# Patient Record
Sex: Male | Born: 1935 | Race: White | Hispanic: No | Marital: Married | Smoking: Never smoker
Health system: Southern US, Community
[De-identification: ages and names within clinical notes are randomized; demographics above are authoritative.]

## PROBLEM LIST (undated history)

## (undated) DIAGNOSIS — J449 Chronic obstructive pulmonary disease, unspecified: Secondary | ICD-10-CM

## (undated) DIAGNOSIS — J479 Bronchiectasis, uncomplicated: Secondary | ICD-10-CM

---

## 1997-08-27 HISTORY — PX: CORONARY STENT PLACEMENT: SHX1402

## 1998-05-07 ENCOUNTER — Encounter: Payer: Self-pay | Admitting: Cardiovascular Disease

## 1998-05-07 ENCOUNTER — Inpatient Hospital Stay: Admission: RE | Admit: 1998-05-07 | Discharge: 1998-05-09 | Payer: Self-pay | Admitting: Cardiovascular Disease

## 1998-07-25 ENCOUNTER — Ambulatory Visit (HOSPITAL_COMMUNITY): Admission: RE | Admit: 1998-07-25 | Discharge: 1998-07-25 | Payer: Self-pay | Admitting: Gastroenterology

## 1998-08-27 HISTORY — PX: CHOLECYSTECTOMY: SHX55

## 2000-07-02 ENCOUNTER — Encounter (INDEPENDENT_AMBULATORY_CARE_PROVIDER_SITE_OTHER): Payer: Self-pay

## 2000-07-02 ENCOUNTER — Ambulatory Visit (HOSPITAL_COMMUNITY): Admission: RE | Admit: 2000-07-02 | Discharge: 2000-07-02 | Payer: Self-pay | Admitting: Gastroenterology

## 2007-09-15 ENCOUNTER — Ambulatory Visit: Payer: Self-pay | Admitting: Internal Medicine

## 2008-06-11 ENCOUNTER — Ambulatory Visit: Payer: Self-pay | Admitting: Internal Medicine

## 2008-06-14 ENCOUNTER — Inpatient Hospital Stay (HOSPITAL_COMMUNITY): Admission: EM | Admit: 2008-06-14 | Discharge: 2008-06-15 | Payer: Self-pay | Admitting: Emergency Medicine

## 2008-06-24 ENCOUNTER — Observation Stay: Payer: Self-pay | Admitting: Internal Medicine

## 2008-08-02 ENCOUNTER — Inpatient Hospital Stay: Payer: Self-pay | Admitting: Internal Medicine

## 2008-12-20 ENCOUNTER — Ambulatory Visit: Payer: Self-pay | Admitting: Internal Medicine

## 2010-04-03 ENCOUNTER — Ambulatory Visit: Payer: Self-pay | Admitting: Internal Medicine

## 2010-09-04 ENCOUNTER — Ambulatory Visit: Payer: Self-pay | Admitting: Otolaryngology

## 2010-09-07 ENCOUNTER — Ambulatory Visit: Payer: Self-pay | Admitting: Otolaryngology

## 2010-09-09 LAB — PATHOLOGY REPORT

## 2011-01-09 NOTE — Cardiovascular Report (Signed)
NAME:  George Hensley, WILLCUTT NO.:  [id number]   MEDICAL RECORD NO.:  [id number]          PATIENT TYPE:  INP   LOCATION:  3739                         FACILITY:  [facility]   PHYSICIAN:  Nicki Guadalajara, M.D.     DATE OF BIRTH:  [birth year]   DATE OF PROCEDURE:  06/14/2008  DATE OF DISCHARGE:                            CARDIAC CATHETERIZATION   HISTORY:  Scout Gumbs is a 77 year old patient of Dr. Freda Munro,  who in 1999 underwent stenting of his proximal LAD.  At that time,  presenting symptoms were that of predominant shortness of breath.  He  does have a history of type 2 diabetes mellitus, hypertension,  dyslipidemia, and recently saw Dr. Freda Munro, was treated for  URI/bronchitis.  The patient has noticed progressive exertional dyspnea  suggestive of his anginal equivalent.  He saw Dr. Mariah Milling on Friday in  our [place] office after having seen Dr. Welton Flakes the day previously.  At  that time, he was started on low dose nitrates.  He presents to the  hospital today for cardiac catheterization.   PROCEDURE:  After premedication with Valium 5 mg intravenous, the  patient was prepped and draped in usual fashion.  His right femoral  artery was punctured anteriorly and a 5-French sheath was inserted.  Diagnostic catheterization was done utilizing 5-French Judkins for left  and right coronary catheters.  A 200 mcg intracoronary nitroglycerin was  administered down the left coronary system.  A pigtail catheter was used  for biplane selective coronary arteriography as well as distal  aortography.  Hemostasis was obtained by direct manual pressure.  The  patient tolerated the procedure well.   HEMODYNAMIC DATA:  Central aortic pressure is 115/59.  Left ventricular  pressure is 115/40.   ANGIOGRAPHIC DATA:  Left main coronary artery was angiographically  normal, bifurcated into an LAD and left circumflex system.   The proximal LAD had smooth narrowing of 20% before the first  septal  perforating artery.  There is a widely patent stent in the LAD between  the septal perforating artery and ending proximal to a small diagonal  branch.  There was 80%-90% mid stenosis in the small diagonal vessel  which was at most 2.0-2.1 mm in size.  The remainder of the LAD was free  of significant disease.   The circumflex vessel gave rise to a very small first high marginal  vessel which almost had a ramus intermediate like distribution.  This  was small-caliber, had 70% proximal stenosis.  The circumflex gave rise  to two additional marginal vessels which were free of significant  disease.   The right coronary artery was moderate sized vessel that had 50% focal  narrowing before anterior RV marginal branch.  The RCA ended in the PDA,  inferior LV branch, and posterolateral vessel.   Biplane selective coronary arteriography revealed preserved global  contractility.  However, there was definite mitral valve prolapse.  In  addition, there was focal area of mid-to-basal inferior  hypocontractility on the RAO projection and hypocontractility involving  the low inferolateral wall near the apical inferolateral wall.  Distal aortography revealed a tortuous infrarenal aorta without evidence  of renal artery stenosis or significant obstructive disease.   IMPRESSION:  1. Preserved global contractility, but with definite focal mid-to-      basal inferior hypocontractility as well as apical inferolateral      hypocontractility and evidence for angiographic mitral valve      prolapse.  2. A 20% narrowing in the proximal left anterior descending with      evidence for widely patent stent previously placed in 1999 in the      left anterior descending between the septal and diagonal vessel      with new 80%-90% stenosis in the midportion of a small diagonal      vessel of the left anterior descending.  3. A 70% narrowing in a small high marginal branch of the left      circumflex  system.  4. A 50% narrowing in a large right coronary artery.   RECOMMENDATIONS:  Mr. Mazor will be treated with initial increased  medical therapy.  Once significantly titrated, he will then undergo  nuclear stress testing to assess potential for ongoing ischemia on  medical therapy.           ______________________________  Nicki Guadalajara, M.D.     TK/MEDQ  D:  06/14/2008  T:  06/15/2008  Job:  [id number]   cc:   Gean Maidens, MD

## 2011-01-12 NOTE — Discharge Summary (Signed)
NAME:  George Hensley, George Hensley NO.:  [id number]   MEDICAL RECORD NO.:  [id number]          PATIENT TYPE:  INP   LOCATION:  3739                         FACILITY:  [facility]   PHYSICIAN:  Nicki Guadalajara, M.D.     DATE OF BIRTH:  [birth year]   DATE OF ADMISSION:  06/14/2008  DATE OF DISCHARGE:  06/15/2008                               DISCHARGE SUMMARY   DISCHARGE DIAGNOSES:  1. Dyspnea on exertion, worrisome for anginal equivalent,      catheterization on this admission revealing distal diagonal      disease, to be treated medically with a patent left anterior      descending stent.  2. Known coronary disease with prior left anterior descending stenting      in 1999.  3. Resolving upper respiratory infection.  4. Non-insulin-dependent diabetes.  5. Treated dyslipidemia.  6. Treated hypertension with normal renal arteries.   HOSPITAL COURSE:  The patient is a 77 year old male who had seen Dr.  Mariah Milling as an outpatient on June 11, 2008.  She had been having  dyspnea on exertion that was worrisome for anginal equivalent.  She also  had an abnormal EKG with new poor anterior R-wave progression.  He was  admitted for catheterization and further evaluation.  He was having  continued symptoms from an episode of bronchitis.  We added Advair and  Xopenex.  Nitrates and Plavix have been added on June 11, 2008.  He  was cathed on June 14, 2008, which revealed a 50% RCA, 70% small  ramus, patent LAD stent with a 80-90% small second diagonal.  LV  function was preserved with mitral valve prolapse noted.  There was  normal renal arteries and normal iliacs.  Plan was for continued medical  therapy and outpatient echo and Myoview and then a followup with Dr.  Tresa Endo.  Dr. Tresa Endo felt the patient could be discharged on June 15, 2008.   LABORATORY DATA:  EKG shows sinus rhythm without acute changes.  White  count 5.9, hemoglobin 12.5, hematocrit 37.4, platelets 130.  INR 1.0.  Sodium 129, potassium 4.1, BUN 20, creatinine 0.9.  Liver functions are  normal except for a slightly elevated bilirubin of 1.8.  Hemoglobin A1c  was 8.4.  CK-MB and troponin are negative x2.  Lipids show a cholesterol  of 141, HDL 70, LDL 60.   DISCHARGE MEDICATIONS:  1. Aspirin 325 mg a day.  2. She will continue her prednisone taper as directed.  3. She will continue her azithromycin as directed.  4. Simvastatin 20 mg a day.  5. Diltiazem, as taken at home, 120 mg a day.  6. Glipizide 10 mg a day.  7. Flomax 0.4 mg a day.  8. Finasteride 5 mg a day.  9. Advair Diskus 250/50 b.i.d.  10.Altace 2.5 mg a day.  11.Imdur 60 mg a day.  12.Toprol-XL 25 mg a day.  13.Nitroglycerin sublingual p.r.n.   DISPOSITION:  The patient is discharged in stable condition and will  follow up with Dr. Tresa Endo after an outpatient echocardiogram and Myoview.      Franky Macho  Leonor Liv, P.A.    ______________________________  Nicki Guadalajara, M.D.    Lenard Lance  D:  08/12/2008  T:  08/12/2008  Job:  [id number]   cc:   Freda Munro

## 2011-02-22 ENCOUNTER — Institutional Professional Consult (permissible substitution): Payer: Self-pay | Admitting: Internal Medicine

## 2011-03-05 ENCOUNTER — Institutional Professional Consult (permissible substitution): Payer: Self-pay | Admitting: Internal Medicine

## 2011-03-06 ENCOUNTER — Ambulatory Visit (INDEPENDENT_AMBULATORY_CARE_PROVIDER_SITE_OTHER): Payer: Medicare Other | Admitting: Internal Medicine

## 2011-03-06 ENCOUNTER — Ambulatory Visit (INDEPENDENT_AMBULATORY_CARE_PROVIDER_SITE_OTHER)
Admission: RE | Admit: 2011-03-06 | Discharge: 2011-03-06 | Disposition: A | Discharge status: Home / Self Care | Payer: Medicare Other | Source: Ambulatory Visit | Attending: Internal Medicine | Admitting: Internal Medicine

## 2011-03-06 ENCOUNTER — Encounter: Payer: Self-pay | Admitting: Internal Medicine

## 2011-03-06 ENCOUNTER — Telehealth: Payer: Self-pay | Admitting: *Deleted

## 2011-03-06 VITALS — BP 108/66 | HR 74 | Temp 98.1°F | Ht 71.0 in | Wt 179.6 lb

## 2011-03-06 DIAGNOSIS — J984 Other disorders of lung: Secondary | ICD-10-CM

## 2011-03-06 DIAGNOSIS — R059 Cough, unspecified: Secondary | ICD-10-CM

## 2011-03-06 DIAGNOSIS — R05 Cough: Secondary | ICD-10-CM

## 2011-03-06 DIAGNOSIS — R911 Solitary pulmonary nodule: Secondary | ICD-10-CM | POA: Insufficient documentation

## 2011-03-06 MED ORDER — AMOXICILLIN-POT CLAVULANATE 875-125 MG PO TABS: 1.0000 | ORAL_TABLET | Freq: Two times a day (BID) | ORAL | Status: AC

## 2011-03-06 MED ORDER — PREDNISONE (PAK) 10 MG PO TABS: ORAL_TABLET | ORAL | Status: AC

## 2011-03-06 MED ORDER — MOMETASONE FURO-FORMOTEROL FUM 100-5 MCG/ACT IN AERO: 2.0000 | INHALATION_SPRAY | Freq: Two times a day (BID) | RESPIRATORY_TRACT | Status: DC

## 2011-03-06 NOTE — Telephone Encounter (Signed)
LUL pulmonary nodule.  Nonspecific in appearance.  Recommend further evaluation with CT of the chest.

## 2011-03-06 NOTE — Progress Notes (Signed)
Subjective:     Patient ID: George Hensley, male   DOB: 18-Apr-1931, 77 y.o.   MRN: [id number]  HPI  58 yowm never smoked with onset of short of breath assoc with sinus drainage and infections starting around early 2000's referred to this clinic by Dr Chestine Spore, ENT [facility]   03/06/2011 ov/ George Hensley cc cough x one year gradually worse, day more night and does not wake up with it,  A lot better p sinus surgery for about a month then gradually worse since in terms of frequency and severity.  Sputum is light yellow.  No better on advair, spiriva, dulera.  Mild doe yardwork in hot weather.   Sleeping ok without nocturnal  or early am exac of resp c/o's or need for noct saba. Pt denies any significant sore throat, dysphagia, itching, sneezing,  nasal congestion or excess/ purulent secretions,  fever, chills, sweats, unintended wt loss, pleuritic or exertional cp, hempoptysis, orthopnea pnd or leg swelling.    Also denies any obvious fluctuation of symptoms with weather or environmental changes or other aggravating or alleviating factors.    Review of Systems  Constitutional: Negative for fever, chills, activity change, appetite change and unexpected weight change.  HENT: Negative for congestion, sore throat, rhinorrhea, sneezing, trouble swallowing, dental problem, voice change and postnasal drip.   Eyes: Negative for visual disturbance.  Respiratory: Positive for cough and shortness of breath. Negative for choking.   Cardiovascular: Negative for chest pain and leg swelling.  Gastrointestinal: Negative for nausea, vomiting and abdominal pain.  Genitourinary: Negative for difficulty urinating.  Musculoskeletal: Negative for arthralgias.  Skin: Negative for rash.  Psychiatric/Behavioral: Negative for behavioral problems and confusion.       Objective:   Physical Exam amb wm nad  Wt 179 03/06/2011  HEENT: nl dentition, turbinates, and orophanx. Nl external ear canals without cough reflex   NECK :   without JVD/Nodes/TM/ nl carotid upstrokes bilaterally   LUNGS: no acc muscle use, end exp bilateral wheeze without cough on insp or exp maneuvers   CV:  RRR  no s3 or murmur or increase in P2, no edema   ABD:  soft and nontender with nl excursion in the supine position. No bruits or organomegaly, bowel sounds nl  MS:  warm without deformities, calf tenderness, cyanosis or clubbing  SKIN: warm and dry without lesions    NEURO:  alert, approp, no deficits   cxr 03/06/2011  1. Left upper lobe pulmonary nodule has a nonspecific appearance.  Recommend further evaluation with CT of the chest.     Assessment:          Plan:

## 2011-03-06 NOTE — Patient Instructions (Addendum)
dulera 100  Take 2 puffs first thing in am and then another 2 puffs about 12 hours later.     Work on inhaler technique:  relax and gently blow all the way out then take a nice smooth deep breath back in, triggering the inhaler at same time you start breathing in.  Hold for up to 5 seconds if you can.  Rinse and gargle with water when done   If your mouth or throat starts to bother you,   I suggest you time the inhaler to your dental care and after using the inhaler(s) brush teeth and tongue with a baking soda containing toothpaste and when you rinse this out, gargle with it first to see if this helps your mouth and throat.    Augmentin x 10 days and Prednisone 10 mg take  4 each am x 2 days,   2 each am x 2 days,  1 each am x2days and stop  Please schedule a follow up office visit in 4 weeks, sooner if needed with PFT's   ADD discuss f/u CT at [facility] on f/u ov in 4 weeks

## 2011-03-06 NOTE — Assessment & Plan Note (Signed)
This is a never smoker and most likely cxr reflects the chronic upper lobe GG patchy fibrotic changes seen on CT 03/2010 ? Significant so best to repeat apples to apples CT in [place] in August

## 2011-03-06 NOTE — Assessment & Plan Note (Signed)
The most common causes of chronic cough in immunocompetent adults include the following: upper airway cough syndrome (UACS), previously referred to as postnasal drip syndrome (PNDS), which is caused by variety of rhinosinus conditions; (2) asthma; (3) GERD; (4) chronic bronchitis from cigarette smoking or other inhaled environmental irritants; (5) nonasthmatic eosinophilic bronchitis; and (6) bronchiectasis.   These conditions, singly or in combination, have accounted for up to 94% of the causes of chronic cough in prospective studies.   Other conditions have constituted no >6% of the causes in prospective studies These have included bronchogenic carcinoma, chronic interstitial pneumonia, sarcoidosis, left ventricular failure, ACEI-induced cough, and aspiration from a condition associated with pharyngeal dysfunction.   Most likely this is  Classic Upper airway cough syndrome, so named because it's frequently impossible to sort out how much is  CR/sinusitis with freq throat clearing (which can be related to primary GERD)   vs  causing  secondary (" extra esophageal")  GERD from wide swings in gastric pressure that occur with throat clearing, often  promoting self use of mint and menthol lozenges that reduce the lower esophageal sphincter tone and exacerbate the problem further in a cyclical fashion.   These are the same pts who not infrequently have failed to tolerate ace inhibitors,  dry powder inhalers or biphosphonates or report having reflux symptoms that don't respond to standard doses of PPI , and are easily confused as having aecopd or asthma flares,   .Chronic cough is often simultaneously caused by more than one condition. A single cause has been found from 38 to 82% of the time, multiple causes from 18 to 62%. Multiply caused cough has been the result of three diseases up to 42% of the time.    So could now also have asthma where advair not the best choice because cough is so promient and will  rechallenge with lose dose dulera.  The proper method of use, as well as anticipated side effects, of this metered-dose inhaler are discussed and demonstrated to the patient. impoved to 90% with coaching

## 2011-03-15 NOTE — Telephone Encounter (Signed)
MW has discussed with the patient.

## 2011-03-20 ENCOUNTER — Institutional Professional Consult (permissible substitution): Payer: Self-pay | Admitting: Internal Medicine

## 2011-04-04 ENCOUNTER — Ambulatory Visit (INDEPENDENT_AMBULATORY_CARE_PROVIDER_SITE_OTHER): Payer: Medicare Other | Admitting: Internal Medicine

## 2011-04-04 ENCOUNTER — Encounter: Payer: Self-pay | Admitting: Internal Medicine

## 2011-04-04 DIAGNOSIS — J45909 Unspecified asthma, uncomplicated: Secondary | ICD-10-CM | POA: Insufficient documentation

## 2011-04-04 DIAGNOSIS — J984 Other disorders of lung: Secondary | ICD-10-CM

## 2011-04-04 DIAGNOSIS — R911 Solitary pulmonary nodule: Secondary | ICD-10-CM

## 2011-04-04 DIAGNOSIS — R05 Cough: Secondary | ICD-10-CM

## 2011-04-04 DIAGNOSIS — R059 Cough, unspecified: Secondary | ICD-10-CM

## 2011-04-04 MED ORDER — MOMETASONE FURO-FORMOTEROL FUM 200-5 MCG/ACT IN AERO: INHALATION_SPRAY | RESPIRATORY_TRACT | Status: DC

## 2011-04-04 NOTE — Assessment & Plan Note (Signed)
Reviewed previous ct scans> this is likely all chronic scarring in this never smoker but will do serial plain cxr's to be sure (low risk does not justify high radiation exposure of serial ct's for multifocal abnormalities).  Placed in tickle file for recall

## 2011-04-04 NOTE — Patient Instructions (Addendum)
Increase dulera 200 Take 2 puffs first thing in am and then another 2 puffs about 12 hours later.   Please schedule a follow up office visit in 4 weeks, sooner if needed with cxr on return  NEEDS ALPHA ONE SCREEN NEXT OV

## 2011-04-04 NOTE — Assessment & Plan Note (Addendum)
Clearly this is most likely chronic asthma, poorly controlled  DDX of  difficult airways managment all start with A and  include Adherence, Ace Inhibitors, Acid Reflux, Active Sinus Disease, Alpha 1 Antitripsin deficiency, Anxiety masquerading as Airways dz,  ABPA,  allergy(esp in young), Aspiration (esp in elderly), Adverse effects of DPI,  Active smokers, plus two Bs  = Bronchiectasis and Beta blocker use..and one C= CHF   Adherence is always the initial "prime suspect" and is a multilayered concern that requires a "trust but verify" approach in every patient - starting with knowing how to use medications, especially inhalers, correctly, keeping up with refills and understanding the fundamental difference between maintenance and prns vs those medications only taken for a very short course and then stopped and not refilled. The proper method of use, as well as anticipated side effects, of this metered-dose inhaler are discussed and demonstrated to the patient. Improved to 90% p coaching  Needs alpha one antitrypsin testing next ov

## 2011-04-04 NOTE — Progress Notes (Signed)
PFT done today. 

## 2011-04-04 NOTE — Progress Notes (Signed)
Subjective:     Patient ID: George Hensley, male   DOB: 1931-03-29, 77 y.o.   MRN: [id number]  HPI  51 yowm only smoked a pipe and quit in the 1950s  with onset of short of breath assoc with sinus drainage and infections starting around early 2000's referred to this clinic by Dr Chestine Spore, ENT [facility]   03/06/2011 ov/ George Hensley cc cough x one year gradually worse, day more night and does not wake up with it,  A lot better p sinus surgery for about a month then gradually worse since in terms of frequency and severity.  Sputum is light yellow.  No better on advair, spiriva, dulera.  Mild doe yardwork in hot weather.  rec  dulera 100  Take 2 puffs first thing in am and then another 2 puffs about 12 hours later.  Work on inhaler technique:  Augmentin x 10 days and Prednisone x 6 days only   04/04/2011 ov/George Hensley cc overall some better,  mostly dry cough daytime,  Breathing acceptable though avoids heavy exertion, esp in hot weather.   Sleeping ok without nocturnal  or early am exac of resp c/o's - Pt denies any significant sore throat, dysphagia, itching, sneezing,  nasal congestion or excess/ purulent secretions,  fever, chills, sweats, unintended wt loss, pleuritic or exertional cp, hempoptysis, orthopnea pnd or leg swelling.    Also denies any obvious fluctuation of symptoms with weather or environmental changes or other aggravating or alleviating factors.          Objective:   Physical Exam amb wm nad  Wt 179 03/06/2011 >  04/04/2011  181 HEENT: nl dentition, turbinates, and orophanx. Nl external ear canals without cough reflex   NECK :  without JVD/Nodes/TM/ nl carotid upstrokes bilaterally   LUNGS: no acc muscle use, minimal end exap bilateral wheeze without cough on insp or exp maneuvers   CV:  RRR  no s3 or murmur or increase in P2, no edema   ABD:  soft and nontender with nl excursion in the supine position. No bruits or organomegaly, bowel sounds nl  MS:  warm without deformities,  calf tenderness, cyanosis or clubbing            Assessment:          Plan:

## 2011-05-01 ENCOUNTER — Telehealth: Payer: Self-pay | Admitting: Internal Medicine

## 2011-05-01 ENCOUNTER — Other Ambulatory Visit: Payer: Self-pay | Admitting: Internal Medicine

## 2011-05-01 DIAGNOSIS — R911 Solitary pulmonary nodule: Secondary | ICD-10-CM

## 2011-05-01 MED ORDER — MOMETASONE FURO-FORMOTEROL FUM 200-5 MCG/ACT IN AERO: INHALATION_SPRAY | RESPIRATORY_TRACT | Status: DC

## 2011-05-01 NOTE — Telephone Encounter (Signed)
Mr. Pruett returned call of Triage nurse.  [phone].  Antionette Fairy

## 2011-05-01 NOTE — Telephone Encounter (Signed)
Pt returning call can be reached at [phone].George Hensley

## 2011-05-01 NOTE — Telephone Encounter (Signed)
lmomtcb to verify message and ? If pt wants a 30 or 90 day rx.

## 2011-05-01 NOTE — Telephone Encounter (Signed)
Pt would like 30 day supply sent to pharmacy. rx has been sent and pt aware

## 2011-05-02 ENCOUNTER — Ambulatory Visit: Payer: Medicare Other | Admitting: Internal Medicine

## 2011-05-07 ENCOUNTER — Telehealth: Payer: Self-pay | Admitting: Internal Medicine

## 2011-05-07 NOTE — Telephone Encounter (Signed)
lmomtcb to advise no available openings until oct

## 2011-05-07 NOTE — Telephone Encounter (Signed)
Had a cancellation so called pt and rescheduled appt for tomorrow.George Hensley

## 2011-05-08 ENCOUNTER — Ambulatory Visit: Payer: Medicare Other | Admitting: Internal Medicine

## 2011-05-08 NOTE — Telephone Encounter (Signed)
CXR [place].  Set for 05/31/2011.  Pt declined available appts for today.  George Hensley

## 2011-05-09 ENCOUNTER — Ambulatory Visit: Payer: Medicare Other | Admitting: Internal Medicine

## 2011-05-29 LAB — LIPID PANEL
HDL: 70
Total CHOL/HDL Ratio: 2
Triglycerides: 57

## 2011-05-29 LAB — HEMOGLOBIN A1C: Hgb A1c MFr Bld: 8.4 — ABNORMAL HIGH

## 2011-05-29 LAB — BASIC METABOLIC PANEL
BUN: 20
Chloride: 101
Creatinine, Ser: 0.98
GFR calc Af Amer: >60
GFR calc non Af Amer: >60
Potassium: 4.1

## 2011-05-29 LAB — CK TOTAL AND CKMB (NOT AT ARMC): Total CK: 61

## 2011-05-29 LAB — CBC
HCT: 37.4 — ABNORMAL LOW
HCT: 43.7
Hemoglobin: 14.2
MCV: 94.8
Platelets: 130 — ABNORMAL LOW
RBC: 3.95 — ABNORMAL LOW
RBC: 4.54
WBC: 5.9
WBC: 8.4

## 2011-05-29 LAB — POCT I-STAT, CHEM 8
BUN: 27 — ABNORMAL HIGH
HCT: 44
Sodium: 138
TCO2: 30

## 2011-05-29 LAB — DIFFERENTIAL
Eosinophils Relative: 0
Lymphocytes Relative: 19
Lymphs Abs: 1.6
Monocytes Absolute: 0.7
Neutro Abs: 6.1

## 2011-05-29 LAB — GLUCOSE, CAPILLARY
Glucose-Capillary: 157 — ABNORMAL HIGH
Glucose-Capillary: 284 — ABNORMAL HIGH
Glucose-Capillary: 83

## 2011-05-29 LAB — COMPREHENSIVE METABOLIC PANEL
ALT: 15
Alkaline Phosphatase: 45
BUN: 25 — ABNORMAL HIGH
CO2: 29
Chloride: 104
GFR calc non Af Amer: >60
Glucose, Bld: 206 — ABNORMAL HIGH
Potassium: 4.2
Total Bilirubin: 1.8 — ABNORMAL HIGH

## 2011-05-29 LAB — B-NATRIURETIC PEPTIDE (CONVERTED LAB): Pro B Natriuretic peptide (BNP): 64

## 2011-05-29 LAB — POCT CARDIAC MARKERS
CKMB, poc: <1 — ABNORMAL LOW
Myoglobin, poc: 119

## 2011-05-29 LAB — PROTIME-INR: Prothrombin Time: 13

## 2011-05-29 LAB — MAGNESIUM: Magnesium: 2.3

## 2011-05-31 ENCOUNTER — Ambulatory Visit (INDEPENDENT_AMBULATORY_CARE_PROVIDER_SITE_OTHER): Payer: Medicare Other | Admitting: Internal Medicine

## 2011-05-31 ENCOUNTER — Ambulatory Visit (INDEPENDENT_AMBULATORY_CARE_PROVIDER_SITE_OTHER)
Admission: RE | Admit: 2011-05-31 | Discharge: 2011-05-31 | Disposition: A | Discharge status: Home / Self Care | Payer: Medicare Other | Source: Ambulatory Visit | Attending: Internal Medicine | Admitting: Internal Medicine

## 2011-05-31 ENCOUNTER — Encounter: Payer: Self-pay | Admitting: Internal Medicine

## 2011-05-31 DIAGNOSIS — J45909 Unspecified asthma, uncomplicated: Secondary | ICD-10-CM

## 2011-05-31 DIAGNOSIS — R911 Solitary pulmonary nodule: Secondary | ICD-10-CM

## 2011-05-31 DIAGNOSIS — Z23 Encounter for immunization: Secondary | ICD-10-CM

## 2011-05-31 DIAGNOSIS — J984 Other disorders of lung: Secondary | ICD-10-CM

## 2011-05-31 MED ORDER — PREDNISONE (PAK) 10 MG PO TABS: ORAL_TABLET | ORAL | Status: AC

## 2011-05-31 MED ORDER — AMOXICILLIN-POT CLAVULANATE 875-125 MG PO TABS: 1.0000 | ORAL_TABLET | Freq: Two times a day (BID) | ORAL | Status: AC

## 2011-05-31 NOTE — Patient Instructions (Addendum)
Augmentin 875 mg twice daily x 21 days   Prednisone 10 mg take  4 each am x 2 days,   2 each am x 2 days,  1 each am x2days and stop   Ok to stop dulera since not helping   If not 100% satisfied or if cxr is abnormal then I recommend a sinus ct and chest ct all to be done at the same time.

## 2011-05-31 NOTE — Progress Notes (Signed)
Subjective:     Patient ID: George Hensley, male   DOB: Dec 07, 1930, 77 y.o.   MRN: [id number]  HPI  37 yowm only smoked a pipe and quit in the 1950s  with onset of short of breath assoc with sinus drainage and infections starting around early 2000's referred to this clinic by Dr Chestine Spore, ENT [facility]   03/06/2011 ov/ [name] cc cough x one year gradually worse, day more night and does not wake up with it,  A lot better p sinus surgery for about a month then gradually worse since in terms of frequency and severity.  Sputum is light yellow.  No better on advair, spiriva, dulera.  Mild doe yardwork in hot weather.  rec  dulera 100  Take 2 puffs first thing in am and then another 2 puffs about 12 hours later.  Work on inhaler technique:  Augmentin x 10 days and Prednisone x 6 days only   04/04/2011 ov/[name] cc overall some better,  mostly dry cough daytime,  Breathing acceptable though avoids heavy exertion, esp in hot weather.  rec Increase dulera 200 Take 2 puffs first thing in am and then another 2 puffs about 12 hours later.      05/31/2011 f/u ov/[name] cc no better on higher dose  dulera, still with congested cough with thick slt discolored am sputum, only transiently better p rx of sinus dz. Nothing else worked.   Sleeping ok without nocturnal  or early am exac of resp c/o's - Pt denies any significant sore throat, dysphagia, itching, sneezing,  nasal congestion or excess/ purulent secretions,  fever, chills, sweats, unintended wt loss, pleuritic or exertional cp, hempoptysis, orthopnea pnd or leg swelling.    Also denies any obvious fluctuation of symptoms with weather or environmental changes or other aggravating or alleviating factors.          Objective:   Physical Exam  amb wm nad   Wt 179 03/06/2011 >  04/04/2011  181 > 183 05/31/2011   HEENT: nl dentition, turbinates, and orophanx. Nl external ear canals without cough reflex   NECK :  without JVD/Nodes/TM/ nl carotid upstrokes  bilaterally   LUNGS: no acc muscle use, minimal end exap bilateral wheeze without cough on insp or exp maneuvers   CV:  RRR  no s3 or murmur or increase in P2, no edema   ABD:  soft and nontender with nl excursion in the supine position. No bruits or organomegaly, bowel sounds nl  MS:  warm without deformities, calf tenderness, cyanosis or clubbing        CXR  05/31/2011  Suggested interval decrease in size of previously demonstrated left upper lobe nodule. Continued follow-up is suggested to exclude the possibility that this suggested change is technical. 2. No acute findings identified.        Assessment:          Plan:

## 2011-05-31 NOTE — Assessment & Plan Note (Signed)
Not convinced he's any better on max dulera so try off it and rx with 21days augmentin then check sinus ct and chest ct if not better.

## 2011-05-31 NOTE — Assessment & Plan Note (Signed)
Discussed in detail all the  indications, usual  risks and alternatives  relative to the benefits with patient who agrees to proceed with conservative f/u as long as improving symptoms.  If worsens off rx needs quant Ig's,  Esr, cbc with diff and allergy profile

## 2011-06-20 ENCOUNTER — Encounter: Payer: Self-pay | Admitting: Internal Medicine

## 2011-07-02 ENCOUNTER — Telehealth: Payer: Self-pay | Admitting: Internal Medicine

## 2011-07-02 MED ORDER — MOMETASONE FURO-FORMOTEROL FUM 200-5 MCG/ACT IN AERO: 2.0000 | INHALATION_SPRAY | Freq: Two times a day (BID) | RESPIRATORY_TRACT | Status: DC

## 2011-07-02 NOTE — Telephone Encounter (Signed)
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 LMTCB.

## 2011-07-02 NOTE — Telephone Encounter (Signed)
Refill sent. Pt aware.George Hensley, CMA  

## 2011-07-02 NOTE — Telephone Encounter (Signed)
Ok to rx with [facility]

## 2011-07-02 NOTE — Telephone Encounter (Signed)
Called, spoke with pt. 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 He verbalized understanding of this.  Requesting dulera rx however at last OV with MW on 05/31/11 - he was instructed it was  Ok to stop dulera since not helping.  Pt states he does actually think this is helping because he feels he breaths better while on it.  He is using 2 puffs bid.  Requesting rx.  [facility] [address] in [place].  Dr. Sherene Sires, pls advise if ok for dulera rx.

## 2011-07-06 ENCOUNTER — Encounter: Payer: Self-pay | Admitting: Internal Medicine

## 2013-09-21 ENCOUNTER — Observation Stay: Payer: Self-pay | Admitting: Internal Medicine

## 2013-09-21 DIAGNOSIS — I519 Heart disease, unspecified: Secondary | ICD-10-CM

## 2013-09-21 LAB — CBC WITH DIFFERENTIAL/PLATELET
BASOS PCT: 0.4 %
Basophil #: 0 10*3/uL (ref 0.0–0.1)
EOS PCT: 0.6 %
Eosinophil #: 0 10*3/uL (ref 0.0–0.7)
HCT: 40.4 % (ref 40.0–52.0)
HGB: 13.3 g/dL (ref 13.0–18.0)
Lymphocyte #: 0.6 10*3/uL — ABNORMAL LOW (ref 1.0–3.6)
Lymphocyte %: 14.6 %
MCH: 32 pg (ref 26.0–34.0)
MCHC: 33 g/dL (ref 32.0–36.0)
MCV: 97 fL (ref 80–100)
Monocyte #: 0.6 x10 3/mm (ref 0.2–1.0)
Monocyte %: 14.8 %
NEUTROS ABS: 2.9 10*3/uL (ref 1.4–6.5)
NEUTROS PCT: 69.6 %
PLATELETS: 118 10*3/uL — AB (ref 150–440)
RBC: 4.17 10*6/uL — AB (ref 4.40–5.90)
RDW: 14.2 % (ref 11.5–14.5)
WBC: 4.1 10*3/uL (ref 3.8–10.6)

## 2013-09-21 LAB — PROTIME-INR
INR: 1
Prothrombin Time: 12.7 secs (ref 11.5–14.7)

## 2013-09-21 LAB — URINALYSIS, COMPLETE
BILIRUBIN, UR: NEGATIVE
Bacteria: NONE SEEN
Blood: NEGATIVE
GLUCOSE, UR: NEGATIVE mg/dL (ref 0–75)
KETONE: NEGATIVE
Leukocyte Esterase: NEGATIVE
Nitrite: POSITIVE
Ph: 6 (ref 4.5–8.0)
Protein: NEGATIVE
RBC,UR: 5 /HPF (ref 0–5)
SPECIFIC GRAVITY: 1.015 (ref 1.003–1.030)
Squamous Epithelial: 1
WBC UR: 2 /HPF (ref 0–5)

## 2013-09-21 LAB — COMPREHENSIVE METABOLIC PANEL
ALBUMIN: 3.2 g/dL — AB (ref 3.4–5.0)
ALK PHOS: 42 U/L — AB
ANION GAP: 4 — AB (ref 7–16)
AST: 43 U/L — AB (ref 15–37)
BILIRUBIN TOTAL: 0.7 mg/dL (ref 0.2–1.0)
BUN: 14 mg/dL (ref 7–18)
CHLORIDE: 105 mmol/L (ref 98–107)
CO2: 28 mmol/L (ref 21–32)
CREATININE: 0.78 mg/dL (ref 0.60–1.30)
Calcium, Total: 8 mg/dL — ABNORMAL LOW (ref 8.5–10.1)
EGFR (African American): >60
Glucose: 83 mg/dL (ref 65–99)
OSMOLALITY: 273 (ref 275–301)
Potassium: 4.6 mmol/L (ref 3.5–5.1)
SGPT (ALT): 30 U/L (ref 12–78)
Sodium: 137 mmol/L (ref 136–145)
Total Protein: 6.2 g/dL — ABNORMAL LOW (ref 6.4–8.2)

## 2013-09-21 LAB — CK TOTAL AND CKMB (NOT AT ARMC)
CK, TOTAL: 146 U/L (ref 35–232)
CK-MB: 1.7 ng/mL (ref 0.5–3.6)

## 2013-09-21 LAB — TROPONIN I

## 2013-09-21 LAB — APTT: ACTIVATED PTT: 27.8 s (ref 23.6–35.9)

## 2014-04-20 ENCOUNTER — Ambulatory Visit: Payer: Self-pay | Admitting: Internal Medicine

## 2014-05-16 ENCOUNTER — Emergency Department: Payer: Self-pay | Admitting: Emergency Medicine

## 2014-05-16 LAB — URINALYSIS, COMPLETE
BACTERIA: NONE SEEN
Bilirubin,UR: NEGATIVE
NITRITE: POSITIVE
PH: 5 (ref 4.5–8.0)
RBC,UR: 11 /HPF (ref 0–5)
Specific Gravity: 1.02 (ref 1.003–1.030)
Squamous Epithelial: 2
WBC UR: 648 /HPF (ref 0–5)

## 2014-05-18 ENCOUNTER — Emergency Department: Payer: Self-pay | Admitting: Emergency Medicine

## 2014-05-18 LAB — BASIC METABOLIC PANEL
Anion Gap: 10 (ref 7–16)
BUN: 16 mg/dL (ref 7–18)
CALCIUM: 8.3 mg/dL — AB (ref 8.5–10.1)
CREATININE: 0.96 mg/dL (ref 0.60–1.30)
Chloride: 101 mmol/L (ref 98–107)
Co2: 24 mmol/L (ref 21–32)
Glucose: 199 mg/dL — ABNORMAL HIGH (ref 65–99)
OSMOLALITY: 277 (ref 275–301)
POTASSIUM: 3.6 mmol/L (ref 3.5–5.1)
SODIUM: 135 mmol/L — AB (ref 136–145)

## 2014-05-18 LAB — CBC
HCT: 34.7 % — AB (ref 40.0–52.0)
HGB: 11.1 g/dL — ABNORMAL LOW (ref 13.0–18.0)
MCH: 30.2 pg (ref 26.0–34.0)
MCHC: 32 g/dL (ref 32.0–36.0)
MCV: 94 fL (ref 80–100)
PLATELETS: 217 10*3/uL (ref 150–440)
RBC: 3.68 10*6/uL — AB (ref 4.40–5.90)
RDW: 15.2 % — ABNORMAL HIGH (ref 11.5–14.5)
WBC: 11 10*3/uL — ABNORMAL HIGH (ref 3.8–10.6)

## 2014-05-26 ENCOUNTER — Ambulatory Visit: Payer: Self-pay | Admitting: Urology

## 2014-06-01 ENCOUNTER — Ambulatory Visit: Payer: Self-pay | Admitting: Urology

## 2014-12-18 NOTE — H&P (Signed)
PATIENT NAME:  George Hensley, George Hensley MR#:  [id number] DATE OF BIRTH:  [birth year]  DATE OF ADMISSION:  06/01/2014  CHIEF COMPLAINT: Urinary retention.   HISTORY OF PRESENT ILLNESS: George Hensley is an 81 year old Caucasian male who developed acute urinary retention at the end of September and has had a catheter in place. He has a long history of BPH dating back at least 20 years. He had been on two-drug therapy with tamsulosin 0.4 mg per day and finasteride 5 mg per day. He comes in now for photovaporization of the prostate with a greenlight laser.   ALLERGIES: No known drug allergies.    CURRENT MEDICATIONS: Include aspirin, Cardizem, finasteride, Flonase, glyburide, metformin, pravastatin, Spiriva, tamsulosin.   PAST SURGICAL HISTORY: Coronary artery stent placement in 1989.   PAST AND CURRENT MEDICAL CONDITIONS:  1.  Type 2 diabetes.  2.  Asthma.  3.  Coronary artery disease.  4.  History of TIA   REVIEW OF SYSTEMS: The patient denied chest pain, shortness of breath, stroke or hypertension.   PHYSICAL EXAMINATION: GENERAL: An elderly white male in no acute distress.  EYES: Sclerae were clear. Pupils were equally round and reactive to light. Extraocular movements are intact.  NECK: Supple. No palpable cervical adenopathy. No audible carotid bruits.  LUNGS: Clear to auscultation.  CARDIOVASCULAR: Regular rhythm and rate without audible murmurs.  ABDOMEN: Soft, nontender abdomen.  GENITOURINARY: Circumcised. Testes smooth and nontender, atrophic, 16 mL in size each.  RECTAL: 40 gram, smooth nontender prostate.  NEUROMUSCULAR: Alert and oriented x3.   IMPRESSION: 1.  Urinary retention.  2.  Benign prostatic hypertrophy with bladder outlet obstruction.   PLAN: Photovaporization of the prostate with the green light laser.  ____________________________ Suszanne ConnersMichael R. Evelene CroonWolff, MD mrw:sb D: 05/27/2014 08:52:24 ET T: 05/27/2014 09:56:53 ET JOB#: [id number]  cc: Suszanne ConnersMichael R. Evelene CroonWolff, MD,  <Dictator> Orson ApeMICHAEL R Jamair Cato MD ELECTRONICALLY SIGNED 05/27/2014 15:01

## 2014-12-18 NOTE — Op Note (Signed)
PATIENT NAME:  George Hensley, George Hensley MR#:  [id number] DATE OF BIRTH:  1930/11/18  DATE OF PROCEDURE:  06/01/2014  PREOPERATIVE DIAGNOSES: 1.  Urinary retention.  2.  Benign prostatic hypertrophy.   POSTOPERATIVE DIAGNOSES: 1.  Urinary retention.  2.  Benign prostatic hypertrophy.   PROCEDURE:  Photovaporization of the prostate with GreenLight laser.   SURGEON:  Anola GurneyMichael Wolff, MD   ANESTHETIST:  Currie ParisJames G. Pernell DupreAdams, MD   ANESTHETIC METHOD:  General.   INDICATIONS:  See the dictated history and physical. After informed consent, the patient requests the above procedure.   OPERATIVE SUMMARY:  After adequate general anesthesia had been obtained, the patient was placed into dorsal lithotomy position, and the perineum was prepped and draped in the usual fashion. The laser scope was then coupled with a camera and then visually advanced into the bladder. The bladder was heavily trabeculated with a few cellules present. No bladder mucosal lesions were identified. Both ureteral orifices were identified and had clear efflux. The patient had trilobar BPH with visual obstruction. At this point, the GreenLight laser XPS fiber was introduced through the scope and set at 80 watts of power. Bladder neck tissue and median lobe were then vaporized. Power was then increased to 120 watts and remaining obstructive tissue from the bladder neck to the verumontanum was vaporized. Finally, the power was increased to 180 watts, and remaining obstructive tissue was vaporized. At this point, the scope was removed. Viscous Xylocaine 10 mL was instilled within the urethra and the bladder. A 20-French silicone catheter was placed. The catheter was irrigated until clear. B and O suppository was placed. The procedure was then terminated, and the patient was transferred to the recovery room in stable condition.    ____________________________ Suszanne ConnersMichael R. Evelene CroonWolff, MD mrw:nb D: 06/01/2014 16:32:35 ET T: 06/02/2014 02:01:43  ET JOB#: [id number]  cc: Suszanne ConnersMichael R. Evelene CroonWolff, MD, <Dictator> Orson ApeMICHAEL R WOLFF MD ELECTRONICALLY SIGNED 06/02/2014 8:09

## 2014-12-18 NOTE — Discharge Summary (Signed)
PATIENT NAME:  George Hensley, George Hensley MR#:  [id number] DATE OF BIRTH:  06-16-1931  DATE OF ADMISSION:  09/21/2013 DATE OF DISCHARGE:  09/21/2013  DISCHARGE DIAGNOSIS: Transient ischemic attack, changing baby aspirin to full dose aspirin now with all the neuro workup bCourtney Pariseing negative in the hospital.  SECONDARY DIAGNOSES: 1.  Diabetes.  2.  Asthma.  3.  Coronary artery disease.   CONSULTATIONS: Neurology, Dr. Cristopher PeruHemang Nedda Gains.   PROCEDURES AND RADIOLOGY: CT scan of the head without contrast on the 26th of January showed no acute intracranial pathology. Findings consistent with chronic small vessel ischemic disease.   Chest x-ray on the 26th of January showed no acute cardiopulmonary disease.   MRI of the brain without contrast on the 26th of January showed atrophy and chronic microvascular ischemia. No acute infarct.   Bilateral carotid Dopplers on the 26th of January showed bilateral carotid bifurcation and proximal ICA plaque resulting in less than 50% diameter stenosis.   A 2-D echocardiogram on the 26th of January showed LVEF of 40% to 45%. Mildly decreased global LV systolic function. Impaired relaxation pattern of LV diastolic filling. Mildly increased left ventricular internal cavity size. Moderately increased LV posterior wall thickness.   LABORATORY DATA: UA on admission was negative.   HISTORY AND SHORT HOSPITAL COURSE: The patient is an 81 year old male with above-mentioned medical problems who was admitted with symptoms of TIA with difficulty getting words out, which was resolved while he came to the Emergency Department. Please see Dr. Margaretmary EddyShah's dictated history and physical for further details. Full TIA work-up was performed, which was essentially negative. Neurological consultation was obtained with Dr. Cristopher PeruHemang Jamiyla Ishee who recommended changing baby aspirin to full dose aspirin, and the patient was discharged home on the night of 26th of January after all work-up being negative as he did not want to  stay in the hospital.  PERTINENT PHYSICAL EXAMINATION ON DAY OF DISCHARGE:  VITAL SIGNS: Temperature was 98.6, heart rate 86 per minute, respirations 18 per minute, blood pressure 128/70 mmHg, and he was saturating 96% on room air.  CARDIOVASCULAR: S1, S2 normal. No murmurs, rubs, or gallop.  LUNGS: Clear to auscultation bilaterally. No wheezing, rales, rhonchi, or crepitation.  ABDOMEN: Soft, benign.  NEUROLOGIC: Nonfocal examination. All other physical examination remained at baseline.   DISCHARGE MEDICATIONS: 1.  Metformin 1000 mg p.o. b.i.d.  2.  Glyburide 5 mg p.o. b.i.d.  3.  Cardizem 120 mg p.o. daily.  4.  Pravastatin 10 mg p.o. at bedtime.  5.  Finasteride 5 mg p.o. daily.  6.  Tamsulosin 0.4 mg p.o. daily.  7.  Flonase 1 spray intranasally twice a day.  8.  Spiriva once daily.  9.  Aspirin 325 mg p.o. daily.   DISCHARGE DIET: Low sodium, low fat, low cholesterol.   DISCHARGE ACTIVITY: As tolerated.   DISCHARGE INSTRUCTIONS AND FOLLOW-UP: The patient was instructed [facility] 2 to 4 weeks. He will need follow-up with Dr. Cristopher PeruHemang [facility]ille[facility] 1 to 2 weeks.   TOTAL TIME DISCHARGING THIS PATIENT: 55 minutes. ____________________________ Ellamae SiaVipul S. Sherryll BurgerShah, MD vss:sb D: 09/22/2013 13:26:00 ET T: 09/22/2013 13:38:10 ET JOB#: [id number]  cc: Mercades Bajaj S. Sherryll BurgerShah, MD, <Dictator> [facility] Hemang K. Sherryll BurgerShah, MD Ellamae SiaVIPUL S [facility] MD ELECTRONICALLY SIGNED 09/22/2013 14:03

## 2014-12-18 NOTE — Consult Note (Signed)
Referring Physician:  Remer Macho   Primary Care Physician:  Allyne Gee [facility], [address] [place], [place] [address], [phone]  Reason for Consult: Admit Date: 21-Sep-2013  Chief Complaint: AMS   History of Present Illness: History of Present Illness:   81 YO wm with acute onset of unusal speech since 4 am (asked for coffee), wife noticed at 7 am - not himself, mumbling, not making sense lasted for 2 hrs. No focal defecit noted at that time.recent head trauma, previous TIA.multiple vascular risk factors as below.Takes ASA 81 and statin, couldn't tolerate beta blockers and ACE I in past. he does have snoring, and gasping episodes but no excessive daytime sleepiness, rescitative snorting etc. has been having cough, on steroids and Abx recently PAST MEDICAL HISTORY:   Diabetes mellitus.  Coronary artery disease status post PCI and stent placement 1999.  Hypertension.  Hyperlipidemia.  Asthma.  - stent placement in coronary, blepharoplasty  FAMILY HISTORY:died of diabetes at 68. Mother died of uncertain cause at 72.  Sister has had early onset of cardiovascular disease.   SOCIAL HISTORY:denies tobacco use.  Occasionally drinks alcohol and caffeine.    ROS:  General fever   HEENT no complaints   Lungs cough   Cardiac no complaints   GI no complaints   GU no complaints   Musculoskeletal no complaints   Extremities no complaints   Skin no complaints   Neuro speech impairment   Endocrine no complaints   Psych no complaints   Past Medical/Surgical Hx:  Asthma:   Diabetes:   cardiac cath:   Stent, Cardiac:   Home Medications: Medication Instructions Last Modified Date/Time  metFORMIN 1000 mg oral tablet 1 tab(s) orally 2 times a day 26-Jan-15 11:03  glyBURIDE 5 mg oral tablet 1 tab(s) orally 2 times a day 26-Jan-15 11:03  diltiazem 120 mg oral tablet 1 tab(s) orally once a day 26-Jan-15 11:03  pravastatin 10 mg oral tablet 1 tab(s)  orally once a day (at bedtime) 26-Jan-15 11:03  finasteride 5 mg oral tablet 1 tab(s) orally once a day 26-Jan-15 11:03  tamsulosin 0.4 mg oral capsule 1 cap(s) orally once a day 26-Jan-15 11:03  Flonase 50 mcg/inh nasal spray 1 spray(s) nasal 2 times a day 26-Jan-15 11:03  aspirin 81 mg oral tablet 1 tab(s) orally once a day 26-Jan-15 11:03  Spiriva 18 mcg inhalation capsule 1 cap(s) inhaled once a day 26-Jan-15 11:03   Allergies:  No Known Allergies:   Vital Signs: **Vital Signs.:   26-Jan-15 16:43  Vital Signs Type Q 4hr  Temperature Temperature (F) 98.2  Celsius 36.7  Temperature Source oral  Pulse Pulse 72  Respirations Respirations 18  Systolic BP Systolic BP 956  Diastolic BP (mmHg) Diastolic BP (mmHg) 75  Mean BP 92  Pulse Ox % Pulse Ox % 92  Pulse Ox Activity Level  At rest  Oxygen Delivery Room Air/ 21 %   EXAM: General Exam Patient looks appropriate of age, well built, nourished and appropriately groomed.   Cardiovascular Exam: S1, S2 heart sounds present Carotid exam revealed no bruit Lung exam was ronchi, wheezing belly soft  Neurological Exam      Mental Status:      Alert,     Oriented to time, place, person and situation     Attention span and concentration seemed appropriate     Memory seemed OK.     Intact naming, repetition, comprehension.  Followed 2 step commands - no dysarthria     Fund of knowledge seemed appropriate for age and health status.       Cranial Nerves:      Olfactory and vagus nerves not are examined      Visual fields were full      Pupils were equal, round and reactive to light and accommodation      Extra-ocular movements are normal, bil ptosis Rt> left      Facial sensations are normal      Face is symmetric      Finger rub was heard symmetric in both ears      Palate and uvular movements are normal and oral sensations are OK      Neck muscle strength and shoulder shrug is normal      Tongue protrusion and uvular  elevation are normal       Motor Exam:      Tone is normal in all extremities      Muscle strength in all extremities is 5/5.      No abnormal movements, fasciculations or atrophy seen       Deep Tendon Reflexes:      symmetric 2 +      Right Toes are down going,  Left Toes are down going            Sensory Exam:      Sensations were intact to light touch in all extremities      Vibration and proprioception are also intact            Co-ordination:      Finger to nose is normal            Gait:      Gait and station seemed OK.  Lab Results: LabObservation:  26-Jan-15 14:08   OBSERVATION Reason for Test  Hepatic:  26-Jan-15 10:00   Bilirubin, Total 0.7  Alkaline Phosphatase  42 (45-117 NOTE: New Reference Range 07/17/13)  SGPT (ALT) 30  SGOT (AST)  43  Total Protein, Serum  6.2  Albumin, Serum  3.2  Routine Chem:  26-Jan-15 10:00   Glucose, Serum 83  BUN 14  Creatinine (comp) 0.78  Sodium, Serum 137  Potassium, Serum 4.6  Chloride, Serum 105  CO2, Serum 28  Calcium (Total), Serum  8.0  Osmolality (calc) 273  eGFR (African American) >60  eGFR (Non-African American) >60 (eGFR values <77mL/min/1.73 m2 may be an indication of chronic kidney disease (CKD). Calculated eGFR is useful in patients with stable renal function. The eGFR calculation will not be reliable in acutely ill patients when serum creatinine is changing rapidly. It is not useful in  patients on dialysis. The eGFR calculation may not be applicable to patients at the low and high extremes of body sizes, pregnant women, and vegetarians.)  Result Comment POTASSIUM/AST - Slight hemolysis, interpret results with  - caution.  Result(s) reported on 21 Sep 2013 at 10:38AM.  Anion Gap  4  Cardiac:  26-Jan-15 10:00   Troponin I < 0.02 (0.00-0.05 0.05 ng/mL or less: NEGATIVE  Repeat testing in 3-6 hrs  if clinically indicated. >0.05 ng/mL: POTENTIAL  MYOCARDIAL INJURY. Repeat  testing in 3-6 hrs if   clinically indicated. NOTE: An increase or decrease  of 30% or more on serial  testing suggests a  clinically important change)  CK, Total 146  CPK-MB, Serum 1.7 (Result(s) reported on 21 Sep 2013 at 12:56PM.)  Routine UA:  26-Jan-15 11:31  RBC (UA) 5 /HPF  WBC (UA) 2 /HPF  Bacteria (UA) NONE SEEN  Epithelial Cells (UA) 1 /HPF  Mucous (UA) PRESENT (Result(s) reported on 21 Sep 2013 at 01:31PM.)  Routine Coag:  26-Jan-15 10:00   Prothrombin 12.7  INR 1.0 (INR reference interval applies to patients on anticoagulant therapy. A single INR therapeutic range for coumarins is not optimal for all indications; however, the suggested range for most indications is 2.0 - 3.0. Exceptions to the INR Reference Range may include: Prosthetic heart valves, acute myocardial infarction, prevention of myocardial infarction, and combinations of aspirin and anticoagulant. The need for a higher or lower target INR must be assessed individually. Reference: The Pharmacology and Management of the Vitamin K  antagonists: the seventh ACCP Conference on Antithrombotic and Thrombolytic Therapy. ZOXWR.6045 Sept:126 (3suppl): [id number]. A HCT value >55% may artifactually increase the PT.  In one study,  the increase was an average of 25%. Reference:  "Effect on Routine and Special Coagulation Testing Values of Citrate Anticoagulant Adjustment in Patients with High HCT Values." American Journal of Clinical Pathology 2006;126:400-405.)  Activated PTT (APTT) 27.8 (A HCT value >55% may artifactually increase the APTT. In one study, the increase was an average of 19%. Reference: "Effect on Routine and Special Coagulation Testing Values of Citrate Anticoagulant Adjustment in Patients with High HCT Values." American Journal of Clinical Pathology 2006;126:400-405.)  Routine Hem:  26-Jan-15 10:00   WBC (CBC) 4.1  RBC (CBC)  4.17  Hemoglobin (CBC) 13.3  Hematocrit (CBC) 40.4  Platelet Count (CBC)  118  MCV 97   MCH 32.0  MCHC 33.0  RDW 14.2  Neutrophil % 69.6  Lymphocyte % 14.6  Monocyte % 14.8  Eosinophil % 0.6  Basophil % 0.4  Neutrophil # 2.9  Lymphocyte #  0.6  Monocyte # 0.6  Eosinophil # 0.0  Basophil # 0.0 (Result(s) reported on 21 Sep 2013 at 10:15AM.)   Radiology Results: [place]:    26-Jan-15 13:13, US Carotid Doppler Bilateral  US Carotid Doppler Bilateral   REASON FOR EXAM:    TIA  COMMENTS:       PROCEDURE: [place]  - US CAROTID DOPPLER BILATERAL  - Sep 21 2013  1:13PM     CLINICAL DATA:  TIA, diabetes, tobacco abuse.    EXAM:  BILATERAL CAROTID DUPLEX ULTRASOUND    TECHNIQUE:  Pearline Cables scale imaging, color Doppler and duplex ultrasound was  performed of bilateral carotid and vertebral arteries in the neck.    COMPARISON:  None.  REVIEW OF SYSTEMS:  Quantification of carotid stenosis is based on velocity parameters  that correlate the residual internal carotid diameter with  NASCET-based stenosis levels, using the diameter of the distal  internal carotid lumen as the denominator for stenosis measurement.    The following velocity measurements were obtained:    PEAK SYSTOLIC/END DIASTOLIC    RIGHT    ICA:                     67/15cm/sec    CCA:                     40/98JX/BJY  SYSTOLIC ICA/CCA RATIO:  0.7    DIASTOLIC ICA/CCA RATIO: 0 6    ECA:                     144cm/sec    LEFT    ICA:  66/20cm/sec    CCA:                     16/10 cm/sec    SYSTOLIC ICA/CCA RATIO:  0.8    DIASTOLIC ICA/CCA RATIO: 1.2  ECA:                     95cm/sec    FINDINGS:  RIGHT CAROTID ARTERY: There circumferential partially calcified  plaque in the distal common carotid artery, bulb, and proximal ICA  without high-grade stenosis. Normal waveforms and color Doppler  signal.    RIGHT VERTEBRAL ARTERY:  Normal flow direction and waveform.    LEFT CAROTID ARTERY: Eccentric plaque in the mid common carotid  artery without significant stenosis. Plaque  effaces the carotid bulb  and extends into the proximal ICA and to the origin of the external  carotid artery, without high-grade stenosis. Normal waveforms and  color Doppler signal.  LEFT VERTEBRAL ARTERY: Normal flow direction and waveform.     IMPRESSION:  1. Bilateral carotid bifurcation and proximal ICA plaque, resulting  in less than 50% diameter stenosis. The exam does not exclude plaque  ulceration or embolization. Continued surveillance recommended.      Electronically Signed    RU:[id number]  Hassell M.D.    On: 09/21/2013 13:31         Verified By: Kandis Cocking, M.D.,  MRI:    26-Jan-15 15:41, MRI Brain Without Contrast  MRI Brain Without Contrast   REASON FOR EXAM:    TIA  COMMENTS:       PROCEDURE: MR  - MR BRAIN WO CONTRAST  - Sep 21 2013  3:41PM     CLINICAL DATA:  Slurred speech    EXAM:  MRI HEAD WITHOUT CONTRAST    TECHNIQUE:  Multiplanar, multiecho pulse sequences of the brain and surrounding  structures were obtained without intravenous contrast.    COMPARISON:  CT 09/21/2013  FINDINGS:  Moderate atrophy. Negative for hydrocephalus. Patchy  hyperintensities throughout the cerebral white matter bilaterally  consistent with chronic microvascular ischemia. Small areas of  chronic infarct in the right cerebellum and central pons.    Image quality degraded by patient motion.    Diffusion-weighted imaging reveals areas of T2 shine through in the  right parietal white matter and right frontal white matter however  these are not felt to be due to acute infarct based on the ADC map.  No acute infarct is identified.    Negative for hemorrhage or mass lesion.  Mucosal edema in the paranasal sinuses.     IMPRESSION:  Atrophy and chronicmicrovascular ischemia.  No acute infarct.      Electronically Signed    By: Franchot Gallo M.D.    On: 09/21/2013 15:53         Verified By: Truett Perna, M.D.,  CT:    26-Jan-15 10:45, CT Head Without  Contrast  CT Head Without Contrast   REASON FOR EXAM:    s;lurred speech  COMMENTS:       PROCEDURE: CT  - CT HEAD WITHOUT CONTRAST  - Sep 21 2013 10:45AM     CLINICAL DATA:  Speech difficulty, mental status change, weakness    EXAM:  CT HEAD WITHOUT CONTRAST    TECHNIQUE:  Contiguous axial images were obtained from the base of the skull  through the vertex without intravenous contrast.    COMPARISON:  CT HEAD W/O CM dated 06/23/2008  FINDINGS:  There  is mild diffuse cerebral and cerebellar atrophy. The  ventricles are normal in size and position. There is no intracranial  hemorrhage nor intracranial mass effect. There is no evidence of an  evolving ischemic infarction. Mildly decreased density in the deep  white matter of both cerebral hemispheres is consistent with chronic  small vessel ischemic type change. The cerebellum and brainstem are  normal in density.    At bone window settings there is mucoperiosteal thickening within  the frontal and ethmoid and maxillary and sphenoid sinuses which  appears to have developed since the previous study. The patient has  undergone previous bilateral maxillary antrostomies as well as  bilateral ethmoidectomies. The mastoid air cells are well  pneumatized. There is no evidence of an acute skull fracture.   IMPRESSION:  1. There isno evidence of an acute ischemic or hemorrhagic  infarction. There is no intracranial mass effect or hydrocephalus.  2. There are findings consistent with chronic small vessel ischemic  type change of both cerebral hemispheres.  3. There is new mucoperiosteal thickening within the paranasal  sinuses which may reflect acute sinusitis. There are postsurgical  changes from previous sinus surgery.      Electronically Signed    By: David  Martinique    On: 09/21/2013 11:10       Verified By: DAVID A. [place]ique, M.D., MD   Radiology Impression: Radiology Impression: - MRI - some atrophy, WM changes - no new DWI  change (T2 shine through right parietal vs punctate DWI lesion)   Impression/Recommendations: Recommendations:   1) Acute onset of speech impairment - confusion - likely represents - TIA.vascular risk factors, age, HTN, DM, HLD, possible OSA, h/o CAD.of onset: today at 4 amantiplatelet agent - ASA 81 - will switch to 325 mgup: neg ECHO, MRI brain, Extracranial vessel ultrasound (carotid and vertebrals):monitoring OK so far - will consider out pt holter monitor.treat fever early (hyperthermia impairs stroke recovery), continue pravastatin (Lipid lowering and non lipid lowering effects of statin are useful in secondary stroke prevention.pt should be considered for evaluation of OSA which is a treatable risk factor.Post discharge long term cardiac rhythm monitoring has shown much higher incidence of paroxysmal A.fib in patient's with cryptogenic ischemic infract.Consider flu vaccine and multivitamines. OK to be discharged - f/u with me in 1-2 wks.  Electronic Signatures: Ray Church (MD)  (Signed 26-Jan-15 19:48)  Authored: REFERRING PHYSICIAN, Primary Care Physician, Consult, History of Present Illness, Review of Systems, PAST MEDICAL/SURGICAL HISTORY, HOME MEDICATIONS, ALLERGIES, NURSING VITAL SIGNS, Physical Exam-, LAB RESULTS, RADIOLOGY RESULTS, Recommendations   Last Updated: 26-Jan-15 19:48 by Ray Church (MD)

## 2014-12-18 NOTE — H&P (Signed)
PATIENT NAME:  George Hensley, George Hensley MR#:  [id number] DATE OF BIRTH:  Oct 19, 1930  DATE OF ADMISSION:  09/21/2013  DICTATING HOSPITALIST: Delfino LovettVipul Temitope Griffing, MD  PRIMARY CARE PHYSICIAN: [facility]   REQUESTING PHYSICIAN: Sharyn CreamerMark Quale, MD  CHIEF COMPLAINT: Difficulty getting words out.   HISTORY OF PRESENT ILLNESS: The patient is an 81 year old male with a known history of coronary artery disease, asthma, diabetes, who is being admitted for TIA. The patient woke up this morning around 7 when he was found to have some difficulty getting words out as per his family, the patient is also somewhat confused and did not have his normal behavior. They also noticed him being shaky and brought him to the Emergency Department.   While in the ED, he had a negative CT scan of the head and he is being admitted for further evaluation and management including TIA workup.   PAST MEDICAL HISTORY:  1.  Diabetes.  2.  Asthma. 3.  Coronary artery disease about 15 to 20 years ago and had a stent placed.   ALLERGIES: No known drug allergies.   FAMILY HISTORY: Father had diabetes. Mother had asthma and colon cancer.   SOCIAL HISTORY: He lives at home with his wife. No smoking or alcohol. 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  ALLERGIES: No known drug allergies.   CURRENT MEDICATIONS:  1.  Aspirin 81 mg p.o. daily.  2.  Cardizem 120 mg p.o. daily.  3.  Finasteride 5 mg p.o. daily.  4.  Flonase 1 spray intranasally twice a day.  5.  Glyburide 5 mg p.o. b.i.d.  6.  Metformin 1000 mg p.o. b.i.d.  7.  Pravastatin 10 mg p.o. at bedtime.  8.  Spiriva once daily. 9.  Tamsulosin 0.4 mg p.o. daily.   REVIEW OF SYSTEMS:  CONSTITUTIONAL: No fever, fatigue or weakness. EYES: No blurred or double vision.  ENT: No tinnitus or ear pain.  RESPIRATORY: No cough, wheezing, or hemoptysis.  CARDIOVASCULAR: No chest pain, orthopnea, edema.  GASTROINTESTINAL: No nausea, vomiting or diarrhea.  GENITOURINARY: No dysuria or  hematuria.  ENDOCRINE: No polyuria or nocturia.  HEMATOLOGY: No anemia or easy bruising.  SKIN: No rash or lesion.  MUSCULOSKELETAL: No arthritis or muscle cramp.  NEUROLOGICAL: Some difficulty getting words out earlier today which has been resolved. Also reports some confusion which is resolved.  PSYCHIATRIC: No history of anxiety or depression.   PHYSICAL EXAMINATION:  VITAL SIGNS: Temperature 98.1, heart rate 87 per minute, respirations 18 per minute, blood pressure 140/73 mmHg, saturating 95% on room air.  GENERAL: An 81 year old male lying in the bed comfortably without any acute distress.  EYES: Pupils equal, round, reactive to light and accommodation. No scleral icterus. Extraocular muscles are intact .  HEENT: Head atraumatic, normocephalic. Oropharynx and nasopharynx clear.  NECK: Supple. No jugular venous distention. No thyroid enlargement or tenderness.  LUNGS: Clear to auscultation bilaterally. No wheezing or rales or crepitation.  CARDIOVASCULAR: S1, S2 normal. No murmurs, rubs or gallop.  ABDOMEN: Soft, nontender, nondistended. Bowel sounds present. No organomegaly or masses.  EXTREMITIES: No pedal edema, cyanosis or clubbing.  NEUROLOGIC: Nonfocal examination. Cranial nerves II through XII are intact. Muscle strength 5/5 in all extremities. Sensation intact.  PSYCHIATRIC: Alert and oriented x3.  SKIN: No obvious rash, lesion, or ulcer.   LABORATORY PANEL: Normal BMP. Normal liver function tests except AST of 43. Normal first set of troponins. Normal CBC. Normal coagulation panel. Urinalysis is negative.   CT scan of the  head in the ED showed no acute intracranial abnormalities. Chronic small vessel ischemic disease present in both cerebral hemispheres.   EKG showed no major ST-T changes.   IMPRESSION AND PLAN:  1.  Suspected transient ischemic attack. Will start him on full-dose aspirin, statin. Will consult neurology. Get carotid Dopplers, MRI of the brain, and 2-D echo.  His symptoms are already resolved. If there is no further intervention and if neurology feels appropriate after 1-night monitoring on telemetry, the patient can be discharged tomorrow morning depending on the clinical condition and progress.  2.  Diabetes mellitus. We will continue home regimen.  3.  Coronary artery disease status post stenting. Will continue aspirin and statin.  4.  Asthma, stable on home medication. No exacerbation.   CODE STATUS: FULL CODE.   TOTAL TIME TAKING CARE OF THIS PATIENT: Was 45 minutes.   ____________________________ Ellamae Sia. Sherryll Burger, MD vss:np D: 09/21/2013 18:52:22 ET T: 09/21/2013 20:02:21 ET JOB#: [id number]  cc: Isadore Bokhari S. Sherryll Burger, MD, <Dictator> [facility] MD Ellamae Sia [facility] MD ELECTRONICALLY SIGNED 09/22/2013 13:36

## 2014-12-29 ENCOUNTER — Other Ambulatory Visit: Payer: Self-pay | Admitting: Internal Medicine

## 2014-12-29 DIAGNOSIS — M48061 Spinal stenosis, lumbar region without neurogenic claudication: Secondary | ICD-10-CM

## 2014-12-30 ENCOUNTER — Ambulatory Visit
Admission: RE | Admit: 2014-12-30 | Discharge: 2014-12-30 | Disposition: A | Discharge status: Home / Self Care | Payer: Medicare Other | Source: Ambulatory Visit | Attending: Internal Medicine | Admitting: Internal Medicine

## 2014-12-30 DIAGNOSIS — M5136 Other intervertebral disc degeneration, lumbar region: Secondary | ICD-10-CM | POA: Diagnosis not present

## 2014-12-30 DIAGNOSIS — M4806 Spinal stenosis, lumbar region: Secondary | ICD-10-CM | POA: Diagnosis not present

## 2014-12-30 DIAGNOSIS — M48061 Spinal stenosis, lumbar region without neurogenic claudication: Secondary | ICD-10-CM

## 2015-01-06 ENCOUNTER — Ambulatory Visit: Payer: Medicare Other

## 2015-08-02 ENCOUNTER — Emergency Department: Payer: Medicare Other

## 2015-08-02 ENCOUNTER — Inpatient Hospital Stay
Admission: EM | Admit: 2015-08-02 | Discharge: 2015-08-03 | DRG: 189 | Disposition: A | Discharge status: Home / Self Care | Payer: Medicare Other | Attending: Internal Medicine | Admitting: Internal Medicine

## 2015-08-02 DIAGNOSIS — J441 Chronic obstructive pulmonary disease with (acute) exacerbation: Secondary | ICD-10-CM | POA: Diagnosis present

## 2015-08-02 DIAGNOSIS — R0902 Hypoxemia: Secondary | ICD-10-CM

## 2015-08-02 DIAGNOSIS — E119 Type 2 diabetes mellitus without complications: Secondary | ICD-10-CM | POA: Diagnosis present

## 2015-08-02 DIAGNOSIS — I4891 Unspecified atrial fibrillation: Secondary | ICD-10-CM | POA: Diagnosis present

## 2015-08-02 DIAGNOSIS — J9621 Acute and chronic respiratory failure with hypoxia: Secondary | ICD-10-CM | POA: Diagnosis not present

## 2015-08-02 DIAGNOSIS — J45909 Unspecified asthma, uncomplicated: Secondary | ICD-10-CM | POA: Diagnosis present

## 2015-08-02 DIAGNOSIS — Z955 Presence of coronary angioplasty implant and graft: Secondary | ICD-10-CM

## 2015-08-02 DIAGNOSIS — I2510 Atherosclerotic heart disease of native coronary artery without angina pectoris: Secondary | ICD-10-CM | POA: Diagnosis present

## 2015-08-02 DIAGNOSIS — J40 Bronchitis, not specified as acute or chronic: Secondary | ICD-10-CM

## 2015-08-02 DIAGNOSIS — J9601 Acute respiratory failure with hypoxia: Secondary | ICD-10-CM | POA: Diagnosis present

## 2015-08-02 DIAGNOSIS — Z9049 Acquired absence of other specified parts of digestive tract: Secondary | ICD-10-CM | POA: Diagnosis not present

## 2015-08-02 DIAGNOSIS — T380X5A Adverse effect of glucocorticoids and synthetic analogues, initial encounter: Secondary | ICD-10-CM | POA: Diagnosis present

## 2015-08-02 LAB — CBC
HCT: 47.5 % (ref 40.0–52.0)
HEMOGLOBIN: 15.7 g/dL (ref 13.0–18.0)
MCH: 31.6 pg (ref 26.0–34.0)
MCHC: 33 g/dL (ref 32.0–36.0)
MCV: 95.9 fL (ref 80.0–100.0)
PLATELETS: 219 10*3/uL (ref 150–440)
RBC: 4.95 MIL/uL (ref 4.40–5.90)
RDW: 14.1 % (ref 11.5–14.5)
WBC: 7.7 10*3/uL (ref 3.8–10.6)

## 2015-08-02 LAB — GLUCOSE, CAPILLARY
Glucose-Capillary: 295 mg/dL — ABNORMAL HIGH (ref 65–99)
Glucose-Capillary: 390 mg/dL — ABNORMAL HIGH (ref 65–99)
Glucose-Capillary: 364 mg/dL — ABNORMAL HIGH (ref 65–99)
Glucose-Capillary: 388 mg/dL — ABNORMAL HIGH (ref 65–99)
Glucose-Capillary: 404 mg/dL — ABNORMAL HIGH (ref 65–99)

## 2015-08-02 LAB — COMPREHENSIVE METABOLIC PANEL
ALT: 17 U/L (ref 17–63)
ANION GAP: 6 (ref 5–15)
AST: 17 U/L (ref 15–41)
Albumin: 4 g/dL (ref 3.5–5.0)
Alkaline Phosphatase: 59 U/L (ref 38–126)
BUN: 13 mg/dL (ref 6–20)
CHLORIDE: 101 mmol/L (ref 101–111)
CO2: 31 mmol/L (ref 22–32)
CREATININE: 1.1 mg/dL (ref 0.61–1.24)
Calcium: 8.9 mg/dL (ref 8.9–10.3)
Glucose, Bld: 256 mg/dL — ABNORMAL HIGH (ref 65–99)
POTASSIUM: 4 mmol/L (ref 3.5–5.1)
SODIUM: 138 mmol/L (ref 135–145)
Total Bilirubin: 1.1 mg/dL (ref 0.3–1.2)
Total Protein: 7.2 g/dL (ref 6.5–8.1)

## 2015-08-02 LAB — TROPONIN I
TROPONIN I: 0.04 ng/mL — AB (ref <0.031)
Troponin I: 0.03 ng/mL (ref <0.031)
Troponin I: <0.03 ng/mL (ref <0.031)
Troponin I: <0.03 ng/mL (ref <0.031)

## 2015-08-02 LAB — BLOOD GAS, VENOUS
ACID-BASE EXCESS: 1.8 mmol/L (ref 0.0–3.0)
BICARBONATE: 28.6 meq/L — AB (ref 21.0–28.0)
PCO2 VEN: 53 mmHg (ref 44.0–60.0)
PH VEN: 7.34 (ref 7.320–7.430)
Patient temperature: 37

## 2015-08-02 LAB — TSH: TSH: 4.247 u[IU]/mL (ref 0.350–4.500)

## 2015-08-02 LAB — HEMOGLOBIN A1C: Hgb A1c MFr Bld: 9 % — ABNORMAL HIGH (ref 4.0–6.0)

## 2015-08-02 MED ORDER — IPRATROPIUM-ALBUTEROL 0.5-2.5 (3) MG/3ML IN SOLN
3.0000 mL | Freq: Once | RESPIRATORY_TRACT | Status: AC
  Administered 2015-08-02: 3 mL via RESPIRATORY_TRACT
  Filled 2015-08-02: qty 3

## 2015-08-02 MED ORDER — MORPHINE SULFATE (PF) 2 MG/ML IV SOLN
2.0000 mg | INTRAVENOUS | Status: DC | PRN
Start: 2015-08-02 — End: 2015-08-03

## 2015-08-02 MED ORDER — ALBUTEROL SULFATE (2.5 MG/3ML) 0.083% IN NEBU: 2.5000 mg | INHALATION_SOLUTION | RESPIRATORY_TRACT | Status: DC | PRN

## 2015-08-02 MED ORDER — ONDANSETRON HCL 4 MG/2ML IJ SOLN: 4.0000 mg | Freq: Four times a day (QID) | INTRAMUSCULAR | Status: DC | PRN

## 2015-08-02 MED ORDER — HEPARIN SODIUM (PORCINE) 5000 UNIT/ML IJ SOLN
5000.0000 [IU] | Freq: Three times a day (TID) | INTRAMUSCULAR | Status: DC
  Administered 2015-08-02 – 2015-08-03 (×2): 5000 [IU] via SUBCUTANEOUS
  Filled 2015-08-02 (×3): qty 1

## 2015-08-02 MED ORDER — ACETAMINOPHEN 325 MG PO TABS: 650.0000 mg | ORAL_TABLET | Freq: Four times a day (QID) | ORAL | Status: DC | PRN

## 2015-08-02 MED ORDER — PREDNISONE 5 MG PO TABS: 10.0000 mg | ORAL_TABLET | Freq: Once | ORAL | Status: DC

## 2015-08-02 MED ORDER — INSULIN DETEMIR 100 UNIT/ML ~~LOC~~ SOLN
14.0000 [IU] | Freq: Every day | SUBCUTANEOUS | Status: DC
  Administered 2015-08-02: 14 [IU] via SUBCUTANEOUS
  Filled 2015-08-02 (×2): qty 0.14

## 2015-08-02 MED ORDER — ONDANSETRON HCL 4 MG PO TABS: 4.0000 mg | ORAL_TABLET | Freq: Four times a day (QID) | ORAL | Status: DC | PRN

## 2015-08-02 MED ORDER — INSULIN ASPART 100 UNIT/ML ~~LOC~~ SOLN
4.0000 [IU] | Freq: Three times a day (TID) | SUBCUTANEOUS | Status: DC
  Administered 2015-08-02 – 2015-08-03 (×2): 4 [IU] via SUBCUTANEOUS
  Filled 2015-08-02: qty 4

## 2015-08-02 MED ORDER — INSULIN ASPART 100 UNIT/ML ~~LOC~~ SOLN
0.0000 [IU] | Freq: Every day | SUBCUTANEOUS | Status: DC
  Administered 2015-08-02: 5 [IU] via SUBCUTANEOUS
  Filled 2015-08-02: qty 13
  Filled 2015-08-02: qty 5

## 2015-08-02 MED ORDER — DILTIAZEM HCL ER COATED BEADS 120 MG PO CP24
120.0000 mg | ORAL_CAPSULE | Freq: Every day | ORAL | Status: DC
  Administered 2015-08-02 – 2015-08-03 (×2): 120 mg via ORAL
  Filled 2015-08-02 (×3): qty 1

## 2015-08-02 MED ORDER — INSULIN ASPART 100 UNIT/ML ~~LOC~~ SOLN: 0.0000 [IU] | Freq: Three times a day (TID) | SUBCUTANEOUS | Status: DC

## 2015-08-02 MED ORDER — IPRATROPIUM-ALBUTEROL 20-100 MCG/ACT IN AERS: 1.0000 | INHALATION_SPRAY | Freq: Four times a day (QID) | RESPIRATORY_TRACT | Status: DC

## 2015-08-02 MED ORDER — INSULIN ASPART 100 UNIT/ML ~~LOC~~ SOLN
0.0000 [IU] | Freq: Three times a day (TID) | SUBCUTANEOUS | Status: DC
  Administered 2015-08-02 (×2): 9 [IU] via SUBCUTANEOUS
  Administered 2015-08-03: 5 [IU] via SUBCUTANEOUS
  Filled 2015-08-02: qty 5

## 2015-08-02 MED ORDER — MAGNESIUM SULFATE 2 GM/50ML IV SOLN
2.0000 g | Freq: Once | INTRAVENOUS | Status: AC
  Administered 2015-08-02: 2 g via INTRAVENOUS
  Filled 2015-08-02: qty 50

## 2015-08-02 MED ORDER — METHYLPREDNISOLONE SODIUM SUCC 125 MG IJ SOLR
125.0000 mg | Freq: Once | INTRAMUSCULAR | Status: AC
Start: 2015-08-02 — End: 2015-08-02
  Administered 2015-08-02: 125 mg via INTRAVENOUS
  Filled 2015-08-02: qty 2

## 2015-08-02 MED ORDER — IPRATROPIUM-ALBUTEROL 0.5-2.5 (3) MG/3ML IN SOLN: 3.0000 mL | Freq: Once | RESPIRATORY_TRACT | Status: AC

## 2015-08-02 MED ORDER — IPRATROPIUM-ALBUTEROL 0.5-2.5 (3) MG/3ML IN SOLN
3.0000 mL | Freq: Four times a day (QID) | RESPIRATORY_TRACT | Status: DC
  Administered 2015-08-02 – 2015-08-03 (×5): 3 mL via RESPIRATORY_TRACT
  Filled 2015-08-02 (×5): qty 3

## 2015-08-02 MED ORDER — SODIUM CHLORIDE 0.9 % IJ SOLN
3.0000 mL | Freq: Two times a day (BID) | INTRAMUSCULAR | Status: DC
  Administered 2015-08-02 – 2015-08-03 (×2): 3 mL via INTRAVENOUS

## 2015-08-02 MED ORDER — PREDNISONE 20 MG PO TABS: 20.0000 mg | ORAL_TABLET | Freq: Once | ORAL | Status: DC

## 2015-08-02 MED ORDER — PREDNISONE 50 MG PO TABS
50.0000 mg | ORAL_TABLET | Freq: Once | ORAL | Status: AC
  Administered 2015-08-02: 50 mg via ORAL
  Filled 2015-08-02: qty 1

## 2015-08-02 MED ORDER — MOMETASONE FURO-FORMOTEROL FUM 200-5 MCG/ACT IN AERO
2.0000 | INHALATION_SPRAY | Freq: Two times a day (BID) | RESPIRATORY_TRACT | Status: DC
  Administered 2015-08-02 – 2015-08-03 (×3): 2 via RESPIRATORY_TRACT
  Filled 2015-08-02: qty 8.8

## 2015-08-02 MED ORDER — LEVOFLOXACIN 500 MG PO TABS
500.0000 mg | ORAL_TABLET | Freq: Every day | ORAL | Status: DC
  Administered 2015-08-02 – 2015-08-03 (×2): 500 mg via ORAL
  Filled 2015-08-02 (×2): qty 1

## 2015-08-02 MED ORDER — PREDNISONE 5 MG PO TABS: 30.0000 mg | ORAL_TABLET | Freq: Once | ORAL | Status: DC

## 2015-08-02 MED ORDER — PRAVASTATIN SODIUM 20 MG PO TABS
10.0000 mg | ORAL_TABLET | Freq: Every day | ORAL | Status: DC
  Administered 2015-08-02 – 2015-08-03 (×2): 10 mg via ORAL
  Filled 2015-08-02 (×2): qty 2

## 2015-08-02 MED ORDER — PREDNISONE 20 MG PO TABS
40.0000 mg | ORAL_TABLET | Freq: Once | ORAL | Status: AC
  Administered 2015-08-03: 40 mg via ORAL
  Filled 2015-08-02: qty 2

## 2015-08-02 MED ORDER — ACETAMINOPHEN 650 MG RE SUPP: 650.0000 mg | Freq: Four times a day (QID) | RECTAL | Status: DC | PRN

## 2015-08-02 MED ORDER — DOCUSATE SODIUM 100 MG PO CAPS
100.0000 mg | ORAL_CAPSULE | Freq: Two times a day (BID) | ORAL | Status: DC
  Administered 2015-08-02 – 2015-08-03 (×3): 100 mg via ORAL
  Filled 2015-08-02 (×3): qty 1

## 2015-08-02 MED ORDER — INSULIN ASPART 100 UNIT/ML ~~LOC~~ SOLN
3.0000 [IU] | Freq: Once | SUBCUTANEOUS | Status: AC
  Administered 2015-08-02: 3 [IU] via SUBCUTANEOUS
  Filled 2015-08-02: qty 3

## 2015-08-02 NOTE — ED Notes (Signed)
Assisted pt to the BR  ambulated with a steady gait   SOB and desat noted with exertion

## 2015-08-02 NOTE — ED Notes (Signed)
Patient with desaturation to 85% without oxygen. EDP notified of same. Oxygen placed back on the patient.

## 2015-08-02 NOTE — ED Provider Notes (Signed)
[facility] Emergency Department Provider Note  ____________________________________________  Time seen: Approximately 245 AM  I have reviewed the triage vital signs and the nursing notes.   HISTORY  Chief Complaint No chief complaint on file.    HPI George Hensley is a 81 y.o. male who comes into the hospital with difficulty breathing. The patient reports that he started having difficulty breathing only yesterday. The patient has had a cough for years but has gotten bad in the last couple of days. The patient denies any chest pain, fevers abdominal pain, nausea or vomiting. He does not wear O2 at home. The patient reports that he does have inhalers at home and uses [facility] typically. He reports that even with his inhalers his reading did not improve. The patient has a pulmonologist which she sees in [place] he reports for his asthma. According to the patient's family he has been having difficulty with his breathing for years but because of how bad it was they decided to come in for evaluation. The patient reports that he does have some mild worsening of his symptoms when he exerts himself but it is not severe.   Past Medical History  Diagnosis Date  . Asthma   . Diabetes mellitus     Patient Active Problem List   Diagnosis Date Noted  . Acute on chronic respiratory failure with hypoxia (HCC) 08/02/2015  . Asthma 04/04/2011  . Pulmonary nodule 03/06/2011    Past Surgical History  Procedure Laterality Date  . Cholecystectomy  2000  . Coronary stent placement  1999    Current Outpatient Rx  Name  Route  Sig  Dispense  Refill  . azithromycin (ZITHROMAX) 250 MG tablet   Oral   Take 1 tablet by mouth 3 (three) times a week. M-W-F         . diltiazem (CARDIZEM LA) 120 MG 24 hr tablet   Oral   Take 120 mg by mouth daily.         Marland Kitchen diltiazem (DILACOR XR) 120 MG 24 hr capsule   Oral   Take 120 mg by mouth daily.           Marland Kitchen glimepiride  (AMARYL) 4 MG tablet   Oral   Take 4 mg by mouth 2 (two) times daily.         . metFORMIN (GLUCOPHAGE) 1000 MG tablet   Oral   Take 1,000 mg by mouth 2 (two) times daily.         . Mometasone Furo-Formoterol Fum (DULERA) 200-5 MCG/ACT AERO   Inhalation   Inhale 2 puffs into the lungs 2 (two) times daily.   1 Inhaler   3   . pravastatin (PRAVACHOL) 10 MG tablet   Oral   Take 10 mg by mouth daily.         . predniSONE (DELTASONE) 5 MG tablet   Oral   Take 5 mg by mouth daily with breakfast.           Allergies Review of patient's allergies indicates no known allergies.  Family History  Problem Relation Age of Onset  . Asthma Brother     Social History Social History  Substance Use Topics  . Smoking status: Never Smoker   . Smokeless tobacco: Current User    Types: Chew  . Alcohol Use: Yes     Comment: socially    Review of Systems Constitutional: No fever/chills Eyes: No visual changes. ENT: No sore throat. Cardiovascular:  Denies chest pain. Respiratory:  shortness of breath. Gastrointestinal: No abdominal pain.  No nausea, no vomiting.  No diarrhea.  No constipation. Genitourinary: Negative for dysuria. Musculoskeletal: Negative for back pain. Skin: Negative for rash. Neurological: Negative for headaches, focal weakness or numbness.  10-point ROS otherwise negative.  ____________________________________________   PHYSICAL EXAM:  VITAL SIGNS: ED Triage Vitals  Enc Vitals Group     BP 08/02/15 0237 133/83 mmHg     Pulse Rate 08/02/15 0237 110     Resp 08/02/15 0237 24     Temp 08/02/15 0237 98 F (36.7 C)     Temp src --      SpO2 08/02/15 0237 86 %     Weight 08/02/15 0237 175 lb (79.379 kg)     Height 08/02/15 0237  (1.803 m)     Head Cir --      Peak Flow --      Pain Score --      Pain Loc --      Pain Edu? --      Excl. in GC? --     Constitutional: Alert and oriented. Well appearing and in moderate distress. Eyes:  Conjunctivae are normal. PERRL. EOMI. Head: Atraumatic. Nose: No congestion/rhinnorhea. Mouth/Throat: Mucous membranes are moist.  Oropharynx non-erythematous. Cardiovascular: Normal rate, regular rhythm. Grossly normal heart sounds.  Good peripheral circulation. Respiratory: Increased respiratory effort.  With retractions. Expiratory wheezes throughout all lung fields. Gastrointestinal: Soft and nontender. No distention. Positive bowel sounds Musculoskeletal: No lower extremity tenderness nor edema. . Neurologic:  Normal speech and language. No gross focal neurologic deficits are appreciated. Skin:  Skin is warm, dry and intact.  Psychiatric: Mood and affect are normal.   ____________________________________________   LABS (all labs ordered are listed, but only abnormal results are displayed)  Labs Reviewed  COMPREHENSIVE METABOLIC PANEL - Abnormal; Notable for the following:    Glucose, Bld 256 (*)    All other components within normal limits  TROPONIN I - Abnormal; Notable for the following:    Troponin I 0.04 (*)    All other components within normal limits  BLOOD GAS, VENOUS - Abnormal; Notable for the following:    Bicarbonate 28.6 (*)    All other components within normal limits  CBC   ____________________________________________  EKG  ED ECG REPORT I, Rebecka Apley, the attending physician, personally viewed and interpreted this ECG.   Date: 08/02/2015  EKG Time: 249  Rate: 100  Rhythm: normal sinus rhythm  Axis: left axis deviation  Intervals:none  ST&T Change: flipped t waves avl  ____________________________________________  RADIOLOGY  CXR: No acute cardiopulmonary process seen ____________________________________________   PROCEDURES  Procedure(s) performed: None  Critical Care performed: No  ____________________________________________   INITIAL IMPRESSION / ASSESSMENT AND PLAN / ED COURSE  Pertinent labs & imaging results that were  available during my care of the patient were reviewed by me and considered in my medical decision making (see chart for details).  This is an 81 year old male who comes in today with shortness of breath. The patient did have some significant wheezing when he arrived as well as hypoxia. The patient was placed on O2 and brought back. I gave the patient to duo nebs as well as some Solu-Medrol for his symptoms. The patient did have improvement in his breathing and was sleeping comfortably. I did turn off the O2 to determine if the patient would remain hypoxic. After turning off the nasal cannula the patient did  desaturate, requiring their admission. I ordered some magnesium sulfate and admitted the patient to the hospitalist service. The patient's lung sounds were improved on repeat evaluation. ____________________________________________   FINAL CLINICAL IMPRESSION(S) / ED DIAGNOSES  Final diagnoses:  Bronchitis  Hypoxia      Rebecka ApleyAllison P Sharlene Mccluskey, MD 08/02/15 [phone]

## 2015-08-02 NOTE — Progress Notes (Signed)
[facility] -  at [facility]   PATIENT NAME: George BlightJames Hensley    MR#:  [id number]  DATE OF BIRTH:  1930-10-18  SUBJECTIVE:  CHIEF COMPLAINT:  No chief complaint on file.  feels much better, on oxygen 2 L by nasal cannula  REVIEW OF SYSTEMS:  CONSTITUTIONAL: No fever, fatigue or weakness.  EYES: No blurred or double vision.  EARS, NOSE, AND THROAT: No tinnitus or ear pain.  RESPIRATORY: has cough, shortness of breath, wheezing but no hemoptysis.  CARDIOVASCULAR: No chest pain, orthopnea, edema.  GASTROINTESTINAL: No nausea, vomiting, diarrhea or abdominal pain.  GENITOURINARY: No dysuria, hematuria.  ENDOCRINE: No polyuria, nocturia,  HEMATOLOGY: No anemia, easy bruising or bleeding SKIN: No rash or lesion. MUSCULOSKELETAL: No joint pain or arthritis.   NEUROLOGIC: No tingling, numbness, weakness.  PSYCHIATRY: No anxiety or depression.   DRUG ALLERGIES:  No Known Allergies  VITALS:  Blood pressure 126/66, pulse 89, temperature 98.3 F (36.8 C), temperature source Oral, resp. rate 18, height 5\' 11"  (1.803 m), weight 68.856 kg (151 lb 12.8 oz), SpO2 96 %.  PHYSICAL EXAMINATION:  GENERAL:  81 y.o.-year-old patient lying in the bed with no acute distress.  EYES: Pupils equal, round, reactive to light and accommodation. No scleral icterus. Extraocular muscles intact.  HEENT: Head atraumatic, normocephalic. Oropharynx and nasopharynx clear.  NECK:  Supple, no jugular venous distention. No thyroid enlargement, no tenderness.  LUNGS: Normal breath sounds bilaterally, mild wheezing, no rales,rhonchi or crepitation. No use of accessory muscles of respiration.  CARDIOVASCULAR: S1, S2 normal. No murmurs, rubs, or gallops.  ABDOMEN: Soft, nontender, nondistended. Bowel sounds present. No organomegaly or mass.  EXTREMITIES: No pedal edema, cyanosis, or clubbing.  NEUROLOGIC: Cranial nerves II through XII are intact. Muscle strength 5/5 in all extremities.  Sensation intact. Gait not checked.  PSYCHIATRIC: The patient is alert and oriented x 3.  SKIN: No obvious rash, lesion, or ulcer.    LABORATORY PANEL:   CBC  Recent Labs Lab 08/02/15 0250  WBC 7.7  HGB 15.7  HCT 47.5  PLT 219   ------------------------------------------------------------------------------------------------------------------  Chemistries   Recent Labs Lab 08/02/15 0250  NA 138  K 4.0  CL 101  CO2 31  GLUCOSE 256*  BUN 13  CREATININE 1.10  CALCIUM 8.9  AST 17  ALT 17  ALKPHOS 59  BILITOT 1.1   ------------------------------------------------------------------------------------------------------------------  Cardiac Enzymes  Recent Labs Lab 08/02/15 1445  TROPONINI <0.03   ------------------------------------------------------------------------------------------------------------------  RADIOLOGY:  Dg Chest Portable 1 View  08/02/2015  CLINICAL DATA:  Acute onset of shortness of breath and congested cough. Initial encounter. EXAM: PORTABLE CHEST 1 VIEW COMPARISON:  Chest radiograph performed 09/21/2013, and CT of the chest performed 04/20/2014 FINDINGS: The lungs are well-aerated and clear. There is no evidence of focal opacification, pleural effusion or pneumothorax. The cardiomediastinal silhouette is within normal limits. No acute osseous abnormalities are seen. IMPRESSION: No acute cardiopulmonary process seen. Electronically Signed   By: Roanna RaiderJeffery  Chang M.D.   On: 08/02/2015 03:09    EKG:   Orders placed or performed during the hospital encounter of 08/02/15  . EKG 12-Lead  . EKG 12-Lead  . ED EKG  . ED EKG    ASSESSMENT AND PLAN:   1. Acute on chronic respiratory failure with COPD exacerbation: was hypoxic to 86% on room air.  on 2 L of oxygen via nasal cannula today. Continue Levaquin, nebulizer and prednisone taper  2. Coronary artery disease: Stable. Continue aspirin   3.  Atrial fibrillation: Continue diltiazem 4. Diabetes  mellitus type 2: Not controlled.  Add Levemir 14 units subcutaneous at bedtime and NovoLog 4 units subcutaneous 3 times a day. Hold oral hypoglycemics. Continue sliding scale insulin.   All the records are reviewed and case discussed with Care Management/Social Workerr. Management plans discussed with the patient, his wife and they are in agreement.  CODE STATUS: Full code  TOTAL TIME TAKING CARE OF THIS PATIENT: 46 minutes.  Greater than 50% time was spent on coordination of care and face-to-face counseling.  POSSIBLE D/C IN 2 DAYS, DEPENDING ON CLINICAL CONDITION.   Shaune Pollack M.D on 08/02/2015 at 4:59 PM  Between 7am to 6pm - Pager - [phone]  After 6pm go to [email or web address] - password [email or web address] [facility]  [facility]  Office  [phone]  CC: Primary care physician; George Hensley., MD

## 2015-08-02 NOTE — ED Notes (Signed)
Pt to triage via w/c with no distress noted; pt reports SOB and cough since last night; pt with congested cough noted

## 2015-08-02 NOTE — ED Notes (Signed)
Patient given sip of water. Wife remains at bedside.

## 2015-08-02 NOTE — Progress Notes (Signed)
Inpatient Diabetes Program Recommendations  AACE/ADA: New Consensus Statement on Inpatient Glycemic Control (2015)  Target Ranges:  Prepandial:   less than 140 mg/dL      Peak postprandial:   less than 180 mg/dL (1-2 hours)      Critically ill patients:  140 - 180 mg/dL   Review of Glycemic Control:  Results for George Hensley, George Hensley (MRN [id number]) as of 08/02/2015 11:51  Ref. Range 08/02/2015 08:24 08/02/2015 11:33  Glucose-Capillary Latest Ref Range: 65-99 mg/dL 409295 (H) 811390 (H)    Diabetes history: Diabetes Mellitus-A1C pending Outpatient Diabetes medications: Metformin 1000 mg bid, Amaryl 4 mg bid Current orders for Inpatient glycemic control:  Novolog sensitive tid with meals and HS  Inpatient Diabetes Program Recommendations:   Note that CBG's likely increased due to steroids.  May consider adding Levemir 14 units daily and Novolog 4 units tid with meals while patient is in the hospital.  Thanks, Beryl MeagerJenny Daphene Chisholm, RN, BC-ADM Inpatient Diabetes Coordinator Pager [phone] (8a-5p)

## 2015-08-02 NOTE — Care Management Note (Signed)
Case Management Note  Patient Details  Name: NEPHTALI DOCKEN MRN: [id number] Date of Birth: 09-Sep-1930  Subjective/Objective:                  Met with patient who was just admitted to [facility] for COPD. His PCP is Dr. Emily Filbert; he agreed to follow up appointment. He is independent with daily activities and works part-time at [employer] He is new to O2. He uses [facility] for Rx but states his [employer] notified him that he needed to changed pharmacies. He did not indicate that he had any problems obtaining Rx. Patient does not think he needs home health.  Action/Plan:  Appointment made with Dr. Sabra Heck for Dec.16 at [place].     Expected Discharge Date:                  Expected Discharge Plan:     In-House Referral:     Discharge planning Services  CM Consult  Post Acute Care Choice:  Durable Medical Equipment Choice offered to:  Patient  DME Arranged:    DME Agency:     HH Arranged:    [place] Agency:     Status of Service:  In process, will continue to follow  Medicare Important Message Given:    Date Medicare IM Given:    Medicare IM give by:    Date Additional Medicare IM Given:    Additional Medicare Important Message give by:     If discussed at [place] of Stay Meetings, dates discussed:    Additional Comments:  Ashby Moskal, RN 08/02/2015, 10:03 AM

## 2015-08-02 NOTE — H&P (Signed)
George Hensley is an 81 y.o. male.   Chief Complaint: Shortness of breath HPI: The patient presents emergency department complaining of cough and shortness of breath. The patient states the symptoms worsened over the last 12 hours of that he has been coughing for 2-3 days. Rest pain, nausea or vomiting. In the emergency department the patient was found to have significant elevated respiratory rate which prompted the emergency department physician to give Solu-Medrol as well as IV magnesium. The patient does have multiple breathing treatments and is feeling remarkably better. However, he was unable to be weaned from supplemental oxygen. He does not use oxygen at home. Thus emergency department staff called for admission.  Past Medical History  Diagnosis Date  . Asthma   . Diabetes mellitus     Past Surgical History  Procedure Laterality Date  . Cholecystectomy  2000  . Coronary stent placement  1999    Family History  Problem Relation Age of Onset  . Asthma Brother    Social History:  reports that he has never smoked. His smokeless tobacco use includes Chew. He reports that he drinks alcohol. He reports that he does not use illicit drugs.  Allergies: No Known Allergies  Prior to Admission medications   Medication Sig Start Date End Date Taking? Authorizing Provider  azithromycin (ZITHROMAX) 250 MG tablet Take 1 tablet by mouth 3 (three) times a week. M-W-F   Yes Historical Provider, MD  diltiazem (CARDIZEM LA) 120 MG 24 hr tablet Take 120 mg by mouth daily.   Yes Historical Provider, MD  diltiazem (DILACOR XR) 120 MG 24 hr capsule Take 120 mg by mouth daily.     Yes Historical Provider, MD  glimepiride (AMARYL) 4 MG tablet Take 4 mg by mouth 2 (two) times daily.   Yes Historical Provider, MD  metFORMIN (GLUCOPHAGE) 1000 MG tablet Take 1,000 mg by mouth 2 (two) times daily.   Yes Historical Provider, MD  Mometasone Furo-Formoterol Fum (DULERA) 200-5 MCG/ACT AERO Inhale 2 puffs into the  lungs 2 (two) times daily. 07/02/11  Yes Tanda Rockers, MD  pravastatin (PRAVACHOL) 10 MG tablet Take 10 mg by mouth daily.   Yes Historical Provider, MD  predniSONE (DELTASONE) 5 MG tablet Take 5 mg by mouth daily with breakfast.   Yes Historical Provider, MD     Results for orders placed or performed during the hospital encounter of 08/02/15 (from the past 48 hour(s))  CBC     Status: None   Collection Time: 08/02/15  2:50 AM  Result Value Ref Range   WBC 7.7 3.8 - 10.6 K/uL   RBC 4.95 4.40 - 5.90 MIL/uL   Hemoglobin 15.7 13.0 - 18.0 g/dL   HCT 47.5 40.0 - 52.0 %   MCV 95.9 80.0 - 100.0 fL   MCH 31.6 26.0 - 34.0 pg   MCHC 33.0 32.0 - 36.0 g/dL   RDW 14.1 11.5 - 14.5 %   Platelets 219 150 - 440 K/uL  Comprehensive metabolic panel     Status: Abnormal   Collection Time: 08/02/15  2:50 AM  Result Value Ref Range   Sodium 138 135 - 145 mmol/L   Potassium 4.0 3.5 - 5.1 mmol/L    Comment: HEMOLYSIS AT THIS LEVEL MAY AFFECT RESULT   Chloride 101 101 - 111 mmol/L   CO2 31 22 - 32 mmol/L   Glucose, Bld 256 (H) 65 - 99 mg/dL   BUN 13 6 - 20 mg/dL   Creatinine, Ser 1.10  0.61 - 1.24 mg/dL   Calcium 8.9 8.9 - 10.3 mg/dL   Total Protein 7.2 6.5 - 8.1 g/dL   Albumin 4.0 3.5 - 5.0 g/dL   AST 17 15 - 41 U/L   ALT 17 17 - 63 U/L   Alkaline Phosphatase 59 38 - 126 U/L   Total Bilirubin 1.1 0.3 - 1.2 mg/dL   GFR calc non Af Amer >60 >60 mL/min   GFR calc Af Amer >60 >60 mL/min    Comment: (NOTE) The eGFR has been calculated using the CKD EPI equation. This calculation has not been validated in all clinical situations. eGFR's persistently <60 mL/min signify possible Chronic Kidney Disease.    Anion gap 6 5 - 15  Troponin I     Status: Abnormal   Collection Time: 08/02/15  2:50 AM  Result Value Ref Range   Troponin I 0.04 (H) <0.031 ng/mL    Comment: READ BACK AND VERIFIED LEE FERGUSON AT 7681 08/02/15 WDM        PERSISTENTLY INCREASED TROPONIN VALUES IN THE RANGE OF 0.04-0.49 ng/mL  CAN BE SEEN IN:       -UNSTABLE ANGINA       -CONGESTIVE HEART FAILURE       -MYOCARDITIS       -CHEST TRAUMA       -ARRYHTHMIAS       -LATE PRESENTING MYOCARDIAL INFARCTION       -COPD   CLINICAL FOLLOW-UP RECOMMENDED.   Blood gas, venous     Status: Abnormal (Preliminary result)   Collection Time: 08/02/15  5:50 AM  Result Value Ref Range   pH, Ven 7.34 7.320 - 7.430   pCO2, Ven 53 44.0 - 60.0 mmHg   pO2, Ven PENDING 30.0 - 45.0 mmHg   Bicarbonate 28.6 (H) 21.0 - 28.0 mEq/L   Acid-Base Excess 1.8 0.0 - 3.0 mmol/L   Patient temperature 37.0    Collection site VENOUS    Sample type VENOUS    Dg Chest Portable 1 View  08/02/2015  CLINICAL DATA:  Acute onset of shortness of breath and congested cough. Initial encounter. EXAM: PORTABLE CHEST 1 VIEW COMPARISON:  Chest radiograph performed 09/21/2013, and CT of the chest performed 04/20/2014 FINDINGS: The lungs are well-aerated and clear. There is no evidence of focal opacification, pleural effusion or pneumothorax. The cardiomediastinal silhouette is within normal limits. No acute osseous abnormalities are seen. IMPRESSION: No acute cardiopulmonary process seen. Electronically Signed   By: Garald Balding M.D.   On: 08/02/2015 03:09    Review of Systems  Constitutional: Negative for fever and chills.  HENT: Negative for sore throat and tinnitus.   Eyes: Negative for blurred vision and redness.  Respiratory: Positive for cough and shortness of breath.   Cardiovascular: Negative for chest pain, palpitations, orthopnea and PND.  Gastrointestinal: Negative for nausea, vomiting, abdominal pain and diarrhea.  Genitourinary: Negative for dysuria, urgency and frequency.  Musculoskeletal: Negative for myalgias and joint pain.  Skin: Negative for rash.       No lesions  Neurological: Negative for speech change, focal weakness and weakness.  Endo/Heme/Allergies: Does not bruise/bleed easily.       No temperature intolerance   Psychiatric/Behavioral: Negative for depression and suicidal ideas.    Blood pressure 133/83, pulse 110, temperature 98 F (36.7 C), resp. rate 24, height $RemoveBe'5\' 11"'ULSMXswUM$  (1.803 m), weight 79.379 kg (175 lb), SpO2 86 %. Physical Exam  Nursing note and vitals reviewed. Constitutional: He appears well-developed and well-nourished. No  distress.  HENT:  Head: Normocephalic and atraumatic.  Mouth/Throat: Oropharynx is clear and moist.  Eyes: Conjunctivae and EOM are normal. Pupils are equal, round, and reactive to light. No scleral icterus.  Neck: Normal range of motion. Neck supple. No JVD present. No tracheal deviation present. No thyromegaly present.  Cardiovascular: Normal rate, regular rhythm and normal heart sounds.  Exam reveals no gallop and no friction rub.   No murmur heard. Respiratory: He has wheezes.  GI: Soft. Bowel sounds are normal. He exhibits distension. There is no tenderness. There is no rebound and no guarding.  Genitourinary:  Deferred  Musculoskeletal: Normal range of motion. He exhibits no edema.  Lymphadenopathy:    He has no cervical adenopathy.  Skin: Skin is warm. No rash noted. He is diaphoretic. No erythema.  Psychiatric: He has a normal mood and affect. His behavior is normal. Judgment and thought content normal.     Assessment/Plan This is an 81 year old Caucasian male admitted for acute on chronic respiratory failure with hypoxia. 1. Acute on chronic respiratory failure: The patient was hypoxic to 86% on room air. Currently he is comfortable on 2-4 L of oxygen via nasal cannula. I started the patient on Levaquin and a steroid taper as empiric treatment for COPD exacerbation. We will continue supplemental oxygen as needed and wean as tolerated. Notably, the patient has never been a smoker and seems to be more characteristic of COPD than asthma. He does not know triggers for the latter. 2. Asthma: Likely mild persistent given the patient's chronic oral steroid use,  however this is again more consistent with COPD than asthma. He had been using to [facility] for maintenance control. He may benefit from a long-acting bronchial agonist. Combivent as needed while hospitalized. Pulmonary consult at discretion of the primary team. 3. Coronary artery disease: Stable ; no chest pain. Troponin was barely elevated thus we will follow his cardiac enzymes. Will not place patient on aspirin at this time as it is unclear if he would generally has a diagnosis of asthma or COPD. 4. Atrial fibrillation: Continue diltiazem 5. Diabetes mellitus type 2: Hold oral hypoglycemics. Add sliding scale insulin while hospitalized 6. DVT prophylaxis: Heparin 7. GI prophylaxis: None The patient is a full code. Time spent on admission orders and patient care proximally 45 minutes  Harrie Foreman 08/02/2015, 7:13 AM

## 2015-08-03 LAB — GLUCOSE, CAPILLARY: Glucose-Capillary: 274 mg/dL — ABNORMAL HIGH (ref 65–99)

## 2015-08-03 MED ORDER — LEVOFLOXACIN 500 MG PO TABS: 500.0000 mg | ORAL_TABLET | Freq: Every day | ORAL | Status: DC

## 2015-08-03 MED ORDER — PREDNISONE 10 MG PO TABS: 10.0000 mg | ORAL_TABLET | Freq: Every day | ORAL | Status: DC

## 2015-08-03 NOTE — Progress Notes (Signed)
Discharge instructions and prescriptions given with verbalized understanding from patient.  VSS.  Patient denies pain at discharge.  Patient waiting on wife for transportation.

## 2015-08-03 NOTE — Discharge Summary (Signed)
[facility] - New Site at [facility]   PATIENT NAME: George Hensley    MR#:  [id number]  DATE OF BIRTH:  Apr 18, 1931  DATE OF ADMISSION:  08/02/2015 ADMITTING PHYSICIAN: George Natal, MD DATE OF DISCHARGE: 08/03/2015  PRIMARY CARE PHYSICIAN: George Penton., MD    ADMISSION DIAGNOSIS:  Bronchitis [J40] Hypoxia [R09.02]  DISCHARGE DIAGNOSIS:  Active Problems:   Acute on chronic respiratory failure with hypoxia (HCC)   SECONDARY DIAGNOSIS:   Past Medical History  Diagnosis Date  . Asthma   . Diabetes mellitus     HOSPITAL COURSE:    81 year old male with a history of asthma who presented with acute  hypoxic respiratory failure. For further details please refer the H&P.  1. Acute hypoxic respiratory failure: This was secondary to be from COPD exacerbation. Patient has been weaned off of oxygen.  2. COPD exacerbation: Patient has done well with IV steroids and antibiotics. Patient will be discharged on a steroid taper.  3. Coronary artery disease: Patient will continue on aspirin  4. History of atrial fibrillation: Continue diltiazem. Heart rate was controlled. Continue aspirin  5. Diabetes: Patient will need to follow-up with his outpatient physician for his diabetes. Blood sugars were more elevated than usual due to the IV steroids.   DISCHARGE CONDITIONS AND DIET:  Patient will be discharged home in stable condition on a heart healthy diet and diabetic diet  CONSULTS OBTAINED:     DRUG ALLERGIES:  No Known Allergies  DISCHARGE MEDICATIONS:   Current Discharge Medication List    START taking these medications   Details  levofloxacin (LEVAQUIN) 500 MG tablet Take 1 tablet (500 mg total) by mouth daily. Qty: 4 tablet, Refills: 0      CONTINUE these medications which have CHANGED   Details  predniSONE (DELTASONE) 10 MG tablet Take 1 tablet (10 mg total) by mouth daily with breakfast. Qty: 20 tablet, Refills: 0      CONTINUE  these medications which have NOT CHANGED   Details  diltiazem (CARDIZEM LA) 120 MG 24 hr tablet Take 120 mg by mouth daily.    glimepiride (AMARYL) 4 MG tablet Take 4 mg by mouth 2 (two) times daily.    metFORMIN (GLUCOPHAGE) 1000 MG tablet Take 1,000 mg by mouth 2 (two) times daily.    Mometasone Furo-Formoterol Fum (DULERA) 200-5 MCG/ACT AERO Inhale 2 puffs into the lungs 2 (two) times daily. Qty: 1 Inhaler, Refills: 3    pravastatin (PRAVACHOL) 10 MG tablet Take 10 mg by mouth daily.      STOP taking these medications     azithromycin (ZITHROMAX) 250 MG tablet      diltiazem (DILACOR XR) 120 MG 24 hr capsule               Today   CHIEF COMPLAINT:  Patient is doing well this morning. Patient is ready to be discharged home. Patient feels at his baseline.   VITAL SIGNS:  Blood pressure 123/68, pulse 86, temperature 97.8 F (36.6 C), temperature source Oral, resp. rate 18, height  (1.803 m), weight 75.439 kg (166 lb 5 oz), SpO2 95 %.   REVIEW OF SYSTEMS:  Review of Systems  Constitutional: Negative for fever, chills and malaise/fatigue.  HENT: Negative for sore throat.   Eyes: Negative for blurred vision.  Respiratory: Negative for cough, hemoptysis, shortness of breath and wheezing.   Cardiovascular: Negative for chest pain, palpitations and leg swelling.  Gastrointestinal: Negative for nausea, vomiting, abdominal pain,  diarrhea and blood in stool.  Genitourinary: Negative for dysuria.  Musculoskeletal: Negative for back pain.  Neurological: Negative for dizziness, tremors and headaches.  Endo/Heme/Allergies: Does not bruise/bleed easily.     PHYSICAL EXAMINATION:  GENERAL:  81 y.o.-year-old patient lying in the bed with no acute distress.  NECK:  Suppl49e, no jugular venous distention. No thyroid enlargement, no tenderness.  LUNGS: Normal breath sounds bilaterally, minimal expiratory wheezing right side no  rales,rhonchi  No use of accessory muscles of  respiration.  good air movement  CARDIOVASCULAR: S1, S2 normal. No murmurs, rubs, or gallops.  ABDOMEN: Soft, non-tender, non-distended. Bowel sounds present. No organomegaly or mass.  EXTREMITIES: No pedal edema, cyanosis, or clubbing.  PSYCHIATRIC: The patient is alert and oriented x 3.  SKIN: No obvious rash, lesion, or ulcer.   DATA REVIEW:   CBC  Recent Labs Lab 08/02/15 0250  WBC 7.7  HGB 15.7  HCT 47.5  PLT 219    Chemistries   Recent Labs Lab 08/02/15 0250  NA 138  K 4.0  CL 101  CO2 31  GLUCOSE 256*  BUN 13  CREATININE 1.10  CALCIUM 8.9  AST 17  ALT 17  ALKPHOS 59  BILITOT 1.1    Cardiac Enzymes  Recent Labs Lab 08/02/15 0920 08/02/15 1445 08/02/15 2243  TROPONINI 0.03 <0.03 <0.03    Microbiology Results  @[email or web address] @  RADIOLOGY:  Dg Chest Portable 1 View  08/02/2015  CLINICAL DATA:  Acute onset of shortness of breath and congested cough. Initial encounter. EXAM: PORTABLE CHEST 1 VIEW COMPARISON:  Chest radiograph performed 09/21/2013, and CT of the chest performed 04/20/2014 FINDINGS: The lungs are well-aerated and clear. There is no evidence of focal opacification, pleural effusion or pneumothorax. The cardiomediastinal silhouette is within normal limits. No acute osseous abnormalities are seen. IMPRESSION: No acute cardiopulmonary process seen. Electronically Signed   By: Roanna RaiderJeffery  Chang M.D.   On: 08/02/2015 03:09      Management plans discussed with the patient and he is in agreement. Stable for discharge  Patient should follow up witPCP  CODE STATUS:     Code Status Orders        Start     Ordered   08/02/15 0820  Full code   Continuous     08/02/15 0819    Advance Directive Documentation        Most Recent Value   Type of Advance Directive  Living will   Pre-existing out of facility DNR order (yellow form or pink MOST form)     "MOST" Form in Place?        TOTAL TIME TAKING CARE OF THIS PATIENT 35 minutes.    Note:  This dictation was prepared with Dragon dictation along with smaller phrase technology. Any transcriptional errors that result from this process are unintentional.  Aahan Marques M.D on 08/03/2015 at 11:16 AM  Between 7am to 6pm - Pager - [phone] After 6pm go to [email or web address] - password [email or web address]  [facility]  Office  [phone]  CC: Primary care physician; George PentonMILLER,MARK F., MD

## 2015-08-03 NOTE — Progress Notes (Signed)
Patient taken to visitors entrance via wheelchair to be taken home by wife via personal vehicle.

## 2015-08-03 NOTE — Progress Notes (Signed)
Pt denies pain. Has no complaints. O2 sat 90% on room air. No acute distress noted

## 2015-08-03 NOTE — Progress Notes (Signed)
   08/03/15 1029  Clinical Encounter Type  Visited With Patient  Visit Type Initial  Stress Factors  Patient Stress Factors None identified  Chaplain rounded in unit and offered pastoral care.   Chaplain Jahon Bart [phone]

## 2016-06-12 ENCOUNTER — Emergency Department: Payer: Medicare Other

## 2016-06-12 ENCOUNTER — Inpatient Hospital Stay: Payer: Medicare Other

## 2016-06-12 ENCOUNTER — Inpatient Hospital Stay
Admission: EM | Admit: 2016-06-12 | Discharge: 2016-06-13 | DRG: 190 | Disposition: A | Discharge status: Home / Self Care | Payer: Medicare Other | Attending: Internal Medicine | Admitting: Internal Medicine

## 2016-06-12 DIAGNOSIS — J40 Bronchitis, not specified as acute or chronic: Secondary | ICD-10-CM | POA: Diagnosis present

## 2016-06-12 DIAGNOSIS — R0902 Hypoxemia: Secondary | ICD-10-CM | POA: Diagnosis not present

## 2016-06-12 DIAGNOSIS — Z794 Long term (current) use of insulin: Secondary | ICD-10-CM | POA: Diagnosis not present

## 2016-06-12 DIAGNOSIS — Z825 Family history of asthma and other chronic lower respiratory diseases: Secondary | ICD-10-CM

## 2016-06-12 DIAGNOSIS — J9601 Acute respiratory failure with hypoxia: Secondary | ICD-10-CM | POA: Diagnosis present

## 2016-06-12 DIAGNOSIS — Z7984 Long term (current) use of oral hypoglycemic drugs: Secondary | ICD-10-CM | POA: Diagnosis not present

## 2016-06-12 DIAGNOSIS — Z720 Tobacco use: Secondary | ICD-10-CM

## 2016-06-12 DIAGNOSIS — Z2239 Carrier of other specified bacterial diseases: Secondary | ICD-10-CM

## 2016-06-12 DIAGNOSIS — Z79899 Other long term (current) drug therapy: Secondary | ICD-10-CM

## 2016-06-12 DIAGNOSIS — J4541 Moderate persistent asthma with (acute) exacerbation: Secondary | ICD-10-CM | POA: Diagnosis not present

## 2016-06-12 DIAGNOSIS — Z7951 Long term (current) use of inhaled steroids: Secondary | ICD-10-CM

## 2016-06-12 DIAGNOSIS — E119 Type 2 diabetes mellitus without complications: Secondary | ICD-10-CM | POA: Diagnosis present

## 2016-06-12 DIAGNOSIS — Z7952 Long term (current) use of systemic steroids: Secondary | ICD-10-CM

## 2016-06-12 DIAGNOSIS — J470 Bronchiectasis with acute lower respiratory infection: Secondary | ICD-10-CM | POA: Diagnosis present

## 2016-06-12 DIAGNOSIS — R0602 Shortness of breath: Secondary | ICD-10-CM

## 2016-06-12 DIAGNOSIS — Z955 Presence of coronary angioplasty implant and graft: Secondary | ICD-10-CM

## 2016-06-12 DIAGNOSIS — E785 Hyperlipidemia, unspecified: Secondary | ICD-10-CM | POA: Diagnosis present

## 2016-06-12 DIAGNOSIS — J471 Bronchiectasis with (acute) exacerbation: Secondary | ICD-10-CM | POA: Diagnosis not present

## 2016-06-12 DIAGNOSIS — J189 Pneumonia, unspecified organism: Secondary | ICD-10-CM | POA: Diagnosis present

## 2016-06-12 DIAGNOSIS — Z9049 Acquired absence of other specified parts of digestive tract: Secondary | ICD-10-CM

## 2016-06-12 DIAGNOSIS — J439 Emphysema, unspecified: Secondary | ICD-10-CM | POA: Diagnosis present

## 2016-06-12 LAB — GLUCOSE, CAPILLARY
GLUCOSE-CAPILLARY: 415 mg/dL — AB (ref 65–99)
Glucose-Capillary: 365 mg/dL — ABNORMAL HIGH (ref 65–99)
Glucose-Capillary: 189 mg/dL — ABNORMAL HIGH (ref 65–99)

## 2016-06-12 LAB — BLOOD GAS, VENOUS
ACID-BASE EXCESS: 2.1 mmol/L — AB (ref 0.0–2.0)
BICARBONATE: 29.1 mmol/L — AB (ref 20.0–28.0)
O2 Saturation: 83.3 %
PCO2 VEN: 54 mmHg (ref 44.0–60.0)
PH VEN: 7.34 (ref 7.250–7.430)
PO2 VEN: 51 mmHg — AB (ref 32.0–45.0)
Patient temperature: 37

## 2016-06-12 LAB — BASIC METABOLIC PANEL
Anion gap: 9 (ref 5–15)
BUN: 15 mg/dL (ref 6–20)
CO2: 28 mmol/L (ref 22–32)
Calcium: 9.2 mg/dL (ref 8.9–10.3)
Chloride: 102 mmol/L (ref 101–111)
Creatinine, Ser: 1.05 mg/dL (ref 0.61–1.24)
GFR calc Af Amer: >60 mL/min (ref >60)
GFR calc non Af Amer: >60 mL/min (ref >60)
Glucose, Bld: 198 mg/dL — ABNORMAL HIGH (ref 65–99)
Potassium: 4.5 mmol/L (ref 3.5–5.1)
Sodium: 139 mmol/L (ref 135–145)

## 2016-06-12 LAB — CBC
HEMATOCRIT: 44.5 % (ref 40.0–52.0)
HEMOGLOBIN: 15.1 g/dL (ref 13.0–18.0)
MCH: 32.1 pg (ref 26.0–34.0)
MCHC: 34 g/dL (ref 32.0–36.0)
MCV: 94.4 fL (ref 80.0–100.0)
Platelets: 179 10*3/uL (ref 150–440)
RBC: 4.71 MIL/uL (ref 4.40–5.90)
RDW: 13.5 % (ref 11.5–14.5)
WBC: 12.1 10*3/uL — AB (ref 3.8–10.6)

## 2016-06-12 LAB — TROPONIN I: Troponin I: <0.03 ng/mL (ref <0.03)

## 2016-06-12 MED ORDER — ENOXAPARIN SODIUM 40 MG/0.4ML ~~LOC~~ SOLN
40.0000 mg | Freq: Every morning | SUBCUTANEOUS | Status: DC
  Administered 2016-06-12 – 2016-06-13 (×2): 40 mg via SUBCUTANEOUS
  Filled 2016-06-12 (×2): qty 0.4

## 2016-06-12 MED ORDER — PRAVASTATIN SODIUM 10 MG PO TABS
10.0000 mg | ORAL_TABLET | Freq: Every day | ORAL | Status: DC
  Administered 2016-06-12 – 2016-06-13 (×2): 10 mg via ORAL
  Filled 2016-06-12 (×2): qty 1

## 2016-06-12 MED ORDER — METHYLPREDNISOLONE SODIUM SUCC 125 MG IJ SOLR
125.0000 mg | Freq: Once | INTRAMUSCULAR | Status: AC
Start: 2016-06-12 — End: 2016-06-12
  Administered 2016-06-12: 125 mg via INTRAVENOUS
  Filled 2016-06-12: qty 2

## 2016-06-12 MED ORDER — ORAL CARE MOUTH RINSE
15.0000 mL | Freq: Two times a day (BID) | OROMUCOSAL | Status: DC
  Administered 2016-06-12 – 2016-06-13 (×2): 15 mL via OROMUCOSAL

## 2016-06-12 MED ORDER — METHYLPREDNISOLONE SODIUM SUCC 40 MG IJ SOLR
40.0000 mg | Freq: Every day | INTRAMUSCULAR | Status: DC
  Administered 2016-06-13: 40 mg via INTRAVENOUS
  Filled 2016-06-12: qty 1

## 2016-06-12 MED ORDER — MAGNESIUM SULFATE 2 GM/50ML IV SOLN
2.0000 g | Freq: Once | INTRAVENOUS | Status: AC
  Administered 2016-06-12: 2 g via INTRAVENOUS
  Filled 2016-06-12: qty 50

## 2016-06-12 MED ORDER — SODIUM CHLORIDE 0.9 % IV SOLN
INTRAVENOUS | Status: DC
  Administered 2016-06-12 (×2): via INTRAVENOUS

## 2016-06-12 MED ORDER — SODIUM CHLORIDE 0.9% FLUSH
3.0000 mL | Freq: Two times a day (BID) | INTRAVENOUS | Status: DC
  Administered 2016-06-12 – 2016-06-13 (×2): 3 mL via INTRAVENOUS

## 2016-06-12 MED ORDER — ONDANSETRON HCL 4 MG PO TABS: 4.0000 mg | ORAL_TABLET | Freq: Four times a day (QID) | ORAL | Status: DC | PRN

## 2016-06-12 MED ORDER — ONDANSETRON HCL 4 MG/2ML IJ SOLN: 4.0000 mg | Freq: Four times a day (QID) | INTRAMUSCULAR | Status: DC | PRN

## 2016-06-12 MED ORDER — DILTIAZEM HCL ER COATED BEADS 120 MG PO CP24
120.0000 mg | ORAL_CAPSULE | Freq: Every day | ORAL | Status: DC
  Administered 2016-06-12 – 2016-06-13 (×2): 120 mg via ORAL
  Filled 2016-06-12 (×3): qty 1

## 2016-06-12 MED ORDER — ACETAMINOPHEN 650 MG RE SUPP: 650.0000 mg | Freq: Four times a day (QID) | RECTAL | Status: DC | PRN

## 2016-06-12 MED ORDER — IOPAMIDOL (ISOVUE-370) INJECTION 76%
75.0000 mL | Freq: Once | INTRAVENOUS | Status: AC | PRN
  Administered 2016-06-12: 75 mL via INTRAVENOUS

## 2016-06-12 MED ORDER — SENNOSIDES-DOCUSATE SODIUM 8.6-50 MG PO TABS
1.0000 | ORAL_TABLET | Freq: Every evening | ORAL | Status: DC | PRN
Start: 2016-06-12 — End: 2016-06-13

## 2016-06-12 MED ORDER — MOMETASONE FURO-FORMOTEROL FUM 200-5 MCG/ACT IN AERO
2.0000 | INHALATION_SPRAY | Freq: Every morning | RESPIRATORY_TRACT | Status: DC
  Administered 2016-06-12 – 2016-06-13 (×2): 2 via RESPIRATORY_TRACT
  Filled 2016-06-12: qty 8.8

## 2016-06-12 MED ORDER — DEXTROSE 5 % IV SOLN
2.0000 g | Freq: Two times a day (BID) | INTRAVENOUS | Status: AC
  Administered 2016-06-12 – 2016-06-13 (×2): 2 g via INTRAVENOUS
  Filled 2016-06-12 (×3): qty 2

## 2016-06-12 MED ORDER — INSULIN ASPART 100 UNIT/ML ~~LOC~~ SOLN: 0.0000 [IU] | Freq: Every day | SUBCUTANEOUS | Status: DC

## 2016-06-12 MED ORDER — ACETAMINOPHEN 325 MG PO TABS: 650.0000 mg | ORAL_TABLET | Freq: Four times a day (QID) | ORAL | Status: DC | PRN

## 2016-06-12 MED ORDER — ALBUTEROL SULFATE (2.5 MG/3ML) 0.083% IN NEBU
2.5000 mg | INHALATION_SOLUTION | Freq: Once | RESPIRATORY_TRACT | Status: AC
  Administered 2016-06-12: 2.5 mg via RESPIRATORY_TRACT
  Filled 2016-06-12: qty 3

## 2016-06-12 MED ORDER — IPRATROPIUM-ALBUTEROL 0.5-2.5 (3) MG/3ML IN SOLN
3.0000 mL | Freq: Four times a day (QID) | RESPIRATORY_TRACT | Status: DC
  Administered 2016-06-12 – 2016-06-13 (×5): 3 mL via RESPIRATORY_TRACT
  Filled 2016-06-12 (×5): qty 3

## 2016-06-12 MED ORDER — IPRATROPIUM-ALBUTEROL 0.5-2.5 (3) MG/3ML IN SOLN
3.0000 mL | Freq: Once | RESPIRATORY_TRACT | Status: AC
  Administered 2016-06-12: 3 mL via RESPIRATORY_TRACT
  Filled 2016-06-12: qty 3

## 2016-06-12 MED ORDER — CEFEPIME-DEXTROSE 1 GM/50ML IV SOLR
1.0000 g | Freq: Once | INTRAVENOUS | Status: AC
  Administered 2016-06-12: 1 g via INTRAVENOUS
  Filled 2016-06-12: qty 50

## 2016-06-12 MED ORDER — METHYLPREDNISOLONE SODIUM SUCC 125 MG IJ SOLR
60.0000 mg | Freq: Three times a day (TID) | INTRAMUSCULAR | Status: DC
  Filled 2016-06-12: qty 2

## 2016-06-12 MED ORDER — GLIMEPIRIDE 2 MG PO TABS
4.0000 mg | ORAL_TABLET | Freq: Two times a day (BID) | ORAL | Status: DC
Start: 2016-06-12 — End: 2016-06-13
  Administered 2016-06-12 – 2016-06-13 (×3): 4 mg via ORAL
  Filled 2016-06-12 (×3): qty 2

## 2016-06-12 MED ORDER — INSULIN ASPART 100 UNIT/ML ~~LOC~~ SOLN
0.0000 [IU] | Freq: Three times a day (TID) | SUBCUTANEOUS | Status: DC
  Administered 2016-06-12 (×2): 20 [IU] via SUBCUTANEOUS
  Administered 2016-06-13: 7 [IU] via SUBCUTANEOUS
  Filled 2016-06-12 (×2): qty 20
  Filled 2016-06-12: qty 7

## 2016-06-12 NOTE — Progress Notes (Signed)
Pharmacy Antibiotic Note  George Hensley is a 82 y.o. male admitted on 06/12/2016 with hypoxia.  Pharmacy has been consulted for Cefepime  dosing.  Plan: Will start Cefepime 2 g IV q12 hours.   Height: 5\' 10"  (177.8 cm) Weight: 170 lb (77.1 kg) IBW/kg (Calculated) : 73  Temp (24hrs), Avg:97.8 F (36.6 C), Min:97.6 F (36.4 C), Max:98 F (36.7 C)   Recent Labs Lab 06/12/16 0214  WBC 12.1*  CREATININE 1.05    Estimated Creatinine Clearance: 56 mL/min (by C-G formula based on SCr of 1.05 mg/dL).    No Known Allergies  Antimicrobials this admission: Cefepime  10.16 >>    >>  Dose adjustments this admission:   Microbiology results:  BCx: NGTD   UCx:    Sputum:   MRSA PCR:   Thank you for allowing pharmacy to be a part of this patient's care.  Savaughn,Tangi Shroff D 06/12/2016 10:14 AM

## 2016-06-12 NOTE — ED Provider Notes (Signed)
[facility] Emergency Department Provider Note   ____________________________________________   First MD Initiated Contact with Patient 06/12/16 [phone]     (approximate)  I have reviewed the triage vital signs and the nursing notes.   HISTORY  Chief Complaint Shortness of Breath    HPI George Hensley is a 82 y.o. male who comes into the hospital today with difficulty breathing. The patient does have a history of bronchiectasis. He reports that the symptoms started yesterday but it seemed to get worse overnight. The patient's pulmonologist is at [facility]. He does have a cough that is productive of yellow sputum. He has been using his inhalers as well as his emergency inhaler. He reports that he did have some mild relief but it just returned. The patient denies any fevers, chest pain. He reports that he's had no sick contacts but his wife reports his bronchioles are infected with Pseudomonas. The patient is here tonight for evaluation of his symptoms. Had any vomiting and denies any abdominal pain and headache blurred vision back pain or any other concerns.   Past Medical History:  Diagnosis Date  . Asthma   . Diabetes mellitus     Patient Active Problem List   Diagnosis Date Noted  . Acute on chronic respiratory failure with hypoxia (HCC) 08/02/2015  . Asthma 04/04/2011  . Pulmonary nodule 03/06/2011    Past Surgical History:  Procedure Laterality Date  . CHOLECYSTECTOMY  2000  . CORONARY STENT PLACEMENT  1999    Prior to Admission medications   Medication Sig Start Date End Date Taking? Authorizing Provider  diltiazem (CARDIZEM LA) 120 MG 24 hr tablet Take 120 mg by mouth daily.    Historical Provider, MD  glimepiride (AMARYL) 4 MG tablet Take 4 mg by mouth 2 (two) times daily.    Historical Provider, MD  levofloxacin (LEVAQUIN) 500 MG tablet Take 1 tablet (500 mg total) by mouth daily. 08/03/15   Adrian Saran, MD  metFORMIN (GLUCOPHAGE) 1000 MG tablet  Take 1,000 mg by mouth 2 (two) times daily.    Historical Provider, MD  Mometasone Furo-Formoterol Fum (DULERA) 200-5 MCG/ACT AERO Inhale 2 puffs into the lungs 2 (two) times daily. Patient taking differently: Inhale 2 puffs into the lungs every morning.  07/02/11   Nyoka Cowden, MD  pravastatin (PRAVACHOL) 10 MG tablet Take 10 mg by mouth daily.    Historical Provider, MD  predniSONE (DELTASONE) 10 MG tablet Take 1 tablet (10 mg total) by mouth daily with breakfast. 08/03/15   Adrian Saran, MD    Allergies Review of patient's allergies indicates no known allergies.  Family History  Problem Relation Age of Onset  . Asthma Brother     Social History Social History  Substance Use Topics  . Smoking status: Never Smoker  . Smokeless tobacco: Current User    Types: Chew  . Alcohol use Yes     Comment: socially last drink 2 weeks ago    Review of Systems Constitutional: No fever/chills Eyes: No visual changes. ENT: No sore throat. Cardiovascular: Denies chest pain. Respiratory: Cough and shortness of breath. Gastrointestinal: No abdominal pain.  No nausea, no vomiting.  No diarrhea.  No constipation. Genitourinary: Negative for dysuria. Musculoskeletal: Negative for back pain. Skin: Negative for rash. Neurological: Negative for headaches, focal weakness or numbness.  10-point ROS otherwise negative.  ____________________________________________   PHYSICAL EXAM:  VITAL SIGNS: ED Triage Vitals  Enc Vitals Group     BP 06/12/16 0203 Marland Kitchen)  154/77     Pulse Rate 06/12/16 0203 (!) 114     Resp 06/12/16 0203 (!) 22     Temp 06/12/16 0203 98 F (36.7 C)     Temp Source 06/12/16 0203 Oral     SpO2 06/12/16 0203 (!) 87 %     Weight 06/12/16 0202 170 lb (77.1 kg)     Height 06/12/16 0202 5\' 10"  (1.778 m)     Head Circumference --      Peak Flow --      Pain Score 06/12/16 0226 0     Pain Loc --      Pain Edu? --      Excl. in GC? --     Constitutional: Alert and oriented.  Well appearing and in Moderate distress. Eyes: Conjunctivae are normal. PERRL. EOMI. Head: Atraumatic. Nose: No congestion/rhinnorhea. Mouth/Throat: Mucous membranes are moist.  Oropharynx non-erythematous. Cardiovascular: Normal rate, regular rhythm. Grossly normal heart sounds.  Good peripheral circulation. Respiratory: Increased respiratory effort.  No retractions. Expiratory wheezing throughout all lung fields Gastrointestinal: Soft and nontender. No distention. Positive bowel sounds Musculoskeletal: No lower extremity tenderness nor edema.  . Neurologic:  Normal speech and language.  Skin:  Skin is warm, dry and intact.  Psychiatric: Mood and affect are normal.   ____________________________________________   LABS (all labs ordered are listed, but only abnormal results are displayed)  Labs Reviewed  CBC - Abnormal; Notable for the following:       Result Value   WBC 12.1 (*)    All other components within normal limits  BASIC METABOLIC PANEL - Abnormal; Notable for the following:    Glucose, Bld 198 (*)    All other components within normal limits  BLOOD GAS, VENOUS - Abnormal; Notable for the following:    pO2, Ven 51.0 (*)    Bicarbonate 29.1 (*)    Acid-Base Excess 2.1 (*)    All other components within normal limits  CULTURE, BLOOD (ROUTINE X 2)  CULTURE, BLOOD (ROUTINE X 2)  TROPONIN I   ____________________________________________  EKG  ED ECG REPORT I, Rebecka ApleyWebster,  Allison P, the attending physician, personally viewed and interpreted this ECG.   Date: 06/12/2016  EKG Time: 210  Rate: 103  Rhythm: sinus tachycardia  Axis: left axis deviation  Intervals:none  ST&T Change: Flipped T waves in leads aVL and T-wave flattening in lead I.  ____________________________________________  RADIOLOGY  CXR ____________________________________________   PROCEDURES  Procedure(s) performed: None  Procedures  Critical Care performed:  No  ____________________________________________   INITIAL IMPRESSION / ASSESSMENT AND PLAN / ED COURSE  Pertinent labs & imaging results that were available during my care of the patient were reviewed by me and considered in my medical decision making (see chart for details).  This is an 82 year old male who comes into the hospital today with some shortness of breath. The patient has had some productive cough. He does have a history of bronchiectasis with some wheezing. I will give the patient a DuoNeb he meant as well as some Solu-Medrol. I will await the results of that x-ray as well as the blood work. The patient was hypoxic when he initially arrived. I will reassess the patient once he received the medication and I have received his results.  Clinical Course  Value Comment By Time  DG Chest Portable 1 View No acute cardiopulmonary process.  Emphysema.   Rebecka ApleyAllison P Webster, MD 10/17 [phone]    After the DuoNeb and Solu-Medrol we did  attempt to wean the patient's O2 but he had O2 sats in the high 80s. I gave the patient another albuterol as he still had some tight wheezing as well as some magnesium sulfate. I will admit the patient to the hospitalist service for further evaluation. Given the patient's colonization with pseudomonas I will give the patient a dose of cefepime as well. I explained this to the patient and his wife and they have no further complaints or concerns. ____________________________________________   FINAL CLINICAL IMPRESSION(S) / ED DIAGNOSES  Final diagnoses:  Hypoxia  Bronchitis  SOB (shortness of breath)      NEW MEDICATIONS STARTED DURING THIS VISIT:  New Prescriptions   No medications on file     Note:  This document was prepared using Dragon voice recognition software and may include unintentional dictation errors.    Rebecka Apley, MD 06/12/16 [phone]

## 2016-06-12 NOTE — Progress Notes (Signed)
06/12/2016  12:07 PM  Pt CBG was 415.  Notified attending MD Mody.  Mody instructed to give 20 units sliding scale novolog.  MD opined that high CBG was likely due to earlier administration of solu-medrol.  Did not ask for lab verification.  Will continue to monitor and assess.  Bradly Chrisougherty, Peretz Thieme E, RN

## 2016-06-12 NOTE — H&P (Signed)
[facility] - [place] [facility]   PATIENT NAME: George Hensley    MR#:  [id number]  DATE OF BIRTH:  1931-05-27  DATE OF ADMISSION:  06/12/2016  PRIMARY CARE PHYSICIAN: Danella Penton, MD   REQUESTING/REFERRING PHYSICIAN: dr Zenda Alpers  CHIEF COMPLAINT:   sob HISTORY OF PRESENT ILLNESS:  George Hensley  is a 82 y.o. male with a known history of Bronchiectasis currently receiving care at [facility] and diabetes who presents above complaint. Patient reports over the past few days he has had increasing shortness of breath with cough and increased purulent sputum. 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 The Pseudomonas is resistant to Levaquin however as per pulmonary notes Levaquin has worked for him in the past. Patient was found to have persistent hypoxia and therefore will need admission to the hospital. Patient denies sick contacts, fever or chills.  PAST MEDICAL HISTORY:   Past Medical History:  Diagnosis Date  . Asthma   . Diabetes mellitus     PAST SURGICAL HISTORY:   Past Surgical History:  Procedure Laterality Date  . CHOLECYSTECTOMY  2000  . CORONARY STENT PLACEMENT  1999    SOCIAL HISTORY:   Social History  Substance Use Topics  . Smoking status: Never Smoker  . Smokeless tobacco: Current User    Types: Chew  . Alcohol use Yes     Comment: socially last drink 2 weeks ago    FAMILY HISTORY:   Family History  Problem Relation Age of Onset  . Asthma Brother     DRUG ALLERGIES:  No Known Allergies  REVIEW OF SYSTEMS:   Review of Systems  Constitutional: Negative for chills, fever and malaise/fatigue.  HENT: Negative.  Negative for ear discharge, ear pain, hearing loss, nosebleeds and sore throat.   Eyes: Negative.  Negative for blurred vision and pain.  Respiratory: Positive for cough, sputum production and shortness of breath. Negative for hemoptysis and wheezing.   Cardiovascular: Negative.  Negative for chest pain,  palpitations and leg swelling.  Gastrointestinal: Negative.  Negative for abdominal pain, blood in stool, diarrhea, nausea and vomiting.  Genitourinary: Negative.  Negative for dysuria.  Musculoskeletal: Negative.  Negative for back pain.  Skin: Negative.   Neurological: Positive for weakness. Negative for dizziness, tremors, speech change, focal weakness, seizures and headaches.  Endo/Heme/Allergies: Negative.  Does not bruise/bleed easily.  Psychiatric/Behavioral: Negative.  Negative for depression, hallucinations and suicidal ideas.    MEDICATIONS AT HOME:   Prior to Admission medications   Medication Sig Start Date End Date Taking? Authorizing Provider  diltiazem (CARDIZEM LA) 120 MG 24 hr tablet Take 120 mg by mouth daily.   Yes Historical Provider, MD  glimepiride (AMARYL) 4 MG tablet Take 4 mg by mouth 2 (two) times daily.   Yes Historical Provider, MD  metFORMIN (GLUCOPHAGE) 1000 MG tablet Take 1,000 mg by mouth 2 (two) times daily.   Yes Historical Provider, MD  Mometasone Furo-Formoterol Fum (DULERA) 200-5 MCG/ACT AERO Inhale 2 puffs into the lungs 2 (two) times daily. 07/02/11  Yes Nyoka Cowden, MD  pravastatin (PRAVACHOL) 10 MG tablet Take 10 mg by mouth daily.   Yes Historical Provider, MD  tiotropium (SPIRIVA) 18 MCG inhalation capsule Place 18 mcg into inhaler and inhale daily.   Yes Historical Provider, MD      VITAL SIGNS:  Blood pressure 125/65, pulse 88, temperature 97.6 F (36.4 C), temperature source Oral, resp. rate 20, height 5\' 10"  (1.778 m), weight 77.1 kg (170  lb), SpO2 94 %.  PHYSICAL EXAMINATION:   Physical Exam  Constitutional: He is oriented to person, place, and time and well-developed, well-nourished, and in no distress. No distress.  HENT:  Head: Normocephalic.  Eyes: No scleral icterus.  Neck: Normal range of motion. Neck supple. No JVD present. No tracheal deviation present.  Cardiovascular: Normal rate, regular rhythm and normal heart sounds.   Exam reveals no gallop and no friction rub.   No murmur heard. Pulmonary/Chest: Effort normal. No respiratory distress. He has wheezes. He has rales. He exhibits no tenderness.  Abdominal: Soft. Bowel sounds are normal. He exhibits no distension and no mass. There is no tenderness. There is no rebound and no guarding.  Musculoskeletal: Normal range of motion. He exhibits no edema.  Neurological: He is alert and oriented to person, place, and time.  Skin: Skin is warm. No rash noted. No erythema.  Psychiatric: Affect and judgment normal.      LABORATORY PANEL:   CBC  Recent Labs Lab 06/12/16 0214  WBC 12.1*  HGB 15.1  HCT 44.5  PLT 179   ------------------------------------------------------------------------------------------------------------------  Chemistries   Recent Labs Lab 06/12/16 0214  NA 139  K 4.5  CL 102  CO2 28  GLUCOSE 198*  BUN 15  CREATININE 1.05  CALCIUM 9.2   ------------------------------------------------------------------------------------------------------------------  Cardiac Enzymes  Recent Labs Lab 06/12/16 0214  TROPONINI <0.03   ------------------------------------------------------------------------------------------------------------------  RADIOLOGY:  Ct Angio Chest Pe W Or Wo Contrast  Result Date: 06/12/2016 CLINICAL DATA:  Difficulty breathing and history of bronchiectasis EXAM: CT ANGIOGRAPHY CHEST WITH CONTRAST TECHNIQUE: Multidetector CT imaging of the chest was performed using the standard protocol during bolus administration of intravenous contrast. Multiplanar CT image reconstructions and MIPs were obtained to evaluate the vascular anatomy. CONTRAST:  75 mL Isovue 370. COMPARISON:  Plain film from earlier in the same day, 04/20/2014 FINDINGS: Cardiovascular: Calcific changes of the thoracic aorta and its branches are seen. No aneurysmal dilatation or dissection is noted. Coronary calcifications are seen. The cardiac  structures are otherwise within normal limits. The pulmonary artery as visualized shows a normal branching pattern. No definitive filling defect to suggest pulmonary embolism is noted. Mediastinum/Nodes: The thoracic inlet is within normal limits. No hilar or mediastinal adenopathy is seen. No axillary adenopathy is noted. Lungs/Pleura: Lungs are well aerated bilaterally. No focal confluent infiltrate is seen. Mild dependent atelectatic changes are noted. No focal pulmonary nodules are seen. Upper Abdomen: Scattered hypodensities are noted within the liver most consistent with cysts. No other focal abnormality in the upper abdomen is noted. The gallbladder has been surgically removed. Musculoskeletal: Degenerative change of the thoracic spine is noted. No acute abnormality is seen. Review of the MIP images confirms the above findings. IMPRESSION: No evidence of pulmonary emboli. Minimal dependent atelectatic changes. Hypodensities within the liver likely representing cysts. These are stable from the prior exam. Electronically Signed   By: Alcide CleverMark  Lukens M.D.   On: 06/12/2016 08:22   Dg Chest Portable 1 View  Result Date: 06/12/2016 CLINICAL DATA:  82 year old male with shortness of breath EXAM: PORTABLE CHEST 1 VIEW COMPARISON:  Chest radiograph dated 08/02/2015 FINDINGS: There is emphysematous changes of the lungs. No focal consolidation, pleural effusion, or pneumothorax. The cardiac silhouette is within normal limits. No acute osseous pathology. IMPRESSION: No acute cardiopulmonary process. Emphysema. Electronically Signed   By: Elgie CollardArash  Radparvar M.D.   On: 06/12/2016 02:58    EKG:   Orders placed or performed during the hospital encounter of 06/12/16  .  ED EKG  . ED EKG  . EKG 12-Lead  . EKG 12-Lead  . EKG 12-Lead  . EKG 12-Lead    IMPRESSION AND PLAN:   82 year old male with history of bronchiectasis who presents with acute hypoxic respiratory failure.  1. Acute hypoxic respiratory failure  due to pneumonia with bronchiectasis exacerbation Continue cefepime with history of Pseudomonas colonization. Wean oxygen as tolerated Pulmonary consultation Patient would benefit from percussion vest and inhale tobramycin which may be addressed as outpatient. Start IV steroids and can wean with a quick taper. Patient is on 10 mg of prednisone daily.  Follow up on sputum culture. 2. Acute bronchiectasis exacerbation: Patient has care at [facility]. Continue above treatment and patient will need outpatient follow-up at discharge.  3. Diabetes: Continue sliding scale insulin and Amaryl. ADA diet.  4. Hyperlipidemia: Continue pravastatin.   All the records are reviewed and case discussed with ED provider. Management plans discussed with the patient and he in agreement  CODE STATUS: full  TOTAL TIME TAKING CARE OF THIS PATIENT: 50 minutes.    Jenell Dobransky M.D on 06/12/2016 at 11:24 AM  Between 7am to 6pm - Pager - [phone]  After 6pm go to [email or web address] - Social research officer, government  [facility]  Office  [phone]  CC: Primary care physician; Danella Penton, MD

## 2016-06-12 NOTE — ED Notes (Addendum)
Pt resting comfortably with family at bedside ATT. O2 flow rate deceased from 4L/min to 2L/min via [place]; [place], MD made aware.

## 2016-06-12 NOTE — Consult Note (Signed)
[facility] George Hensley      Assessment  --Acute respiratory failure due to pneumonia with bronchiectasis exacerbation.  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   Plan  --Continue treatment with broad spectrum abx, for pseudomonas coverage.  --Check sputum culture.  --Patient was given my card if he would like to follow up with [place] locally, he can continue to follow with Dr. Sandy Hensley at [facility].  --Can consider further airway clearance techniques such as flutter valve, percussion vest, inhaled tobramycin are considerations for outpatient treatment.  --Prevnar-13 if not received already.   Pulmonary service will sign off for now, please call if there are any further questions or concerns.   Date: 06/12/2016  MRN# [id number] George Hensley 09-Nov-1930  Referring Physician:   JONAHTAN Hensley is a 82 y.o. old male seen in [place] for chief complaint of:    Chief Complaint  Patient presents with  . Shortness of Breath    HPI:   Pt is a 82 yo male with a history of bronchiectasis. He presented with symptoms of dyspnea. He has been having a cough with yellow sputum, which feels like his usual symptoms of bronchiectasis exacerbation which he has had in the past. He has a history of pseudomonas. He had sats in high 80's initially, and was placed on [place] at 2L in the ER with improvement in his oxygen saturations. He denies chest pain or hemoptysis. He is very conversational, he seems to be in good spirits, he is joking with the nurses, and does not appear to have dyspnea at this time.   Review of CT chest shows some mild scattered tree-in-bud changes peripherally, likely representing impacted mucus.   On review of his previous records, it would appear that he has been treated by Dr. Sandy Hensley at [facility]. He has pseudomonas colonization, which has been resistant to levaquin. 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   PFT and : 12/06/15; 6 minute walk without hypoxemia after walking 360 meters. FVC= 84%; FEV1=63% without significant reversibility. These numbers were significantly improved from previous when his bronchiectasis was not controlled. At his last visit they had discussed hypertonic saline and [place].      PMHX:   Past Medical History:  Diagnosis Date  . Asthma   . Diabetes mellitus    Surgical Hx:  Past Surgical History:  Procedure Laterality Date  . CHOLECYSTECTOMY  2000  . CORONARY STENT PLACEMENT  1999   Family Hx:  Family History  Problem Relation Age of Onset  . Asthma Brother    Social Hx:   Social History  Substance Use Topics  . Smoking status: Never Smoker  . Smokeless tobacco: Current User    Types: Chew  . Alcohol use Yes     Comment: socially last drink 2 weeks ago   Medication:    Reviewed.   Allergies:  Review of patient's allergies indicates no known allergies.  Review of Systems: Gen:  Denies  fever, sweats, chills HEENT: Denies blurred vision, double vision. bleeds, sore throat Cvc:  No dizziness, chest pain. Resp:   Denies current shortness of breath Gi: Denies swallowing difficulty, stomach pain. Gu:  Denies bladder incontinence, burning urine Ext:   No Joint pain, stiffness. Skin: No skin rash,  hives  Endoc:  No polyuria, polydipsia. Psych: No depression, insomnia. Other:  All other systems were reviewed with the patient and were negative other that what is mentioned in the HPI.  Physical Examination:   VS: BP 120/66   Pulse 95   Temp 98 F (36.7 C) (Oral)   Resp (!) 26   Ht 5\' 10"  (1.778 m)   Wt 170 lb (77.1 kg)   SpO2 90%   BMI 24.39 kg/m   General Appearance: No distress  Neuro:without focal findings,  speech normal,  HEENT: PERRLA, EOM intact.   Pulmonary: normal breath sounds, No wheezing. Scattered crackles.  CardiovascularNormal S1,S2.  No m/r/g.   Abdomen: Benign, Soft, non-tender. Renal:  No  costovertebral tenderness  GU:  No performed at this time. Endoc: No evident thyromegaly, no signs of acromegaly. Skin:   warm, no rashes, no ecchymosis  Extremities: normal, no cyanosis, clubbing.  Other findings:    LABORATORY PANEL:   CBC  Recent Labs Lab 06/12/16 0214  WBC 12.1*  HGB 15.1  HCT 44.5  PLT 179   ------------------------------------------------------------------------------------------------------------------  Chemistries   Recent Labs Lab 06/12/16 0214  NA 139  K 4.5  CL 102  CO2 28  GLUCOSE 198*  BUN 15  CREATININE 1.05  CALCIUM 9.2   ------------------------------------------------------------------------------------------------------------------  Cardiac Enzymes  Recent Labs Lab 06/12/16 0214  TROPONINI <0.03   ------------------------------------------------------------  RADIOLOGY:  Ct Angio Chest Pe W Or Wo Contrast  Result Date: 06/12/2016 CLINICAL DATA:  Difficulty breathing and history of bronchiectasis EXAM: CT ANGIOGRAPHY CHEST WITH CONTRAST TECHNIQUE: Multidetector CT imaging of the chest was performed using the standard protocol during bolus administration of intravenous contrast. Multiplanar CT image reconstructions and MIPs were obtained to evaluate the vascular anatomy. CONTRAST:  75 mL Isovue 370. COMPARISON:  Plain film from earlier in the same day, 04/20/2014 FINDINGS: Cardiovascular: Calcific changes of the thoracic aorta and its branches are seen. No aneurysmal dilatation or dissection is noted. Coronary calcifications are seen. The cardiac structures are otherwise within normal limits. The pulmonary artery as visualized shows a normal branching pattern. No definitive filling defect to suggest pulmonary embolism is noted. Mediastinum/Nodes: The thoracic inlet is within normal limits. No hilar or mediastinal adenopathy is seen. No axillary adenopathy is noted. Lungs/Pleura: Lungs are well aerated bilaterally. No focal confluent  infiltrate is seen. Mild dependent atelectatic changes are noted. No focal pulmonary nodules are seen. Upper Abdomen: Scattered hypodensities are noted within the liver most consistent with cysts. No other focal abnormality in the upper abdomen is noted. The gallbladder has been surgically removed. Musculoskeletal: Degenerative change of the thoracic spine is noted. No acute abnormality is seen. Review of the MIP images confirms the above findings. IMPRESSION: No evidence of pulmonary emboli. Minimal dependent atelectatic changes. Hypodensities within the liver likely representing cysts. These are stable from the prior exam. Electronically Signed   By: Alcide Clever M.D.   On: 06/12/2016 08:22   Dg Chest Portable 1 View  Result Date: 06/12/2016 CLINICAL DATA:  82 year old male with shortness of breath EXAM: PORTABLE CHEST 1 VIEW COMPARISON:  Chest radiograph dated 08/02/2015 FINDINGS: There is emphysematous changes of the lungs. No focal consolidation, pleural effusion, or pneumothorax. The cardiac silhouette is within normal limits. No acute osseous pathology. IMPRESSION: No acute cardiopulmonary process. Emphysema. Electronically Signed   By: Elgie Collard M.D.   On: 06/12/2016 02:58       Thank  you for the H[facility] and for allowing [facility] to assist in the care of your patient. Our recommendations are noted above.  Please contact us if we can be of further service.   Wells Guiles, MD.  Board  Certified in Internal Medicine, Pulmonary Medicine, Critical Care Medicine, and Sleep Medicine.  [facility] Office Number: [phone]  Santiago Gladavid Kasa, M.D.  Stephanie AcreVishal Mungal, M.D.  Billy Fischeravid Simonds, M.D  06/12/2016

## 2016-06-12 NOTE — ED Notes (Signed)
Pts O2 sats dropped to 88-90% on room air with HR up to 105-110's. Pt placed back on 2L O2 via [place]; O2 sats improved to 92% and HR down to 95 BPM. Dr Zenda AlpersWebster notified.

## 2016-06-12 NOTE — Care Management Note (Signed)
Case Management Note  Patient Details  Name: George Hensley MRN: [id number] Date of Birth: [birth year]  Subjective/Objective:     82yo Mr George Hensley was admitted from home with PNA. He resides at home with his wife. PCP=Dr Bethann Punches. Pharmacy=recently changed pharmacies and cannot remember the name of his current pharmacy. He has no home equipment, no home oxygen, and no home health services. He drives himself to appointments. No home health services anticipated after discharge. Case management will follow for discharge planning.                Action/Plan:   Expected Discharge Date:                  Expected Discharge Plan:     In-House Referral:     Discharge planning Services     Post Acute Care Choice:    Choice offered to:     DME Arranged:    DME Agency:     HH Arranged:    HH Agency:     Status of Service:     If discussed at [employer] of Stay Meetings, dates discussed:    Additional Comments:  George Hensley A, RN 06/12/2016, 3:15 PM

## 2016-06-12 NOTE — Progress Notes (Signed)
Chaplain making rounds visited Pt and had a great conversation with Pt about religion. Pt was in good spirit and requested prayers for healing so that he can go home soon. Ch provided prayers and spiritual support for the Pt.    06/12/16 1700  Clinical Encounter Type  Visited With Patient  Visit Type Initial  Referral From Nurse  Spiritual Encounters  Spiritual Needs Prayer

## 2016-06-12 NOTE — ED Triage Notes (Addendum)
Pt to triage via w/c with c/o increased SHOB tonight; denies pain; pt taken immed to room 5 and placed on card monitor and O2 at 2l/min via [place]

## 2016-06-12 NOTE — ED Notes (Signed)
Pt placed on room air to assess pt's ability to maintain O2 sats without supplemental oxygen.

## 2016-06-13 LAB — CBC
HCT: 36.3 % — ABNORMAL LOW (ref 40.0–52.0)
Hemoglobin: 12 g/dL — ABNORMAL LOW (ref 13.0–18.0)
MCH: 31.7 pg (ref 26.0–34.0)
MCHC: 33.2 g/dL (ref 32.0–36.0)
MCV: 95.6 fL (ref 80.0–100.0)
PLATELETS: 153 10*3/uL (ref 150–440)
RBC: 3.79 MIL/uL — ABNORMAL LOW (ref 4.40–5.90)
RDW: 13.8 % (ref 11.5–14.5)
WBC: 15.3 10*3/uL — AB (ref 3.8–10.6)

## 2016-06-13 LAB — GLUCOSE, CAPILLARY
GLUCOSE-CAPILLARY: 208 mg/dL — AB (ref 65–99)
GLUCOSE-CAPILLARY: 261 mg/dL — AB (ref 65–99)

## 2016-06-13 LAB — BASIC METABOLIC PANEL
Anion gap: 4 — ABNORMAL LOW (ref 5–15)
BUN: 29 mg/dL — AB (ref 6–20)
CO2: 25 mmol/L (ref 22–32)
CREATININE: 1.15 mg/dL (ref 0.61–1.24)
Calcium: 8.3 mg/dL — ABNORMAL LOW (ref 8.9–10.3)
Chloride: 107 mmol/L (ref 101–111)
GFR, EST NON AFRICAN AMERICAN: 57 mL/min — AB (ref >60)
Glucose, Bld: 212 mg/dL — ABNORMAL HIGH (ref 65–99)
Potassium: 4.6 mmol/L (ref 3.5–5.1)
SODIUM: 136 mmol/L (ref 135–145)

## 2016-06-13 LAB — HEMOGLOBIN A1C
Hgb A1c MFr Bld: 8.7 % — ABNORMAL HIGH (ref 4.8–5.6)
MEAN PLASMA GLUCOSE: 203 mg/dL

## 2016-06-13 MED ORDER — CEFEPIME-DEXTROSE 2 GM/50ML IV SOLR
2.0000 g | Freq: Two times a day (BID) | INTRAVENOUS | Status: DC
  Filled 2016-06-13: qty 50

## 2016-06-13 MED ORDER — LEVOFLOXACIN 750 MG PO TABS: 750.0000 mg | ORAL_TABLET | Freq: Every day | ORAL | 0 refills | Status: AC

## 2016-06-13 NOTE — Discharge Summary (Signed)
[facility] - [place] at [facility]   PATIENT NAME: George BlightJames Hensley    MR#:  [id number]  DATE OF BIRTH:  12-12-1930  DATE OF ADMISSION:  06/12/2016 ADMITTING PHYSICIAN: Adrian SaranSital Mody, MD  DATE OF DISCHARGE: 06/13/16  PRIMARY CARE PHYSICIAN: Danella PentonMark F Miller, MD    ADMISSION DIAGNOSIS:  SOB (shortness of breath) [R06.02] Bronchitis [J40] Hypoxia [R09.02]  DISCHARGE DIAGNOSIS:  Acute hypoxic respiratory failure with hypoxia Bronchitis Bronchiectasis SECONDARY DIAGNOSIS:   Past Medical History:  Diagnosis Date  . Asthma   . Diabetes mellitus     HOSPITAL COURSE:  George BlightJames Hensley  is a 82 y.o. male admitted 06/12/2016 with chief complaint Shortness of Breath . Please see H&P performed by Adrian SaranSital Mody, MD for further information. Patient presented with the above symptoms originally requiring oxygen to maintain oxygen saturations greater than 92%. However, he has made a quick turnaround time improved back to baseline respiratory status.  DISCHARGE CONDITIONS:   Stable  CONSULTS OBTAINED:    DRUG ALLERGIES:  No Known Allergies  DISCHARGE MEDICATIONS:  Continue all home medications added antibiotic for inflammation Current Discharge Medication List    START taking these medications   Details  levofloxacin (LEVAQUIN) 750 MG tablet Take 1 tablet (750 mg total) by mouth daily. Qty: 4 tablet, Refills: 0      CONTINUE these medications which have NOT CHANGED   Details  diltiazem (CARDIZEM LA) 120 MG 24 hr tablet Take 120 mg by mouth daily.    glimepiride (AMARYL) 4 MG tablet Take 4 mg by mouth 2 (two) times daily.    metFORMIN (GLUCOPHAGE) 1000 MG tablet Take 1,000 mg by mouth 2 (two) times daily.    Mometasone Furo-Formoterol Fum (DULERA) 200-5 MCG/ACT AERO Inhale 2 puffs into the lungs 2 (two) times daily. Qty: 1 Inhaler, Refills: 3    pravastatin (PRAVACHOL) 10 MG tablet Take 10 mg by mouth daily.    tiotropium (SPIRIVA) 18 MCG inhalation capsule  Place 18 mcg into inhaler and inhale daily.         DISCHARGE INSTRUCTIONS:    DIET:  Regular diet  DISCHARGE CONDITION:  Stable  ACTIVITY:  Activity as tolerated  OXYGEN:  Home Oxygen: No.   Oxygen Delivery: room air  DISCHARGE LOCATION:  home   If you experience worsening of your admission symptoms, develop shortness of breath, life threatening emergency, suicidal or homicidal thoughts you must seek medical attention immediately by calling 911 or calling your MD immediately  if symptoms less severe.  You Must read complete instructions/literature along with all the possible adverse reactions/side effects for all the Medicines you take and that have been prescribed to you. Take any new Medicines after you have completely understood and accpet all the possible adverse reactions/side effects.   Please note  You were cared for by a hospitalist during your hospital stay. If you have any questions about your discharge medications or the care you received while you were in the hospital after you are discharged, you can call the unit and asked to speak with the hospitalist on call if the hospitalist that took care of you is not available. Once you are discharged, your primary care physician will handle any further medical issues. Please note that NO REFILLS for any discharge medications will be authorized once you are discharged, as it is imperative that you return to your primary care physician (or establish a relationship with a primary care physician if you do not have one) for your aftercare  needs so that they can reassess your need for medications and monitor your lab values.    On the day of Discharge:   VITAL SIGNS:  Blood pressure (!) 113/58, pulse 86, temperature 98.1 F (36.7 C), temperature source Oral, resp. rate 18, height 5\' 10"  (1.778 m), weight 77.1 kg (170 lb), SpO2 93 %.  I/O:   Intake/Output Summary (Last 24 hours) at 06/13/16 1050 Last data filed at  06/13/16 0951  Gross per 24 hour  Intake           2272.5 ml  Output              500 ml  Net           1772.5 ml    PHYSICAL EXAMINATION:  GENERAL:  82 y.o.-year-old patient lying in the bed with no acute distress.  EYES: Pupils equal, round, reactive to light and accommodation. No scleral icterus. Extraocular muscles intact.  HEENT: Head atraumatic, normocephalic. Oropharynx and nasopharynx clear.  NECK:  Supple, no jugular venous distention. No thyroid enlargement, no tenderness.  LUNGS: Normal breath sounds bilaterally, no wheezing, rales,rhonchi or crepitation. No use of accessory muscles of respiration.  CARDIOVASCULAR: S1, S2 normal. No murmurs, rubs, or gallops.  ABDOMEN: Soft, non-tender, non-distended. Bowel sounds present. No organomegaly or mass.  EXTREMITIES: No pedal edema, cyanosis, or clubbing.  NEUROLOGIC: Cranial nerves II through XII are intact. Muscle strength 5/5 in all extremities. Sensation intact. Gait not checked.  PSYCHIATRIC: The patient is alert and oriented x 3.  SKIN: No obvious rash, lesion, or ulcer.   DATA REVIEW:   CBC  Recent Labs Lab 06/13/16 0508  WBC 15.3*  HGB 12.0*  HCT 36.3*  PLT 153    Chemistries   Recent Labs Lab 06/13/16 0508  NA 136  K 4.6  CL 107  CO2 25  GLUCOSE 212*  BUN 29*  CREATININE 1.15  CALCIUM 8.3*    Cardiac Enzymes  Recent Labs Lab 06/12/16 0214  TROPONINI <0.03    Microbiology Results  Results for orders placed or performed during the hospital encounter of 06/12/16  Blood culture (routine x 2)     Status: None (Preliminary result)   Collection Time: 06/12/16 10:08 AM  Result Value Ref Range Status   Specimen Description BLOOD RIGHT AC  Final   Special Requests   Final    BOTTLES DRAWN AEROBIC AND ANAEROBIC AER ANA   Culture NO GROWTH < 24 HOURS  Final   Report Status PENDING  Incomplete  Blood culture (routine x 2)     Status: None (Preliminary result)   Collection Time: 06/12/16  10:08 AM  Result Value Ref Range Status   Specimen Description BLOOD LEFT AC  Final   Special Requests   Final    BOTTLES DRAWN AEROBIC AND ANAEROBIC AER ANA   Culture NO GROWTH < 24 HOURS  Final   Report Status PENDING  Incomplete    RADIOLOGY:  Ct Angio Chest Pe W Or Wo Contrast  Result Date: 06/12/2016 CLINICAL DATA:  Difficulty breathing and history of bronchiectasis EXAM: CT ANGIOGRAPHY CHEST WITH CONTRAST TECHNIQUE: Multidetector CT imaging of the chest was performed using the standard protocol during bolus administration of intravenous contrast. Multiplanar CT image reconstructions and MIPs were obtained to evaluate the vascular anatomy. CONTRAST:  75 mL Isovue 370. COMPARISON:  Plain film from earlier in the same day, 04/20/2014 FINDINGS: Cardiovascular: Calcific changes of the thoracic aorta and its branches are  seen. No aneurysmal dilatation or dissection is noted. Coronary calcifications are seen. The cardiac structures are otherwise within normal limits. The pulmonary artery as visualized shows a normal branching pattern. No definitive filling defect to suggest pulmonary embolism is noted. Mediastinum/Nodes: The thoracic inlet is within normal limits. No hilar or mediastinal adenopathy is seen. No axillary adenopathy is noted. Lungs/Pleura: Lungs are well aerated bilaterally. No focal confluent infiltrate is seen. Mild dependent atelectatic changes are noted. No focal pulmonary nodules are seen. Upper Abdomen: Scattered hypodensities are noted within the liver most consistent with cysts. No other focal abnormality in the upper abdomen is noted. The gallbladder has been surgically removed. Musculoskeletal: Degenerative change of the thoracic spine is noted. No acute abnormality is seen. Review of the MIP images confirms the above findings. IMPRESSION: No evidence of pulmonary emboli. Minimal dependent atelectatic changes. Hypodensities within the liver likely representing cysts. These  are stable from the prior exam. Electronically Signed   By: Alcide Clever M.D.   On: 06/12/2016 08:22   Dg Chest Portable 1 View  Result Date: 06/12/2016 CLINICAL DATA:  82 year old male with shortness of breath EXAM: PORTABLE CHEST 1 VIEW COMPARISON:  Chest radiograph dated 08/02/2015 FINDINGS: There is emphysematous changes of the lungs. No focal consolidation, pleural effusion, or pneumothorax. The cardiac silhouette is within normal limits. No acute osseous pathology. IMPRESSION: No acute cardiopulmonary process. Emphysema. Electronically Signed   By: Elgie Collard M.D.   On: 06/12/2016 02:58     Management plans discussed with the patient, family and they are in agreement.  CODE STATUS:     Code Status Orders        Start     Ordered   06/12/16 0942  Full code  Continuous     06/12/16 0941    Code Status History    Date Active Date Inactive Code Status Order ID Comments User Context   08/02/2015  8:19 AM 08/03/2015  2:30 PM Full Code [id number]  Arnaldo Natal, MD Inpatient    Advance Directive Documentation   Flowsheet Row Most Recent Value  Type of Advance Directive  Healthcare Power of Attorney, Living will  Pre-existing out of facility DNR order (yellow form or pink MOST form)  No data  "MOST" Form in Place?  No data      TOTAL TIME TAKING CARE OF THIS PATIENT: 33 minutes.    Hower,  Mardi Mainland.D on 06/13/2016 at 10:50 AM  Between 7am to 6pm - Pager - [phone]  After 6pm go to [email or web address] - Social research officer, government  [facility]  Office  [phone]  CC: Primary care physician; Danella Penton, MD

## 2016-06-13 NOTE — Progress Notes (Signed)
Patient discharged home with wife. Prescription sent to [facility], but called to [facility] to [facility] (current [facility]). Patient aware, and does not have any questions. Eduction provided. Trudee KusterBrandi R Mansfield

## 2016-06-17 LAB — CULTURE, BLOOD (ROUTINE X 2)
Culture: NO GROWTH
Culture: NO GROWTH

## 2016-06-26 ENCOUNTER — Inpatient Hospital Stay
Admission: EM | Admit: 2016-06-26 | Discharge: 2016-06-27 | DRG: 189 | Disposition: A | Discharge status: Home / Self Care | Payer: Medicare Other | Attending: Internal Medicine | Admitting: Internal Medicine

## 2016-06-26 ENCOUNTER — Encounter: Payer: Self-pay | Admitting: Emergency Medicine

## 2016-06-26 ENCOUNTER — Emergency Department: Payer: Medicare Other

## 2016-06-26 DIAGNOSIS — E1165 Type 2 diabetes mellitus with hyperglycemia: Secondary | ICD-10-CM | POA: Diagnosis present

## 2016-06-26 DIAGNOSIS — R627 Adult failure to thrive: Secondary | ICD-10-CM | POA: Diagnosis present

## 2016-06-26 DIAGNOSIS — Z7984 Long term (current) use of oral hypoglycemic drugs: Secondary | ICD-10-CM

## 2016-06-26 DIAGNOSIS — J209 Acute bronchitis, unspecified: Secondary | ICD-10-CM | POA: Diagnosis present

## 2016-06-26 DIAGNOSIS — Z825 Family history of asthma and other chronic lower respiratory diseases: Secondary | ICD-10-CM

## 2016-06-26 DIAGNOSIS — J470 Bronchiectasis with acute lower respiratory infection: Secondary | ICD-10-CM | POA: Diagnosis present

## 2016-06-26 DIAGNOSIS — Z955 Presence of coronary angioplasty implant and graft: Secondary | ICD-10-CM

## 2016-06-26 DIAGNOSIS — Z79899 Other long term (current) drug therapy: Secondary | ICD-10-CM | POA: Diagnosis not present

## 2016-06-26 DIAGNOSIS — J9601 Acute respiratory failure with hypoxia: Principal | ICD-10-CM | POA: Diagnosis present

## 2016-06-26 DIAGNOSIS — Z720 Tobacco use: Secondary | ICD-10-CM | POA: Diagnosis not present

## 2016-06-26 DIAGNOSIS — Z7982 Long term (current) use of aspirin: Secondary | ICD-10-CM | POA: Diagnosis not present

## 2016-06-26 DIAGNOSIS — J45901 Unspecified asthma with (acute) exacerbation: Secondary | ICD-10-CM | POA: Diagnosis present

## 2016-06-26 DIAGNOSIS — R0600 Dyspnea, unspecified: Secondary | ICD-10-CM | POA: Diagnosis present

## 2016-06-26 HISTORY — DX: Bronchiectasis, uncomplicated: J47.9

## 2016-06-26 LAB — COMPREHENSIVE METABOLIC PANEL
ALK PHOS: 49 U/L (ref 38–126)
ALT: 25 U/L (ref 17–63)
AST: 22 U/L (ref 15–41)
Albumin: 4 g/dL (ref 3.5–5.0)
Anion gap: 6 (ref 5–15)
BUN: 11 mg/dL (ref 6–20)
CALCIUM: 8.7 mg/dL — AB (ref 8.9–10.3)
CHLORIDE: 105 mmol/L (ref 101–111)
CO2: 29 mmol/L (ref 22–32)
CREATININE: 0.93 mg/dL (ref 0.61–1.24)
GFR calc Af Amer: >60 mL/min (ref >60)
Glucose, Bld: 201 mg/dL — ABNORMAL HIGH (ref 65–99)
Potassium: 3.9 mmol/L (ref 3.5–5.1)
Sodium: 140 mmol/L (ref 135–145)
Total Bilirubin: 1 mg/dL (ref 0.3–1.2)
Total Protein: 7.1 g/dL (ref 6.5–8.1)

## 2016-06-26 LAB — GLUCOSE, CAPILLARY
GLUCOSE-CAPILLARY: 280 mg/dL — AB (ref 65–99)
GLUCOSE-CAPILLARY: 305 mg/dL — AB (ref 65–99)
GLUCOSE-CAPILLARY: 290 mg/dL — AB (ref 65–99)
GLUCOSE-CAPILLARY: 373 mg/dL — AB (ref 65–99)
Glucose-Capillary: 356 mg/dL — ABNORMAL HIGH (ref 65–99)

## 2016-06-26 LAB — MAGNESIUM: Magnesium: 1.8 mg/dL (ref 1.7–2.4)

## 2016-06-26 LAB — TROPONIN I

## 2016-06-26 LAB — CBC WITH DIFFERENTIAL/PLATELET
BASOS ABS: 0.2 10*3/uL — AB (ref 0–0.1)
Basophils Relative: 1 %
EOS PCT: 7 %
Eosinophils Absolute: 0.9 10*3/uL — ABNORMAL HIGH (ref 0–0.7)
HEMATOCRIT: 46.6 % (ref 40.0–52.0)
HEMOGLOBIN: 15.7 g/dL (ref 13.0–18.0)
LYMPHS ABS: 2.6 10*3/uL (ref 1.0–3.6)
LYMPHS PCT: 21 %
MCH: 31.7 pg (ref 26.0–34.0)
MCHC: 33.7 g/dL (ref 32.0–36.0)
MCV: 94.1 fL (ref 80.0–100.0)
Monocytes Absolute: 1.1 10*3/uL — ABNORMAL HIGH (ref 0.2–1.0)
Monocytes Relative: 8 %
NEUTROS ABS: 8.1 10*3/uL — AB (ref 1.4–6.5)
NEUTROS PCT: 63 %
PLATELETS: 174 10*3/uL (ref 150–440)
RBC: 4.95 MIL/uL (ref 4.40–5.90)
RDW: 13.8 % (ref 11.5–14.5)
WBC: 12.8 10*3/uL — AB (ref 3.8–10.6)

## 2016-06-26 MED ORDER — IPRATROPIUM-ALBUTEROL 0.5-2.5 (3) MG/3ML IN SOLN
3.0000 mL | Freq: Four times a day (QID) | RESPIRATORY_TRACT | Status: DC
  Filled 2016-06-26: qty 3

## 2016-06-26 MED ORDER — DILTIAZEM HCL ER COATED BEADS 120 MG PO CP24
120.0000 mg | ORAL_CAPSULE | Freq: Every day | ORAL | Status: DC
  Administered 2016-06-26 – 2016-06-27 (×2): 120 mg via ORAL
  Filled 2016-06-26 (×4): qty 1

## 2016-06-26 MED ORDER — ONDANSETRON HCL 4 MG PO TABS: 4.0000 mg | ORAL_TABLET | Freq: Four times a day (QID) | ORAL | Status: DC | PRN

## 2016-06-26 MED ORDER — ACETAMINOPHEN 325 MG PO TABS
650.0000 mg | ORAL_TABLET | Freq: Four times a day (QID) | ORAL | Status: DC | PRN
Start: 2016-06-26 — End: 2016-06-27

## 2016-06-26 MED ORDER — PRAVASTATIN SODIUM 10 MG PO TABS
10.0000 mg | ORAL_TABLET | Freq: Every day | ORAL | Status: DC
  Administered 2016-06-26: 17:00:00 10 mg via ORAL
  Filled 2016-06-26: qty 1

## 2016-06-26 MED ORDER — INSULIN ASPART 100 UNIT/ML ~~LOC~~ SOLN
0.0000 [IU] | Freq: Every day | SUBCUTANEOUS | Status: DC
  Administered 2016-06-26: 23:00:00 5 [IU] via SUBCUTANEOUS
  Filled 2016-06-26: qty 5

## 2016-06-26 MED ORDER — ENOXAPARIN SODIUM 40 MG/0.4ML ~~LOC~~ SOLN
40.0000 mg | SUBCUTANEOUS | Status: DC
  Administered 2016-06-26: 40 mg via SUBCUTANEOUS
  Filled 2016-06-26 (×2): qty 0.4

## 2016-06-26 MED ORDER — CEFEPIME-DEXTROSE 2 GM/50ML IV SOLR
2.0000 g | Freq: Two times a day (BID) | INTRAVENOUS | Status: DC
  Administered 2016-06-26: 23:00:00 2 g via INTRAVENOUS
  Filled 2016-06-26 (×2): qty 50

## 2016-06-26 MED ORDER — GLIMEPIRIDE 2 MG PO TABS
4.0000 mg | ORAL_TABLET | Freq: Two times a day (BID) | ORAL | Status: DC
  Administered 2016-06-26 – 2016-06-27 (×3): 4 mg via ORAL
  Filled 2016-06-26 (×3): qty 2

## 2016-06-26 MED ORDER — METHYLPREDNISOLONE SODIUM SUCC 125 MG IJ SOLR
60.0000 mg | Freq: Once | INTRAMUSCULAR | Status: AC
  Administered 2016-06-26: 60 mg via INTRAVENOUS
  Filled 2016-06-26: qty 2

## 2016-06-26 MED ORDER — SODIUM CHLORIDE 0.9% FLUSH: 3.0000 mL | INTRAVENOUS | Status: DC | PRN

## 2016-06-26 MED ORDER — VITAMIN D 1000 UNITS PO TABS
1000.0000 [IU] | ORAL_TABLET | ORAL | Status: DC
  Administered 2016-06-27: 1000 [IU] via ORAL
  Filled 2016-06-26: qty 1

## 2016-06-26 MED ORDER — MAGNESIUM SULFATE 2 GM/50ML IV SOLN
2.0000 g | Freq: Once | INTRAVENOUS | Status: AC
  Administered 2016-06-26: 2 g via INTRAVENOUS
  Filled 2016-06-26: qty 50

## 2016-06-26 MED ORDER — METHYLPREDNISOLONE SODIUM SUCC 125 MG IJ SOLR
125.0000 mg | Freq: Four times a day (QID) | INTRAMUSCULAR | Status: DC
  Administered 2016-06-26: 09:00:00 125 mg via INTRAVENOUS
  Filled 2016-06-26: qty 2

## 2016-06-26 MED ORDER — METFORMIN HCL 500 MG PO TABS
1000.0000 mg | ORAL_TABLET | Freq: Two times a day (BID) | ORAL | Status: DC
  Administered 2016-06-26 – 2016-06-27 (×3): 1000 mg via ORAL
  Filled 2016-06-26 (×3): qty 2

## 2016-06-26 MED ORDER — IPRATROPIUM-ALBUTEROL 0.5-2.5 (3) MG/3ML IN SOLN
3.0000 mL | Freq: Four times a day (QID) | RESPIRATORY_TRACT | Status: DC
  Administered 2016-06-26 – 2016-06-27 (×4): 3 mL via RESPIRATORY_TRACT
  Filled 2016-06-26 (×4): qty 3

## 2016-06-26 MED ORDER — SODIUM CHLORIDE 0.9 % IV SOLN: 250.0000 mL | INTRAVENOUS | Status: DC | PRN

## 2016-06-26 MED ORDER — ALBUTEROL SULFATE (2.5 MG/3ML) 0.083% IN NEBU: 2.5000 mg | INHALATION_SOLUTION | RESPIRATORY_TRACT | Status: DC | PRN

## 2016-06-26 MED ORDER — DOCUSATE SODIUM 100 MG PO CAPS
100.0000 mg | ORAL_CAPSULE | Freq: Two times a day (BID) | ORAL | Status: DC
  Administered 2016-06-26 – 2016-06-27 (×3): 100 mg via ORAL
  Filled 2016-06-26 (×3): qty 1

## 2016-06-26 MED ORDER — LINAGLIPTIN 5 MG PO TABS
5.0000 mg | ORAL_TABLET | Freq: Every day | ORAL | Status: DC
  Administered 2016-06-26 – 2016-06-27 (×2): 5 mg via ORAL
  Filled 2016-06-26 (×2): qty 1

## 2016-06-26 MED ORDER — INSULIN ASPART 100 UNIT/ML ~~LOC~~ SOLN
0.0000 [IU] | Freq: Three times a day (TID) | SUBCUTANEOUS | Status: DC
  Administered 2016-06-26: 11 [IU] via SUBCUTANEOUS
  Administered 2016-06-26: 13:00:00 15 [IU] via SUBCUTANEOUS
  Administered 2016-06-26: 11 [IU] via SUBCUTANEOUS
  Administered 2016-06-27: 15 [IU] via SUBCUTANEOUS
  Administered 2016-06-27: 20 [IU] via SUBCUTANEOUS
  Filled 2016-06-26: qty 15
  Filled 2016-06-26: qty 20
  Filled 2016-06-26 (×2): qty 11
  Filled 2016-06-26: qty 15

## 2016-06-26 MED ORDER — MOMETASONE FURO-FORMOTEROL FUM 200-5 MCG/ACT IN AERO
2.0000 | INHALATION_SPRAY | Freq: Two times a day (BID) | RESPIRATORY_TRACT | Status: DC
  Administered 2016-06-26 – 2016-06-27 (×2): 2 via RESPIRATORY_TRACT
  Filled 2016-06-26: qty 8.8

## 2016-06-26 MED ORDER — METHYLPREDNISOLONE SODIUM SUCC 125 MG IJ SOLR
60.0000 mg | Freq: Two times a day (BID) | INTRAMUSCULAR | Status: DC
  Administered 2016-06-26 – 2016-06-27 (×2): 60 mg via INTRAVENOUS
  Filled 2016-06-26 (×2): qty 2

## 2016-06-26 MED ORDER — IPRATROPIUM-ALBUTEROL 0.5-2.5 (3) MG/3ML IN SOLN
3.0000 mL | Freq: Once | RESPIRATORY_TRACT | Status: AC
  Administered 2016-06-26: 3 mL via RESPIRATORY_TRACT
  Filled 2016-06-26: qty 3

## 2016-06-26 MED ORDER — CEFEPIME-DEXTROSE 2 GM/50ML IV SOLR
2.0000 g | Freq: Three times a day (TID) | INTRAVENOUS | Status: DC
  Administered 2016-06-26: 09:00:00 2 g via INTRAVENOUS
  Filled 2016-06-26 (×3): qty 50

## 2016-06-26 MED ORDER — ASPIRIN EC 81 MG PO TBEC
81.0000 mg | DELAYED_RELEASE_TABLET | Freq: Every day | ORAL | Status: DC
  Administered 2016-06-26 – 2016-06-27 (×2): 81 mg via ORAL
  Filled 2016-06-26 (×2): qty 1

## 2016-06-26 MED ORDER — SODIUM CHLORIDE 0.9% FLUSH
3.0000 mL | Freq: Two times a day (BID) | INTRAVENOUS | Status: DC
  Administered 2016-06-26 – 2016-06-27 (×3): 3 mL via INTRAVENOUS

## 2016-06-26 MED ORDER — CIPROFLOXACIN HCL 500 MG PO TABS
500.0000 mg | ORAL_TABLET | Freq: Two times a day (BID) | ORAL | Status: DC
  Administered 2016-06-26 – 2016-06-27 (×3): 500 mg via ORAL
  Filled 2016-06-26 (×3): qty 1

## 2016-06-26 MED ORDER — ONDANSETRON HCL 4 MG/2ML IJ SOLN: 4.0000 mg | Freq: Four times a day (QID) | INTRAMUSCULAR | Status: DC | PRN

## 2016-06-26 MED ORDER — ACETAMINOPHEN 650 MG RE SUPP: 650.0000 mg | Freq: Four times a day (QID) | RECTAL | Status: DC | PRN

## 2016-06-26 NOTE — Care Management Important Message (Signed)
Important Message  Patient Details  Name: George Hensley MRN: [id number] Date of Birth: Jan 07, 1931   Medicare Important Message Given:  Yes    [name], RN 06/26/2016, 8:22 AM

## 2016-06-26 NOTE — Progress Notes (Signed)
While rounding, CH made initial visit to room 106. Pt was alert and in a good mood. We had a great conversation about sports and made a good connection. Pt requested prayer for healing in his lungs which was provided. CH is available for follow up as needed.    06/26/16 1400  Clinical Encounter Type  Visited With Patient  Visit Type Initial;Spiritual support  Referral From Nurse  Spiritual Encounters  Spiritual Needs Prayer

## 2016-06-26 NOTE — Care Management (Signed)
Admitted to [facility] with the diagnosis of respiratory distress. Discharged from this facility about 2 week ago. Lives with wife, George Hensley [phone]). Seen Dr. Bethann PunchesMark Miller last week.No skilled facility. No home health. No home oxygen. Takes care of all basic and instrumental activities of daily living himself, drives. Prescriptions are filled at [facility]. No falls. Good appetite.  Gwenette GreetBrenda S Altagracia Rone RN MSN CCM Care Management [phone]

## 2016-06-26 NOTE — Progress Notes (Signed)
Pharmacy Antibiotic Note  George ParisJames F Hensley is a 82 y.o. male admitted on 06/26/2016 with COPD exacerbation.  Pharmacy has been consulted for cefepime dosing.  Plan: Cefepime 2 grams q 12 hours ordered. Per MD patient has history of Pseudomonas infection.  Height: 5\' 10"  (177.8 cm) Weight: 170 lb (77.1 kg) IBW/kg (Calculated) : 73  Temp (24hrs), Avg:97.6 F (36.4 C), Min:97.3 F (36.3 C), Max:97.8 F (36.6 C)   Recent Labs Lab 06/26/16 0400  WBC 12.8*  CREATININE 0.93    Estimated Creatinine Clearance: 62.1 mL/min (by C-G formula based on SCr of 0.93 mg/dL).    No Known Allergies  Antimicrobials this admission: cefepime  >>    >>   Dose adjustments this admission:   Microbiology results: No micro this admission   10/31 CXR: no acute disease  Thank you for allowing pharmacy to be a part of this patient's care.  Theora Vankirk M Orlie Cundari 06/26/2016 11:24 AM

## 2016-06-26 NOTE — H&P (Signed)
[facility] - [place] at [facility]   PATIENT NAME: George BlightJames Hensley    MR#:  [id number]  DATE OF BIRTH:  [birth year]  DATE OF ADMISSION:  06/26/2016  PRIMARY CARE PHYSICIAN: Danella PentonMark F Miller, MD   REQUESTING/REFERRING PHYSICIAN:   CHIEF COMPLAINT:   Chief Complaint  Patient presents with  . Shortness of Breath    HISTORY OF PRESENT ILLNESS: George BlightJames Hensley  is a 82 y.o. male with a known history of Bronchiectasis, bronchial asthma, diabetes mellitus presented to the emergency room with increased shortness of breath for the last few days. Patient has cough productive of yellowish phlegm, no history of any fever or chills. Patient has been short of breath and does not use any home oxygen. No complaints of any chest pain. Patient was treated in the past at our hospital for pneumonia and he follows up [facility] pulmonary division [facility]. Patient was diagnosed with bronchiectasis 4 years ago. Has generalized weakness. Patient was given nebulization treatments in the emergency room and stabilized. Patient had Pseudomonas infection in the past for which she received broad-spectrum antibiotics.  PAST MEDICAL HISTORY:   Past Medical History:  Diagnosis Date  . Asthma   . Bronchiectasis (HCC)   . Diabetes mellitus     PAST SURGICAL HISTORY: Past Surgical History:  Procedure Laterality Date  . CHOLECYSTECTOMY  2000  . CORONARY STENT PLACEMENT  1999    SOCIAL HISTORY:  Social History  Substance Use Topics  . Smoking status: Never Smoker  . Smokeless tobacco: Current User    Types: Chew  . Alcohol use Yes     Comment: socially last drink 2 weeks ago    FAMILY HISTORY:  Family History  Problem Relation Age of Onset  . Asthma Brother     DRUG ALLERGIES: No Known Allergies  REVIEW OF SYSTEMS:   CONSTITUTIONAL: Low grade fever, has weakness.  EYES: No blurred or double vision.  EARS, NOSE, AND THROAT: No tinnitus or ear pain.  RESPIRATORY: Has  cough, shortness of breath, wheezing  No hemoptysis.  CARDIOVASCULAR: No chest pain, orthopnea, edema.  GASTROINTESTINAL: No nausea, vomiting, diarrhea or abdominal pain.  GENITOURINARY: No dysuria, hematuria.  ENDOCRINE: No polyuria, nocturia,  HEMATOLOGY: No anemia, easy bruising or bleeding SKIN: No rash or lesion. MUSCULOSKELETAL: No joint pain or arthritis.   NEUROLOGIC: No tingling, numbness, weakness.  PSYCHIATRY: No anxiety or depression.   MEDICATIONS AT HOME:  Prior to Admission medications   Medication Sig Start Date End Date Taking? Authorizing Provider  albuterol (PROVENTIL) (2.5 MG/3ML) 0.083% nebulizer solution Take 2.5 mg by nebulization 2 (two) times daily. 11/15/15 11/14/16 Yes Historical Provider, MD  aspirin EC 81 MG tablet Take 81 mg by mouth daily.  09/22/12  Yes Historical Provider, MD  diltiazem (CARDIZEM LA) 120 MG 24 hr tablet Take 120 mg by mouth daily.   Yes Historical Provider, MD  glimepiride (AMARYL) 4 MG tablet Take 4 mg by mouth 2 (two) times daily.   Yes Historical Provider, MD  pravastatin (PRAVACHOL) 10 MG tablet Take 10 mg by mouth daily.   Yes Historical Provider, MD  tiotropium (SPIRIVA) 18 MCG inhalation capsule Place 18 mcg into inhaler and inhale daily.   Yes Historical Provider, MD  VENTOLIN HFA 108 (90 Base) MCG/ACT inhaler Inhale 1 puff into the lungs every 6 (six) hours as needed. 06/25/16  Yes Historical Provider, MD  Cholecalciferol (VITAMIN D-1000 MAX ST) 1000 units tablet Take 1 tablet by mouth 3 (  three) times a week.    Historical Provider, MD  metFORMIN (GLUCOPHAGE) 1000 MG tablet Take 1,000 mg by mouth 2 (two) times daily.    Historical Provider, MD  Mometasone Furo-Formoterol Fum (DULERA) 200-5 MCG/ACT AERO Inhale 2 puffs into the lungs 2 (two) times daily. 07/02/11   Nyoka CowdenMichael B Wert, MD  sitaGLIPtin (JANUVIA) 100 MG tablet Take 1 tablet by mouth daily.    Historical Provider, MD      PHYSICAL EXAMINATION:   VITAL SIGNS: Blood pressure  118/77, pulse 85, temperature 97.3 F (36.3 C), temperature source Oral, resp. rate (!) 23, height 5\' 10"  (1.778 m), weight 77.1 kg (170 lb), SpO2 98 %.  GENERAL:  82 y.o.-year-old patient lying in the bed with no acute distress.  EYES: Pupils equal, round, reactive to light and accommodation. No scleral icterus. Extraocular muscles intact.  HEENT: Head atraumatic, normocephalic. Oropharynx and nasopharynx clear.  NECK:  Supple, no jugular venous distention. No thyroid enlargement, no tenderness.  LUNGS: Decreased breath sounds bilaterally, scattered rales heard bilaterally. No use of accessory muscles of respiration.  CARDIOVASCULAR: S1, S2 normal. No murmurs, rubs, or gallops.  ABDOMEN: Soft, nontender, nondistended. Bowel sounds present. No organomegaly or mass.  EXTREMITIES: No pedal edema, cyanosis, or clubbing.  NEUROLOGIC: Cranial nerves II through XII are intact. Muscle strength 5/5 in all extremities. Sensation intact. Gait not checked.  PSYCHIATRIC: The patient is alert and oriented x 3.  SKIN: No obvious rash, lesion, or ulcer.   LABORATORY PANEL:   CBC  Recent Labs Lab 06/26/16 0400  WBC 12.8*  HGB 15.7  HCT 46.6  PLT 174  MCV 94.1  MCH 31.7  MCHC 33.7  RDW 13.8  LYMPHSABS 2.6  MONOABS 1.1*  EOSABS 0.9*  BASOSABS 0.2*   ------------------------------------------------------------------------------------------------------------------  Chemistries   Recent Labs Lab 06/26/16 0222 06/26/16 0400  NA  --  140  K  --  3.9  CL  --  105  CO2  --  29  GLUCOSE  --  201*  BUN  --  11  CREATININE  --  0.93  CALCIUM  --  8.7*  MG 1.8  --   AST  --  22  ALT  --  25  ALKPHOS  --  49  BILITOT  --  1.0   ------------------------------------------------------------------------------------------------------------------ estimated creatinine clearance is 62.1 mL/min (by C-G formula based on SCr of 0.93  mg/dL). ------------------------------------------------------------------------------------------------------------------ No results for input(s): TSH, T4TOTAL, T3FREE, THYROIDAB in the last 72 hours.  Invalid input(s): FREET3   Coagulation profile No results for input(s): INR, PROTIME in the last 168 hours. ------------------------------------------------------------------------------------------------------------------- No results for input(s): DDIMER in the last 72 hours. -------------------------------------------------------------------------------------------------------------------  Cardiac Enzymes  Recent Labs Lab 06/26/16 0222  TROPONINI <0.03   ------------------------------------------------------------------------------------------------------------------ Invalid input(s): POCBNP  ---------------------------------------------------------------------------------------------------------------  Urinalysis    Component Value Date/Time   COLORURINE Yellow 05/16/2014 0851   APPEARANCEUR Cloudy 05/16/2014 0851   LABSPEC 1.020 05/16/2014 0851   PHURINE 5.0 05/16/2014 0851   GLUCOSEU 150 mg/dL 16/10/960409/20/2015 54090851   HGBUR 1+ 05/16/2014 0851   BILIRUBINUR Negative 05/16/2014 0851   KETONESUR 1+ 05/16/2014 0851   PROTEINUR 30 mg/dL 81/19/147809/20/2015 29560851   NITRITE Positive 05/16/2014 0851   LEUKOCYTESUR 3+ 05/16/2014 0851     RADIOLOGY: Dg Chest Portable 1 View  Result Date: 06/26/2016 CLINICAL DATA:  82 y/o M; cough and wheezing with shortness of breath. EXAM: PORTABLE CHEST 1 VIEW COMPARISON:  06/12/2016 chest radiograph. FINDINGS: Stable cardiac silhouette given projection and technique. Clear  lungs. No pneumothorax or effusion. No acute osseous abnormality is evident. IMPRESSION: No acute cardiopulmonary process. Electronically Signed   By: Mitzi Hansen M.D.   On: 06/26/2016 02:50    EKG: Orders placed or performed during the hospital encounter of 06/26/16  .  ED EKG  . ED EKG  . ED EKG  . ED EKG    IMPRESSION AND PLAN: 82 year old male patient with history of bronchiectasis, diabetes mellitus, bronchial asthma resented to the emergency room with difficulty breathing and cough productive of phlegm. Admitting diagnosis 1. Acute bronchial last exacerbation 2. Bronchiectasis 3. Diabetes mellitus 4. Failure to thrive Treatment plan Admit patient to medical floor Start patient on IV Solu-Medrol 125 MG every 6 hourly and nebulization treatment Start patient on IV cefepime antibiotic to cover for any pseudomonas infection empirically Patient had pseudomonas infection in the past. Medical management of diabetes mellitus Discussed with pulmonary attending at [facility], patient follows with them in the community Supportive care.   All the records are reviewed and case discussed with ED provider. Management plans discussed with the patient, family and they are in agreement.  CODE STATUS:FULL Code Status History    Date Active Date Inactive Code Status Order ID Comments User Context   06/12/2016  9:41 AM 06/13/2016  3:07 PM Full Code [id number]  Adrian Saran, MD Inpatient   08/02/2015  8:19 AM 08/03/2015  2:30 PM Full Code [id number]  Arnaldo Natal, MD Inpatient    Advance Directive Documentation   Flowsheet Row Most Recent Value  Type of Advance Directive  Healthcare Power of Attorney  Pre-existing out of facility DNR order (yellow form or pink MOST form)  No data  "MOST" Form in Place?  No data       TOTAL TIME TAKING CARE OF THIS PATIENT: 53 minutes.    Ihor Austin M.D on 06/26/2016 at 5:22 AM  Between 7am to 6pm - Pager - [phone]  After 6pm go to [email or web address] - password [email or web address] [facility]  [facility]  Office  [phone]  CC: Primary care physician; Danella Penton, MD

## 2016-06-26 NOTE — ED Provider Notes (Signed)
[facility] Emergency Department Provider Note    First MD Initiated Contact with Patient 06/26/16 0214     (approximate)  I have reviewed the triage vital signs and the nursing notes.   HISTORY  Chief Complaint Shortness of Breath  Level V Caveat:  Acute respiratory distress  HPI George Hensley is a 82 y.o. male  history of bronchiectasis and asthma presents with acute onset shortness of breath associated with productive yellow cough. Patient denies any previous history of congestive heart failure but was recently admitted to the hospital for some of presentation roughly 2 weeks ago. He has been on antibiotics since then. He does not wear home oxygen. States that he awoke tonight with worsening shortness of breath.  During previous admission CTA of the chest showed no evidence of pulmonary embolism. He is managed with antibiotics and nebulizer treatments with improvement in symptoms and he was discharged home.   Past Medical History:  Diagnosis Date  . Asthma   . Bronchiectasis (HCC)   . Diabetes mellitus     Patient Active Problem List   Diagnosis Date Noted  . Dyspnea 06/26/2016  . Asthma exacerbation 06/26/2016  . Hypoxia 06/12/2016  . Acute on chronic respiratory failure with hypoxia (HCC) 08/02/2015  . Asthma 04/04/2011  . Pulmonary nodule 03/06/2011    Past Surgical History:  Procedure Laterality Date  . CHOLECYSTECTOMY  2000  . CORONARY STENT PLACEMENT  1999    Prior to Admission medications   Medication Sig Start Date End Date Taking? Authorizing Provider  albuterol (PROVENTIL) (2.5 MG/3ML) 0.083% nebulizer solution Take 2.5 mg by nebulization 2 (two) times daily. 11/15/15 11/14/16 Yes Historical Provider, MD  aspirin EC 81 MG tablet Take 81 mg by mouth daily.  09/22/12  Yes Historical Provider, MD  diltiazem (CARDIZEM LA) 120 MG 24 hr tablet Take 120 mg by mouth daily.   Yes Historical Provider, MD  glimepiride (AMARYL) 4 MG tablet  Take 4 mg by mouth 2 (two) times daily.   Yes Historical Provider, MD  pravastatin (PRAVACHOL) 10 MG tablet Take 10 mg by mouth daily.   Yes Historical Provider, MD  tiotropium (SPIRIVA) 18 MCG inhalation capsule Place 18 mcg into inhaler and inhale daily.   Yes Historical Provider, MD  VENTOLIN HFA 108 (90 Base) MCG/ACT inhaler Inhale 1 puff into the lungs every 6 (six) hours as needed. 06/25/16  Yes Historical Provider, MD  Cholecalciferol (VITAMIN D-1000 MAX ST) 1000 units tablet Take 1 tablet by mouth 3 (three) times a week.    Historical Provider, MD  metFORMIN (GLUCOPHAGE) 1000 MG tablet Take 1,000 mg by mouth 2 (two) times daily.    Historical Provider, MD  Mometasone Furo-Formoterol Fum (DULERA) 200-5 MCG/ACT AERO Inhale 2 puffs into the lungs 2 (two) times daily. 07/02/11   Nyoka CowdenMichael B Wert, MD  sitaGLIPtin (JANUVIA) 100 MG tablet Take 1 tablet by mouth daily.    Historical Provider, MD    Allergies Review of patient's allergies indicates no known allergies.  Family History  Problem Relation Age of Onset  . Asthma Brother     Social History Social History  Substance Use Topics  . Smoking status: Never Smoker  . Smokeless tobacco: Current User    Types: Chew  . Alcohol use Yes     Comment: socially last drink 2 weeks ago    Review of Systems Patient denies headaches, rhinorrhea, blurry vision, numbness, shortness of breath, chest pain, edema, cough, abdominal pain, nausea, vomiting,  diarrhea, dysuria, fevers, rashes or hallucinations unless otherwise stated above in HPI. ____________________________________________   PHYSICAL EXAM:  VITAL SIGNS: Vitals:   06/26/16 0330 06/26/16 0400  BP: 133/72 118/77  Pulse: 88 85  Resp: (!) 22 (!) 23  Temp:      Constitutional: Alert and oriented. Patient to Neck and arrives in acute respiratory distress Eyes: Conjunctivae are normal. PERRL. EOMI. Head: Atraumatic. Nose: No congestion/rhinnorhea. Mouth/Throat: Mucous membranes  are moist.  Oropharynx non-erythematous. Neck: No stridor. Painless ROM. No cervical spine tenderness to palpation Hematological/Lymphatic/Immunilogical: No cervical lymphadenopathy. Cardiovascular: Tachycardic, regular rhythm. Grossly normal heart sounds.  Good peripheral circulation. Respiratory: Patient to Neck, tripoding with use of accessory muscles. Diffuse wheezing with prolonged expiratory phase. Breath sounds are equal bilaterally. Gastrointestinal: Soft and nontender. No distention. No abdominal bruits. No CVA tenderness. Genitourinary:  Musculoskeletal: No lower extremity tenderness nor edema.  No joint effusions. Neurologic:  Normal speech and language. No gross focal neurologic deficits are appreciated. No gait instability. Skin:  Skin is warm, dry and intact. No rash noted. Psychiatric: Mood and affect are normal. Speech and behavior are normal.  ____________________________________________   LABS (all labs ordered are listed, but only abnormal results are displayed)  Results for orders placed or performed during the hospital encounter of 06/26/16 (from the past 24 hour(s))  Magnesium     Status: None   Collection Time: 06/26/16  2:22 AM  Result Value Ref Range   Magnesium 1.8 1.7 - 2.4 mg/dL  Troponin I     Status: None   Collection Time: 06/26/16  2:22 AM  Result Value Ref Range   Troponin I <0.03 <0.03 ng/mL  CBC with Differential/Platelet     Status: Abnormal   Collection Time: 06/26/16  4:00 AM  Result Value Ref Range   WBC 12.8 (H) 3.8 - 10.6 K/uL   RBC 4.95 4.40 - 5.90 MIL/uL   Hemoglobin 15.7 13.0 - 18.0 g/dL   HCT 78.2 95.6 - 21.3 %   MCV 94.1 80.0 - 100.0 fL   MCH 31.7 26.0 - 34.0 pg   MCHC 33.7 32.0 - 36.0 g/dL   RDW 08.6 57.8 - 46.9 %   Platelets 174 150 - 440 K/uL   Neutrophils Relative % 63 %   Neutro Abs 8.1 (H) 1.4 - 6.5 K/uL   Lymphocytes Relative 21 %   Lymphs Abs 2.6 1.0 - 3.6 K/uL   Monocytes Relative 8 %   Monocytes Absolute 1.1 (H) 0.2 -  1.0 K/uL   Eosinophils Relative 7 %   Eosinophils Absolute 0.9 (H) 0 - 0.7 K/uL   Basophils Relative 1 %   Basophils Absolute 0.2 (H) 0 - 0.1 K/uL  Comprehensive metabolic panel     Status: Abnormal   Collection Time: 06/26/16  4:00 AM  Result Value Ref Range   Sodium 140 135 - 145 mmol/L   Potassium 3.9 3.5 - 5.1 mmol/L   Chloride 105 101 - 111 mmol/L   CO2 29 22 - 32 mmol/L   Glucose, Bld 201 (H) 65 - 99 mg/dL   BUN 11 6 - 20 mg/dL   Creatinine, Ser 6.29 0.61 - 1.24 mg/dL   Calcium 8.7 (L) 8.9 - 10.3 mg/dL   Total Protein 7.1 6.5 - 8.1 g/dL   Albumin 4.0 3.5 - 5.0 g/dL   AST 22 15 - 41 U/L   ALT 25 17 - 63 U/L   Alkaline Phosphatase 49 38 - 126 U/L   Total Bilirubin 1.0 0.3 -  1.2 mg/dL   GFR calc non Af Amer >60 >60 mL/min   GFR calc Af Amer >60 >60 mL/min   Anion gap 6 5 - 15   ____________________________________________  EKG My review and personal interpretation at Time: 2:23   Indication: SOB  Rate: 95  Rhythm: sinus Axis: left Other: ivcd, normal intervals, no acute ST elevations or depressions ____________________________________________  RADIOLOGY  I personally reviewed all radiographic images ordered to evaluate for the above acute complaints and reviewed radiology reports and findings.  These findings were personally discussed with the patient.  Please see medical record for radiology report.  ____________________________________________   PROCEDURES  Procedure(s) performed: none    Critical Care performed: yes CRITICAL CARE Performed by: Willy EddyPatrick Corbitt Cloke   Total critical care time: 40 minutes  Critical care time was exclusive of separately billable procedures and treating other patients.  Critical care was necessary to treat or prevent imminent or life-threatening deterioration.  Critical care was time spent personally by me on the following activities: development of treatment plan with patient and/or surrogate as well as nursing, discussions  with consultants, evaluation of patient's response to treatment, examination of patient, obtaining history from patient or surrogate, ordering and performing treatments and interventions, ordering and review of laboratory studies, ordering and review of radiographic studies, pulse oximetry and re-evaluation of patient's condition.  ____________________________________________   INITIAL IMPRESSION / ASSESSMENT AND PLAN / ED COURSE  Pertinent labs & imaging results that were available during my care of the patient were reviewed by me and considered in my medical decision making (see chart for details).  DDX: Asthma, copd, CHF, pna, ptx, malignancy, Pe, anemia   George Hensley is a 82 y.o. who presents to the ED with acute respiratory failure with hypoxia. Patient arrives afebrile but tachycardic and in respiratory distress with severe tachypnea and hypoxia. Patient protecting his airway. Patient provided supplemental oxygen and emergent breathing treatments due to diffuse wheezing with prolonged expiratory phase. IV access was obtained and the patient was given IV magnesium as well as steroids. Patient denies chest pain and EKG shows no evidence of acute ischemia.  The patient will be placed on continuous pulse oximetry and telemetry for monitoring.  Laboratory evaluation will be sent to evaluate for the above complaints.     Clinical Course  Comment By Time  Patient reassessed. Respiratory rate is improving but patient still requiring supplemental oxygen. Presentation was consistent with pneumonia. We'll give additional nebulizer treatment. Based on his no acute hypoxic respiratory failure to feel patient will require admission for serial respiratory treatments and cardiac monitoring.  Have discussed with the patient and available family all diagnostics and treatments performed thus far and all questions were answered to the best of my ability. The patient demonstrates understanding and agreement  with plan.  Willy EddyPatrick Raghad Lorenz, MD 10/31 [phone]     ____________________________________________   FINAL CLINICAL IMPRESSION(S) / ED DIAGNOSES  Final diagnoses:  Acute respiratory failure with hypoxia (HCC)      NEW MEDICATIONS STARTED DURING THIS VISIT:  New Prescriptions   No medications on file     Note:  This document was prepared using Dragon voice recognition software and may include unintentional dictation errors.    Willy EddyPatrick Kierre Deines, MD 06/26/16 0530

## 2016-06-26 NOTE — Progress Notes (Signed)
Inpatient Diabetes Program Recommendations  AACE/ADA: New Consensus Statement on Inpatient Glycemic Control (2015)  Target Ranges:  Prepandial:   less than 140 mg/dL      Peak postprandial:   less than 180 mg/dL (1-2 hours)      Critically ill patients:  140 - 180 mg/dL   Lab Results  Component Value Date   GLUCAP 261 (H) 06/13/2016   HGBA1C 8.7 (H) 06/12/2016    Spoke with patient at the bedside.  Is aware of his high A1C of 8.7% - actively working with MD to get it down.  "doesn't help when I'm in here and they keep giving me this stuff" (referring to steroids).  George RacerJulie Koda Defrank, RN, BA, MHA, CDE Diabetes Coordinator Inpatient Diabetes Program  [phone] (Team Pager) [phone] [facility] Office) 06/26/2016 2:58 PM

## 2016-06-26 NOTE — Progress Notes (Signed)
Pharmacy Antibiotic Note  Courtney ParisJames F Lamphear is a 82 y.o. male admitted on 06/26/2016 with COPD exacerbation.  Pharmacy has been consulted for cefepime dosing.  Plan: Cefepime 2 grams q 8 hours ordered. Per MD patient has history of Pseudomonas infection.  Height: 5\' 10"  (177.8 cm) Weight: 170 lb (77.1 kg) IBW/kg (Calculated) : 73  Temp (24hrs), Avg:97.3 F (36.3 C), Min:97.3 F (36.3 C), Max:97.3 F (36.3 C)   Recent Labs Lab 06/26/16 0400  WBC 12.8*  CREATININE 0.93    Estimated Creatinine Clearance: 62.1 mL/min (by C-G formula based on SCr of 0.93 mg/dL).    No Known Allergies  Antimicrobials this admission: cefepime  >>    >>   Dose adjustments this admission:   Microbiology results: No micro this admission   10/31 CXR: no acute disease  Thank you for allowing pharmacy to be a part of this patient's care.  Yariah Selvey S 06/26/2016 5:40 AM

## 2016-06-26 NOTE — ED Triage Notes (Addendum)
Pt to room 16 via w/c with congested cough & wheezing noted; st [facility] tonight; prod cough yellow sputum; here recently for same and rx antibiotics; st was better; placed on card monitor and O2 at 3l/min via [place]

## 2016-06-26 NOTE — ED Notes (Signed)
Called regarding bed approval, charge RN to approve bed shortly per Camera operatorfloor secretary.

## 2016-06-26 NOTE — ED Notes (Signed)
Nettie ElmSylvia, RN requesting pt not be transported to floor until after shift change due to floor protected times.  Will pass on to next shift RN.

## 2016-06-27 LAB — BASIC METABOLIC PANEL
Anion gap: 9 (ref 5–15)
BUN: 28 mg/dL — AB (ref 6–20)
CALCIUM: 8.5 mg/dL — AB (ref 8.9–10.3)
CHLORIDE: 100 mmol/L — AB (ref 101–111)
CO2: 24 mmol/L (ref 22–32)
CREATININE: 1.46 mg/dL — AB (ref 0.61–1.24)
GFR, EST AFRICAN AMERICAN: 49 mL/min — AB (ref >60)
GFR, EST NON AFRICAN AMERICAN: 43 mL/min — AB (ref >60)
Glucose, Bld: 380 mg/dL — ABNORMAL HIGH (ref 65–99)
Potassium: 4.6 mmol/L (ref 3.5–5.1)
SODIUM: 133 mmol/L — AB (ref 135–145)

## 2016-06-27 LAB — CBC
HCT: 39.2 % — ABNORMAL LOW (ref 40.0–52.0)
Hemoglobin: 13.1 g/dL (ref 13.0–18.0)
MCH: 31.9 pg (ref 26.0–34.0)
MCHC: 33.3 g/dL (ref 32.0–36.0)
MCV: 95.8 fL (ref 80.0–100.0)
PLATELETS: 161 10*3/uL (ref 150–440)
RBC: 4.09 MIL/uL — ABNORMAL LOW (ref 4.40–5.90)
RDW: 13.6 % (ref 11.5–14.5)
WBC: 15.2 10*3/uL — AB (ref 3.8–10.6)

## 2016-06-27 LAB — EXPECTORATED SPUTUM ASSESSMENT W GRAM STAIN, RFLX TO RESP C: Special Requests: NORMAL

## 2016-06-27 LAB — GLUCOSE, CAPILLARY
GLUCOSE-CAPILLARY: 369 mg/dL — AB (ref 65–99)
Glucose-Capillary: 326 mg/dL — ABNORMAL HIGH (ref 65–99)

## 2016-06-27 LAB — EXPECTORATED SPUTUM ASSESSMENT W REFEX TO RESP CULTURE

## 2016-06-27 MED ORDER — PREDNISONE 20 MG PO TABS
40.0000 mg | ORAL_TABLET | Freq: Every day | ORAL | 0 refills | Status: DC
Start: 2016-06-27 — End: 2016-09-01

## 2016-06-27 MED ORDER — CIPROFLOXACIN HCL 500 MG PO TABS: 500.0000 mg | ORAL_TABLET | Freq: Two times a day (BID) | ORAL | 0 refills | Status: DC

## 2016-06-27 MED ORDER — MOMETASONE FURO-FORMOTEROL FUM 200-5 MCG/ACT IN AERO: 2.0000 | INHALATION_SPRAY | Freq: Two times a day (BID) | RESPIRATORY_TRACT | 0 refills | Status: DC

## 2016-06-27 NOTE — Progress Notes (Addendum)
Inpatient Diabetes Program Recommendations  AACE/ADA: New Consensus Statement on Inpatient Glycemic Control (2015)  Target Ranges:  Prepandial:   less than 140 mg/dL      Peak postprandial:   less than 180 mg/dL (1-2 hours)      Critically ill patients:  140 - 180 mg/dL   Lab Results  Component Value Date   GLUCAP 369 (H) 06/27/2016   HGBA1C 8.7 (H) 06/12/2016    Review of Glycemic Control:  Results for George Hensley, George Hensley (MRN [id number]) as of 06/27/2016 09:02  Ref. Range 06/26/2016 11:32 06/26/2016 16:30 06/26/2016 22:04 06/26/2016 23:14 06/27/2016 07:25  Glucose-Capillary Latest Ref Range: 65 - 99 mg/dL 409305 (H) 811290 (H) 914373 (H) 356 (H) 369 (H)    Diabetes history: Type 2 diabetes Outpatient Diabetes medications: Amaryl 4 mg bid, Metformin 1000 mg bid, Januvia 100 mg daily Current orders for Inpatient glycemic control:  Solumedrol 60 mg IV q 12 hours, Novolog resistant tid with meals and HS, Tradjenta 5 mg daily, Amaryl 4 mg bid, Metformin 1000 mg bid Inpatient Diabetes Program Recommendations:    While in the hospital, please consider adding Lantus 20 units daily. Also may consider d/c of Amaryl and instead start Novolog meal coverage 4 units tid with meals.    Thanks, Beryl MeagerJenny Leisl Spurrier, RN, BC-ADM Inpatient Diabetes Coordinator Pager [phone] (8a-5p)

## 2016-06-27 NOTE — Plan of Care (Signed)
MD making rounds. Received order to discharge home. IV Removed. Patient provided with Education Handout. Prescriptions E-Scribed to pharmacy. Discharge paperwork provided, explained, signed and witnessed. No unanswered questions. Discharged via wheelchair by nursing staff. Belongings sent with patient and family.

## 2016-06-27 NOTE — Discharge Instructions (Addendum)
° °  Acute Respiratory Distress Syndrome Acute respiratory distress syndrome is a life-threatening condition in which fluid collects in the lungs. This condition can cause severe shortness of breath and low oxygen levels in the blood. It can also cause the lungs and other vital organs to fail. The condition usually develops within 24 to 48 hours of an infection, illness, surgery, or injury. CAUSES This condition may be caused by:  An infection, such as sepsis or pneumonia.  A serious injury to the head or chest.  A major surgery.  A drug overdose.  Breathing in harmful chemicals or smoke.  Blood transfusions.  A blood clot in the lungs. SYMPTOMS Symptoms of this condition include:  Fever.  Difficulty breathing.  Bluish skin (cyanosis).  A fast or irregular heartbeat.  Low blood pressure (hypotension).  Agitation.  Confusion.  Lack of energy (lethargy).  Sweating. DIAGNOSIS This condition may be diagnosed by using tests to rule out other diseases and conditions that cause similar symptoms. You may have:  A chest X-ray or CT scan.  Blood tests.  A sputum culture. In this test, you will be asked to spit so that a sample of lung fluid can be taken for testing.  Bronchoscopy. During this test a thin, flexible tool is passed into the mouth or nose, down the windpipe, and into the lungs. TREATMENT This condition may be treated with:  Oxygen. A breathing machine (ventilator) is often used to provide oxygen and help with breathing.  Medicine to help you relax (sedative).  Fluids and nutrients given through an IV tube.  Blood pressure medicine.  Steroid medicine to help decrease inflammation in the lungs.  Diuretic medicine to get rid of extra fluid in the body. Additional treatment may be needed, depending on the cause of the condition. It can take up to 12 months to recover from this condition. Some people recover fully, but others may continue to  have:  Weakness.  Shortness of breath.  Memory problems.  Depression. HOME CARE INSTRUCTIONS Until you recover from this condition:  Do not smoke.  Limit alcohol intake to no more than 1 drink per day for nonpregnant women and 2 drinks per day for men. One drink equals 12 oz of beer, 5 oz of wine, or 1 oz of hard liquor.  Ask friends and family to help you if daily activities make you tired. SEEK MEDICAL CARE IF:  You become short of breath with activity or while resting.  You develop a cough that does not go away.  You have a fever. SEEK IMMEDIATE MEDICAL CARE IF:  You have sudden shortness of breath.  You develop chest pain that does not go away.  You develop swelling or pain in one of your legs.  You cough up blood.   This information is not intended to replace advice given to you by your health care provider. Make sure you discuss any questions you have with your health care provider.   Document Released: 08/13/2005 Document Revised: 12/28/2014 Document Reviewed: 08/09/2014 Elsevier Interactive Patient Education 2016 [employer] Resume diet and activity as before

## 2016-06-28 NOTE — Discharge Summary (Signed)
[employer] [facility] [place] [facility]   PATIENT NAME: George Hensley    MR#:  [id number]  DATE OF BIRTH:  08-Apr-1931  DATE OF ADMISSION:  06/26/2016 ADMITTING PHYSICIAN: Ihor Austin, MD  DATE OF DISCHARGE: 06/27/2016  2:20 PM  PRIMARY CARE PHYSICIAN: Danella Penton, MD   ADMISSION DIAGNOSIS:  Acute respiratory failure with hypoxia (HCC) [J96.01]  DISCHARGE DIAGNOSIS:  Principal Problem:   Dyspnea Active Problems:   Asthma exacerbation   SECONDARY DIAGNOSIS:   Past Medical History:  Diagnosis Date  . Asthma   . Bronchiectasis (HCC)   . Diabetes mellitus      ADMITTING HISTORY  HISTORY OF PRESENT ILLNESS: George Hensley  is a 82 y.o. male with a known history of Bronchiectasis, bronchial asthma, diabetes mellitus presented to the emergency room with increased shortness of breath for the last few days. Patient has cough productive of yellowish phlegm, no history of any fever or chills. Patient has been short of breath and does not use any home oxygen. No complaints of any chest pain. Patient was treated in the past at our hospital for pneumonia and he follows up at the pulmonary division at [facility]. Patient was diagnosed with bronchiectasis 4 years ago. Has generalized weakness. Patient was given nebulization treatments in the emergency room and stabilized. Patient had Pseudomonas infection in the past for which she received broad-spectrum antibiotics.  HOSPITAL COURSE:   * Acute bronchitis with history of bronchiectasis Patient was started on IV steroids along with antibiotics and nebulizer treatment. The second day of admission patient improved well. He feels back to baseline. Off oxygen at rest and on ambulation. Afebrile. Sputum cultures are pending but seem like normal flora. He is afebrile with normal WBC. Patient is being discharged home and has follow-up with his pulmonologist at [facility] in 1 week. Prednisone and ciprofloxacin along with inhalers at  discharge.  Diabetes was uncontrolled during the hospital stay due to high-dose IV steroids. The dose is being reduced to discharge.  CONSULTS OBTAINED:    DRUG ALLERGIES:  No Known Allergies  DISCHARGE MEDICATIONS:   Discharge Medication List as of 06/27/2016  1:58 PM    START taking these medications   Details  ciprofloxacin (CIPRO) 500 MG tablet Take 1 tablet (500 mg total) by mouth 2 (two) times daily., Starting Wed 06/27/2016, Normal    predniSONE (DELTASONE) 20 MG tablet Take 2 tablets (40 mg total) by mouth daily with breakfast., Starting Wed 06/27/2016, Normal      CONTINUE these medications which have CHANGED   Details  mometasone-formoterol (DULERA) 200-5 MCG/ACT AERO Inhale 2 puffs into the lungs 2 (two) times daily., Starting Wed 06/27/2016, Normal      CONTINUE these medications which have NOT CHANGED   Details  albuterol (PROVENTIL) (2.5 MG/3ML) 0.083% nebulizer solution Take 2.5 mg by nebulization 2 (two) times daily., Starting Tue 11/15/2015, Until Wed 11/14/2016, Historical Med    aspirin EC 81 MG tablet Take 81 mg by mouth daily. , Starting Mon 09/22/2012, Historical Med    diltiazem (CARDIZEM LA) 120 MG 24 hr tablet Take 120 mg by mouth daily., Historical Med    glimepiride (AMARYL) 4 MG tablet Take 4 mg by mouth 2 (two) times daily., Historical Med    pravastatin (PRAVACHOL) 10 MG tablet Take 10 mg by mouth daily., Historical Med    tiotropium (SPIRIVA) 18 MCG inhalation capsule Place 18 mcg into inhaler and inhale daily., Historical Med    VENTOLIN HFA 108 (  90 Base) MCG/ACT inhaler Inhale 1 puff into the lungs every 6 (six) hours as needed., Starting Mon 06/25/2016, Historical Med    Cholecalciferol (VITAMIN D-1000 MAX ST) 1000 units tablet Take 1 tablet by mouth 3 (three) times a week., Historical Med    metFORMIN (GLUCOPHAGE) 1000 MG tablet Take 1,000 mg by mouth 2 (two) times daily., Historical Med    sitaGLIPtin (JANUVIA) 100 MG tablet Take 1 tablet by  mouth daily., Historical Med        Today   VITAL SIGNS:  Blood pressure 122/62, pulse 97, temperature 97.9 F (36.6 C), temperature source Oral, resp. rate 20, height 5\' 10"  (1.778 m), weight 77.1 kg (170 lb), SpO2 100 %.  I/O:  No intake or output data in the 24 hours ending 06/28/16 1622  PHYSICAL EXAMINATION:  Physical Exam  GENERAL:  82 y.o.-year-old patient lying in the bed with no acute distress.  LUNGS: Normal breath sounds bilaterally, no wheezing, rales,rhonchi or crepitation. No use of accessory muscles of respiration.  CARDIOVASCULAR: S1, S2 normal. No murmurs, rubs, or gallops.  ABDOMEN: Soft, non-tender, non-distended. Bowel sounds present. No organomegaly or mass.  NEUROLOGIC: Moves all 4 extremities. PSYCHIATRIC: The patient is alert and oriented x 3.  SKIN: No obvious rash, lesion, or ulcer.   DATA REVIEW:   CBC  Recent Labs Lab 06/27/16 0406  WBC 15.2*  HGB 13.1  HCT 39.2*  PLT 161    Chemistries   Recent Labs Lab 06/26/16 0222  06/26/16 0400 06/27/16 0406  NA  --   < > 140 133*  K  --   < > 3.9 4.6  CL  --   < > 105 100*  CO2  --   < > 29 24  GLUCOSE  --   < > 201* 380*  BUN  --   < > 11 28*  CREATININE  --   < > 0.93 1.46*  CALCIUM  --   < > 8.7* 8.5*  MG 1.8  --   --   --   AST  --   --  22  --   ALT  --   --  25  --   ALKPHOS  --   --  49  --   BILITOT  --   --  1.0  --   < > = values in this interval not displayed.  Cardiac Enzymes  Recent Labs Lab 06/26/16 0222  TROPONINI <0.03    Microbiology Results  Results for orders placed or performed during the hospital encounter of 06/26/16  Culture, expectorated sputum-assessment     Status: None   Collection Time: 06/26/16  8:44 PM  Result Value Ref Range Status   Specimen Description SPUTUM  Final   Special Requests Normal  Final   Sputum evaluation THIS SPECIMEN IS ACCEPTABLE FOR SPUTUM CULTURE  Final   Report Status 06/27/2016 FINAL  Final  Culture, respiratory  (NON-Expectorated)     Status: None (Preliminary result)   Collection Time: 06/26/16  8:44 PM  Result Value Ref Range Status   Specimen Description SPUTUM  Final   Special Requests Normal Reflexed from [id number]  Final   Gram Stain   Final    MODERATE WBC PRESENT,BOTH PMN AND MONONUCLEAR MODERATE GRAM NEGATIVE RODS MODERATE GRAM NEGATIVE COCCOBACILLI ABUNDANT GRAM POSITIVE COCCI IN CLUSTERS FEW YEAST FEW GRAM POSITIVE RODS    Culture   Final    CULTURE REINCUBATED FOR BETTER GROWTH [facility]  Report Status PENDING  Incomplete    RADIOLOGY:  No results found.  Follow up with PCP in 1 week.  Management plans discussed with the patient, family and they are in agreement.  CODE STATUS:  Code Status History    Date Active Date Inactive Code Status Order ID Comments User Context   06/26/2016  7:46 AM 06/27/2016  8:41 PM Full Code [id number]  Ihor AustinPavan Pyreddy, MD Inpatient   06/12/2016  9:41 AM 06/13/2016  3:07 PM Full Code [id number]  Adrian SaranSital Mody, MD Inpatient   08/02/2015  8:19 AM 08/03/2015  2:30 PM Full Code [id number]  Arnaldo NatalMichael S Diamond, MD Inpatient    Advance Directive Documentation   Flowsheet Row Most Recent Value  Type of Advance Directive  Healthcare Power of Attorney  Pre-existing out of facility DNR order (yellow form or pink MOST form)  No data  "MOST" Form in Place?  No data      TOTAL TIME TAKING CARE OF THIS PATIENT ON DAY OF DISCHARGE: more than 30 minutes.   Milagros LollSudini, [place] Knaus R M.D on 06/28/2016 at 4:22 PM  Between 7am to 6pm - Pager - [phone]  After 6pm go to [email or web address] - password [email or web address]  [facility] [place] [facility]  Office  [phone]  CC: Primary care physician; Danella PentonMark F Miller, MD  Note: This dictation was prepared with Dragon dictation along with smaller phrase technology. Any transcriptional errors that result from this process are unintentional.

## 2016-07-01 LAB — CULTURE, RESPIRATORY W GRAM STAIN

## 2016-07-01 LAB — CULTURE, RESPIRATORY: SPECIAL REQUESTS: NORMAL

## 2016-09-01 ENCOUNTER — Encounter: Payer: Self-pay | Admitting: Emergency Medicine

## 2016-09-01 ENCOUNTER — Emergency Department: Payer: Medicare Other

## 2016-09-01 ENCOUNTER — Emergency Department
Admission: EM | Admit: 2016-09-01 | Discharge: 2016-09-01 | Disposition: A | Discharge status: Home / Self Care | Payer: Medicare Other | Attending: Emergency Medicine | Admitting: Emergency Medicine

## 2016-09-01 DIAGNOSIS — E119 Type 2 diabetes mellitus without complications: Secondary | ICD-10-CM | POA: Insufficient documentation

## 2016-09-01 DIAGNOSIS — Z7982 Long term (current) use of aspirin: Secondary | ICD-10-CM | POA: Insufficient documentation

## 2016-09-01 DIAGNOSIS — Z7984 Long term (current) use of oral hypoglycemic drugs: Secondary | ICD-10-CM | POA: Diagnosis not present

## 2016-09-01 DIAGNOSIS — Z79899 Other long term (current) drug therapy: Secondary | ICD-10-CM | POA: Insufficient documentation

## 2016-09-01 DIAGNOSIS — F17220 Nicotine dependence, chewing tobacco, uncomplicated: Secondary | ICD-10-CM | POA: Insufficient documentation

## 2016-09-01 DIAGNOSIS — R0602 Shortness of breath: Secondary | ICD-10-CM | POA: Diagnosis present

## 2016-09-01 DIAGNOSIS — J9801 Acute bronchospasm: Secondary | ICD-10-CM | POA: Diagnosis not present

## 2016-09-01 LAB — CBC WITH DIFFERENTIAL/PLATELET
BASOS PCT: 1 %
Basophils Absolute: 0 10*3/uL (ref 0–0.1)
Eosinophils Absolute: 1.1 10*3/uL — ABNORMAL HIGH (ref 0–0.7)
Eosinophils Relative: 12 %
HEMATOCRIT: 39.1 % — AB (ref 40.0–52.0)
HEMOGLOBIN: 13.3 g/dL (ref 13.0–18.0)
LYMPHS ABS: 1.4 10*3/uL (ref 1.0–3.6)
Lymphocytes Relative: 16 %
MCH: 32.3 pg (ref 26.0–34.0)
MCHC: 34 g/dL (ref 32.0–36.0)
MCV: 95.1 fL (ref 80.0–100.0)
MONOS PCT: 8 %
Monocytes Absolute: 0.7 10*3/uL (ref 0.2–1.0)
NEUTROS ABS: 5.6 10*3/uL (ref 1.4–6.5)
NEUTROS PCT: 63 %
Platelets: 200 10*3/uL (ref 150–440)
RBC: 4.11 MIL/uL — ABNORMAL LOW (ref 4.40–5.90)
RDW: 14.8 % — ABNORMAL HIGH (ref 11.5–14.5)
WBC: 8.8 10*3/uL (ref 3.8–10.6)

## 2016-09-01 LAB — BASIC METABOLIC PANEL
Anion gap: 6 (ref 5–15)
BUN: 21 mg/dL — AB (ref 6–20)
CHLORIDE: 103 mmol/L (ref 101–111)
CO2: 27 mmol/L (ref 22–32)
Calcium: 8.7 mg/dL — ABNORMAL LOW (ref 8.9–10.3)
Creatinine, Ser: 1.2 mg/dL (ref 0.61–1.24)
GFR calc Af Amer: >60 mL/min (ref >60)
GFR calc non Af Amer: 54 mL/min — ABNORMAL LOW (ref >60)
Glucose, Bld: 190 mg/dL — ABNORMAL HIGH (ref 65–99)
POTASSIUM: 4.3 mmol/L (ref 3.5–5.1)
SODIUM: 136 mmol/L (ref 135–145)

## 2016-09-01 LAB — TROPONIN I: Troponin I: <0.03 ng/mL (ref <0.03)

## 2016-09-01 MED ORDER — IPRATROPIUM-ALBUTEROL 0.5-2.5 (3) MG/3ML IN SOLN
3.0000 mL | Freq: Once | RESPIRATORY_TRACT | Status: AC
  Administered 2016-09-01: 3 mL via RESPIRATORY_TRACT
  Filled 2016-09-01: qty 3

## 2016-09-01 MED ORDER — PREDNISONE 10 MG PO TABS: ORAL_TABLET | ORAL | 0 refills | Status: DC

## 2016-09-01 MED ORDER — PREDNISONE 20 MG PO TABS
50.0000 mg | ORAL_TABLET | Freq: Once | ORAL | Status: AC
  Administered 2016-09-01: 50 mg via ORAL
  Filled 2016-09-01: qty 2

## 2016-09-01 NOTE — ED Provider Notes (Signed)
[facility] Emergency Department Provider Note ____________________________________________   I have reviewed the triage vital signs and the triage nursing note.  HISTORY  Chief Complaint Shortness of Breath   Historian Patient and wife  HPI George Hensley is a 83 y.o. male with history of asthma and bronchiectasis presents with 1-2 weeks of wet but nonproductive cough for which he called his pulmonologist at [facility] about and received a phone prescription for Bactrim and he completed yesterday. However he's had persistent cough and dyspnea. This morning he was very short of breath while coughing and his wife said that he looked pale and that he might need oxygen. He took his rescue inhaler less than hour ago and feels much better.  He was admitted to the hospital with asthma exacerbation in November and his wife wanted him checked out earlier so that he could hopefully prevent hospitalization.  Denies fever. Denies nausea or vomiting or abdominal pain. Denies chest pain. Denies weakness or numbness.    Past Medical History:  Diagnosis Date  . Asthma   . Bronchiectasis (HCC)   . Diabetes mellitus     Patient Active Problem List   Diagnosis Date Noted  . Dyspnea 06/26/2016  . Asthma exacerbation 06/26/2016  . Hypoxia 06/12/2016  . Acute on chronic respiratory failure with hypoxia (HCC) 08/02/2015  . Asthma 04/04/2011  . Pulmonary nodule 03/06/2011    Past Surgical History:  Procedure Laterality Date  . CHOLECYSTECTOMY  2000  . CORONARY STENT PLACEMENT  1999    Prior to Admission medications   Medication Sig Start Date End Date Taking? Authorizing Provider  albuterol (PROVENTIL) (2.5 MG/3ML) 0.083% nebulizer solution Take 2.5 mg by nebulization 2 (two) times daily. 11/15/15 11/14/16  Historical Provider, MD  aspirin EC 81 MG tablet Take 81 mg by mouth daily.  09/22/12   Historical Provider, MD  Cholecalciferol (VITAMIN D-1000 MAX ST) 1000 units tablet  Take 1 tablet by mouth 3 (three) times a week.    Historical Provider, MD  ciprofloxacin (CIPRO) 500 MG tablet Take 1 tablet (500 mg total) by mouth 2 (two) times daily. 06/27/16   Srikar Sudini, MD  diltiazem (CARDIZEM LA) 120 MG 24 hr tablet Take 120 mg by mouth daily.    Historical Provider, MD  glimepiride (AMARYL) 4 MG tablet Take 4 mg by mouth 2 (two) times daily.    Historical Provider, MD  metFORMIN (GLUCOPHAGE) 1000 MG tablet Take 1,000 mg by mouth 2 (two) times daily.    Historical Provider, MD  mometasone-formoterol (DULERA) 200-5 MCG/ACT AERO Inhale 2 puffs into the lungs 2 (two) times daily. 06/27/16   Srikar Sudini, MD  pravastatin (PRAVACHOL) 10 MG tablet Take 10 mg by mouth daily.    Historical Provider, MD  predniSONE (DELTASONE) 20 MG tablet Take 2 tablets (40 mg total) by mouth daily with breakfast. 06/27/16   Milagros Loll, MD  sitaGLIPtin (JANUVIA) 100 MG tablet Take 1 tablet by mouth daily.    Historical Provider, MD  tiotropium (SPIRIVA) 18 MCG inhalation capsule Place 18 mcg into inhaler and inhale daily.    Historical Provider, MD  VENTOLIN HFA 108 (90 Base) MCG/ACT inhaler Inhale 1 puff into the lungs every 6 (six) hours as needed. 06/25/16   Historical Provider, MD    No Known Allergies  Family History  Problem Relation Age of Onset  . Asthma Brother     Social History Social History  Substance Use Topics  . Smoking status: Never Smoker  .  Smokeless tobacco: Current User    Types: Chew  . Alcohol use Yes     Comment: socially last drink 2 weeks ago    Review of Systems  Constitutional: Negative for fever. Eyes: Negative for visual changes. ENT: Negative for sore throat. Cardiovascular: Negative for chest pain. Respiratory: Positive for shortness of breath. Gastrointestinal: Negative for abdominal pain, vomiting and diarrhea. Genitourinary: Negative for dysuria. Musculoskeletal: Negative for back pain. Skin: Negative for rash. Neurological: Negative for  headache. 10 point Review of Systems otherwise negative ____________________________________________   PHYSICAL EXAM:  VITAL SIGNS: ED Triage Vitals [09/01/16 1049]  Enc Vitals Group     BP 122/71     Pulse Rate 85     Resp 20     Temp 97.7 F (36.5 C)     Temp Source Oral     SpO2 94 %     Weight 170 lb (77.1 kg)     Height 5\' 10"  (1.778 m)     Head Circumference      Peak Flow      Pain Score      Pain Loc      Pain Edu?      Excl. in GC?      Constitutional: Alert and oriented. Well appearing and in no distress. HEENT   Head: Normocephalic and atraumatic.      Eyes: Conjunctivae are normal. PERRL. Normal extraocular movements.      Ears:         Nose: No congestion/rhinnorhea.   Mouth/Throat: Mucous membranes are moist.   Neck: No stridor. Cardiovascular/Chest: Normal rate, regular rhythm.  No murmurs, rubs, or gallops. Respiratory: Normal respiratory effort without tachypnea nor retractions. Mild wheezing at rest, but able to speak full sentences. Mild rhonchi posteriorly. Intermittent cough that sounds very rhonchorous. Gastrointestinal: Soft. No distention, no guarding, no rebound. Nontender.    Genitourinary/rectal:Deferred Musculoskeletal: Nontender with normal range of motion in all extremities. No joint effusions.  No lower extremity tenderness.  No edema. Neurologic:  Normal speech and language. No gross or focal neurologic deficits are appreciated. Skin:  Skin is warm, dry and intact. No rash noted. Psychiatric: Mood and affect are normal. Speech and behavior are normal. Patient exhibits appropriate insight and judgment.   ____________________________________________  LABS (pertinent positives/negatives)  Labs Reviewed  CBC WITH DIFFERENTIAL/PLATELET - Abnormal; Notable for the following:       Result Value   RBC 4.11 (*)    HCT 39.1 (*)    RDW 14.8 (*)    Eosinophils Absolute 1.1 (*)    All other components within normal limits  BASIC  METABOLIC PANEL - Abnormal; Notable for the following:    Glucose, Bld 190 (*)    BUN 21 (*)    Calcium 8.7 (*)    GFR calc non Af Amer 54 (*)    All other components within normal limits  TROPONIN I    ____________________________________________    EKG I, Governor Rooksebecca Sumedh Shinsato, MD, the attending physician have personally viewed and interpreted all ECGs.  80 bpm. Normal sinus rhythm. Left axis deviation. Nonspecific intraventricular conduction delay. Nonspecific T wave. ____________________________________________  RADIOLOGY All Xrays were viewed by me. Imaging interpreted by Radiologist.  Chest x-ray two-view:  IMPRESSION: Stable chest x-ray without acute cardiopulmonary process.  COPD and chronic bronchitic changes suggest underlying COPD. __________________________________________  PROCEDURES  Procedure(s) performed: None  Critical Care performed: None  ____________________________________________   ED COURSE / ASSESSMENT AND PLAN  Pertinent labs & imaging  results that were available during my care of the patient were reviewed by me and considered in my medical decision making (see chart for details).   George Hensley is here for wheezing and shortness of breath and what I suspect is likely clinical bronchospasm/asthma exacerbation. He just finished a course of Bactrim which was called in by his pulmonologist for complaint of shortness of breath last week, but is not much better. He is not reporting other symptoms that sound highly concerning for pulmonary infection.  No hypoxia. Patient improved with albuterol. I think is okay for outpatient management. I did write him a note for his travel insurance as he a were supposed to be traveling today for a cruise.  Laboratory studies are reassuring. After DuoNeb treatment and he feels improved likely improved and with walking O2 sat his O2 sat is above 90%. For these okay for outpatient management.    CONSULTATIONS:   None   Patient / Family / Caregiver informed of clinical course, medical decision-making process, and agree with plan.   I discussed return precautions, follow-up instructions, and discharge instructions with patient and/or family.   ___________________________________________   FINAL CLINICAL IMPRESSION(S) / ED DIAGNOSES   Final diagnoses:  Acute bronchospasm              Note: This dictation was prepared with Dragon dictation. Any transcriptional errors that result from this process are unintentional    Governor Rooks, MD 09/01/16 1218

## 2016-09-01 NOTE — ED Triage Notes (Signed)
Pt c/o SOB starting this morning. Used rescue inhaler with positive results but pt and wife wanted him checked out. Hx bronchiectasis and was admitted to [facility] 10/17. Pt states he is trying to prevent another admission by coming in earlier. No SOB at this time.

## 2016-09-01 NOTE — ED Notes (Signed)
Pt ambulated with no issues. Oxygen saturations remained 96% r/a

## 2016-09-01 NOTE — Discharge Instructions (Signed)
You're being treated for asthma also called bronco spasm with prednisone for 4 more days. Please also take albuterol inhaler 2 puffs every 4 hours for the next 2-3 days and then as needed for shortness of breath or wheezing.  Return to the emergency room for any worsening shortness of breath, chest pain, fever, confusion or altered mental status, or any other symptoms concerning to you.

## 2016-10-08 DIAGNOSIS — R3914 Feeling of incomplete bladder emptying: Secondary | ICD-10-CM | POA: Diagnosis not present

## 2016-10-08 DIAGNOSIS — N401 Enlarged prostate with lower urinary tract symptoms: Secondary | ICD-10-CM | POA: Diagnosis not present

## 2016-10-08 DIAGNOSIS — N5201 Erectile dysfunction due to arterial insufficiency: Secondary | ICD-10-CM | POA: Diagnosis not present

## 2016-10-08 DIAGNOSIS — R3915 Urgency of urination: Secondary | ICD-10-CM | POA: Diagnosis not present

## 2016-10-24 DIAGNOSIS — I10 Essential (primary) hypertension: Secondary | ICD-10-CM | POA: Diagnosis not present

## 2016-10-24 DIAGNOSIS — E782 Mixed hyperlipidemia: Secondary | ICD-10-CM | POA: Diagnosis not present

## 2016-10-24 DIAGNOSIS — I2510 Atherosclerotic heart disease of native coronary artery without angina pectoris: Secondary | ICD-10-CM | POA: Diagnosis not present

## 2016-11-15 DIAGNOSIS — Z125 Encounter for screening for malignant neoplasm of prostate: Secondary | ICD-10-CM | POA: Diagnosis not present

## 2016-11-15 DIAGNOSIS — E538 Deficiency of other specified B group vitamins: Secondary | ICD-10-CM | POA: Diagnosis not present

## 2016-11-15 DIAGNOSIS — E119 Type 2 diabetes mellitus without complications: Secondary | ICD-10-CM | POA: Diagnosis not present

## 2016-11-22 DIAGNOSIS — E119 Type 2 diabetes mellitus without complications: Secondary | ICD-10-CM | POA: Diagnosis not present

## 2016-11-22 DIAGNOSIS — Z0000 Encounter for general adult medical examination without abnormal findings: Secondary | ICD-10-CM | POA: Diagnosis not present

## 2016-11-22 DIAGNOSIS — E538 Deficiency of other specified B group vitamins: Secondary | ICD-10-CM | POA: Diagnosis not present

## 2016-11-22 DIAGNOSIS — J479 Bronchiectasis, uncomplicated: Secondary | ICD-10-CM | POA: Diagnosis not present

## 2016-12-11 DIAGNOSIS — J4540 Moderate persistent asthma, uncomplicated: Secondary | ICD-10-CM | POA: Diagnosis not present

## 2016-12-11 DIAGNOSIS — J471 Bronchiectasis with (acute) exacerbation: Secondary | ICD-10-CM | POA: Diagnosis not present

## 2016-12-11 DIAGNOSIS — J439 Emphysema, unspecified: Secondary | ICD-10-CM | POA: Diagnosis not present

## 2016-12-11 DIAGNOSIS — J411 Mucopurulent chronic bronchitis: Secondary | ICD-10-CM | POA: Diagnosis not present

## 2016-12-14 DIAGNOSIS — J4540 Moderate persistent asthma, uncomplicated: Secondary | ICD-10-CM | POA: Diagnosis not present

## 2016-12-14 DIAGNOSIS — J471 Bronchiectasis with (acute) exacerbation: Secondary | ICD-10-CM | POA: Diagnosis not present

## 2016-12-14 DIAGNOSIS — J439 Emphysema, unspecified: Secondary | ICD-10-CM | POA: Diagnosis not present

## 2016-12-21 DIAGNOSIS — J4540 Moderate persistent asthma, uncomplicated: Secondary | ICD-10-CM | POA: Diagnosis not present

## 2017-01-06 ENCOUNTER — Emergency Department
Admission: EM | Admit: 2017-01-06 | Discharge: 2017-01-06 | Disposition: A | Discharge status: Home / Self Care | Payer: Medicare Other | Attending: Emergency Medicine | Admitting: Emergency Medicine

## 2017-01-06 ENCOUNTER — Emergency Department: Payer: Medicare Other

## 2017-01-06 DIAGNOSIS — Z7982 Long term (current) use of aspirin: Secondary | ICD-10-CM | POA: Insufficient documentation

## 2017-01-06 DIAGNOSIS — R062 Wheezing: Secondary | ICD-10-CM

## 2017-01-06 DIAGNOSIS — R0602 Shortness of breath: Secondary | ICD-10-CM | POA: Insufficient documentation

## 2017-01-06 DIAGNOSIS — E119 Type 2 diabetes mellitus without complications: Secondary | ICD-10-CM | POA: Diagnosis not present

## 2017-01-06 DIAGNOSIS — J45909 Unspecified asthma, uncomplicated: Secondary | ICD-10-CM | POA: Diagnosis not present

## 2017-01-06 DIAGNOSIS — Z79899 Other long term (current) drug therapy: Secondary | ICD-10-CM | POA: Insufficient documentation

## 2017-01-06 DIAGNOSIS — Z955 Presence of coronary angioplasty implant and graft: Secondary | ICD-10-CM | POA: Insufficient documentation

## 2017-01-06 DIAGNOSIS — Z7984 Long term (current) use of oral hypoglycemic drugs: Secondary | ICD-10-CM | POA: Insufficient documentation

## 2017-01-06 LAB — CBC WITH DIFFERENTIAL/PLATELET
BASOS PCT: 1 %
Basophils Absolute: 0 10*3/uL (ref 0–0.1)
Eosinophils Absolute: 0.5 10*3/uL (ref 0–0.7)
Eosinophils Relative: 5 %
HEMATOCRIT: 42.8 % (ref 40.0–52.0)
HEMOGLOBIN: 14.5 g/dL (ref 13.0–18.0)
LYMPHS ABS: 1.3 10*3/uL (ref 1.0–3.6)
Lymphocytes Relative: 14 %
MCH: 31.6 pg (ref 26.0–34.0)
MCHC: 33.8 g/dL (ref 32.0–36.0)
MCV: 93.7 fL (ref 80.0–100.0)
MONOS PCT: 4 %
Monocytes Absolute: 0.4 10*3/uL (ref 0.2–1.0)
NEUTROS ABS: 7.3 10*3/uL — AB (ref 1.4–6.5)
NEUTROS PCT: 76 %
Platelets: 173 10*3/uL (ref 150–440)
RBC: 4.57 MIL/uL (ref 4.40–5.90)
RDW: 14.3 % (ref 11.5–14.5)
WBC: 9.5 10*3/uL (ref 3.8–10.6)

## 2017-01-06 LAB — COMPREHENSIVE METABOLIC PANEL
ALBUMIN: 4.2 g/dL (ref 3.5–5.0)
ALK PHOS: 39 U/L (ref 38–126)
ALT: 41 U/L (ref 17–63)
ANION GAP: 7 (ref 5–15)
AST: 37 U/L (ref 15–41)
BILIRUBIN TOTAL: 1 mg/dL (ref 0.3–1.2)
BUN: 13 mg/dL (ref 6–20)
CALCIUM: 9.1 mg/dL (ref 8.9–10.3)
CO2: 28 mmol/L (ref 22–32)
Chloride: 103 mmol/L (ref 101–111)
Creatinine, Ser: 0.85 mg/dL (ref 0.61–1.24)
GFR calc Af Amer: >60 mL/min (ref >60)
GFR calc non Af Amer: >60 mL/min (ref >60)
GLUCOSE: 259 mg/dL — AB (ref 65–99)
Potassium: 4.6 mmol/L (ref 3.5–5.1)
Sodium: 138 mmol/L (ref 135–145)
TOTAL PROTEIN: 7.1 g/dL (ref 6.5–8.1)

## 2017-01-06 LAB — TROPONIN I: Troponin I: <0.03 ng/mL (ref <0.03)

## 2017-01-06 MED ORDER — IPRATROPIUM-ALBUTEROL 0.5-2.5 (3) MG/3ML IN SOLN
3.0000 mL | Freq: Once | RESPIRATORY_TRACT | Status: AC
  Administered 2017-01-06: 3 mL via RESPIRATORY_TRACT

## 2017-01-06 MED ORDER — ALBUTEROL SULFATE (2.5 MG/3ML) 0.083% IN NEBU
2.5000 mg | INHALATION_SOLUTION | Freq: Once | RESPIRATORY_TRACT | Status: AC
Start: 2017-01-06 — End: 2017-01-06
  Administered 2017-01-06: 2.5 mg via RESPIRATORY_TRACT
  Filled 2017-01-06: qty 3

## 2017-01-06 MED ORDER — MAGNESIUM SULFATE 2 GM/50ML IV SOLN
2.0000 g | Freq: Once | INTRAVENOUS | Status: AC
  Administered 2017-01-06: 2 g via INTRAVENOUS

## 2017-01-06 MED ORDER — METHYLPREDNISOLONE SODIUM SUCC 125 MG IJ SOLR
125.0000 mg | Freq: Once | INTRAMUSCULAR | Status: AC
  Administered 2017-01-06: 125 mg via INTRAVENOUS

## 2017-01-06 MED ORDER — PREDNISONE 20 MG PO TABS: 60.0000 mg | ORAL_TABLET | Freq: Every day | ORAL | 0 refills | Status: DC

## 2017-01-06 NOTE — ED Notes (Signed)
Pt reduced from 4L O2 to 3L by this RN. Pt is breathing easier at this time and states that he feels much better. Will continue to monitor.

## 2017-01-06 NOTE — ED Notes (Signed)
Per MD Zenda AlpersWebster, original EKG unreadable. EKG has been repeated at this time byt this RN.

## 2017-01-06 NOTE — ED Notes (Signed)
Pt decreased from 3L O2 to 2L which is what he chronically wears at home. Pt at 96% on 2L.

## 2017-01-06 NOTE — ED Triage Notes (Signed)
Pt arrives to ed with shob. Pt with labored breathing noted, wheezing, abd muscle use noted, able to speak in short clipped sentences. Pt is on oxygen at home at 2lpm via [place] chronically. Pt to room 8.

## 2017-01-06 NOTE — ED Provider Notes (Signed)
[facility] Emergency Department Provider Note   ____________________________________________   First MD Initiated Contact with Patient 01/06/17 0120     (approximate)  I have reviewed the triage vital signs and the nursing notes.   HISTORY  Chief Complaint Shortness of Breath    HPI George Hensley is a 83 y.o. male who comes into the hospital today with difficulty breathing. The patient's wife reports that this occurs frequently. He has a pulmonologist which she sees in [place]. He is currently on inhaled tobramycin. The patient's wife thinks he may be having a reaction to the medication. He's been on it for 3 weeks and the side effects include worsening condition, weakness and cough. The patient's shortness of breath reached a peak tonight. She does not notice that he's coughing more and he is also not coughing less. The patient has no chest pain no fevers no dizziness but he's been very weak and has decreased energy. The patient's wife reports that when his oxygen and his low he feels the most week. They have been speaking with her pulmonologist and trying different things. The patient is also taking erythromycin according to the patient's wife. She reports he has a staph infection in his lungs as well as pseudomonas. The patient is here today for treatment of his shortness of breath.    Past Medical History:  Diagnosis Date  . Asthma   . Bronchiectasis (HCC)   . Diabetes mellitus     Patient Active Problem List   Diagnosis Date Noted  . Dyspnea 06/26/2016  . Asthma exacerbation 06/26/2016  . Hypoxia 06/12/2016  . Acute on chronic respiratory failure with hypoxia (HCC) 08/02/2015  . Asthma 04/04/2011  . Pulmonary nodule 03/06/2011    Past Surgical History:  Procedure Laterality Date  . CHOLECYSTECTOMY  2000  . CORONARY STENT PLACEMENT  1999    Prior to Admission medications   Medication Sig Start Date End Date Taking? Authorizing  Provider  aspirin EC 81 MG tablet Take 81 mg by mouth daily.  09/22/12   [provider]  Cholecalciferol (VITAMIN D-1000 MAX ST) 1000 units tablet Take 1 tablet by mouth 3 (three) times a week.    [provider]  ciprofloxacin (CIPRO) 500 MG tablet Take 1 tablet (500 mg total) by mouth 2 (two) times daily. 06/27/16   Milagros LollSudini, Srikar, MD  diltiazem (CARDIZEM LA) 120 MG 24 hr tablet Take 120 mg by mouth daily.    [provider]  glimepiride (AMARYL) 4 MG tablet Take 4 mg by mouth 2 (two) times daily.    [provider]  metFORMIN (GLUCOPHAGE) 1000 MG tablet Take 1,000 mg by mouth 2 (two) times daily.    [provider]  mometasone-formoterol (DULERA) 200-5 MCG/ACT AERO Inhale 2 puffs into the lungs 2 (two) times daily. 06/27/16   Milagros LollSudini, Srikar, MD  pravastatin (PRAVACHOL) 10 MG tablet Take 10 mg by mouth daily.    [provider]  predniSONE (DELTASONE) 10 MG tablet 50 mg daily for 4 more days. 09/01/16   Governor RooksLord, Rebecca, MD  predniSONE (DELTASONE) 20 MG tablet Take 3 tablets (60 mg total) by mouth daily. 01/06/17   Rebecka ApleyWebster, Allison P, MD  sitaGLIPtin (JANUVIA) 100 MG tablet Take 1 tablet by mouth daily.    [provider]  tiotropium (SPIRIVA) 18 MCG inhalation capsule Place 18 mcg into inhaler and inhale daily.    [provider]  VENTOLIN HFA 108 (90 Base) MCG/ACT inhaler Inhale 1  puff into the lungs every 6 (six) hours as needed. 06/25/16   [provider]    Allergies Metoprolol  Family History  Problem Relation Age of Onset  . Asthma Brother     Social History Social History  Substance Use Topics  . Smoking status: Never Smoker  . Smokeless tobacco: Current User    Types: Chew  . Alcohol use Yes     Comment: socially last drink 2 weeks ago    Review of Systems  Constitutional: No fever/chills Eyes: No visual changes. ENT: No sore throat. Cardiovascular: Denies chest pain. Respiratory:  shortness of  breath. Gastrointestinal: No abdominal pain.  No nausea, no vomiting.  No diarrhea.  No constipation. Genitourinary: Negative for dysuria. Musculoskeletal: Negative for back pain. Skin: Negative for rash. Neurological: Negative for headaches, focal weakness or numbness.   ____________________________________________   PHYSICAL EXAM:  VITAL SIGNS: ED Triage Vitals  Enc Vitals Group     BP 01/06/17 0116 (!) 156/85     Pulse Rate 01/06/17 0114 92     Resp 01/06/17 0114 (!) 32     Temp 01/06/17 0115 98.1 F (36.7 C)     Temp Source 01/06/17 0115 Oral     SpO2 01/06/17 0114 93 %     Weight 01/06/17 0111 170 lb (77.1 kg)     Height 01/06/17 0111 5\' 10"  (1.778 m)     Head Circumference --      Peak Flow --      Pain Score --      Pain Loc --      Pain Edu? --      Excl. in GC? --     Constitutional: Alert and oriented. Well appearing and in Moderate respiratory distress. Eyes: Conjunctivae are normal. PERRL. EOMI. Head: Atraumatic. Nose: No congestion/rhinnorhea. Mouth/Throat: Mucous membranes are moist.  Oropharynx non-erythematous. Cardiovascular: Normal rate, regular rhythm. Grossly normal heart sounds.  Good peripheral circulation. Respiratory: Increased respiratory effort. retractions. Expiratory wheezes throughout all lung fields Gastrointestinal: Soft and nontender. No distention. Positive bowel sounds Musculoskeletal: No lower extremity tenderness nor edema.  Neurologic:  Normal speech and language.  Skin:  Skin is warm, dry and intact.  Psychiatric: Mood and affect are normal.   ____________________________________________   LABS (all labs ordered are listed, but only abnormal results are displayed)  Labs Reviewed  CBC WITH DIFFERENTIAL/PLATELET - Abnormal; Notable for the following:       Result Value   Neutro Abs 7.3 (*)    All other components within normal limits  COMPREHENSIVE METABOLIC PANEL - Abnormal; Notable for the following:    Glucose, Bld 259  (*)    All other components within normal limits  TROPONIN I  TROPONIN I   ____________________________________________  EKG  ED ECG REPORT I, Rebecka Apley, the attending physician, personally viewed and interpreted this ECG.   Date: 01/06/2017  EKG Time: 208  Rate: 80  Rhythm: normal sinus rhythm  Axis: left axis deviation  Intervals:none  ST&T Change: normal  ____________________________________________  RADIOLOGY  CXR ____________________________________________   PROCEDURES  Procedure(s) performed: None  Procedures  Critical Care performed: No  ____________________________________________   INITIAL IMPRESSION / ASSESSMENT AND PLAN / ED COURSE  Pertinent labs & imaging results that were available during my care of the patient were reviewed by me and considered in my medical decision making (see chart for details).  This is an 83 year old who comes into the hospital today with some shortness of breath. The  patient has bronchiectasis which is likely the cause of the shortness of breath. The patient has wheezing throughout all lung fields AND some duo nebs, Solu-Medrol and some magnesium sulfate. I will then reassess the patient when she's received all of his meds. The patient's x-ray does not show any specific pneumonia. His blood work is also unremarkable.  Clinical Course as of Jan 07 852  Wynelle Link Jan 06, 2017  0157 Minimal bibasilar scarring noted.  Lungs otherwise clear. DG Chest 1 View [AW]    Clinical Course User Index [AW] Rebecka Apley, MD   The patient was sleeping with O2 sats in the 90s when I went into his room. He still had some mild wheezing so I gave him another albuterol neb. I will discharge the patient with some steroids and have her follow-up with his pulmonologist. His breathing he reports is much improved compared to previous. The patient be discharged home.  ____________________________________________   FINAL CLINICAL  IMPRESSION(S) / ED DIAGNOSES  Final diagnoses:  Shortness of breath  Wheezing      NEW MEDICATIONS STARTED DURING THIS VISIT:  Discharge Medication List as of 01/06/2017  6:17 AM    START taking these medications   Details  !! predniSONE (DELTASONE) 20 MG tablet Take 3 tablets (60 mg total) by mouth daily., Starting Sun 01/06/2017, Print     !! - Potential duplicate medications found. Please discuss with provider.       Note:  This document was prepared using Dragon voice recognition software and may include unintentional dictation errors.    Rebecka Apley, MD 01/06/17 [phone]

## 2017-01-06 NOTE — Discharge Instructions (Signed)
Please use her albuterol every 4-6 hours for the next 24 hours. Please follow up with your pulmonologist.

## 2017-01-08 DIAGNOSIS — J471 Bronchiectasis with (acute) exacerbation: Secondary | ICD-10-CM | POA: Diagnosis not present

## 2017-01-13 DIAGNOSIS — J4540 Moderate persistent asthma, uncomplicated: Secondary | ICD-10-CM | POA: Diagnosis not present

## 2017-01-13 DIAGNOSIS — J471 Bronchiectasis with (acute) exacerbation: Secondary | ICD-10-CM | POA: Diagnosis not present

## 2017-01-13 DIAGNOSIS — J439 Emphysema, unspecified: Secondary | ICD-10-CM | POA: Diagnosis not present

## 2017-02-07 DIAGNOSIS — Z0000 Encounter for general adult medical examination without abnormal findings: Secondary | ICD-10-CM | POA: Diagnosis not present

## 2017-02-13 DIAGNOSIS — J4540 Moderate persistent asthma, uncomplicated: Secondary | ICD-10-CM | POA: Diagnosis not present

## 2017-02-13 DIAGNOSIS — J439 Emphysema, unspecified: Secondary | ICD-10-CM | POA: Diagnosis not present

## 2017-02-13 DIAGNOSIS — J471 Bronchiectasis with (acute) exacerbation: Secondary | ICD-10-CM | POA: Diagnosis not present

## 2017-02-14 DIAGNOSIS — E538 Deficiency of other specified B group vitamins: Secondary | ICD-10-CM | POA: Diagnosis not present

## 2017-02-14 DIAGNOSIS — E119 Type 2 diabetes mellitus without complications: Secondary | ICD-10-CM | POA: Diagnosis not present

## 2017-02-14 DIAGNOSIS — E782 Mixed hyperlipidemia: Secondary | ICD-10-CM | POA: Diagnosis not present

## 2017-02-14 DIAGNOSIS — J479 Bronchiectasis, uncomplicated: Secondary | ICD-10-CM | POA: Diagnosis not present

## 2017-03-05 DIAGNOSIS — J471 Bronchiectasis with (acute) exacerbation: Secondary | ICD-10-CM | POA: Diagnosis not present

## 2017-03-15 DIAGNOSIS — J439 Emphysema, unspecified: Secondary | ICD-10-CM | POA: Diagnosis not present

## 2017-03-15 DIAGNOSIS — J471 Bronchiectasis with (acute) exacerbation: Secondary | ICD-10-CM | POA: Diagnosis not present

## 2017-03-15 DIAGNOSIS — J4540 Moderate persistent asthma, uncomplicated: Secondary | ICD-10-CM | POA: Diagnosis not present

## 2017-04-11 DIAGNOSIS — E119 Type 2 diabetes mellitus without complications: Secondary | ICD-10-CM | POA: Diagnosis not present

## 2017-04-11 DIAGNOSIS — E782 Mixed hyperlipidemia: Secondary | ICD-10-CM | POA: Diagnosis not present

## 2017-04-11 DIAGNOSIS — I10 Essential (primary) hypertension: Secondary | ICD-10-CM | POA: Diagnosis not present

## 2017-04-11 DIAGNOSIS — I2510 Atherosclerotic heart disease of native coronary artery without angina pectoris: Secondary | ICD-10-CM | POA: Diagnosis not present

## 2017-04-12 DIAGNOSIS — N401 Enlarged prostate with lower urinary tract symptoms: Secondary | ICD-10-CM | POA: Diagnosis not present

## 2017-04-12 DIAGNOSIS — R972 Elevated prostate specific antigen [PSA]: Secondary | ICD-10-CM | POA: Diagnosis not present

## 2017-04-12 DIAGNOSIS — R350 Frequency of micturition: Secondary | ICD-10-CM | POA: Diagnosis not present

## 2017-04-15 DIAGNOSIS — J471 Bronchiectasis with (acute) exacerbation: Secondary | ICD-10-CM | POA: Diagnosis not present

## 2017-04-15 DIAGNOSIS — J4540 Moderate persistent asthma, uncomplicated: Secondary | ICD-10-CM | POA: Diagnosis not present

## 2017-04-15 DIAGNOSIS — J439 Emphysema, unspecified: Secondary | ICD-10-CM | POA: Diagnosis not present

## 2017-05-13 DIAGNOSIS — H524 Presbyopia: Secondary | ICD-10-CM | POA: Diagnosis not present

## 2017-05-13 DIAGNOSIS — E119 Type 2 diabetes mellitus without complications: Secondary | ICD-10-CM | POA: Diagnosis not present

## 2017-05-16 DIAGNOSIS — J439 Emphysema, unspecified: Secondary | ICD-10-CM | POA: Diagnosis not present

## 2017-05-16 DIAGNOSIS — J4540 Moderate persistent asthma, uncomplicated: Secondary | ICD-10-CM | POA: Diagnosis not present

## 2017-05-16 DIAGNOSIS — J471 Bronchiectasis with (acute) exacerbation: Secondary | ICD-10-CM | POA: Diagnosis not present

## 2017-06-07 DIAGNOSIS — Z23 Encounter for immunization: Secondary | ICD-10-CM | POA: Diagnosis not present

## 2017-06-10 DIAGNOSIS — R918 Other nonspecific abnormal finding of lung field: Secondary | ICD-10-CM | POA: Diagnosis not present

## 2017-06-10 DIAGNOSIS — J471 Bronchiectasis with (acute) exacerbation: Secondary | ICD-10-CM | POA: Diagnosis not present

## 2017-06-11 DIAGNOSIS — J439 Emphysema, unspecified: Secondary | ICD-10-CM | POA: Diagnosis not present

## 2017-06-11 DIAGNOSIS — J4540 Moderate persistent asthma, uncomplicated: Secondary | ICD-10-CM | POA: Diagnosis not present

## 2017-06-11 DIAGNOSIS — J479 Bronchiectasis, uncomplicated: Secondary | ICD-10-CM | POA: Diagnosis not present

## 2017-06-11 DIAGNOSIS — J471 Bronchiectasis with (acute) exacerbation: Secondary | ICD-10-CM | POA: Diagnosis not present

## 2017-06-11 DIAGNOSIS — J411 Mucopurulent chronic bronchitis: Secondary | ICD-10-CM | POA: Diagnosis not present

## 2017-06-12 DIAGNOSIS — E119 Type 2 diabetes mellitus without complications: Secondary | ICD-10-CM | POA: Diagnosis not present

## 2017-06-12 DIAGNOSIS — E538 Deficiency of other specified B group vitamins: Secondary | ICD-10-CM | POA: Diagnosis not present

## 2017-06-12 DIAGNOSIS — E782 Mixed hyperlipidemia: Secondary | ICD-10-CM | POA: Diagnosis not present

## 2017-06-15 DIAGNOSIS — J471 Bronchiectasis with (acute) exacerbation: Secondary | ICD-10-CM | POA: Diagnosis not present

## 2017-06-15 DIAGNOSIS — J4540 Moderate persistent asthma, uncomplicated: Secondary | ICD-10-CM | POA: Diagnosis not present

## 2017-06-15 DIAGNOSIS — J439 Emphysema, unspecified: Secondary | ICD-10-CM | POA: Diagnosis not present

## 2017-06-18 DIAGNOSIS — E1165 Type 2 diabetes mellitus with hyperglycemia: Secondary | ICD-10-CM | POA: Diagnosis not present

## 2017-06-18 DIAGNOSIS — E538 Deficiency of other specified B group vitamins: Secondary | ICD-10-CM | POA: Diagnosis not present

## 2017-06-18 DIAGNOSIS — Z125 Encounter for screening for malignant neoplasm of prostate: Secondary | ICD-10-CM | POA: Diagnosis not present

## 2017-06-18 DIAGNOSIS — E782 Mixed hyperlipidemia: Secondary | ICD-10-CM | POA: Diagnosis not present

## 2017-06-27 DIAGNOSIS — E109 Type 1 diabetes mellitus without complications: Secondary | ICD-10-CM | POA: Diagnosis not present

## 2017-07-03 DIAGNOSIS — Z23 Encounter for immunization: Secondary | ICD-10-CM | POA: Diagnosis not present

## 2017-07-15 DIAGNOSIS — E118 Type 2 diabetes mellitus with unspecified complications: Secondary | ICD-10-CM | POA: Diagnosis not present

## 2017-07-15 DIAGNOSIS — E0965 Drug or chemical induced diabetes mellitus with hyperglycemia: Secondary | ICD-10-CM | POA: Diagnosis not present

## 2017-07-15 DIAGNOSIS — T380X5A Adverse effect of glucocorticoids and synthetic analogues, initial encounter: Secondary | ICD-10-CM | POA: Diagnosis not present

## 2017-07-16 DIAGNOSIS — J439 Emphysema, unspecified: Secondary | ICD-10-CM | POA: Diagnosis not present

## 2017-07-16 DIAGNOSIS — J4540 Moderate persistent asthma, uncomplicated: Secondary | ICD-10-CM | POA: Diagnosis not present

## 2017-07-16 DIAGNOSIS — J471 Bronchiectasis with (acute) exacerbation: Secondary | ICD-10-CM | POA: Diagnosis not present

## 2017-07-19 DIAGNOSIS — H10023 Other mucopurulent conjunctivitis, bilateral: Secondary | ICD-10-CM | POA: Diagnosis not present

## 2017-07-23 DIAGNOSIS — J411 Mucopurulent chronic bronchitis: Secondary | ICD-10-CM | POA: Diagnosis not present

## 2017-07-23 DIAGNOSIS — J4540 Moderate persistent asthma, uncomplicated: Secondary | ICD-10-CM | POA: Diagnosis not present

## 2017-08-15 DIAGNOSIS — J4540 Moderate persistent asthma, uncomplicated: Secondary | ICD-10-CM | POA: Diagnosis not present

## 2017-08-15 DIAGNOSIS — J439 Emphysema, unspecified: Secondary | ICD-10-CM | POA: Diagnosis not present

## 2017-08-15 DIAGNOSIS — J471 Bronchiectasis with (acute) exacerbation: Secondary | ICD-10-CM | POA: Diagnosis not present

## 2017-09-09 DIAGNOSIS — H2513 Age-related nuclear cataract, bilateral: Secondary | ICD-10-CM | POA: Diagnosis not present

## 2017-09-15 DIAGNOSIS — J4540 Moderate persistent asthma, uncomplicated: Secondary | ICD-10-CM | POA: Diagnosis not present

## 2017-09-15 DIAGNOSIS — J471 Bronchiectasis with (acute) exacerbation: Secondary | ICD-10-CM | POA: Diagnosis not present

## 2017-09-15 DIAGNOSIS — J439 Emphysema, unspecified: Secondary | ICD-10-CM | POA: Diagnosis not present

## 2017-09-24 DIAGNOSIS — J4540 Moderate persistent asthma, uncomplicated: Secondary | ICD-10-CM | POA: Diagnosis not present

## 2017-09-24 DIAGNOSIS — J411 Mucopurulent chronic bronchitis: Secondary | ICD-10-CM | POA: Diagnosis not present

## 2017-10-10 DIAGNOSIS — H52222 Regular astigmatism, left eye: Secondary | ICD-10-CM | POA: Diagnosis not present

## 2017-10-10 DIAGNOSIS — H25812 Combined forms of age-related cataract, left eye: Secondary | ICD-10-CM | POA: Diagnosis not present

## 2017-10-12 ENCOUNTER — Encounter: Payer: Self-pay | Admitting: Emergency Medicine

## 2017-10-12 ENCOUNTER — Inpatient Hospital Stay
Admission: EM | Admit: 2017-10-12 | Discharge: 2017-10-14 | DRG: 871 | Disposition: A | Discharge status: Home / Self Care | Payer: Medicare Other | Attending: Internal Medicine | Admitting: Internal Medicine

## 2017-10-12 ENCOUNTER — Emergency Department: Payer: Medicare Other

## 2017-10-12 ENCOUNTER — Other Ambulatory Visit: Payer: Self-pay

## 2017-10-12 DIAGNOSIS — F17220 Nicotine dependence, chewing tobacco, uncomplicated: Secondary | ICD-10-CM | POA: Diagnosis not present

## 2017-10-12 DIAGNOSIS — I248 Other forms of acute ischemic heart disease: Secondary | ICD-10-CM | POA: Diagnosis present

## 2017-10-12 DIAGNOSIS — J45901 Unspecified asthma with (acute) exacerbation: Secondary | ICD-10-CM | POA: Diagnosis not present

## 2017-10-12 DIAGNOSIS — Z7952 Long term (current) use of systemic steroids: Secondary | ICD-10-CM | POA: Diagnosis not present

## 2017-10-12 DIAGNOSIS — J181 Lobar pneumonia, unspecified organism: Secondary | ICD-10-CM | POA: Diagnosis not present

## 2017-10-12 DIAGNOSIS — R0602 Shortness of breath: Secondary | ICD-10-CM | POA: Diagnosis not present

## 2017-10-12 DIAGNOSIS — Z9981 Dependence on supplemental oxygen: Secondary | ICD-10-CM

## 2017-10-12 DIAGNOSIS — Z7984 Long term (current) use of oral hypoglycemic drugs: Secondary | ICD-10-CM

## 2017-10-12 DIAGNOSIS — J441 Chronic obstructive pulmonary disease with (acute) exacerbation: Secondary | ICD-10-CM

## 2017-10-12 DIAGNOSIS — Z9049 Acquired absence of other specified parts of digestive tract: Secondary | ICD-10-CM | POA: Diagnosis not present

## 2017-10-12 DIAGNOSIS — E1165 Type 2 diabetes mellitus with hyperglycemia: Secondary | ICD-10-CM | POA: Diagnosis present

## 2017-10-12 DIAGNOSIS — Z8249 Family history of ischemic heart disease and other diseases of the circulatory system: Secondary | ICD-10-CM

## 2017-10-12 DIAGNOSIS — J189 Pneumonia, unspecified organism: Secondary | ICD-10-CM | POA: Diagnosis present

## 2017-10-12 DIAGNOSIS — I10 Essential (primary) hypertension: Secondary | ICD-10-CM | POA: Diagnosis not present

## 2017-10-12 DIAGNOSIS — Z7982 Long term (current) use of aspirin: Secondary | ICD-10-CM | POA: Diagnosis not present

## 2017-10-12 DIAGNOSIS — T380X5A Adverse effect of glucocorticoids and synthetic analogues, initial encounter: Secondary | ICD-10-CM | POA: Diagnosis present

## 2017-10-12 DIAGNOSIS — Z7951 Long term (current) use of inhaled steroids: Secondary | ICD-10-CM | POA: Diagnosis not present

## 2017-10-12 DIAGNOSIS — J440 Chronic obstructive pulmonary disease with acute lower respiratory infection: Secondary | ICD-10-CM | POA: Diagnosis present

## 2017-10-12 DIAGNOSIS — E119 Type 2 diabetes mellitus without complications: Secondary | ICD-10-CM | POA: Diagnosis not present

## 2017-10-12 DIAGNOSIS — Z955 Presence of coronary angioplasty implant and graft: Secondary | ICD-10-CM

## 2017-10-12 DIAGNOSIS — J9601 Acute respiratory failure with hypoxia: Secondary | ICD-10-CM | POA: Diagnosis not present

## 2017-10-12 DIAGNOSIS — Z79899 Other long term (current) drug therapy: Secondary | ICD-10-CM

## 2017-10-12 DIAGNOSIS — J9621 Acute and chronic respiratory failure with hypoxia: Secondary | ICD-10-CM | POA: Diagnosis present

## 2017-10-12 DIAGNOSIS — A419 Sepsis, unspecified organism: Secondary | ICD-10-CM

## 2017-10-12 DIAGNOSIS — J471 Bronchiectasis with (acute) exacerbation: Secondary | ICD-10-CM

## 2017-10-12 DIAGNOSIS — J168 Pneumonia due to other specified infectious organisms: Secondary | ICD-10-CM | POA: Diagnosis not present

## 2017-10-12 HISTORY — DX: Chronic obstructive pulmonary disease, unspecified: J44.9

## 2017-10-12 LAB — CBC
HCT: 37.6 % — ABNORMAL LOW (ref 40.0–52.0)
HEMOGLOBIN: 12.6 g/dL — AB (ref 13.0–18.0)
MCH: 31.8 pg (ref 26.0–34.0)
MCHC: 33.7 g/dL (ref 32.0–36.0)
MCV: 94.5 fL (ref 80.0–100.0)
PLATELETS: 264 10*3/uL (ref 150–440)
RBC: 3.98 MIL/uL — AB (ref 4.40–5.90)
RDW: 13.6 % (ref 11.5–14.5)
WBC: 10.3 10*3/uL (ref 3.8–10.6)

## 2017-10-12 LAB — GLUCOSE, CAPILLARY: GLUCOSE-CAPILLARY: 224 mg/dL — AB (ref 65–99)

## 2017-10-12 LAB — BASIC METABOLIC PANEL
ANION GAP: 10 (ref 5–15)
BUN: 17 mg/dL (ref 6–20)
CALCIUM: 8.3 mg/dL — AB (ref 8.9–10.3)
CHLORIDE: 98 mmol/L — AB (ref 101–111)
CO2: 26 mmol/L (ref 22–32)
Creatinine, Ser: 1.02 mg/dL (ref 0.61–1.24)
GFR calc non Af Amer: >60 mL/min (ref >60)
Glucose, Bld: 268 mg/dL — ABNORMAL HIGH (ref 65–99)
Potassium: 4 mmol/L (ref 3.5–5.1)
SODIUM: 134 mmol/L — AB (ref 135–145)

## 2017-10-12 LAB — TROPONIN I
TROPONIN I: 0.07 ng/mL — AB (ref <0.03)
Troponin I: 0.05 ng/mL (ref <0.03)

## 2017-10-12 LAB — LACTIC ACID, PLASMA: LACTIC ACID, VENOUS: 1.8 mmol/L (ref 0.5–1.9)

## 2017-10-12 LAB — INFLUENZA PANEL BY PCR (TYPE A & B)
INFLAPCR: NEGATIVE
INFLBPCR: NEGATIVE

## 2017-10-12 MED ORDER — ASPIRIN EC 81 MG PO TBEC
81.0000 mg | DELAYED_RELEASE_TABLET | Freq: Every day | ORAL | Status: DC
  Administered 2017-10-13 – 2017-10-14 (×2): 81 mg via ORAL
  Filled 2017-10-12 (×2): qty 1

## 2017-10-12 MED ORDER — VANCOMYCIN HCL IN DEXTROSE 1-5 GM/200ML-% IV SOLN
1000.0000 mg | Freq: Once | INTRAVENOUS | Status: DC
  Filled 2017-10-12: qty 200

## 2017-10-12 MED ORDER — LINAGLIPTIN 5 MG PO TABS
5.0000 mg | ORAL_TABLET | Freq: Every day | ORAL | Status: DC
  Administered 2017-10-13 – 2017-10-14 (×2): 5 mg via ORAL
  Filled 2017-10-12 (×3): qty 1

## 2017-10-12 MED ORDER — TROLAMINE SALICYLATE 10 % EX CREA
1.0000 "application " | TOPICAL_CREAM | CUTANEOUS | Status: DC | PRN
  Administered 2017-10-12 – 2017-10-14 (×2): 1 via TOPICAL
  Filled 2017-10-12: qty 85

## 2017-10-12 MED ORDER — INSULIN ASPART 100 UNIT/ML ~~LOC~~ SOLN
0.0000 [IU] | Freq: Three times a day (TID) | SUBCUTANEOUS | Status: DC
  Administered 2017-10-13: 15 [IU] via SUBCUTANEOUS
  Administered 2017-10-13: 11 [IU] via SUBCUTANEOUS
  Administered 2017-10-14: 3 [IU] via SUBCUTANEOUS
  Filled 2017-10-12 (×4): qty 1

## 2017-10-12 MED ORDER — ENOXAPARIN SODIUM 40 MG/0.4ML ~~LOC~~ SOLN
40.0000 mg | SUBCUTANEOUS | Status: DC
  Administered 2017-10-12 – 2017-10-13 (×2): 40 mg via SUBCUTANEOUS
  Filled 2017-10-12 (×2): qty 0.4

## 2017-10-12 MED ORDER — HYDRALAZINE HCL 20 MG/ML IJ SOLN: 10.0000 mg | INTRAMUSCULAR | Status: DC | PRN

## 2017-10-12 MED ORDER — IPRATROPIUM-ALBUTEROL 0.5-2.5 (3) MG/3ML IN SOLN
3.0000 mL | Freq: Once | RESPIRATORY_TRACT | Status: AC
  Administered 2017-10-12: 3 mL via RESPIRATORY_TRACT
  Filled 2017-10-12: qty 3

## 2017-10-12 MED ORDER — PIPERACILLIN-TAZOBACTAM 3.375 G IVPB 30 MIN
3.3750 g | Freq: Once | INTRAVENOUS | Status: AC
  Administered 2017-10-12: 3.375 g via INTRAVENOUS
  Filled 2017-10-12: qty 50

## 2017-10-12 MED ORDER — VANCOMYCIN HCL 10 G IV SOLR
1250.0000 mg | Freq: Once | INTRAVENOUS | Status: AC
  Administered 2017-10-12: 1250 mg via INTRAVENOUS
  Filled 2017-10-12: qty 1250

## 2017-10-12 MED ORDER — VANCOMYCIN HCL 10 G IV SOLR
1250.0000 mg | INTRAVENOUS | Status: DC
  Administered 2017-10-13 – 2017-10-14 (×2): 1250 mg via INTRAVENOUS
  Filled 2017-10-12 (×3): qty 1250

## 2017-10-12 MED ORDER — METFORMIN HCL 500 MG PO TABS
1000.0000 mg | ORAL_TABLET | Freq: Two times a day (BID) | ORAL | Status: DC
  Administered 2017-10-14: 1000 mg via ORAL
  Filled 2017-10-12 (×3): qty 2

## 2017-10-12 MED ORDER — IPRATROPIUM-ALBUTEROL 0.5-2.5 (3) MG/3ML IN SOLN
3.0000 mL | Freq: Four times a day (QID) | RESPIRATORY_TRACT | Status: DC
  Administered 2017-10-13: 3 mL via RESPIRATORY_TRACT
  Filled 2017-10-12: qty 3

## 2017-10-12 MED ORDER — GLIMEPIRIDE 2 MG PO TABS
4.0000 mg | ORAL_TABLET | Freq: Two times a day (BID) | ORAL | Status: DC
  Administered 2017-10-13 – 2017-10-14 (×3): 4 mg via ORAL
  Filled 2017-10-12: qty 1
  Filled 2017-10-12 (×3): qty 2

## 2017-10-12 MED ORDER — PRAVASTATIN SODIUM 20 MG PO TABS
10.0000 mg | ORAL_TABLET | Freq: Every day | ORAL | Status: DC
  Administered 2017-10-13 – 2017-10-14 (×2): 10 mg via ORAL
  Filled 2017-10-12 (×2): qty 1

## 2017-10-12 MED ORDER — DILTIAZEM HCL ER COATED BEADS 120 MG PO CP24
120.0000 mg | ORAL_CAPSULE | Freq: Every day | ORAL | Status: DC
  Administered 2017-10-13 – 2017-10-14 (×2): 120 mg via ORAL
  Filled 2017-10-12 (×3): qty 1

## 2017-10-12 MED ORDER — GUAIFENESIN ER 600 MG PO TB12
600.0000 mg | ORAL_TABLET | Freq: Two times a day (BID) | ORAL | Status: DC
  Administered 2017-10-12 – 2017-10-14 (×4): 600 mg via ORAL
  Filled 2017-10-12 (×4): qty 1

## 2017-10-12 MED ORDER — SODIUM CHLORIDE 0.9 % IV BOLUS (SEPSIS)
500.0000 mL | Freq: Once | INTRAVENOUS | Status: AC
  Administered 2017-10-12: 500 mL via INTRAVENOUS

## 2017-10-12 MED ORDER — INSULIN ASPART 100 UNIT/ML ~~LOC~~ SOLN
0.0000 [IU] | Freq: Every day | SUBCUTANEOUS | Status: DC
  Administered 2017-10-12: 2 [IU] via SUBCUTANEOUS
  Filled 2017-10-12: qty 1

## 2017-10-12 MED ORDER — METHYLPREDNISOLONE SODIUM SUCC 125 MG IJ SOLR
125.0000 mg | INTRAMUSCULAR | Status: AC
  Administered 2017-10-12: 125 mg via INTRAVENOUS
  Filled 2017-10-12: qty 2

## 2017-10-12 MED ORDER — MOMETASONE FURO-FORMOTEROL FUM 200-5 MCG/ACT IN AERO
2.0000 | INHALATION_SPRAY | Freq: Two times a day (BID) | RESPIRATORY_TRACT | Status: DC
  Administered 2017-10-12 – 2017-10-14 (×4): 2 via RESPIRATORY_TRACT
  Filled 2017-10-12: qty 8.8

## 2017-10-12 MED ORDER — VITAMIN D 1000 UNITS PO TABS: 1000.0000 [IU] | ORAL_TABLET | ORAL | Status: DC

## 2017-10-12 MED ORDER — PREDNISONE 50 MG PO TABS
50.0000 mg | ORAL_TABLET | Freq: Every day | ORAL | Status: DC
  Filled 2017-10-12: qty 1

## 2017-10-12 MED ORDER — ASPIRIN 81 MG PO CHEW
324.0000 mg | CHEWABLE_TABLET | Freq: Once | ORAL | Status: AC
  Administered 2017-10-12: 324 mg via ORAL
  Filled 2017-10-12: qty 4

## 2017-10-12 MED ORDER — PIPERACILLIN-TAZOBACTAM 3.375 G IVPB
3.3750 g | Freq: Three times a day (TID) | INTRAVENOUS | Status: DC
  Administered 2017-10-13 – 2017-10-14 (×4): 3.375 g via INTRAVENOUS
  Filled 2017-10-12 (×5): qty 50

## 2017-10-12 NOTE — Progress Notes (Addendum)
ANTIBIOTIC CONSULT NOTE - INITIAL  Pharmacy Consult for Vancomycin, Zosyn  Indication: pneumonia  Allergies  Allergen Reactions  . Metoprolol     Pt states syncope with lopressor    Patient Measurements: Height: 5\' 10"  (177.8 cm) Weight: 170 lb (77.1 kg) IBW/kg (Calculated) : 73 Adjusted Body Weight:  74.6 kg   Vital Signs: Temp: 98.9 F (37.2 C) (02/16 1413) Temp Source: Oral (02/16 1413) BP: 132/69 (02/16 1413) Pulse Rate: 104 (02/16 1413) Intake/Output from previous day: No intake/output data recorded. Intake/Output from this shift: No intake/output data recorded.  Labs: Recent Labs    10/12/17 1417  WBC 10.3  HGB 12.6*  PLT 264  CREATININE 1.02   Estimated Creatinine Clearance: 55.7 mL/min (by C-G formula based on SCr of 1.02 mg/dL). No results for input(s): VANCOTROUGH, VANCOPEAK, VANCORANDOM, GENTTROUGH, GENTPEAK, GENTRANDOM, TOBRATROUGH, TOBRAPEAK, TOBRARND, AMIKACINPEAK, AMIKACINTROU, AMIKACIN in the last 72 hours.   Microbiology: No results found for this or any previous visit (from the past 720 hour(s)).  Medical History: Past Medical History:  Diagnosis Date  . Asthma   . Bronchiectasis (HCC)   . COPD (chronic obstructive pulmonary disease) (HCC)   . Diabetes mellitus     Medications:   (Not in a hospital admission) Assessment: CrCl = 55.7 ml/min  Ke = 0.051 T1/2 = 13.6 hrs Vd = 54 L   Goal of Therapy:  Vancomycin trough level 15-20 mcg/ml  Plan:  Expected duration 7 days with resolution of temperature and/or normalization of WBC   Zosyn 3.375 gm IV X 1 ordered to begin on 2/16 @ 22:00  Vancomycin 1250 mg IV X 1 to be given on 2/16 @ 2100. Vancomycin 1250 mg IV Q18H ordered to start on 2/17 @ 0300, ~ 6 hrs after 1st dose (stacked dosing).  This pt will reach Css by 2/19 @ 21:00. Will draw 1st trough on 2/20 @ 02:30, which will be at Css.   George Hensley D 10/12/2017,6:20 PM

## 2017-10-12 NOTE — ED Notes (Signed)
1A alerted patient was on his way up.

## 2017-10-12 NOTE — H&P (Signed)
[employer] [facility] [place] [facility]   PATIENT NAME: George Hensley    MR#:  [id number]  DATE OF BIRTH:  1931/05/26  DATE OF ADMISSION:  10/12/2017  PRIMARY CARE PHYSICIAN: Danella Penton, MD   REQUESTING/REFERRING PHYSICIAN:   CHIEF COMPLAINT:   Chief Complaint  Patient presents with  . Shortness of Breath    HISTORY OF PRESENT ILLNESS: George Hensley  is a 84 y.o. male with a known history per below which also includes chronic bronchiectasis-on chronic Cipro/history of Pseudomonas infection, last admitted for acute pneumonia, history of chronic hypoxic respiratory failure on 2 L at home, resents with 3-day history of worsening shortness of breath, dyspnea on exertion, 1 week history of productive cough, noted hypoxia in the emergency room, tachycardia with tachypnea, troponin 0.07, glucose 268, chest x-ray noted for left lower lobe pneumonia, hospitalist asked to evaluate/treat, patient evaluated emergency room, wife at the bedside, patient appears diaphoretic on presentation, patient now being admitted for acute complicated community-acquired left-sided pneumonia.  PAST MEDICAL HISTORY:   Past Medical History:  Diagnosis Date  . Asthma   . Bronchiectasis (HCC)   . COPD (chronic obstructive pulmonary disease) (HCC)   . Diabetes mellitus     PAST SURGICAL HISTORY:  Past Surgical History:  Procedure Laterality Date  . CHOLECYSTECTOMY  2000  . CORONARY STENT PLACEMENT  1999    SOCIAL HISTORY:  Social History   Tobacco Use  . Smoking status: Never Smoker  . Smokeless tobacco: Current User    Types: Chew  Substance Use Topics  . Alcohol use: Yes    Comment: socially last drink 2 weeks ago    FAMILY HISTORY:  Family History  Problem Relation Age of Onset  . Asthma Brother   HTN  DRUG ALLERGIES:  Allergies  Allergen Reactions  . Metoprolol     Pt states syncope with lopressor    REVIEW OF SYSTEMS:   CONSTITUTIONAL: + fever, fatigue, gen.  weakness.  EYES: No blurred or double vision.  EARS, NOSE, AND THROAT: No tinnitus or ear pain.  RESPIRATORY: + cough, shortness of breath, wheezing, no or hemoptysis.  CARDIOVASCULAR: No chest pain, orthopnea, edema.  GASTROINTESTINAL: No nausea, vomiting, diarrhea or abdominal pain.  GENITOURINARY: No dysuria, hematuria.  ENDOCRINE: No polyuria, nocturia,  HEMATOLOGY: No anemia, easy bruising or bleeding SKIN: No rash or lesion. MUSCULOSKELETAL: No joint pain or arthritis.   NEUROLOGIC: No tingling, numbness, weakness.  PSYCHIATRY: No anxiety or depression.   MEDICATIONS AT HOME:  Prior to Admission medications   Medication Sig Start Date End Date Taking? Authorizing Provider  aspirin EC 81 MG tablet Take 81 mg by mouth daily.  09/22/12   [provider]  Cholecalciferol (VITAMIN D-1000 MAX ST) 1000 units tablet Take 1 tablet by mouth 3 (three) times a week.    [provider]  ciprofloxacin (CIPRO) 500 MG tablet Take 1 tablet (500 mg total) by mouth 2 (two) times daily. 06/27/16   Milagros Loll, MD  diltiazem (CARDIZEM LA) 120 MG 24 hr tablet Take 120 mg by mouth daily.    [provider]  glimepiride (AMARYL) 4 MG tablet Take 4 mg by mouth 2 (two) times daily.    [provider]  metFORMIN (GLUCOPHAGE) 1000 MG tablet Take 1,000 mg by mouth 2 (two) times daily.    [provider]  mometasone-formoterol (DULERA) 200-5 MCG/ACT AERO Inhale 2 puffs into the lungs 2 (two) times daily. 06/27/16   Milagros Loll, MD  pravastatin (PRAVACHOL) 10 MG tablet Take 10 mg by mouth daily.    [provider]  predniSONE (DELTASONE) 10 MG tablet 50 mg daily for 4 more days. 09/01/16   Governor RooksLord, Rebecca, MD  predniSONE (DELTASONE) 20 MG tablet Take 3 tablets (60 mg total) by mouth daily. 01/06/17   Rebecka ApleyWebster, Allison P, MD  sitaGLIPtin (JANUVIA) 100 MG tablet Take 1 tablet by mouth daily.    [provider]  tiotropium (SPIRIVA) 18 MCG inhalation  capsule Place 18 mcg into inhaler and inhale daily.    [provider]  VENTOLIN HFA 108 (90 Base) MCG/ACT inhaler Inhale 1 puff into the lungs every 6 (six) hours as needed. 06/25/16   [provider]      PHYSICAL EXAMINATION:   VITAL SIGNS: Blood pressure 132/69, pulse (!) 104, temperature 98.9 F (37.2 C), temperature source Oral, resp. rate (!) 22, weight 77.1 kg (170 lb), SpO2 90 %.  GENERAL:  84 y.o.-year-old patient lying in the bed with no acute distress.  EYES: Pupils equal, round, reactive to light and accommodation. No scleral icterus. Extraocular muscles intact.  HEENT: Head atraumatic, normocephalic. Oropharynx and nasopharynx clear.  NECK:  Supple, no jugular venous distention. No thyroid enlargement, no tenderness.  LUNGS: Coarse breath sounds bilaterally. No use of accessory muscles of respiration.  CARDIOVASCULAR: S1, S2 normal. No murmurs, rubs, or gallops.  ABDOMEN: Soft, nontender, nondistended. Bowel sounds present. No organomegaly or mass.  EXTREMITIES: No pedal edema, cyanosis, or clubbing.  NEUROLOGIC: Cranial nerves II through XII are intact. MAES. Gait not checked.  PSYCHIATRIC: The patient is alert and oriented x 3.  SKIN: No obvious rash, lesion, or ulcer.   LABORATORY PANEL:   CBC Recent Labs  Lab 10/12/17 1417  WBC 10.3  HGB 12.6*  HCT 37.6*  PLT 264  MCV 94.5  MCH 31.8  MCHC 33.7  RDW 13.6   ------------------------------------------------------------------------------------------------------------------  Chemistries  Recent Labs  Lab 10/12/17 1417  NA 134*  K 4.0  CL 98*  CO2 26  GLUCOSE 268*  BUN 17  CREATININE 1.02  CALCIUM 8.3*   ------------------------------------------------------------------------------------------------------------------ CrCl cannot be calculated (Unknown ideal weight.). ------------------------------------------------------------------------------------------------------------------ No  results for input(s): TSH, T4TOTAL, T3FREE, THYROIDAB in the last 72 hours.  Invalid input(s): FREET3   Coagulation profile No results for input(s): INR, PROTIME in the last 168 hours. ------------------------------------------------------------------------------------------------------------------- No results for input(s): DDIMER in the last 72 hours. -------------------------------------------------------------------------------------------------------------------  Cardiac Enzymes Recent Labs  Lab 10/12/17 1417  TROPONINI 0.07*   ------------------------------------------------------------------------------------------------------------------ Invalid input(s): POCBNP  ---------------------------------------------------------------------------------------------------------------  Urinalysis    Component Value Date/Time   COLORURINE Yellow 05/16/2014 0851   APPEARANCEUR Cloudy 05/16/2014 0851   LABSPEC 1.020 05/16/2014 0851   PHURINE 5.0 05/16/2014 0851   GLUCOSEU 150 mg/dL 16/10/960409/20/2015 54090851   HGBUR 1+ 05/16/2014 0851   BILIRUBINUR Negative 05/16/2014 0851   KETONESUR 1+ 05/16/2014 0851   PROTEINUR 30 mg/dL 81/19/147809/20/2015 [id number]   NITRITE Positive 05/16/2014 0851   LEUKOCYTESUR 3+ 05/16/2014 0851     RADIOLOGY: Dg Chest 2 View  Result Date: 10/12/2017 CLINICAL DATA:  Increasing shortness of breath over the past 6 weeks. EXAM: CHEST  2 VIEW COMPARISON:  Chest x-ray dated Jan 06, 2017. FINDINGS: The heart size and mediastinal contours are within normal limits. Normal pulmonary vascularity. The lungs are hyperinflated. Patchy consolidation in the posterior left lower lobe. No pleural effusion or pneumothorax. No acute osseous abnormality. IMPRESSION: 1. Left lower lobe pneumonia. Followup PA and lateral chest X-ray is recommended in  3-4 weeks following trial of antibiotic therapy to ensure resolution and exclude underlying malignancy. Electronically Signed   By: Obie Dredge M.D.    On: 10/12/2017 15:05    EKG: Orders placed or performed during the hospital encounter of 10/12/17  . ED EKG  . ED EKG  . ED EKG 12-Lead  . ED EKG 12-Lead    IMPRESSION AND PLAN: 1 acute on chronic hypoxic respiratory failure Secondary to complicated left lower lobe community acquired pneumonia with history of bronchiectasis on chronic Cipro/steroids Admit to regular nursing floor bed, supplemental oxygen as needed, breathing treatments as needed, respiratory therapy to see On 2 L via nasal cannula continuous at home  2 acute complicated left lower lobe community-acquired pneumonia Compounded by chronic bronchiectasis on chronic steroids/Cipro Case discussed between ED attending pulmonology/Dr. Kohn-recommended vancomycin/Zosyn empirically, start pneumonia protocol, follow-up on cultures, continue close medical monitoring  3 chronic diabetes mellitus type 2 Continue home regiment, sliding scale insulin with Accu-Cheks per routine  4 acute asthmatic, acute on chronic obstructive pulmonary disease exacerbation Secondary to bronchiectasis/pneumonia Prednisone with tapering as tolerated, will hold off on IV steroids at this time, scheduled breathing treatments, respiratory therapy to see, mucolytic agents, supplemental oxygen with tapering as tolerated  5 chronic benign essential hypertension Stable Continue home regiment, PRN hydralazine for systolic blood pressure greater than 160, vitals per routine, make changes as per necessary  Condition stable Prognosis fair Disposition home in 2-3 days barring any complications   All the records are reviewed and case discussed with ED provider. Management plans discussed with the patient, family and they are in agreement.  CODE STATUS: Code Status History    Date Active Date Inactive Code Status Order ID Comments User Context   06/26/2016 07:46 06/27/2016 20:41 Full Code [id number]  Ihor Austin, MD Inpatient   06/12/2016 09:41  06/13/2016 15:07 Full Code [id number]  Adrian Saran, MD Inpatient   08/02/2015 08:19 08/03/2015 14:30 Full Code [id number]  Arnaldo Natal, MD Inpatient       TOTAL TIME TAKING CARE OF THIS PATIENT: 45 minutes.    Evelena Asa Salary M.D on 10/12/2017   Between 7am to 6pm - Pager - [phone]  After 6pm go to [email or web address] - password [email or web address]  [facility]  Office  [phone]  CC: Primary care physician; Danella Penton, MD   Note: This dictation was prepared with Dragon dictation along with smaller phrase technology. Any transcriptional errors that result from this process are unintentional.

## 2017-10-12 NOTE — ED Provider Notes (Addendum)
[facility] Emergency Department Provider Note ____________________________________________   First MD Initiated Contact with Patient 10/12/17 1701     (approximate)  I have reviewed the triage vital signs and the nursing notes.   HISTORY  Chief Complaint Shortness of Breath    HPI George Hensley is a 84 y.o. male for evaluation of 1 week of shortness of breath and cough  Patient reports a long history of bronchiectasis, and previous MRSA as well as pseudomonal patient began having increasing cough and sputum production, using nebulizers at home without relief of shortness of breath.  Reports same in the past many at times requiring hospitalization.  Reports shortness of breath worse with any exertion.  Frequent productive yellow sputum.  No chest pain.  No leg swelling.  No abdominal pain.  No fever.  Does report he had a flu shot this year.  Also noticed his right eye has been red for a few days, thought due to coughing   Past Medical History:  Diagnosis Date  . Asthma   . Bronchiectasis (HCC)   . COPD (chronic obstructive pulmonary disease) (HCC)   . Diabetes mellitus     Patient Active Problem List   Diagnosis Date Noted  . CAP (community acquired pneumonia) 10/12/2017  . Dyspnea 06/26/2016  . Asthma exacerbation 06/26/2016  . Hypoxia 06/12/2016  . Acute on chronic respiratory failure with hypoxia (HCC) 08/02/2015  . Asthma 04/04/2011  . Pulmonary nodule 03/06/2011    Past Surgical History:  Procedure Laterality Date  . CHOLECYSTECTOMY  2000  . CORONARY STENT PLACEMENT  1999    Prior to Admission medications   Medication Sig Start Date End Date Taking? Authorizing Provider  aspirin EC 81 MG tablet Take 81 mg by mouth daily.  09/22/12  Yes [provider]  azithromycin (ZITHROMAX) 250 MG tablet Take 250-500 mg by mouth daily. Take 500 mg by mouth on day 1. Take 250 mg by mouth daily for 4 days. 10/10/17 10/14/17 Yes [provider]  Cholecalciferol (VITAMIN D-1000 MAX ST) 1000 units tablet Take 1 tablet by mouth 3 (three) times a week.   Yes [provider]  glimepiride (AMARYL) 4 MG tablet Take 4 mg by mouth 2 (two) times daily.   Yes [provider]  ketorolac (ACULAR) 0.5 % ophthalmic solution Place 1 drop into the left eye 3 (three) times daily. 10/05/17  Yes [provider]  metFORMIN (GLUCOPHAGE) 1000 MG tablet Take 1,000 mg by mouth 2 (two) times daily.   Yes [provider]  mometasone-formoterol (DULERA) 200-5 MCG/ACT AERO Inhale 2 puffs into the lungs 2 (two) times daily. 06/27/16  Yes Sudini, Wardell HeathSrikar, MD  ofloxacin (OCUFLOX) 0.3 % ophthalmic solution Place 1 drop into the left eye 3 (three) times daily. 10/05/17  Yes [provider]  prednisoLONE acetate (PRED FORTE) 1 % ophthalmic suspension Place 1 drop into the left eye 3 (three) times daily. 10/05/17  Yes [provider]  sitaGLIPtin (JANUVIA) 100 MG tablet Take 1 tablet by mouth daily.   Yes [provider]  tiotropium (SPIRIVA) 18 MCG inhalation capsule Place 18 mcg into inhaler and inhale daily.   Yes [provider]  VENTOLIN HFA 108 (90 Base) MCG/ACT inhaler Inhale 1 puff into the lungs every 6 (six) hours as needed. 06/25/16  Yes [provider]  predniSONE (DELTASONE) 10 MG tablet 50 mg daily for 4 more days. Patient not taking: Reported on 10/12/2017 09/01/16   Governor RooksLord, Rebecca, MD  predniSONE (DELTASONE) 20 MG tablet Take 3 tablets (60 mg total) by mouth daily. Patient not taking: Reported on 10/12/2017 01/06/17   Rebecka Apley, MD    Allergies Metoprolol  Family History  Problem Relation Age of Onset  . Asthma Brother     Social History Social History   Tobacco Use  . Smoking status: Never Smoker  . Smokeless tobacco: Current User    Types: Chew  Substance Use Topics  . Alcohol use: Yes    Comment: socially last drink 2 weeks ago  . Drug use: No     Review of Systems Constitutional: Increased fatigue Eyes: No visual changes. ENT: No sore throat. Cardiovascular: Denies chest pain. Respiratory: See HPI Gastrointestinal: No abdominal pain.  No nausea, no vomiting.  No diarrhea.  No constipation. Genitourinary: Negative for dysuria. Musculoskeletal: Negative for back pain. Skin: Negative for rash. Neurological: Negative for headaches, focal weakness or numbness.    ____________________________________________   PHYSICAL EXAM:  VITAL SIGNS: ED Triage Vitals [10/12/17 1413]  Enc Vitals Group     BP 132/69     Pulse Rate (!) 104     Resp (!) 22     Temp 98.9 F (37.2 C)     Temp Source Oral     SpO2 90 %     Weight 170 lb (77.1 kg)     Height      Head Circumference      Peak Flow      Pain Score      Pain Loc      Pain Edu?      Excl. in GC?    Constitutional: Alert and oriented.  Generally ill-appearing in no distress, very pleasant.  He appears slightly diaphoretic Eyes: Conjunctivae are normal on the left, the right side has a slight conjunctival hemorrhage. Head: Atraumatic. Nose: No congestion/rhinnorhea. Mouth/Throat: Mucous membranes are moist. Neck: No stridor. Cardiovascular: Slightly tachycardic rate, regular rhythm. Grossly normal heart sounds.  Good peripheral circulation. Respiratory: Mild tachypnea, with rhonchus rales in the lungs throughout.  no retractions.  Speaks in full sentences. Gastrointestinal: Soft and nontender. No distention. Musculoskeletal: No lower extremity tenderness nor edema. Neurologic:  Normal speech and language. No gross focal neurologic deficits are appreciated.  Skin:  Skin is slightly diaphoretic and intact. No rash noted. Psychiatric: Mood and affect are normal. Speech and behavior are normal.  ____________________________________________   LABS (all labs ordered are listed, but only abnormal results are displayed)  Labs Reviewed  BASIC METABOLIC PANEL -  Abnormal; Notable for the following components:      Result Value   Sodium 134 (*)    Chloride 98 (*)    Glucose, Bld 268 (*)    Calcium 8.3 (*)    All other components within normal limits  CBC - Abnormal; Notable for the following components:   RBC 3.98 (*)    Hemoglobin 12.6 (*)    HCT 37.6 (*)    All other components within normal limits  TROPONIN I - Abnormal; Notable for the following components:   Troponin I 0.07 (*)    All other components within normal limits  TROPONIN I - Abnormal; Notable for the following components:   Troponin I 0.05 (*)    All other components within normal limits  GLUCOSE, CAPILLARY - Abnormal; Notable for the following components:   Glucose-Capillary 224 (*)    All other components within normal limits  CULTURE, BLOOD (ROUTINE X 2)  CULTURE, BLOOD (ROUTINE X  2)  CULTURE, EXPECTORATED SPUTUM-ASSESSMENT  GRAM STAIN  LACTIC ACID, PLASMA  INFLUENZA PANEL BY PCR (TYPE A & B)  STREP PNEUMONIAE URINARY ANTIGEN  LEGIONELLA PNEUMOPHILA SEROGP 1 UR AG  HIV ANTIBODY (ROUTINE TESTING)  HEMOGLOBIN A1C   ____________________________________________  EKG  Reviewed and entered by me at 1420 Heart rate 109 QRS 110 Sinus tachycardia, with deep x-rays noted inferiorly, also in V3, no evidence of acute ischemic changes noted.  Suspicious for mild LVH ____________________________________________  RADIOLOGY    Chest x-ray reviewed, left lower lobe pneumonia ____________________________________________   PROCEDURES  Procedure(s) performed: None  Procedures  Critical Care performed: Yes, see critical care note(s)  CRITICAL CARE Performed by: Sharyn Creamer   Total critical care time: 35 minutes  Critical care time was exclusive of separately billable procedures and treating other patients.  Critical care was necessary to treat or prevent imminent or life-threatening deterioration.  Critical care was time spent personally by me on the following  activities: development of treatment plan with patient and/or surrogate as well as nursing, discussions with consultants, evaluation of patient's response to treatment, examination of patient, obtaining history from patient or surrogate, ordering and performing treatments and interventions, ordering and review of laboratory studies, ordering and review of radiographic studies, pulse oximetry and re-evaluation of patient's condition.  Patient with evidence to support sepsis.  Broad-spectrum antibiotics, including need for 2 different antibiotics to cover both MRSA and Pseudomonas which she has had previous.  Discussed with and consulted with pulmonary.  Patient being admitted to the hospital for further workup and care.  Patient at high risk for worsening of condition or cardiovascular collapse secondary to identified sepsis. ____________________________________________   INITIAL IMPRESSION / ASSESSMENT AND PLAN / ED COURSE  Pertinent labs & imaging results that were available during my care of the patient were reviewed by me and considered in my medical decision making (see chart for details).  Patient presents for evaluation of cough productive sputum.  History of bronchiectasis, rhonchi and rales noted throughout.  Increased work of breathing.  Troponin is slightly elevated no chest pain, no evidence of obvious ischemia on twelve-lead, concerning for possible demand ischemia.  Will give aspirin.  Continue to follow.  Patient's presentation appears consistent with likely exacerbation of COPD and bronchiectasis, discussed with Dr. Lennette Bihari of pulmonary, he recommends treatment with vancomycin and Zosyn based on previous medical history of MRSA as well as pseudomonal pneumonia.  Patient does meet sepsis criteria, will check lactate, begin fluid resuscitation, no evidence of severe sepsis yet to noted, no hypotension no elevated lactate yet.  Patient will be admitted for further care for probable sepsis,  pneumonia, bronchiectasis and COPD exacerbation      ____________________________________________   FINAL CLINICAL IMPRESSION(S) / ED DIAGNOSES  Final diagnoses:  Pneumonia of left lower lobe due to infectious organism (HCC)  Bronchiectasis with acute exacerbation (HCC)  COPD with acute exacerbation (HCC)  Demand ischemia (HCC)  Sepsis, due to unspecified organism [facility])      NEW MEDICATIONS STARTED DURING THIS VISIT:  Current Discharge Medication List       Note:  This document was prepared using Dragon voice recognition software and may include unintentional dictation errors.     Sharyn Creamer, MD 10/12/17 2142    Sharyn Creamer, MD 10/12/17 [phone]

## 2017-10-12 NOTE — ED Triage Notes (Signed)
Pt to ed with c/o sob x several days, worse today. Pt on home O2 all the time at 2 lpm.  Pt alert and oriented.  Pt reports cough, productive yellow sputum x 1 week.  Pt with increased sob with activity, denies swelling to lower extremities.  Pt skin warm and dry.  Pt with labored resp while sitting in chair.

## 2017-10-13 LAB — GLUCOSE, CAPILLARY
GLUCOSE-CAPILLARY: 350 mg/dL — AB (ref 65–99)
GLUCOSE-CAPILLARY: 282 mg/dL — AB (ref 65–99)
Glucose-Capillary: 430 mg/dL — ABNORMAL HIGH (ref 65–99)
Glucose-Capillary: 175 mg/dL — ABNORMAL HIGH (ref 65–99)

## 2017-10-13 LAB — HEMOGLOBIN A1C
Hgb A1c MFr Bld: 8.2 % — ABNORMAL HIGH (ref 4.8–5.6)
MEAN PLASMA GLUCOSE: 188.64 mg/dL

## 2017-10-13 MED ORDER — IPRATROPIUM-ALBUTEROL 0.5-2.5 (3) MG/3ML IN SOLN
3.0000 mL | Freq: Three times a day (TID) | RESPIRATORY_TRACT | Status: DC
  Administered 2017-10-13 (×3): 3 mL via RESPIRATORY_TRACT
  Filled 2017-10-13 (×4): qty 3

## 2017-10-13 MED ORDER — TIOTROPIUM BROMIDE MONOHYDRATE 18 MCG IN CAPS
18.0000 ug | ORAL_CAPSULE | Freq: Every day | RESPIRATORY_TRACT | Status: DC
  Administered 2017-10-14: 18 ug via RESPIRATORY_TRACT
  Filled 2017-10-13: qty 5

## 2017-10-13 MED ORDER — INSULIN ASPART 100 UNIT/ML ~~LOC~~ SOLN
20.0000 [IU] | Freq: Once | SUBCUTANEOUS | Status: AC
  Administered 2017-10-13: 20 [IU] via SUBCUTANEOUS
  Filled 2017-10-13: qty 1

## 2017-10-13 MED ORDER — IPRATROPIUM-ALBUTEROL 0.5-2.5 (3) MG/3ML IN SOLN: 3.0000 mL | Freq: Four times a day (QID) | RESPIRATORY_TRACT | Status: DC | PRN

## 2017-10-13 NOTE — Progress Notes (Signed)
SVN changed to tid & q6prn at pts request. He did not want scheduled tx's during sleeping hrs.

## 2017-10-13 NOTE — Progress Notes (Signed)
MD Luberta MutterKonidena made aware of fs 430. Order was to give 20 units of novolog once sq. No further orders at this time.

## 2017-10-13 NOTE — Progress Notes (Signed)
[facility]  at [facility]   PATIENT NAME: George BlightJames Fedora    MR#:  [id number]  DATE OF BIRTH:  [birth year]  SUBJECTIVE: Patient came for shortness of breath, found to have left-sided pneumonia.  He says he feels slightly better than yesterday.  Less cough.  Less shortness of breath.  No new complaints.  CHIEF COMPLAINT:   Chief Complaint  Patient presents with  . Shortness of Breath    REVIEW OF SYSTEMS:   ROS CONSTITUTIONAL: No fever, fatigue or weakness.  EYES: No blurred or double vision.  EARS, NOSE, AND THROAT: No tinnitus or ear pain.  RESPIRATORY: Less cough, shortness of breath.  Marland Kitchen.  CARDIOVASCULAR: No chest pain, orthopnea, edema.  GASTROINTESTINAL: No nausea, vomiting, diarrhea or abdominal pain.  GENITOURINARY: No dysuria, hematuria.  ENDOCRINE: No polyuria, nocturia,  HEMATOLOGY: No anemia, easy bruising or bleeding SKIN: No rash or lesion. MUSCULOSKELETAL: No joint pain or arthritis.   NEUROLOGIC: No tingling, numbness, weakness.  PSYCHIATRY: No anxiety or depression.   DRUG ALLERGIES:   Allergies  Allergen Reactions  . Metoprolol     Pt states syncope with lopressor    VITALS:  Blood pressure 123/73, pulse 86, temperature 97.7 F (36.5 C), temperature source Oral, resp. rate 19, height 5\' 10"  (1.778 m), weight 73.3 kg (161 lb 9.6 oz), SpO2 97 %.  PHYSICAL EXAMINATION:  GENERAL:  84 y.o.-year-old patient lying in the bed with no acute distress.  EYES: Pupils equal, round, reactive to light and accommodation. No scleral icterus. Extraocular muscles intact.  HEENT: Head atraumatic, normocephalic. Oropharynx and nasopharynx clear.  NECK:  Supple, no jugular venous distention. No thyroid enlargement, no tenderness.  LUNGS: Faint expiratory wheeze present on the left side but otherwise clear. CARDIOVASCULAR: S1, S2 normal. No murmurs, rubs, or gallops.  ABDOMEN: Soft, nontender, nondistended. Bowel sounds present. No  organomegaly or mass.  EXTREMITIES: No pedal edema, cyanosis, or clubbing.  NEUROLOGIC: Cranial nerves II through XII are intact. Muscle strength 5/5 in all extremities. Sensation intact. Gait not checked.  PSYCHIATRIC: The patient is alert and oriented x 3.  SKIN: No obvious rash, lesion, or ulcer.    LABORATORY PANEL:   CBC Recent Labs  Lab 10/12/17 1417  WBC 10.3  HGB 12.6*  HCT 37.6*  PLT 264   ------------------------------------------------------------------------------------------------------------------  Chemistries  Recent Labs  Lab 10/12/17 1417  NA 134*  K 4.0  CL 98*  CO2 26  GLUCOSE 268*  BUN 17  CREATININE 1.02  CALCIUM 8.3*   ------------------------------------------------------------------------------------------------------------------  Cardiac Enzymes Recent Labs  Lab 10/12/17 1751  TROPONINI 0.05*   ------------------------------------------------------------------------------------------------------------------  RADIOLOGY:  Dg Chest 2 View  Result Date: 10/12/2017 CLINICAL DATA:  Increasing shortness of breath over the past 6 weeks. EXAM: CHEST  2 VIEW COMPARISON:  Chest x-ray dated Jan 06, 2017. FINDINGS: The heart size and mediastinal contours are within normal limits. Normal pulmonary vascularity. The lungs are hyperinflated. Patchy consolidation in the posterior left lower lobe. No pleural effusion or pneumothorax. No acute osseous abnormality. IMPRESSION: 1. Left lower lobe pneumonia. Followup PA and lateral chest X-ray is recommended in 3-4 weeks following trial of antibiotic therapy to ensure resolution and exclude underlying malignancy. Electronically Signed   By: Obie DredgeWilliam T Derry M.D.   On: 10/12/2017 15:05    EKG:   Orders placed or performed during the hospital encounter of 10/12/17  . ED EKG  . ED EKG  . ED EKG 12-Lead  . ED EKG 12-Lead  ASSESSMENT AND PLAN:   #1.acute on chronic respiratory failure secondary to  community-acquired pneumonia: Patient started on IV vancomycin, Zosyn as per pulmonary recommendation is Dr. Nickolas Madrid who was consulted by admitting doctor.  Continue vancomycin, Zosyn, patient is clinically improving, wean down oxygen to 2 L.  Patient on chronic oxygen of 2 L at home.  Check urine Legionella antigen,   #2/bronchiectasis.  Continue bronchodilators but change the dosing so he can rest at night without disturbance. 3.  Diabetes mellitus type 2: Uncontrolled secondary to steroids: Continue metformin, Tradjenta,, Amaryl, SSI with coverage. 4.  Likely discharge home tomorrow.  He is agreeable for this plan.  All the records are reviewed and case discussed with Care Management/Social Workerr. Management plans discussed with the patient, family and they are in agreement.  CODE STATUS:  fullCode  TOTAL TIME TAKING CARE OF THIS PATIENT: 35 minutes.   POSSIBLE D/C IN 1-2 DAYS, DEPENDING ON CLINICAL CONDITION.   Katha Hamming M.D on 10/13/2017 at 8:52 AM  Between 7am to 6pm - Pager - [phone]  After 6pm go to [email or web address] - password [email or web address]  [facility]  Office  [phone]  CC: Primary care physician; Danella Penton, MD   Note: This dictation was prepared with Dragon dictation along with smaller phrase technology. Any transcriptional errors that result from this process are unintentional.

## 2017-10-13 NOTE — Progress Notes (Signed)
Called MD on call to notify of pt concern that he had not received [place] today, not ordered and is on pt PTA med list. MD ordered to restart in am. Order placed

## 2017-10-14 LAB — GLUCOSE, CAPILLARY: Glucose-Capillary: 149 mg/dL — ABNORMAL HIGH (ref 65–99)

## 2017-10-14 MED ORDER — AMOXICILLIN-POT CLAVULANATE 875-125 MG PO TABS: 1.0000 | ORAL_TABLET | Freq: Two times a day (BID) | ORAL | 0 refills | Status: AC

## 2017-10-14 MED ORDER — PREDNISONE 10 MG (21) PO TBPK: ORAL_TABLET | ORAL | 0 refills | Status: DC

## 2017-10-14 NOTE — Progress Notes (Signed)
Patient alert and oriented, vss, no complaints of pain.  D/c home today with wife.  [facility] and f/u with Dr. Hyacinth MeekerMiller.  No questions.  Able to repeat back information about antibitotics.    To be escorted out of hospital via wheelchair by volunteers.

## 2017-10-14 NOTE — Care Management (Signed)
RNCM spoke with patient. He denies RNCM needs. Has chronic O2 at home through [facility]- His wife will provide transportation to home today and portable O2.  He states he is independent at home. He denies problems obtaining his medications including diabetic medications.  He states he is able to check his blood sugars without difficulty. He will call/follow up with Dr. Bethann PunchesMark Miller post discharge.

## 2017-10-15 DIAGNOSIS — J4540 Moderate persistent asthma, uncomplicated: Secondary | ICD-10-CM | POA: Diagnosis not present

## 2017-10-15 DIAGNOSIS — J411 Mucopurulent chronic bronchitis: Secondary | ICD-10-CM | POA: Diagnosis not present

## 2017-10-15 DIAGNOSIS — J181 Lobar pneumonia, unspecified organism: Secondary | ICD-10-CM | POA: Diagnosis not present

## 2017-10-15 LAB — HIV ANTIBODY (ROUTINE TESTING W REFLEX): HIV SCREEN 4TH GENERATION: NONREACTIVE

## 2017-10-16 DIAGNOSIS — J471 Bronchiectasis with (acute) exacerbation: Secondary | ICD-10-CM | POA: Diagnosis not present

## 2017-10-16 DIAGNOSIS — J4540 Moderate persistent asthma, uncomplicated: Secondary | ICD-10-CM | POA: Diagnosis not present

## 2017-10-16 DIAGNOSIS — J439 Emphysema, unspecified: Secondary | ICD-10-CM | POA: Diagnosis not present

## 2017-10-17 LAB — CULTURE, BLOOD (ROUTINE X 2)
CULTURE: NO GROWTH
CULTURE: NO GROWTH
Special Requests: ADEQUATE

## 2017-10-17 NOTE — Discharge Summary (Addendum)
George Hensley, is a 84 y.o. male  DOB 07/20/1931  MRN [id number].  Admission date:  10/12/2017  Admitting Physician  Bertrum Sol, MD  Discharge Date:  10/14/2017   Primary MD  Danella Penton, MD  Recommendations for primary care physician for things to follow:   Follow with PCP in 1 week   Admission Diagnosis  Bronchiectasis with acute exacerbation (HCC) [J47.1] Demand ischemia (HCC) [I24.8] COPD with acute exacerbation (HCC) [J44.1] Pneumonia of left lower lobe due to infectious organism [facility]) [J18.1]   Discharge Diagnosis  Bronchiectasis with acute exacerbation (HCC) [J47.1] Demand ischemia (HCC) [I24.8] COPD with acute exacerbation (HCC) [J44.1] Pneumonia of left lower lobe due to infectious organism (HCC) [J18.1]    Active Problems:   CAP (community acquired pneumonia)      Past Medical History:  Diagnosis Date  . Asthma   . Bronchiectasis (HCC)   . COPD (chronic obstructive pulmonary disease) (HCC)   . Diabetes mellitus     Past Surgical History:  Procedure Laterality Date  . CHOLECYSTECTOMY  2000  . CORONARY STENT PLACEMENT  1999       History of present illness and  Hospital Course:     Kindly see H&P for history of present illness and admission details, please review complete Labs, Consult reports and Test reports for all details in brief  HPI  from the history and physical done on the day of admission  84 year old male patient with history of chronic bronchiectasis, history of Pseudomonas infections, and currently Cipro admitted for shortness of breath found to have acute pneumonia.  Patient had productive phlegm.  Chest x-ray showed left lower lobe pneumonia.  Hospital Course  Acute on chronic respiratory failure secondary to community-acquired pneumonia.  Patient received vancomycin,  Zosyn, seen by pulmonary Dr. Nickolas Madrid.  He felt much better, discharged home with Augmentin, azithromycin, prednisone Dosepak and patient already uses oxygen 2 L at home.  Patient is independent of ADLs at home.  Diabetes mellitus type 2: Uncontrolled secondary to steroids.  Continue metformin, Tradjenta, Amaryl, he received sliding scale insulin with coverage also.      Discharge Condition: Stable   Follow UP  Follow-up Information    Danella Penton, MD. Schedule an appointment as soon as possible for a visit on 10/22/2017.   Specialty:  Internal Medicine Why:  @ 11:00 am Contact information: [address] [place] [address] [phone]             Discharge Instructions  and  Discharge Medications      Allergies as of 10/14/2017      Reactions   Metoprolol    Pt states syncope with lopressor      Medication List    STOP taking these medications   predniSONE 10 MG tablet Commonly known as:  DELTASONE Replaced by:  predniSONE 10 MG (21) Tbpk tablet   predniSONE 20 MG tablet Commonly known as:  DELTASONE     TAKE these medications   amoxicillin-clavulanate 875-125 MG tablet Commonly known as:  AUGMENTIN Take 1 tablet by mouth 2 (two) times daily for 7 days.   aspirin EC 81 MG tablet Take 81 mg by mouth daily.   glimepiride 4 MG tablet Commonly known as:  AMARYL Take 4 mg by mouth 2 (two) times daily.   ketorolac 0.5 % ophthalmic solution Commonly known as:  ACULAR Place 1 drop into the left eye 3 (three) times daily.   metFORMIN 1000 MG  tablet Commonly known as:  GLUCOPHAGE Take 1,000 mg by mouth 2 (two) times daily.   mometasone-formoterol 200-5 MCG/ACT Aero Commonly known as:  DULERA Inhale 2 puffs into the lungs 2 (two) times daily.   ofloxacin 0.3 % ophthalmic solution Commonly known as:  OCUFLOX Place 1 drop into the left eye 3 (three) times daily.   prednisoLONE acetate 1 % ophthalmic suspension Commonly known as:  PRED  FORTE Place 1 drop into the left eye 3 (three) times daily.   predniSONE 10 MG (21) Tbpk tablet Commonly known as:  STERAPRED UNI-PAK 21 TAB Taper by 10 mg daily Replaces:  predniSONE 10 MG tablet   sitaGLIPtin 100 MG tablet Commonly known as:  JANUVIA Take 1 tablet by mouth daily.   tiotropium 18 MCG inhalation capsule Commonly known as:  SPIRIVA Place 18 mcg into inhaler and inhale daily.   VENTOLIN HFA 108 (90 Base) MCG/ACT inhaler Generic drug:  albuterol Inhale 1 puff into the lungs every 6 (six) hours as needed.   VITAMIN D-1000 MAX ST 1000 units tablet Generic drug:  Cholecalciferol Take 1 tablet by mouth 3 (three) times a week.     ASK your doctor about these medications   azithromycin 250 MG tablet Commonly known as:  ZITHROMAX Take 250-500 mg by mouth daily. Take 500 mg by mouth on day 1. Take 250 mg by mouth daily for 4 days. Ask about: Should I take this medication?         Diet and Activity recommendation: See Discharge Instructions above   Consults obtained ; none   Major procedures and Radiology Reports - PLEASE review detailed and final reports for all details, in brief -     Dg Chest 2 View  Result Date: 10/12/2017 CLINICAL DATA:  Increasing shortness of breath over the past 6 weeks. EXAM: CHEST  2 VIEW COMPARISON:  Chest x-ray dated Jan 06, 2017. FINDINGS: The heart size and mediastinal contours are within normal limits. Normal pulmonary vascularity. The lungs are hyperinflated. Patchy consolidation in the posterior left lower lobe. No pleural effusion or pneumothorax. No acute osseous abnormality. IMPRESSION: 1. Left lower lobe pneumonia. Followup PA and lateral chest X-ray is recommended in 3-4 weeks following trial of antibiotic therapy to ensure resolution and exclude underlying malignancy. Electronically Signed   By: Obie Dredge M.D.   On: 10/12/2017 15:05    Micro Results    Recent Results (from the past 240 hour(s))  Blood Culture  (routine x 2)     Status: None   Collection Time: 10/12/17  5:51 PM  Result Value Ref Range Status   Specimen Description BLOOD BLOOD RIGHT WRIST  Final   Special Requests   Final    BOTTLES DRAWN AEROBIC AND ANAEROBIC Blood Culture adequate volume   Culture   Final    NO GROWTH 5 DAYS Performed at [facility], [address] [place], [place] [address]    Report Status 10/17/2017 FINAL  Final  Blood Culture (routine x 2)     Status: None   Collection Time: 10/12/17  5:51 PM  Result Value Ref Range Status   Specimen Description BLOOD BLOOD LEFT WRIST  Final   Special Requests   Final    BOTTLES DRAWN AEROBIC AND ANAEROBIC Blood Culture results may not be optimal due to an inadequate volume of blood received in culture bottles   Culture   Final    NO GROWTH 5 DAYS Performed at [facility], [address] [place], [place] [id number]    Report Status 10/17/2017 FINAL  Final       Today   Subjective:   George Hensley today has no headache,no chest abdominal pain,no new weakness tingling or numbness, feels much better wants to go home today.   Objective:   Blood pressure 99/64, pulse 78, temperature (!) 97.5 F (36.4 C), temperature source Oral, resp. rate 18, height 5\' 10"  (1.778 m), weight 73.3 kg (161 lb 9.6 oz), SpO2 92 %.  No intake or output data in the 24 hours ending 10/17/17 1442  Exam Awake Alert, Oriented x 3, No new F.N deficits, Normal affect [place].AT,PERRAL Supple Neck,No JVD, No cervical lymphadenopathy appriciated.  Symmetrical Chest wall movement, Good air movement bilaterally, CTAB RRR,No Gallops,Rubs or new Murmurs, No Parasternal Heave +ve B.Sounds, Abd Soft, Non tender, No organomegaly appriciated, No rebound -guarding or rigidity. No Cyanosis, Clubbing or edema, No new Rash or bruise  Data Review   CBC w Diff:  Lab Results  Component Value Date   WBC 10.3 10/12/2017   HGB 12.6 (L) 10/12/2017   HGB 11.1 (L) 05/18/2014   HCT  37.6 (L) 10/12/2017   HCT 34.7 (L) 05/18/2014   PLT 264 10/12/2017   PLT 217 05/18/2014   LYMPHOPCT 14 01/06/2017   LYMPHOPCT 14.6 09/21/2013   MONOPCT 4 01/06/2017   MONOPCT 14.8 09/21/2013   EOSPCT 5 01/06/2017   EOSPCT 0.6 09/21/2013   BASOPCT 1 01/06/2017   BASOPCT 0.4 09/21/2013    CMP:  Lab Results  Component Value Date   NA 134 (L) 10/12/2017   NA 135 (L) 05/18/2014   K 4.0 10/12/2017   K 3.6 05/18/2014   CL 98 (L) 10/12/2017   CL 101 05/18/2014   CO2 26 10/12/2017   CO2 24 05/18/2014   BUN 17 10/12/2017   BUN 16 05/18/2014   CREATININE 1.02 10/12/2017   CREATININE 0.96 05/18/2014   PROT 7.1 01/06/2017   PROT 6.2 (L) 09/21/2013   ALBUMIN 4.2 01/06/2017   ALBUMIN 3.2 (L) 09/21/2013   BILITOT 1.0 01/06/2017   BILITOT 0.7 09/21/2013   ALKPHOS 39 01/06/2017   ALKPHOS 42 (L) 09/21/2013   AST 37 01/06/2017   AST 43 (H) 09/21/2013   ALT 41 01/06/2017   ALT 30 09/21/2013  .   Total Time in preparing paper work, data evaluation and todays exam - 35 minutes  Katha HammingSnehalatha Dayanna Pryce M.D on 10/14/2017 at 2:42 PM    Note: This dictation was prepared with Dragon dictation along with smaller phrase technology. Any transcriptional errors that result from this process are unintentional.                                                                                                                                                                                      .

## 2017-11-05 DIAGNOSIS — J189 Pneumonia, unspecified organism: Secondary | ICD-10-CM | POA: Diagnosis not present

## 2017-11-05 DIAGNOSIS — J411 Mucopurulent chronic bronchitis: Secondary | ICD-10-CM | POA: Diagnosis not present

## 2017-11-05 DIAGNOSIS — J4540 Moderate persistent asthma, uncomplicated: Secondary | ICD-10-CM | POA: Diagnosis not present

## 2017-11-05 DIAGNOSIS — J471 Bronchiectasis with (acute) exacerbation: Secondary | ICD-10-CM | POA: Diagnosis not present

## 2017-11-05 DIAGNOSIS — J40 Bronchitis, not specified as acute or chronic: Secondary | ICD-10-CM | POA: Diagnosis not present

## 2017-11-05 DIAGNOSIS — J439 Emphysema, unspecified: Secondary | ICD-10-CM | POA: Diagnosis not present

## 2017-11-13 DIAGNOSIS — J471 Bronchiectasis with (acute) exacerbation: Secondary | ICD-10-CM | POA: Diagnosis not present

## 2017-11-13 DIAGNOSIS — J4540 Moderate persistent asthma, uncomplicated: Secondary | ICD-10-CM | POA: Diagnosis not present

## 2017-11-13 DIAGNOSIS — J439 Emphysema, unspecified: Secondary | ICD-10-CM | POA: Diagnosis not present

## 2017-11-18 DIAGNOSIS — E1165 Type 2 diabetes mellitus with hyperglycemia: Secondary | ICD-10-CM | POA: Diagnosis not present

## 2017-11-18 DIAGNOSIS — Z125 Encounter for screening for malignant neoplasm of prostate: Secondary | ICD-10-CM | POA: Diagnosis not present

## 2017-11-18 DIAGNOSIS — E538 Deficiency of other specified B group vitamins: Secondary | ICD-10-CM | POA: Diagnosis not present

## 2017-11-18 DIAGNOSIS — E782 Mixed hyperlipidemia: Secondary | ICD-10-CM | POA: Diagnosis not present

## 2017-11-25 DIAGNOSIS — E782 Mixed hyperlipidemia: Secondary | ICD-10-CM | POA: Diagnosis not present

## 2017-11-25 DIAGNOSIS — Z0000 Encounter for general adult medical examination without abnormal findings: Secondary | ICD-10-CM | POA: Diagnosis not present

## 2017-11-25 DIAGNOSIS — J9611 Chronic respiratory failure with hypoxia: Secondary | ICD-10-CM | POA: Diagnosis not present

## 2017-11-25 DIAGNOSIS — E119 Type 2 diabetes mellitus without complications: Secondary | ICD-10-CM | POA: Diagnosis not present

## 2017-12-14 DIAGNOSIS — J471 Bronchiectasis with (acute) exacerbation: Secondary | ICD-10-CM | POA: Diagnosis not present

## 2017-12-14 DIAGNOSIS — J439 Emphysema, unspecified: Secondary | ICD-10-CM | POA: Diagnosis not present

## 2017-12-14 DIAGNOSIS — J4540 Moderate persistent asthma, uncomplicated: Secondary | ICD-10-CM | POA: Diagnosis not present

## 2018-01-13 DIAGNOSIS — J471 Bronchiectasis with (acute) exacerbation: Secondary | ICD-10-CM | POA: Diagnosis not present

## 2018-01-13 DIAGNOSIS — J4540 Moderate persistent asthma, uncomplicated: Secondary | ICD-10-CM | POA: Diagnosis not present

## 2018-01-13 DIAGNOSIS — J439 Emphysema, unspecified: Secondary | ICD-10-CM | POA: Diagnosis not present

## 2018-01-16 ENCOUNTER — Emergency Department
Admission: EM | Admit: 2018-01-16 | Discharge: 2018-01-16 | Disposition: A | Discharge status: Home / Self Care | Payer: Medicare Other | Attending: Emergency Medicine | Admitting: Emergency Medicine

## 2018-01-16 ENCOUNTER — Emergency Department: Payer: Medicare Other

## 2018-01-16 DIAGNOSIS — Z5321 Procedure and treatment not carried out due to patient leaving prior to being seen by health care provider: Secondary | ICD-10-CM | POA: Diagnosis not present

## 2018-01-16 DIAGNOSIS — R05 Cough: Secondary | ICD-10-CM | POA: Insufficient documentation

## 2018-01-16 DIAGNOSIS — R5383 Other fatigue: Secondary | ICD-10-CM | POA: Insufficient documentation

## 2018-01-16 DIAGNOSIS — R739 Hyperglycemia, unspecified: Secondary | ICD-10-CM | POA: Insufficient documentation

## 2018-01-16 LAB — GLUCOSE, CAPILLARY: Glucose-Capillary: 108 mg/dL — ABNORMAL HIGH (ref 65–99)

## 2018-01-16 LAB — BASIC METABOLIC PANEL
Anion gap: 6 (ref 5–15)
BUN: 17 mg/dL (ref 6–20)
CHLORIDE: 104 mmol/L (ref 101–111)
CO2: 29 mmol/L (ref 22–32)
CREATININE: 0.95 mg/dL (ref 0.61–1.24)
Calcium: 9 mg/dL (ref 8.9–10.3)
GFR calc Af Amer: >60 mL/min (ref >60)
GFR calc non Af Amer: >60 mL/min (ref >60)
GLUCOSE: 115 mg/dL — AB (ref 65–99)
Potassium: 4 mmol/L (ref 3.5–5.1)
SODIUM: 139 mmol/L (ref 135–145)

## 2018-01-16 LAB — CBC
HCT: 41 % (ref 40.0–52.0)
Hemoglobin: 13.8 g/dL (ref 13.0–18.0)
MCH: 31.9 pg (ref 26.0–34.0)
MCHC: 33.7 g/dL (ref 32.0–36.0)
MCV: 94.7 fL (ref 80.0–100.0)
PLATELETS: 248 10*3/uL (ref 150–440)
RBC: 4.32 MIL/uL — ABNORMAL LOW (ref 4.40–5.90)
RDW: 14.4 % (ref 11.5–14.5)
WBC: 8 10*3/uL (ref 3.8–10.6)

## 2018-01-16 NOTE — ED Notes (Signed)
Pt requesting to leave. Discussed the need to stay and be assessed with pt wife. Pt refusing and states he will follow up with his MD. Pt states its cold in the [place]. Pt given blankets and offered more for comfort. Pt states does not want to stay.

## 2018-01-16 NOTE — ED Triage Notes (Signed)
Patient c/o hyperglycemia at home. Patient's CBG was 500 at home. Patient's CBG in triage was 108. Patient c/o generalized fatigue since Monday. Patient coughing in triage. Patient reports yellow sputum with cough

## 2018-01-23 DIAGNOSIS — L570 Actinic keratosis: Secondary | ICD-10-CM | POA: Diagnosis not present

## 2018-01-23 DIAGNOSIS — L578 Other skin changes due to chronic exposure to nonionizing radiation: Secondary | ICD-10-CM | POA: Diagnosis not present

## 2018-01-23 DIAGNOSIS — B351 Tinea unguium: Secondary | ICD-10-CM | POA: Diagnosis not present

## 2018-01-23 DIAGNOSIS — L821 Other seborrheic keratosis: Secondary | ICD-10-CM | POA: Diagnosis not present

## 2018-01-25 IMAGING — CT CT ANGIO CHEST
2 of 6 series · 18 of 46 positions shown · IV contrast (isovue)
Comparison: Plain film from earlier in the same day, 04/20/2014

CLINICAL DATA: Difficulty breathing and history of bronchiectasis

EXAM:
CT ANGIOGRAPHY CHEST WITH CONTRAST
TECHNIQUE: Multidetector CT imaging of the chest was performed using the
standard protocol during bolus administration of intravenous
contrast. Multiplanar CT image reconstructions and MIPs were
obtained to evaluate the vascular anatomy.
CONTRAST:  75 mL Isovue 370.

[Series 5: thins · axial · 0.75mm/px · z∈[+223,+517]mm · 16 of 322 slices shown]
[im 14/322  lung]
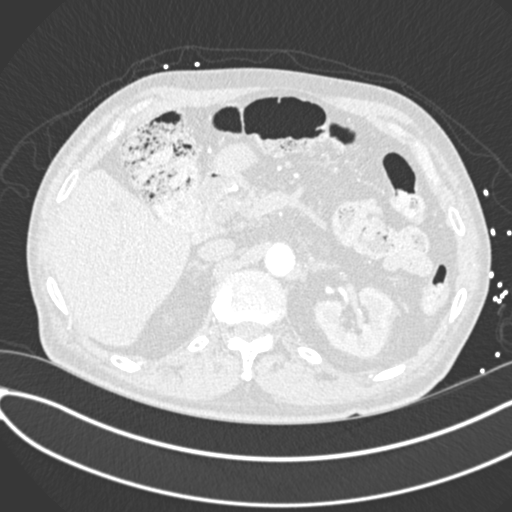
[im 42/322  soft-tissue]
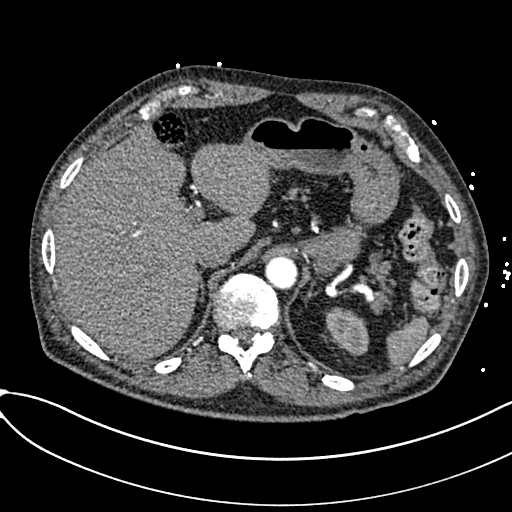
[im 56/322  lung]
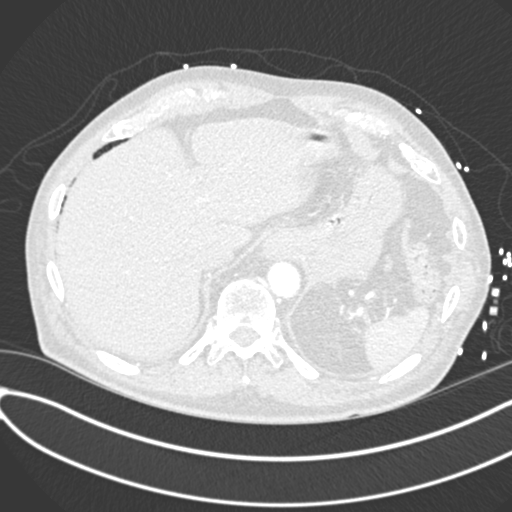
[im 70/322  soft-tissue]
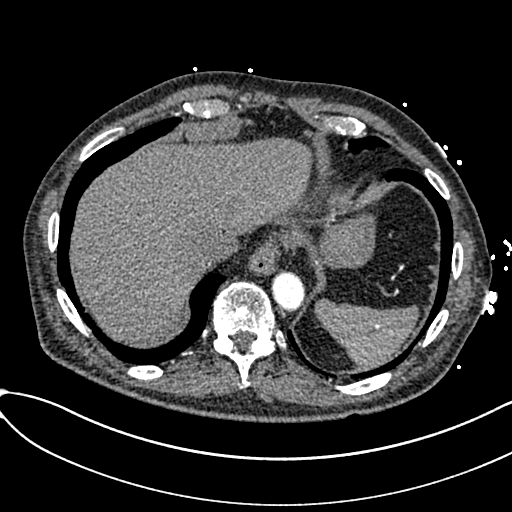
[im 98/322  lung]
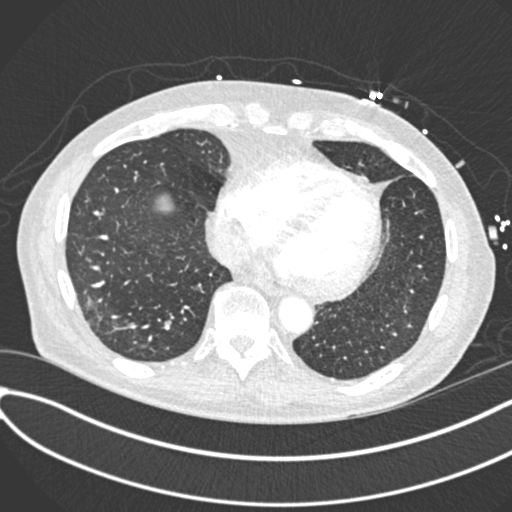
[im 112/322  soft-tissue]
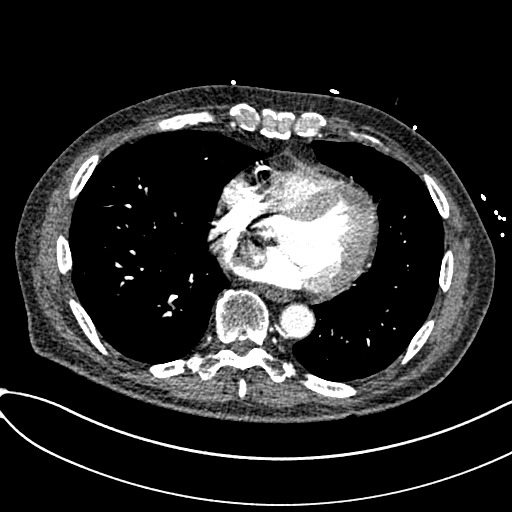
[im 126/322  lung]
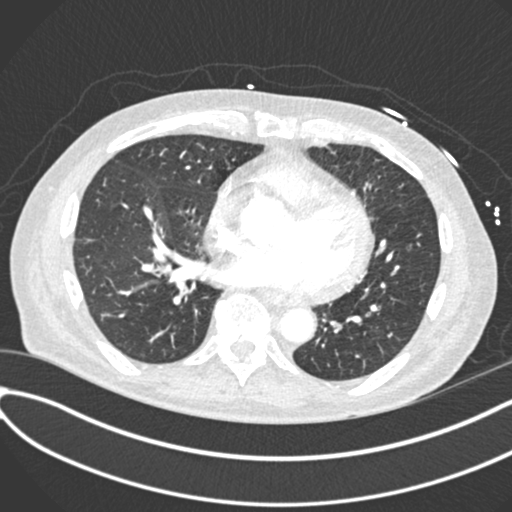
[im 154/322  soft-tissue]
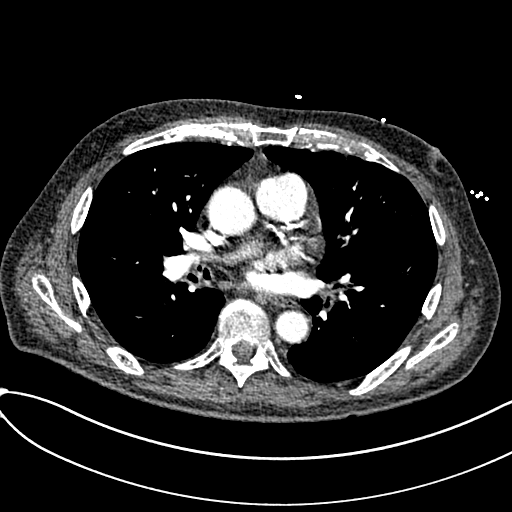
[im 168/322  lung]
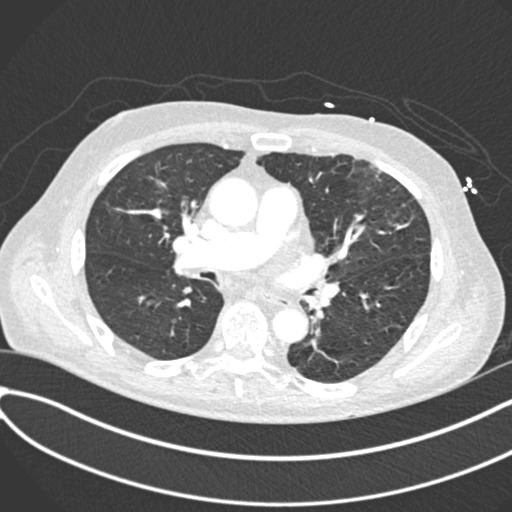
[im 196/322  soft-tissue]
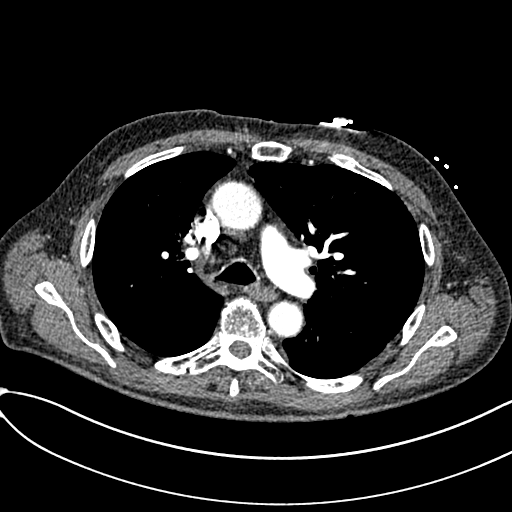
[im 210/322  lung]
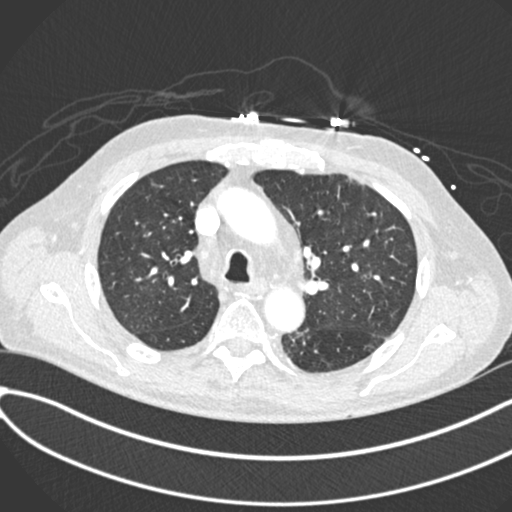
[im 224/322  soft-tissue]
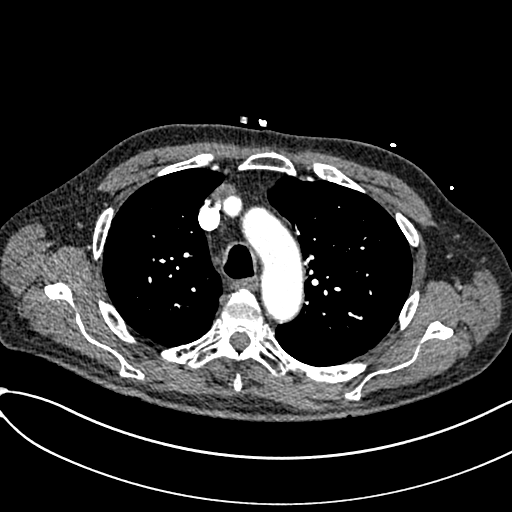
[im 252/322  lung]
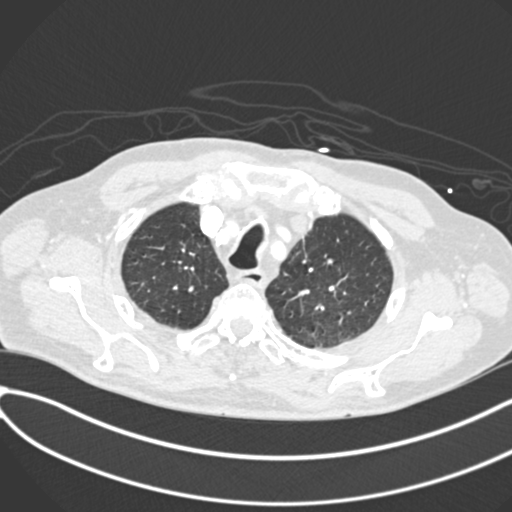
[im 266/322  soft-tissue]
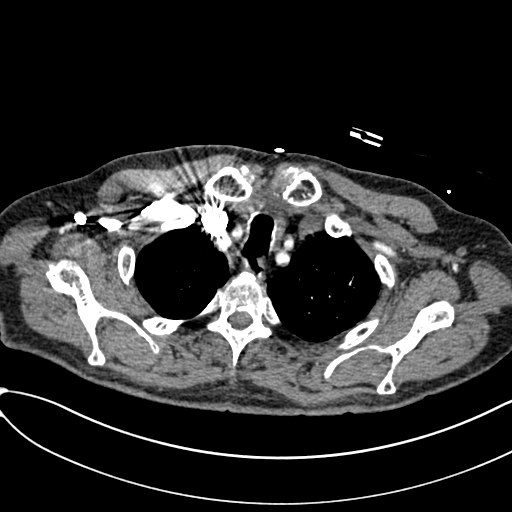
[im 280/322  lung]
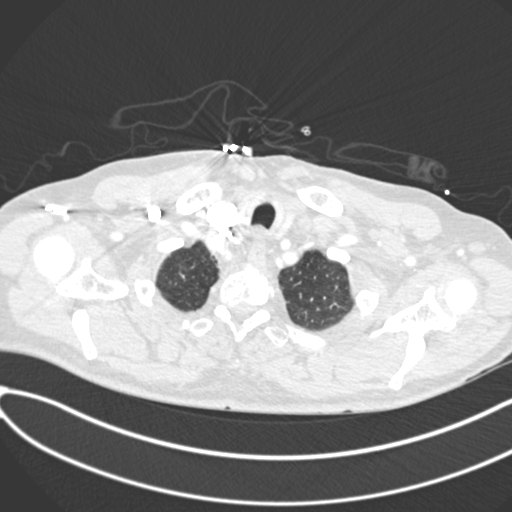
[im 308/322  soft-tissue]
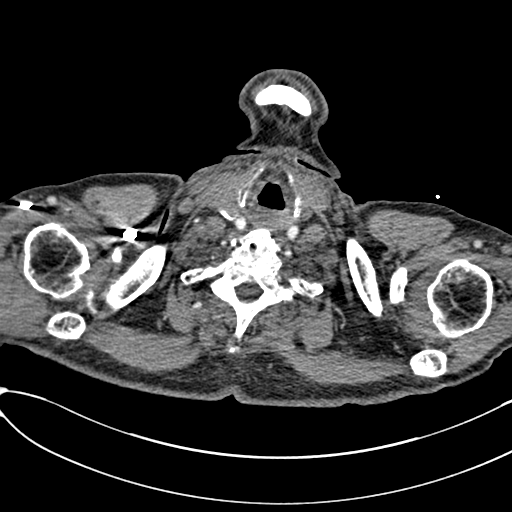

[Series 7: coronal mpr · coronal · 0.68mm/px · 2 of 86 slices shown]
[im 29/86  soft-tissue]
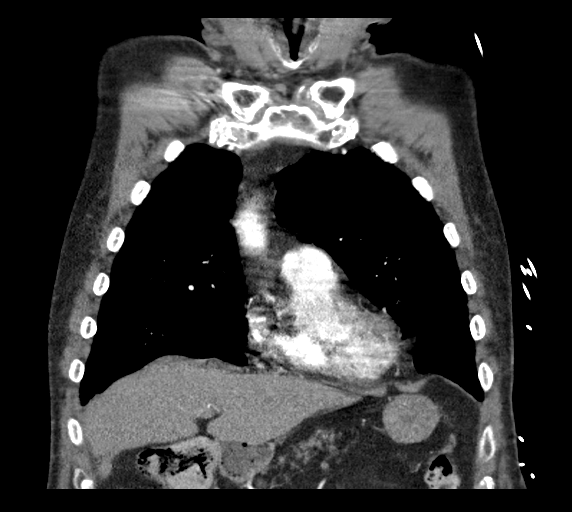
[im 57/86  soft-tissue]
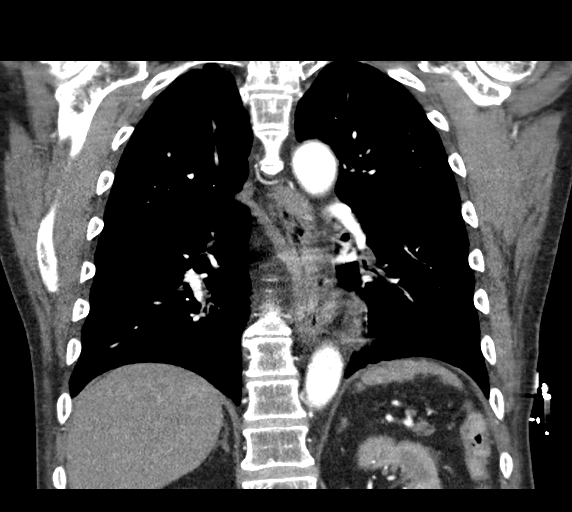

[18 of 46 positions shown; findings below may reference images not displayed]

FINDINGS: Cardiovascular: Calcific changes of the thoracic aorta and its
branches are seen. No aneurysmal dilatation or dissection is noted.
Coronary calcifications are seen. The cardiac structures are
otherwise within normal limits. The pulmonary artery as visualized
shows a normal branching pattern. No definitive filling defect to
suggest pulmonary embolism is noted.

Mediastinum/Nodes: The thoracic inlet is within normal limits. No
hilar or mediastinal adenopathy is seen. No axillary adenopathy is
noted.

Lungs/Pleura: Lungs are well aerated bilaterally. No focal confluent
infiltrate is seen. Mild dependent atelectatic changes are noted. No
focal pulmonary nodules are seen.

Upper Abdomen: Scattered hypodensities are noted within the liver
most consistent with cysts. No other focal abnormality in the upper
abdomen is noted. The gallbladder has been surgically removed.

Musculoskeletal: Degenerative change of the thoracic spine is noted.
No acute abnormality is seen.

Review of the MIP images confirms the above findings.
IMPRESSION: No evidence of pulmonary emboli.

Minimal dependent atelectatic changes.

Hypodensities within the liver likely representing cysts. These are
stable from the prior exam.

## 2018-02-11 DIAGNOSIS — J470 Bronchiectasis with acute lower respiratory infection: Secondary | ICD-10-CM | POA: Diagnosis not present

## 2018-02-11 DIAGNOSIS — J411 Mucopurulent chronic bronchitis: Secondary | ICD-10-CM | POA: Diagnosis not present

## 2018-02-13 DIAGNOSIS — J4540 Moderate persistent asthma, uncomplicated: Secondary | ICD-10-CM | POA: Diagnosis not present

## 2018-02-13 DIAGNOSIS — J439 Emphysema, unspecified: Secondary | ICD-10-CM | POA: Diagnosis not present

## 2018-02-13 DIAGNOSIS — J471 Bronchiectasis with (acute) exacerbation: Secondary | ICD-10-CM | POA: Diagnosis not present

## 2018-03-15 DIAGNOSIS — J439 Emphysema, unspecified: Secondary | ICD-10-CM | POA: Diagnosis not present

## 2018-03-15 DIAGNOSIS — J471 Bronchiectasis with (acute) exacerbation: Secondary | ICD-10-CM | POA: Diagnosis not present

## 2018-03-15 DIAGNOSIS — J4540 Moderate persistent asthma, uncomplicated: Secondary | ICD-10-CM | POA: Diagnosis not present

## 2018-04-14 DIAGNOSIS — N5201 Erectile dysfunction due to arterial insufficiency: Secondary | ICD-10-CM | POA: Diagnosis not present

## 2018-04-14 DIAGNOSIS — N401 Enlarged prostate with lower urinary tract symptoms: Secondary | ICD-10-CM | POA: Diagnosis not present

## 2018-04-14 DIAGNOSIS — R351 Nocturia: Secondary | ICD-10-CM | POA: Diagnosis not present

## 2018-04-14 DIAGNOSIS — R3915 Urgency of urination: Secondary | ICD-10-CM | POA: Diagnosis not present

## 2018-04-15 DIAGNOSIS — J439 Emphysema, unspecified: Secondary | ICD-10-CM | POA: Diagnosis not present

## 2018-04-15 DIAGNOSIS — J471 Bronchiectasis with (acute) exacerbation: Secondary | ICD-10-CM | POA: Diagnosis not present

## 2018-04-15 DIAGNOSIS — J4540 Moderate persistent asthma, uncomplicated: Secondary | ICD-10-CM | POA: Diagnosis not present

## 2018-05-06 DIAGNOSIS — J479 Bronchiectasis, uncomplicated: Secondary | ICD-10-CM | POA: Diagnosis not present

## 2018-05-06 DIAGNOSIS — J4540 Moderate persistent asthma, uncomplicated: Secondary | ICD-10-CM | POA: Diagnosis not present

## 2018-05-15 DIAGNOSIS — H52221 Regular astigmatism, right eye: Secondary | ICD-10-CM | POA: Diagnosis not present

## 2018-05-15 DIAGNOSIS — H25811 Combined forms of age-related cataract, right eye: Secondary | ICD-10-CM | POA: Diagnosis not present

## 2018-05-16 DIAGNOSIS — J4540 Moderate persistent asthma, uncomplicated: Secondary | ICD-10-CM | POA: Diagnosis not present

## 2018-05-16 DIAGNOSIS — J439 Emphysema, unspecified: Secondary | ICD-10-CM | POA: Diagnosis not present

## 2018-05-16 DIAGNOSIS — J471 Bronchiectasis with (acute) exacerbation: Secondary | ICD-10-CM | POA: Diagnosis not present

## 2018-05-29 ENCOUNTER — Other Ambulatory Visit: Payer: Self-pay

## 2018-05-29 ENCOUNTER — Encounter: Payer: Self-pay | Admitting: Emergency Medicine

## 2018-05-29 ENCOUNTER — Emergency Department: Payer: Medicare Other

## 2018-05-29 ENCOUNTER — Inpatient Hospital Stay
Admission: EM | Admit: 2018-05-29 | Discharge: 2018-05-30 | DRG: 190 | Disposition: A | Discharge status: Home / Self Care | Payer: Medicare Other | Attending: Internal Medicine | Admitting: Internal Medicine

## 2018-05-29 DIAGNOSIS — J9621 Acute and chronic respiratory failure with hypoxia: Secondary | ICD-10-CM | POA: Diagnosis present

## 2018-05-29 DIAGNOSIS — Z66 Do not resuscitate: Secondary | ICD-10-CM | POA: Diagnosis not present

## 2018-05-29 DIAGNOSIS — R7989 Other specified abnormal findings of blood chemistry: Secondary | ICD-10-CM

## 2018-05-29 DIAGNOSIS — R0603 Acute respiratory distress: Secondary | ICD-10-CM

## 2018-05-29 DIAGNOSIS — J449 Chronic obstructive pulmonary disease, unspecified: Secondary | ICD-10-CM | POA: Diagnosis not present

## 2018-05-29 DIAGNOSIS — J9622 Acute and chronic respiratory failure with hypercapnia: Secondary | ICD-10-CM | POA: Diagnosis not present

## 2018-05-29 DIAGNOSIS — I5032 Chronic diastolic (congestive) heart failure: Secondary | ICD-10-CM | POA: Diagnosis not present

## 2018-05-29 DIAGNOSIS — Z955 Presence of coronary angioplasty implant and graft: Secondary | ICD-10-CM | POA: Diagnosis not present

## 2018-05-29 DIAGNOSIS — F17220 Nicotine dependence, chewing tobacco, uncomplicated: Secondary | ICD-10-CM | POA: Diagnosis present

## 2018-05-29 DIAGNOSIS — E119 Type 2 diabetes mellitus without complications: Secondary | ICD-10-CM | POA: Diagnosis present

## 2018-05-29 DIAGNOSIS — R778 Other specified abnormalities of plasma proteins: Secondary | ICD-10-CM

## 2018-05-29 DIAGNOSIS — J441 Chronic obstructive pulmonary disease with (acute) exacerbation: Secondary | ICD-10-CM | POA: Diagnosis not present

## 2018-05-29 DIAGNOSIS — I509 Heart failure, unspecified: Secondary | ICD-10-CM | POA: Diagnosis not present

## 2018-05-29 DIAGNOSIS — R0902 Hypoxemia: Secondary | ICD-10-CM | POA: Diagnosis not present

## 2018-05-29 DIAGNOSIS — Z7951 Long term (current) use of inhaled steroids: Secondary | ICD-10-CM

## 2018-05-29 DIAGNOSIS — Z7984 Long term (current) use of oral hypoglycemic drugs: Secondary | ICD-10-CM | POA: Diagnosis not present

## 2018-05-29 LAB — CBC
HCT: 42.6 % (ref 40.0–52.0)
HEMOGLOBIN: 14.5 g/dL (ref 13.0–18.0)
MCH: 32.7 pg (ref 26.0–34.0)
MCHC: 33.9 g/dL (ref 32.0–36.0)
MCV: 96.2 fL (ref 80.0–100.0)
PLATELETS: 171 10*3/uL (ref 150–440)
RBC: 4.43 MIL/uL (ref 4.40–5.90)
RDW: 15.6 % — ABNORMAL HIGH (ref 11.5–14.5)
WBC: 10.3 10*3/uL (ref 3.8–10.6)

## 2018-05-29 LAB — BASIC METABOLIC PANEL
Anion gap: 7 (ref 5–15)
BUN: 23 mg/dL (ref 8–23)
CALCIUM: 9.1 mg/dL (ref 8.9–10.3)
CO2: 31 mmol/L (ref 22–32)
CREATININE: 1.11 mg/dL (ref 0.61–1.24)
Chloride: 100 mmol/L (ref 98–111)
GFR calc Af Amer: >60 mL/min (ref >60)
GFR, EST NON AFRICAN AMERICAN: 59 mL/min — AB (ref >60)
Glucose, Bld: 234 mg/dL — ABNORMAL HIGH (ref 70–99)
Potassium: 4.4 mmol/L (ref 3.5–5.1)
SODIUM: 138 mmol/L (ref 135–145)

## 2018-05-29 LAB — BRAIN NATRIURETIC PEPTIDE: B NATRIURETIC PEPTIDE 5: 308 pg/mL — AB (ref 0.0–100.0)

## 2018-05-29 LAB — BLOOD GAS, ARTERIAL
Acid-Base Excess: 2.6 mmol/L — ABNORMAL HIGH (ref 0.0–2.0)
Bicarbonate: 28.9 mmol/L — ABNORMAL HIGH (ref 20.0–28.0)
DELIVERY SYSTEMS: POSITIVE
Expiratory PAP: 5
FIO2: 0.44
INSPIRATORY PAP: 10
O2 Saturation: 97.9 %
PCO2 ART: 50 mmHg — AB (ref 32.0–48.0)
PH ART: 7.37 (ref 7.350–7.450)
Patient temperature: 37
pO2, Arterial: 106 mmHg (ref 83.0–108.0)

## 2018-05-29 LAB — GLUCOSE, CAPILLARY
Glucose-Capillary: 433 mg/dL — ABNORMAL HIGH (ref 70–99)
Glucose-Capillary: 459 mg/dL — ABNORMAL HIGH (ref 70–99)
Glucose-Capillary: 477 mg/dL — ABNORMAL HIGH (ref 70–99)
Glucose-Capillary: 325 mg/dL — ABNORMAL HIGH (ref 70–99)

## 2018-05-29 LAB — TSH: TSH: 2.21 u[IU]/mL (ref 0.350–4.500)

## 2018-05-29 LAB — TROPONIN I: TROPONIN I: 0.03 ng/mL — AB (ref <0.03)

## 2018-05-29 MED ORDER — DOCUSATE SODIUM 100 MG PO CAPS
100.0000 mg | ORAL_CAPSULE | Freq: Two times a day (BID) | ORAL | Status: DC
  Administered 2018-05-30: 100 mg via ORAL
  Filled 2018-05-29 (×3): qty 1

## 2018-05-29 MED ORDER — INSULIN ASPART 100 UNIT/ML ~~LOC~~ SOLN
15.0000 [IU] | Freq: Once | SUBCUTANEOUS | Status: AC
  Administered 2018-05-29: 15 [IU] via SUBCUTANEOUS

## 2018-05-29 MED ORDER — MOMETASONE FURO-FORMOTEROL FUM 200-5 MCG/ACT IN AERO
2.0000 | INHALATION_SPRAY | Freq: Two times a day (BID) | RESPIRATORY_TRACT | Status: DC
  Administered 2018-05-29 – 2018-05-30 (×3): 2 via RESPIRATORY_TRACT
  Filled 2018-05-29: qty 8.8

## 2018-05-29 MED ORDER — METHYLPREDNISOLONE SODIUM SUCC 125 MG IJ SOLR
125.0000 mg | Freq: Once | INTRAMUSCULAR | Status: AC
  Administered 2018-05-29: 125 mg via INTRAVENOUS
  Filled 2018-05-29: qty 2

## 2018-05-29 MED ORDER — PREDNISONE 50 MG PO TABS
50.0000 mg | ORAL_TABLET | Freq: Every day | ORAL | Status: DC
  Administered 2018-05-30: 50 mg via ORAL
  Filled 2018-05-29 (×3): qty 1

## 2018-05-29 MED ORDER — IPRATROPIUM-ALBUTEROL 0.5-2.5 (3) MG/3ML IN SOLN
3.0000 mL | Freq: Once | RESPIRATORY_TRACT | Status: AC
  Administered 2018-05-29: 3 mL via RESPIRATORY_TRACT

## 2018-05-29 MED ORDER — ENOXAPARIN SODIUM 40 MG/0.4ML ~~LOC~~ SOLN
40.0000 mg | SUBCUTANEOUS | Status: DC
  Administered 2018-05-29: 40 mg via SUBCUTANEOUS
  Filled 2018-05-29: qty 0.4

## 2018-05-29 MED ORDER — METFORMIN HCL 500 MG PO TABS
1000.0000 mg | ORAL_TABLET | Freq: Two times a day (BID) | ORAL | Status: DC
  Administered 2018-05-29 – 2018-05-30 (×2): 1000 mg via ORAL
  Filled 2018-05-29 (×2): qty 2

## 2018-05-29 MED ORDER — TIOTROPIUM BROMIDE MONOHYDRATE 18 MCG IN CAPS
18.0000 ug | ORAL_CAPSULE | Freq: Every day | RESPIRATORY_TRACT | Status: DC
  Administered 2018-05-29 – 2018-05-30 (×2): 18 ug via RESPIRATORY_TRACT
  Filled 2018-05-29: qty 5

## 2018-05-29 MED ORDER — KETOROLAC TROMETHAMINE 0.5 % OP SOLN
1.0000 [drp] | Freq: Three times a day (TID) | OPHTHALMIC | Status: DC
  Administered 2018-05-29: 1 [drp] via OPHTHALMIC
  Filled 2018-05-29: qty 3

## 2018-05-29 MED ORDER — ONDANSETRON HCL 4 MG/2ML IJ SOLN: 4.0000 mg | Freq: Four times a day (QID) | INTRAMUSCULAR | Status: DC | PRN

## 2018-05-29 MED ORDER — ASPIRIN EC 81 MG PO TBEC
81.0000 mg | DELAYED_RELEASE_TABLET | Freq: Every day | ORAL | Status: DC
  Administered 2018-05-29 – 2018-05-30 (×2): 81 mg via ORAL
  Filled 2018-05-29 (×2): qty 1

## 2018-05-29 MED ORDER — IPRATROPIUM-ALBUTEROL 0.5-2.5 (3) MG/3ML IN SOLN
3.0000 mL | Freq: Four times a day (QID) | RESPIRATORY_TRACT | Status: DC
  Administered 2018-05-29 – 2018-05-30 (×2): 3 mL via RESPIRATORY_TRACT
  Filled 2018-05-29 (×3): qty 3

## 2018-05-29 MED ORDER — ACETAMINOPHEN 325 MG PO TABS: 650.0000 mg | ORAL_TABLET | Freq: Four times a day (QID) | ORAL | Status: DC | PRN

## 2018-05-29 MED ORDER — ONDANSETRON HCL 4 MG PO TABS: 4.0000 mg | ORAL_TABLET | Freq: Four times a day (QID) | ORAL | Status: DC | PRN

## 2018-05-29 MED ORDER — INSULIN ASPART 100 UNIT/ML ~~LOC~~ SOLN
0.0000 [IU] | Freq: Every day | SUBCUTANEOUS | Status: DC
  Administered 2018-05-29: 4 [IU] via SUBCUTANEOUS
  Filled 2018-05-29: qty 1

## 2018-05-29 MED ORDER — IPRATROPIUM-ALBUTEROL 0.5-2.5 (3) MG/3ML IN SOLN
RESPIRATORY_TRACT | Status: AC
  Administered 2018-05-29: 3 mL via RESPIRATORY_TRACT
  Filled 2018-05-29: qty 9

## 2018-05-29 MED ORDER — LINAGLIPTIN 5 MG PO TABS
5.0000 mg | ORAL_TABLET | Freq: Every day | ORAL | Status: DC
  Administered 2018-05-29 – 2018-05-30 (×2): 5 mg via ORAL
  Filled 2018-05-29 (×2): qty 1

## 2018-05-29 MED ORDER — INSULIN ASPART 100 UNIT/ML ~~LOC~~ SOLN
15.0000 [IU] | Freq: Once | SUBCUTANEOUS | Status: AC
  Administered 2018-05-29: 15 [IU] via SUBCUTANEOUS
  Filled 2018-05-29: qty 1

## 2018-05-29 MED ORDER — IPRATROPIUM-ALBUTEROL 0.5-2.5 (3) MG/3ML IN SOLN
3.0000 mL | RESPIRATORY_TRACT | Status: DC
  Administered 2018-05-29 (×3): 3 mL via RESPIRATORY_TRACT
  Filled 2018-05-29 (×3): qty 3

## 2018-05-29 MED ORDER — GLIMEPIRIDE 1 MG PO TABS
4.0000 mg | ORAL_TABLET | Freq: Two times a day (BID) | ORAL | Status: DC
  Administered 2018-05-29 – 2018-05-30 (×2): 4 mg via ORAL
  Filled 2018-05-29 (×3): qty 4

## 2018-05-29 MED ORDER — INSULIN ASPART 100 UNIT/ML ~~LOC~~ SOLN
0.0000 [IU] | Freq: Three times a day (TID) | SUBCUTANEOUS | Status: DC
  Administered 2018-05-30: 3 [IU] via SUBCUTANEOUS
  Filled 2018-05-29 (×2): qty 1

## 2018-05-29 MED ORDER — ACETAMINOPHEN 650 MG RE SUPP: 650.0000 mg | Freq: Four times a day (QID) | RECTAL | Status: DC | PRN

## 2018-05-29 MED ORDER — ALBUTEROL SULFATE (2.5 MG/3ML) 0.083% IN NEBU: 2.5000 mg | INHALATION_SOLUTION | RESPIRATORY_TRACT | Status: DC | PRN

## 2018-05-29 NOTE — ED Notes (Signed)
Attempted to call report at this time. Per Serina Cowper, RN, pt is not appropriate for 2A-Telemetry d/t admission orders placed for continuous Bi-PAP and states the pt would need to be placed on Step-Down. Dr Sheryle Hail paged at this time to verify orders/bed placement.

## 2018-05-29 NOTE — Care Management Note (Signed)
Case Management Note  Patient Details  Name: SHAQUEL CHAVOUS MRN: [id number] Date of Birth: 01/13/1931  Subjective/Objective:   Patient from home with COPD exacerbation.  Lives with his wife.  Independent.  On Chronic O2 at home through [facility].  IV solu-medrol and nebs continued today.  Elevated BNP.  Current with PCP, Dr. Hyacinth Meeker.  No difficulties obtaining medications or with transportation.   Informed patient to be sure to have home O2 when ready for discharge.           Action/Plan:No needs identified at this time.  MOON letter presented.    Expected Discharge Date:                  Expected Discharge Plan:  Home/Self Care  In-House Referral:     Discharge planning Services  CM Consult  Post Acute Care Choice:    Choice offered to:     DME Arranged:    DME Agency:  [facility](Chronic O2)  HH Arranged:    HH Agency:     Status of Service:  In process, will continue to follow  If discussed at Long Length of Stay Meetings, dates discussed:    Additional Comments:  Sherren Kerns, RN 05/29/2018, 3:58 PM

## 2018-05-29 NOTE — H&P (Signed)
George Hensley is an 84 y.o. male.   Chief Complaint: Shortness of breath HPI: The patient with past medical history of COPD and bronchiectasis as well as diabetes presents to the emergency department complaining of shortness of breath.  The patient's wife states that his dyspnea seem to hit him very rapidly tonight.  It was unrelieved by increasing supplemental oxygen which he uses all the time at 2 L/min.  Due to his increased work of breathing the emergency department staff started BiPAP.  The patient has denied chest pain.  Due to continued respiratory distress the emergency department staff called the hospitalist service for admission  Past Medical History:  Diagnosis Date  . Asthma   . Bronchiectasis ([place])   . COPD (chronic obstructive pulmonary disease) ([place])   . Diabetes mellitus     Past Surgical History:  Procedure Laterality Date  . CHOLECYSTECTOMY  2000  . CORONARY STENT PLACEMENT  1999    Family History  Problem Relation Age of Onset  . Asthma Brother    Social History:  reports that he has never smoked. His smokeless tobacco use includes chew. He reports that he drinks alcohol. He reports that he does not use drugs.  Allergies:  Allergies  Allergen Reactions  . Metoprolol     Pt states syncope with lopressor    Prior to Admission medications   Medication Sig Start Date End Date Taking? Authorizing Provider  aspirin EC 81 MG tablet Take 81 mg by mouth daily.  09/22/12   [provider]  Cholecalciferol (VITAMIN D-1000 MAX ST) 1000 units tablet Take 1 tablet by mouth 3 (three) times a week.    [provider]  glimepiride (AMARYL) 4 MG tablet Take 4 mg by mouth 2 (two) times daily.    [provider]  ketorolac (ACULAR) 0.5 % ophthalmic solution Place 1 drop into the left eye 3 (three) times daily. 10/05/17   [provider]  metFORMIN (GLUCOPHAGE) 1000 MG tablet Take 1,000 mg by mouth 2 (two) times daily.    [provider]   mometasone-formoterol (DULERA) 200-5 MCG/ACT AERO Inhale 2 puffs into the lungs 2 (two) times daily. 06/27/16   Hillary Bow, MD  ofloxacin (OCUFLOX) 0.3 % ophthalmic solution Place 1 drop into the left eye 3 (three) times daily. 10/05/17   [provider]  prednisoLONE acetate (PRED FORTE) 1 % ophthalmic suspension Place 1 drop into the left eye 3 (three) times daily. 10/05/17   [provider]  predniSONE (STERAPRED UNI-PAK 21 TAB) 10 MG (21) TBPK tablet Taper by 10 mg daily 10/14/17   Epifanio Lesches, MD  sitaGLIPtin (JANUVIA) 100 MG tablet Take 1 tablet by mouth daily.    [provider]  tiotropium (SPIRIVA) 18 MCG inhalation capsule Place 18 mcg into inhaler and inhale daily.    [provider]  VENTOLIN HFA 108 (90 Base) MCG/ACT inhaler Inhale 1 puff into the lungs every 6 (six) hours as needed. 06/25/16   [provider]     Results for orders placed or performed during the hospital encounter of 05/29/18 (from the past 48 hour(s))  Basic metabolic panel     Status: Abnormal   Collection Time: 05/29/18  3:31 AM  Result Value Ref Range   Sodium 138 135 - 145 mmol/L   Potassium 4.4 3.5 - 5.1 mmol/L   Chloride 100 98 - 111 mmol/L   CO2 31 22 - 32 mmol/L   Glucose, Bld 234 (H) 70 - 99  mg/dL   BUN 23 8 - 23 mg/dL   Creatinine, Ser 1.11 0.61 - 1.24 mg/dL   Calcium 9.1 8.9 - 10.3 mg/dL   GFR calc non Af Amer 59 (L) >60 mL/min   GFR calc Af Amer >60 >60 mL/min    Comment: (NOTE) The eGFR has been calculated using the CKD EPI equation. This calculation has not been validated in all clinical situations. eGFR's persistently <60 mL/min signify possible Chronic Kidney Disease.    Anion gap 7 5 - 15    Comment: Performed at [facility], [address] [place], [place] [address]  CBC     Status: Abnormal   Collection Time: 05/29/18  3:31 AM  Result Value Ref Range   WBC 10.3 3.8 - 10.6 K/uL   RBC 4.43 4.40 - 5.90 MIL/uL    Hemoglobin 14.5 13.0 - 18.0 g/dL   HCT 42.6 40.0 - 52.0 %   MCV 96.2 80.0 - 100.0 fL   MCH 32.7 26.0 - 34.0 pg   MCHC 33.9 32.0 - 36.0 g/dL   RDW 15.6 (H) 11.5 - 14.5 %   Platelets 171 150 - 440 K/uL    Comment: Performed at [facility], [address] [place], [place] [address]  Troponin I     Status: Abnormal   Collection Time: 05/29/18  3:31 AM  Result Value Ref Range   Troponin I 0.03 (HH) <0.03 ng/mL    Comment: CRITICAL RESULT CALLED TO, READ BACK BY AND VERIFIED WITH BUTCH WOODS '@0410'$  05/29/18 [facility] Performed at [facility], [address], [place], [place] [address]   Brain natriuretic peptide     Status: Abnormal   Collection Time: 05/29/18  3:31 AM  Result Value Ref Range   B Natriuretic Peptide 308.0 (H) 0.0 - 100.0 pg/mL    Comment: Performed at [facility], [address] [place], [place] [address]  Blood gas, arterial     Status: Abnormal   Collection Time: 05/29/18  3:38 AM  Result Value Ref Range   FIO2 0.44    Delivery systems BILEVEL POSITIVE AIRWAY PRESSURE    Inspiratory PAP 10    Expiratory PAP 5    pH, Arterial 7.37 7.350 - 7.450   pCO2 arterial 50 (H) 32.0 - 48.0 mmHg   pO2, Arterial 106 83.0 - 108.0 mmHg   Bicarbonate 28.9 (H) 20.0 - 28.0 mmol/L   Acid-Base Excess 2.6 (H) 0.0 - 2.0 mmol/L   O2 Saturation 97.9 %   Patient temperature 37.0    Collection site LEFT BRACHIAL    Sample type ARTERIAL DRAW    Allens test (pass/fail) PASS PASS    Comment: Performed at [facility], [address] [place],  [address]   Dg Chest Port 1 View  Result Date: 05/29/2018 CLINICAL DATA:  84 y/o  M; respiratory distress. EXAM: PORTABLE CHEST 1 VIEW COMPARISON:  10/12/2017 chest radiograph. FINDINGS: Stable heart size and mediastinal contours are within normal limits. Both lungs are clear. The visualized skeletal structures are unremarkable. IMPRESSION: No active disease. Electronically Signed   By: Kristine Garbe M.D.   On: 05/29/2018 04:11    Review of Systems  Constitutional: Negative for chills and fever.  HENT: Negative for sore throat and tinnitus.   Eyes: Negative for blurred vision and redness.  Respiratory: Positive for shortness of breath. Negative for cough.   Cardiovascular: Negative for chest pain, palpitations, orthopnea and PND.  Gastrointestinal: Negative for abdominal pain, diarrhea,  nausea and vomiting.  Genitourinary: Negative for dysuria, frequency and urgency.  Musculoskeletal: Negative for joint pain and myalgias.  Skin: Negative for rash.       No lesions  Neurological: Negative for speech change, focal weakness and weakness.  Endo/Heme/Allergies: Does not bruise/bleed easily.       No temperature intolerance  Psychiatric/Behavioral: Negative for depression and suicidal ideas.    Blood pressure 116/71, pulse 84, temperature 97.7 F (36.5 C), temperature source Oral, resp. rate 18, height '5\' 10"'$  (1.778 m), weight 76.2 kg, SpO2 95 %. Physical Exam  Vitals reviewed. Constitutional: He is oriented to person, place, and time. He appears well-developed and well-nourished. No distress. Face mask in place.  HENT:  Head: Normocephalic and atraumatic.  Mouth/Throat: Oropharynx is clear and moist.  Eyes: Pupils are equal, round, and reactive to light. Conjunctivae and EOM are normal. No scleral icterus.  Neck: Normal range of motion. Neck supple. No JVD present. No tracheal deviation present. No thyromegaly present.  Cardiovascular: Normal rate, regular rhythm and normal heart sounds. Exam reveals no gallop and no friction rub.  No murmur heard. Respiratory: Breath sounds normal. No respiratory distress.  BiPAP  GI: Soft. Bowel sounds are normal. He exhibits no distension. There is no tenderness.  Genitourinary:  Genitourinary Comments: Deferred  Musculoskeletal: Normal range of motion. He exhibits no edema.  Lymphadenopathy:    He has no cervical  adenopathy.  Neurological: He is alert and oriented to person, place, and time. No cranial nerve deficit.  Skin: Skin is warm and dry. No rash noted. No erythema.  Psychiatric: He has a normal mood and affect. His behavior is normal. Judgment and thought content normal.     Assessment/Plan This is an 84 year old male admitted for respiratory distress. 1.  Respiratory distress: Acute on chronic respiratory failure; mild hypercapnia present. The patient has history of COPD and bronchiectasis.  Respiratory rate work of breathing significantly improved on BiPAP.  We may discontinue noninvasive ventilation for trial of submental oxygen via nasal cannula as the patient is on at home. 2.  COPD: Likely exacerbation.  Given Solu-Medrol 125 mg in the emergency department.  Continue steroids at 50 mg/day until primary team decides on taper regimen.  Also continue Spiriva and inhaled corticosteroid. Schedule albuterol. 3.  CHF: BNP elevated but no lower extremity edema or pulmonary edema.  Unclear if systolic or diastolic.  May be temporary failure.  Consider echocardiogram and cardiology consult if improvement in respiratory status does not also improve fluid balance. 4.  Diabetes mellitus type 2: Hold oral hypoglycemic medications.  Sliding scale insulin while hospitalized.  May continue GLP-1 inhibitor 5.  DVT prophylaxis: Lovenox 6.  GI prophylaxis: None The patient is a full code.  Time spent on admission orders and patient care approximately 45 minutes  Harrie Foreman, MD 05/29/2018, 7:19 AM

## 2018-05-29 NOTE — Care Management Obs Status (Signed)
MEDICARE OBSERVATION STATUS NOTIFICATION   Patient Details  Name: STORMY SABOL MRN: [id number] Date of Birth: 23-Apr-1931   Medicare Observation Status Notification Given:  Yes    Sherren Kerns, RN 05/29/2018, 4:21 PM

## 2018-05-29 NOTE — Progress Notes (Signed)
Family Meeting Note  Advance Directive:yes  Today a meeting took place with the Patient.  The following clinical team members were present during this meeting:MD  The following were discussed:Patient's diagnosis: COPD and chronic respiratory failure, Patient's progosis: Unable to determine and Goals for treatment: DNR  Additional follow-up to be provided: PMD  Time spent during discussion:20 minutes  Altamese Dilling, MD

## 2018-05-29 NOTE — Progress Notes (Signed)
CRITICAL VALUE ALERT  Critical Value:  CBG 477  Date & Time Notied:  05/29/18.1650  Provider Notified: Dr. Elisabeth Pigeon   Orders Received/Actions taken: 15 units of novolog.. Will continue to monitor

## 2018-05-29 NOTE — ED Notes (Signed)
Pt removed from Bi-PAP and placed on 4L O2 via [place] at this time by RT.

## 2018-05-29 NOTE — Progress Notes (Signed)
CRITICAL VALUE ALERT  Critical Value:  CBG 443  Date & Time Notied:  05/29/18 1030  Provider Notified: Dr. Esaw Grandchild   Orders Received/Actions taken:  See MAR. Will continue to monitor,

## 2018-05-29 NOTE — ED Triage Notes (Addendum)
Pt to triage via w/c, audible rhales, prolonged exp; Pt reports SHOB, prod cough yellow sputum since last night; st hx COPD, denies pain; pt has O2 in place at 2l/min via [place] (from home)

## 2018-05-29 NOTE — ED Provider Notes (Signed)
[facility] Emergency Department Provider Note   ____________________________________________   First MD Initiated Contact with Patient 05/29/18 0330     (approximate)  I have reviewed the triage vital signs and the nursing notes.   HISTORY  Chief Complaint Shortness of Breath    HPI George Hensley is a 84 y.o. male who presents to the ED from home with a chief complaint of respiratory distress.  Patient has a history of COPD, bronchiectasis who uses 2 L nasal cannula continuous oxygen.  Complains of progressive shortness of breath over the course of the night.  Has been unable to sleep secondary to shortness of breath.  Presents with audible wheezing and tachypnea.  Denies associated fever, chills, chest pain, abdominal pain, nausea or vomiting.  Denies recent travel or trauma.   Past Medical History:  Diagnosis Date  . Asthma   . Bronchiectasis (HCC)   . COPD (chronic obstructive pulmonary disease) (HCC)   . Diabetes mellitus     Patient Active Problem List   Diagnosis Date Noted  . Acute on chronic respiratory failure with hypoxia and hypercapnia (HCC) 05/29/2018  . CAP (community acquired pneumonia) 10/12/2017  . Dyspnea 06/26/2016  . Asthma exacerbation 06/26/2016  . Hypoxia 06/12/2016  . Acute on chronic respiratory failure with hypoxia (HCC) 08/02/2015  . Asthma 04/04/2011  . Pulmonary nodule 03/06/2011    Past Surgical History:  Procedure Laterality Date  . CHOLECYSTECTOMY  2000  . CORONARY STENT PLACEMENT  1999    Prior to Admission medications   Medication Sig Start Date End Date Taking? Authorizing Provider  aspirin EC 81 MG tablet Take 81 mg by mouth daily.  09/22/12   [provider]  Cholecalciferol (VITAMIN D-1000 MAX ST) 1000 units tablet Take 1 tablet by mouth 3 (three) times a week.    [provider]  glimepiride (AMARYL) 4 MG tablet Take 4 mg by mouth 2 (two) times daily.    [provider]    ketorolac (ACULAR) 0.5 % ophthalmic solution Place 1 drop into the left eye 3 (three) times daily. 10/05/17   [provider]  metFORMIN (GLUCOPHAGE) 1000 MG tablet Take 1,000 mg by mouth 2 (two) times daily.    [provider]  mometasone-formoterol (DULERA) 200-5 MCG/ACT AERO Inhale 2 puffs into the lungs 2 (two) times daily. 06/27/16   Milagros Loll, MD  ofloxacin (OCUFLOX) 0.3 % ophthalmic solution Place 1 drop into the left eye 3 (three) times daily. 10/05/17   [provider]  prednisoLONE acetate (PRED FORTE) 1 % ophthalmic suspension Place 1 drop into the left eye 3 (three) times daily. 10/05/17   [provider]  predniSONE (STERAPRED UNI-PAK 21 TAB) 10 MG (21) TBPK tablet Taper by 10 mg daily 10/14/17   Katha Hamming, MD  sitaGLIPtin (JANUVIA) 100 MG tablet Take 1 tablet by mouth daily.    [provider]  tiotropium (SPIRIVA) 18 MCG inhalation capsule Place 18 mcg into inhaler and inhale daily.    [provider]  VENTOLIN HFA 108 (90 Base) MCG/ACT inhaler Inhale 1 puff into the lungs every 6 (six) hours as needed. 06/25/16   [provider]    Allergies Metoprolol  Family History  Problem Relation Age of Onset  . Asthma Brother     Social History Social History   Tobacco Use  . Smoking status: Never Smoker  . Smokeless tobacco: Current User    Types: Chew  Substance Use Topics  . Alcohol  use: Yes    Comment: socially last drink 2 weeks ago  . Drug use: No    Review of Systems  Constitutional: No fever/chills Eyes: No visual changes. ENT: No sore throat. Cardiovascular: Denies chest pain. Respiratory: Positive for shortness of breath. Gastrointestinal: No abdominal pain.  No nausea, no vomiting.  No diarrhea.  No constipation. Genitourinary: Negative for dysuria. Musculoskeletal: Negative for back pain. Skin: Negative for rash. Neurological: Negative for headaches, focal weakness or  numbness.   ____________________________________________   PHYSICAL EXAM:  VITAL SIGNS: ED Triage Vitals  Enc Vitals Group     BP 05/29/18 0319 (!) 159/75     Pulse Rate 05/29/18 0319 90     Resp --      Temp 05/29/18 0319 97.7 F (36.5 C)     Temp Source 05/29/18 0319 Oral     SpO2 05/29/18 0319 90 %     Weight 05/29/18 0318 168 lb (76.2 kg)     Height 05/29/18 0318 5\' 10"  (1.778 m)     Head Circumference --      Peak Flow --      Pain Score 05/29/18 0318 0     Pain Loc --      Pain Edu? --      Excl. in GC? --     Constitutional: Alert and oriented.  Ill appearing and in moderate acute distress. Eyes: Conjunctivae are normal. PERRL. EOMI. Head: Atraumatic. Nose: No congestion/rhinnorhea. Mouth/Throat: Mucous membranes are moist.  Oropharynx non-erythematous. Neck: No stridor.   Cardiovascular: Normal rate, regular rhythm. Grossly normal heart sounds.  Good peripheral circulation. Respiratory: Increased respiratory effort.  Retractions. Lungs with audible wheezing. Gastrointestinal: Soft and nontender. No distention. No abdominal bruits. No CVA tenderness. Musculoskeletal: No lower extremity tenderness nor edema.  No joint effusions. Neurologic:  Normal speech and language. No gross focal neurologic deficits are appreciated.  Skin:  Skin is warm, dry and intact. No rash noted. Psychiatric: Mood and affect are normal. Speech and behavior are normal.  ____________________________________________   LABS (all labs ordered are listed, but only abnormal results are displayed)  Labs Reviewed  BASIC METABOLIC PANEL - Abnormal; Notable for the following components:      Result Value   Glucose, Bld 234 (*)    GFR calc non Af Amer 59 (*)    All other components within normal limits  CBC - Abnormal; Notable for the following components:   RDW 15.6 (*)    All other components within normal limits  TROPONIN I - Abnormal; Notable for the following components:   Troponin I  0.03 (*)    All other components within normal limits  BRAIN NATRIURETIC PEPTIDE - Abnormal; Notable for the following components:   B Natriuretic Peptide 308.0 (*)    All other components within normal limits  BLOOD GAS, ARTERIAL - Abnormal; Notable for the following components:   pCO2 arterial 50 (*)    Bicarbonate 28.9 (*)    Acid-Base Excess 2.6 (*)    All other components within normal limits   ____________________________________________  EKG  ED ECG REPORT I, SUNG,JADE J, the attending physician, personally viewed and interpreted this ECG.   Date: 05/29/2018  EKG Time: 0326  Rate: 91  Rhythm: normal EKG, normal sinus rhythm  Axis: LAD  Intervals:none  ST&T Change: Nonspecific  ____________________________________________  RADIOLOGY  ED MD interpretation: No acute cardiopulmonary process  Official radiology report(s): Dg Chest Port 1 View  Result Date: 05/29/2018 CLINICAL DATA:  84  y/o  M; respiratory distress. EXAM: PORTABLE CHEST 1 VIEW COMPARISON:  10/12/2017 chest radiograph. FINDINGS: Stable heart size and mediastinal contours are within normal limits. Both lungs are clear. The visualized skeletal structures are unremarkable. IMPRESSION: No active disease. Electronically Signed   By: Mitzi Hansen M.D.   On: 05/29/2018 04:11    ____________________________________________   PROCEDURES  Procedure(s) performed: None  Procedures  Critical Care performed: Yes, see critical care note(s)   CRITICAL CARE Performed by: Irean Hong   Total critical care time: 45 minutes  Critical care time was exclusive of separately billable procedures and treating other patients.  Critical care was necessary to treat or prevent imminent or life-threatening deterioration.  Critical care was time spent personally by me on the following activities: development of treatment plan with patient and/or surrogate as well as nursing, discussions with consultants,  evaluation of patient's response to treatment, examination of patient, obtaining history from patient or surrogate, ordering and performing treatments and interventions, ordering and review of laboratory studies, ordering and review of radiographic studies, pulse oximetry and re-evaluation of patient's condition.  ____________________________________________   INITIAL IMPRESSION / ASSESSMENT AND PLAN / ED COURSE  As part of my medical decision making, I reviewed the following data within the electronic MEDICAL RECORD NUMBER History obtained from family, Nursing notes reviewed and incorporated, Labs reviewed, EKG interpreted, Old chart reviewed, Radiograph reviewed, Discussed with admitting physician and Notes from prior ED visits   84 year old male with COPD and bronchiectasis on continuous oxygenation who presents with respiratory distress. Differential includes, but is not limited to, viral syndrome, bronchitis including COPD exacerbation, pneumonia, reactive airway disease including asthma, CHF including exacerbation with or without pulmonary/interstitial edema, pneumothorax, ACS, thoracic trauma, and pulmonary embolism.  3 DuoNeb's and 125 mg IV Solu-Medrol initiated upon patient's arrival to the treatment room.  Will trial BiPAP.  Will obtain screening lab work and chest x-ray.  Anticipate hospitalization.  Clinical Course as of May 29 618  Thu May 29, 2018  4098 Patient appears much more comfortable on BiPAP.  Updated patient and spouse on all test results.  Will discuss with hospitalist to evaluate patient in the emergency department for admission.   [JS]    Clinical Course User Index [JS] Irean Hong, MD     ____________________________________________   FINAL CLINICAL IMPRESSION(S) / ED DIAGNOSES  Final diagnoses:  COPD exacerbation (HCC)  Respiratory distress  Hypoxia  Elevated troponin     ED Discharge Orders    None       Note:  This document was prepared using  Dragon voice recognition software and may include unintentional dictation errors.    Irean Hong, MD 05/29/18 [phone]

## 2018-05-29 NOTE — Progress Notes (Signed)
SATURATION QUALIFICATIONS: (This note is used to comply with regulatory documentation for home oxygen)  Patient Saturations on Room Air at Rest = 96%  Patient Saturations on Room Air while Ambulating = 95%  Patient Saturations on n/a Liters of oxygen while Ambulating = n/a%  Please briefly explain why patient needs home oxygen: Per Patient use home O2 PRN, Its through [facility]

## 2018-05-29 NOTE — ED Notes (Signed)
Called floor nurse to give updates before sending pt up. Wife made aware of new room number.

## 2018-05-29 NOTE — Progress Notes (Signed)
[employer] [facility] [place] [facility]   PATIENT NAME: Arlon Bleier    MR#:  [id number]  DATE OF BIRTH:  [birth year]  SUBJECTIVE:  CHIEF COMPLAINT:   Chief Complaint  Patient presents with  . Shortness of Breath  Came with shortness of breath and respiratory distress and initially required BiPAP and but improved.  On baseline home oxygen 2 L.  Feels better now.  REVIEW OF SYSTEMS:  CONSTITUTIONAL: No fever, fatigue or weakness.  EYES: No blurred or double vision.  EARS, NOSE, AND THROAT: No tinnitus or ear pain.  RESPIRATORY: No cough, have shortness of breath, wheezing or hemoptysis.  CARDIOVASCULAR: No chest pain, orthopnea, edema.  GASTROINTESTINAL: No nausea, vomiting, diarrhea or abdominal pain.  GENITOURINARY: No dysuria, hematuria.  ENDOCRINE: No polyuria, nocturia,  HEMATOLOGY: No anemia, easy bruising or bleeding SKIN: No rash or lesion. MUSCULOSKELETAL: No joint pain or arthritis.   NEUROLOGIC: No tingling, numbness, weakness.  PSYCHIATRY: No anxiety or depression.   ROS  DRUG ALLERGIES:   Allergies  Allergen Reactions  . Metoprolol     Pt states syncope with lopressor    VITALS:  Blood pressure 121/64, pulse 87, temperature 97.6 F (36.4 C), temperature source Oral, resp. rate 19, height 5\' 10"  (1.778 m), weight 73.3 kg, SpO2 99 %.  PHYSICAL EXAMINATION:  GENERAL:  84 y.o.-year-old patient lying in the bed with no acute distress.  EYES: Pupils equal, round, reactive to light and accommodation. No scleral icterus. Extraocular muscles intact.  HEENT: Head atraumatic, normocephalic. Oropharynx and nasopharynx clear.  NECK:  Supple, no jugular venous distention. No thyroid enlargement, no tenderness.  LUNGS: Normal breath sounds bilaterally, some wheezing, no crepitation. No use of accessory muscles of respiration.  CARDIOVASCULAR: S1, S2 normal. No murmurs, rubs, or gallops.  ABDOMEN: Soft, nontender, nondistended. Bowel sounds present. No  organomegaly or mass.  EXTREMITIES: No pedal edema, cyanosis, or clubbing.  NEUROLOGIC: Cranial nerves II through XII are intact. Muscle strength 5/5 in all extremities. Sensation intact. Gait not checked.  PSYCHIATRIC: The patient is alert and oriented x 3.  SKIN: No obvious rash, lesion, or ulcer.   Physical Exam LABORATORY PANEL:   CBC Recent Labs  Lab 05/29/18 0331  WBC 10.3  HGB 14.5  HCT 42.6  PLT 171   ------------------------------------------------------------------------------------------------------------------  Chemistries  Recent Labs  Lab 05/29/18 0331  NA 138  K 4.4  CL 100  CO2 31  GLUCOSE 234*  BUN 23  CREATININE 1.11  CALCIUM 9.1   ------------------------------------------------------------------------------------------------------------------  Cardiac Enzymes Recent Labs  Lab 05/29/18 0331  TROPONINI 0.03*   ------------------------------------------------------------------------------------------------------------------  RADIOLOGY:  Dg Chest Port 1 View  Result Date: 05/29/2018 CLINICAL DATA:  84 y/o  M; respiratory distress. EXAM: PORTABLE CHEST 1 VIEW COMPARISON:  10/12/2017 chest radiograph. FINDINGS: Stable heart size and mediastinal contours are within normal limits. Both lungs are clear. The visualized skeletal structures are unremarkable. IMPRESSION: No active disease. Electronically Signed   By: Mitzi Hansen M.D.   On: 05/29/2018 04:11    ASSESSMENT AND PLAN:   Active Problems:   Acute on chronic respiratory failure with hypoxia and hypercapnia [facility])  This is an 84 year old male admitted for respiratory distress. 1.  Respiratory distress: Acute on chronic respiratory failure; mild hypercapnia present. The patient has history of COPD and bronchiectasis.  Respiratory rate work of breathing significantly improved on BiPAP.   Cont now oxygen via nasal cannula as the patient is on at home.  2.  COPD exacerbation.  Given  Solu-Medrol 125 mg in the emergency department.  Continue steroids at 50 mg/day  continue Spiriva and inhaled corticosteroid. Schedule albuterol. 3.  ch diastolic CHF: BNP elevated but no lower extremity edema or pulmonary edema.  No Echo in system, but currently no exacerbation. 4.  Diabetes mellitus type 2:   Cont home meds and keep on sliding scale coverage. 5.  DVT prophylaxis: Lovenox 6.  GI prophylaxis: None    All the records are reviewed and case discussed with Care Management/Social Workerr. Management plans discussed with the patient, family and they are in agreement.  CODE STATUS: DNR  TOTAL TIME TAKING CARE OF THIS PATIENT: 35 minutes.     POSSIBLE D/C IN 1-2 DAYS, DEPENDING ON CLINICAL CONDITION.   Altamese Dilling M.D on 05/29/2018   Between 7am to 6pm - Pager - [phone]  After 6pm go to [email or web address] - password [email or web address]  [facility] [facility]  Office  [phone]  CC: Primary care physician; Danella Penton, MD  Note: This dictation was prepared with Dragon dictation along with smaller phrase technology. Any transcriptional errors that result from this process are unintentional.

## 2018-05-30 LAB — GLUCOSE, CAPILLARY
Glucose-Capillary: 243 mg/dL — ABNORMAL HIGH (ref 70–99)
Glucose-Capillary: 239 mg/dL — ABNORMAL HIGH (ref 70–99)

## 2018-05-30 LAB — HEMOGLOBIN A1C
Hgb A1c MFr Bld: 9.8 % — ABNORMAL HIGH (ref 4.8–5.6)
Mean Plasma Glucose: 235 mg/dL

## 2018-05-30 MED ORDER — PREDNISONE 10 MG (21) PO TBPK: ORAL_TABLET | ORAL | 0 refills | Status: DC

## 2018-05-30 MED ORDER — INSULIN GLARGINE 100 UNIT/ML ~~LOC~~ SOLN
10.0000 [IU] | Freq: Every day | SUBCUTANEOUS | Status: DC
  Administered 2018-05-30: 10 [IU] via SUBCUTANEOUS
  Filled 2018-05-30: qty 0.1

## 2018-05-30 NOTE — Progress Notes (Signed)
Pt to be discharged today. Iv and tele removed. disch instructions and prescrips given to pt and wife. disch via w.c.

## 2018-05-30 NOTE — Care Management Note (Signed)
Case Management Note  Patient Details  Name: George Hensley MRN: [id number] Date of Birth: 01/23/1931  Subjective/Objective:      Patient discharging today to home with wife.  Has home O2 supplies.  No further needs identified at this time.              Action/Plan:   Expected Discharge Date:  05/30/18               Expected Discharge Plan:     In-House Referral:     Discharge planning Services  CM Consult  Post Acute Care Choice:    Choice offered to:     DME Arranged:    DME Agency:  [employer](Chronic O2)  HH Arranged:    HH Agency:     Status of Service:  Completed, signed off  If discussed at [employer] of Stay Meetings, dates discussed:    Additional Comments:  Sherren Kerns, RN 05/30/2018, 12:29 PM

## 2018-05-30 NOTE — Plan of Care (Signed)
  Problem: Clinical Measurements: Goal: Respiratory complications will improve Outcome: Progressing   

## 2018-06-02 DIAGNOSIS — E119 Type 2 diabetes mellitus without complications: Secondary | ICD-10-CM | POA: Diagnosis not present

## 2018-06-02 DIAGNOSIS — E782 Mixed hyperlipidemia: Secondary | ICD-10-CM | POA: Diagnosis not present

## 2018-06-02 DIAGNOSIS — E538 Deficiency of other specified B group vitamins: Secondary | ICD-10-CM | POA: Diagnosis not present

## 2018-06-02 NOTE — Discharge Summary (Signed)
[facility] [place] [facility]   PATIENT NAME: George Hensley    MR#:  [id number]  DATE OF BIRTH:  [birth year]  DATE OF ADMISSION:  05/29/2018 ADMITTING PHYSICIAN: Arnaldo Natal, MD  DATE OF DISCHARGE: 05/30/2018  1:05 PM  PRIMARY CARE PHYSICIAN: Danella Penton, MD    ADMISSION DIAGNOSIS:  Respiratory distress [R06.03] Hypoxia [R09.02] Elevated troponin [R79.89] COPD exacerbation (HCC) [J44.1]  DISCHARGE DIAGNOSIS:  Active Problems:   Acute on chronic respiratory failure with hypoxia and hypercapnia (HCC)   SECONDARY DIAGNOSIS:   Past Medical History:  Diagnosis Date  . Asthma   . Bronchiectasis (HCC)   . COPD (chronic obstructive pulmonary disease) (HCC)   . Diabetes mellitus     HOSPITAL COURSE:   This is an 84 year old male admitted for respiratory distress. 1. Respiratory distress: Acute on chronic respiratory failure; mild hypercapnia present. The patient has history of COPD and bronchiectasis. Respiratory rate work of breathing significantly improved on BiPAP.  Cont now oxygen via nasal cannula as the patient is on at home.  2. COPD exacerbation. Given Solu-Medrol 125 mg in the emergency department. Continue steroids at 50 mg/day  continue Spiriva and inhaled corticosteroid. Schedule albuterol. 3. ch diastolic CHF: BNP elevated but no lower extremity edema or pulmonary edema. No Echo in system, but currently no exacerbation. 4. Diabetes mellitus type 2:   Cont home meds and keep on sliding scale coverage. 5. DVT prophylaxis: Lovenox 6. GI prophylaxis: None  DISCHARGE CONDITIONS:   Stable.  CONSULTS OBTAINED:    DRUG ALLERGIES:   Allergies  Allergen Reactions  . Metoprolol     Pt states syncope with lopressor    DISCHARGE MEDICATIONS:   Allergies as of 05/30/2018      Reactions   Metoprolol    Pt states syncope with lopressor      Medication List    TAKE these medications   aspirin EC 81 MG  tablet Take 81 mg by mouth daily.   glimepiride 4 MG tablet Commonly known as:  AMARYL Take 4 mg by mouth 2 (two) times daily.   ketorolac 0.5 % ophthalmic solution Commonly known as:  ACULAR Place 1 drop into the left eye 3 (three) times daily.   metFORMIN 1000 MG tablet Commonly known as:  GLUCOPHAGE Take 1,000 mg by mouth 2 (two) times daily.   mometasone-formoterol 200-5 MCG/ACT Aero Commonly known as:  DULERA Inhale 2 puffs into the lungs 2 (two) times daily.   ofloxacin 0.3 % ophthalmic solution Commonly known as:  OCUFLOX Place 1 drop into the left eye 3 (three) times daily.   prednisoLONE acetate 1 % ophthalmic suspension Commonly known as:  PRED FORTE Place 1 drop into the left eye 3 (three) times daily.   predniSONE 10 MG (21) Tbpk tablet Commonly known as:  STERAPRED UNI-PAK 21 TAB Take 6 tabs first day, 5 tab on day 2, then 4 on day 3rd, 3 tabs on day 4th , 2 tab on day 5th, and 1 tab on 6th day.- after that cont 10 mg daily, as was taking at home. What changed:  additional instructions   sitaGLIPtin 100 MG tablet Commonly known as:  JANUVIA Take 1 tablet by mouth daily.   tiotropium 18 MCG inhalation capsule Commonly known as:  SPIRIVA Place 18 mcg into inhaler and inhale daily.   VENTOLIN HFA 108 (90 Base) MCG/ACT inhaler Generic drug:  albuterol Inhale 1 puff into the lungs every 6 (six) hours as needed.  VITAMIN D-1000 MAX ST 1000 units tablet Generic drug:  Cholecalciferol Take 1 tablet by mouth 3 (three) times a week.        DISCHARGE INSTRUCTIONS:    Follow with PMD in 1 week.  If you experience worsening of your admission symptoms, develop shortness of breath, life threatening emergency, suicidal or homicidal thoughts you must seek medical attention immediately by calling 911 or calling your MD immediately  if symptoms less severe.  You Must read complete instructions/literature along with all the possible adverse reactions/side effects  for all the Medicines you take and that have been prescribed to you. Take any new Medicines after you have completely understood and accept all the possible adverse reactions/side effects.   Please note  You were cared for by a hospitalist during your hospital stay. If you have any questions about your discharge medications or the care you received while you were in the hospital after you are discharged, you can call the unit and asked to speak with the hospitalist on call if the hospitalist that took care of you is not available. Once you are discharged, your primary care physician will handle any further medical issues. Please note that NO REFILLS for any discharge medications will be authorized once you are discharged, as it is imperative that you return to your primary care physician (or establish a relationship with a primary care physician if you do not have one) for your aftercare needs so that they can reassess your need for medications and monitor your lab values.    Today   CHIEF COMPLAINT:   Chief Complaint  Patient presents with  . Shortness of Breath    HISTORY OF PRESENT ILLNESS:  George Hensley  is a 84 y.o. male with a known history of COPD and bronchiectasis as well as diabetes presents to the emergency department complaining of shortness of breath.  The patient's wife states that his dyspnea seem to hit him very rapidly tonight.  It was unrelieved by increasing supplemental oxygen which he uses all the time at 2 L/min.  Due to his increased work of breathing the emergency department staff started BiPAP.  The patient has denied chest pain.  Due to continued respiratory distress the emergency department staff called the hospitalist service for admission   VITAL SIGNS:  Blood pressure 138/79, pulse 90, temperature (!) 97.5 F (36.4 C), temperature source Oral, resp. rate 18, height 5\' 10"  (1.778 m), weight 72.3 kg, SpO2 99 %.  I/O:  No intake or output data in the 24 hours  ending 06/02/18 1640  PHYSICAL EXAMINATION:  GENERAL:  85 y.o.-year-old patient lying in the bed with no acute distress.  EYES: Pupils equal, round, reactive to light and accommodation. No scleral icterus. Extraocular muscles intact.  HEENT: Head atraumatic, normocephalic. Oropharynx and nasopharynx clear.  NECK:  Supple, no jugular venous distention. No thyroid enlargement, no tenderness.  LUNGS: Normal breath sounds bilaterally, no wheezing, rales,rhonchi or crepitation. No use of accessory muscles of respiration.  CARDIOVASCULAR: S1, S2 normal. No murmurs, rubs, or gallops.  ABDOMEN: Soft, non-tender, non-distended. Bowel sounds present. No organomegaly or mass.  EXTREMITIES: No pedal edema, cyanosis, or clubbing.  NEUROLOGIC: Cranial nerves II through XII are intact. Muscle strength 5/5 in all extremities. Sensation intact. Gait not checked.  PSYCHIATRIC: The patient is alert and oriented x 3.  SKIN: No obvious rash, lesion, or ulcer.   DATA REVIEW:   CBC Recent Labs  Lab 05/29/18 0331  WBC 10.3  HGB 14.5  HCT 42.6  PLT 171    Chemistries  Recent Labs  Lab 05/29/18 0331  NA 138  K 4.4  CL 100  CO2 31  GLUCOSE 234*  BUN 23  CREATININE 1.11  CALCIUM 9.1    Cardiac Enzymes Recent Labs  Lab 05/29/18 0331  TROPONINI 0.03*    Microbiology Results  Results for orders placed or performed during the hospital encounter of 10/12/17  Blood Culture (routine x 2)     Status: None   Collection Time: 10/12/17  5:51 PM  Result Value Ref Range Status   Specimen Description BLOOD BLOOD RIGHT WRIST  Final   Special Requests   Final    BOTTLES DRAWN AEROBIC AND ANAEROBIC Blood Culture adequate volume   Culture   Final    NO GROWTH 5 DAYS Performed at [facility], [address] [place], [place] [address]    Report Status 10/17/2017 FINAL  Final  Blood Culture (routine x 2)     Status: None   Collection Time: 10/12/17  5:51 PM  Result Value Ref Range Status    Specimen Description BLOOD BLOOD LEFT WRIST  Final   Special Requests   Final    BOTTLES DRAWN AEROBIC AND ANAEROBIC Blood Culture results may not be optimal due to an inadequate volume of blood received in culture bottles   Culture   Final    NO GROWTH 5 DAYS Performed at [facility], [address], [place], [place] [address]    Report Status 10/17/2017 FINAL  Final    RADIOLOGY:  No results found.  EKG:   Orders placed or performed during the hospital encounter of 05/29/18  . ED EKG within 10 minutes  . ED EKG within 10 minutes  . EKG 12-Lead  . EKG 12-Lead  . EKG      Management plans discussed with the patient, family and they are in agreement.  CODE STATUS:  Code Status History    Date Active Date Inactive Code Status Order ID Comments User Context   05/29/2018 1557 05/30/2018 1622 DNR [id number]  Altamese Dilling, MD Inpatient   05/29/2018 0926 05/29/2018 1557 Full Code [id number]  Arnaldo Natal, MD Inpatient   10/12/2017 1957 10/14/2017 1415 Full Code [id number]  Bertrum Sol, MD Inpatient   06/26/2016 0746 06/27/2016 2041 Full Code [id number]  Ihor Austin, MD Inpatient   06/12/2016 0941 06/13/2016 1507 Full Code [id number]  Adrian Saran, MD Inpatient   08/02/2015 0819 08/03/2015 1430 Full Code [id number]  Arnaldo Natal, MD Inpatient    Questions for Most Recent Historical Code Status (Order [id number])    Question Answer Comment   In the event of cardiac or respiratory ARREST Do not call a "code blue"    In the event of cardiac or respiratory ARREST Do not perform Intubation, CPR, defibrillation or ACLS    In the event of cardiac or respiratory ARREST Use medication by any route, position, wound care, and other measures to relive pain and suffering. May use oxygen, suction and manual treatment of airway obstruction as needed for comfort.       TOTAL TIME TAKING CARE OF THIS PATIENT: 35 minutes.    Altamese Dilling M.D on 06/02/2018 at  4:40 PM  Between 7am to 6pm - Pager - [phone]  After 6pm go to [email or web address] - password [email or web address]  [facility]  Office  [phone]  CC: Primary care physician; Danella Penton, MD   Note: This dictation was prepared  with Dragon dictation along with smaller phrase technology. Any transcriptional errors that result from this process are unintentional.

## 2018-06-05 DIAGNOSIS — E538 Deficiency of other specified B group vitamins: Secondary | ICD-10-CM | POA: Diagnosis not present

## 2018-06-05 DIAGNOSIS — J441 Chronic obstructive pulmonary disease with (acute) exacerbation: Secondary | ICD-10-CM | POA: Diagnosis not present

## 2018-06-05 DIAGNOSIS — J479 Bronchiectasis, uncomplicated: Secondary | ICD-10-CM | POA: Diagnosis not present

## 2018-06-05 DIAGNOSIS — E1169 Type 2 diabetes mellitus with other specified complication: Secondary | ICD-10-CM | POA: Diagnosis not present

## 2018-06-11 DIAGNOSIS — R262 Difficulty in walking, not elsewhere classified: Secondary | ICD-10-CM | POA: Diagnosis not present

## 2018-06-11 DIAGNOSIS — R2681 Unsteadiness on feet: Secondary | ICD-10-CM | POA: Diagnosis not present

## 2018-06-15 DIAGNOSIS — J4540 Moderate persistent asthma, uncomplicated: Secondary | ICD-10-CM | POA: Diagnosis not present

## 2018-06-15 DIAGNOSIS — J439 Emphysema, unspecified: Secondary | ICD-10-CM | POA: Diagnosis not present

## 2018-06-15 DIAGNOSIS — J471 Bronchiectasis with (acute) exacerbation: Secondary | ICD-10-CM | POA: Diagnosis not present

## 2018-06-19 DIAGNOSIS — R262 Difficulty in walking, not elsewhere classified: Secondary | ICD-10-CM | POA: Diagnosis not present

## 2018-06-19 DIAGNOSIS — R2681 Unsteadiness on feet: Secondary | ICD-10-CM | POA: Diagnosis not present

## 2018-06-24 DIAGNOSIS — R262 Difficulty in walking, not elsewhere classified: Secondary | ICD-10-CM | POA: Diagnosis not present

## 2018-06-24 DIAGNOSIS — R2681 Unsteadiness on feet: Secondary | ICD-10-CM | POA: Diagnosis not present

## 2018-06-26 DIAGNOSIS — R262 Difficulty in walking, not elsewhere classified: Secondary | ICD-10-CM | POA: Diagnosis not present

## 2018-06-26 DIAGNOSIS — R2681 Unsteadiness on feet: Secondary | ICD-10-CM | POA: Diagnosis not present

## 2018-07-01 DIAGNOSIS — R262 Difficulty in walking, not elsewhere classified: Secondary | ICD-10-CM | POA: Diagnosis not present

## 2018-07-01 DIAGNOSIS — R2681 Unsteadiness on feet: Secondary | ICD-10-CM | POA: Diagnosis not present

## 2018-07-14 DIAGNOSIS — Z961 Presence of intraocular lens: Secondary | ICD-10-CM | POA: Diagnosis not present

## 2018-07-16 DIAGNOSIS — J4540 Moderate persistent asthma, uncomplicated: Secondary | ICD-10-CM | POA: Diagnosis not present

## 2018-07-16 DIAGNOSIS — J439 Emphysema, unspecified: Secondary | ICD-10-CM | POA: Diagnosis not present

## 2018-07-16 DIAGNOSIS — J471 Bronchiectasis with (acute) exacerbation: Secondary | ICD-10-CM | POA: Diagnosis not present

## 2018-07-30 DIAGNOSIS — B359 Dermatophytosis, unspecified: Secondary | ICD-10-CM | POA: Diagnosis not present

## 2018-07-30 DIAGNOSIS — L578 Other skin changes due to chronic exposure to nonionizing radiation: Secondary | ICD-10-CM | POA: Diagnosis not present

## 2018-07-30 DIAGNOSIS — B351 Tinea unguium: Secondary | ICD-10-CM | POA: Diagnosis not present

## 2018-07-30 DIAGNOSIS — L570 Actinic keratosis: Secondary | ICD-10-CM | POA: Diagnosis not present

## 2018-08-04 DIAGNOSIS — H353132 Nonexudative age-related macular degeneration, bilateral, intermediate dry stage: Secondary | ICD-10-CM | POA: Diagnosis not present

## 2018-08-04 DIAGNOSIS — H1189 Other specified disorders of conjunctiva: Secondary | ICD-10-CM | POA: Diagnosis not present

## 2018-08-04 DIAGNOSIS — H35363 Drusen (degenerative) of macula, bilateral: Secondary | ICD-10-CM | POA: Diagnosis not present

## 2018-08-04 DIAGNOSIS — H18413 Arcus senilis, bilateral: Secondary | ICD-10-CM | POA: Diagnosis not present

## 2018-08-08 ENCOUNTER — Emergency Department: Payer: Medicare Other

## 2018-08-08 ENCOUNTER — Other Ambulatory Visit: Payer: Self-pay

## 2018-08-08 ENCOUNTER — Emergency Department
Admission: EM | Admit: 2018-08-08 | Discharge: 2018-08-08 | Disposition: A | Discharge status: Home / Self Care | Payer: Medicare Other | Attending: Emergency Medicine | Admitting: Emergency Medicine

## 2018-08-08 DIAGNOSIS — E1165 Type 2 diabetes mellitus with hyperglycemia: Secondary | ICD-10-CM | POA: Diagnosis not present

## 2018-08-08 DIAGNOSIS — R109 Unspecified abdominal pain: Secondary | ICD-10-CM | POA: Insufficient documentation

## 2018-08-08 DIAGNOSIS — J45909 Unspecified asthma, uncomplicated: Secondary | ICD-10-CM | POA: Insufficient documentation

## 2018-08-08 DIAGNOSIS — R112 Nausea with vomiting, unspecified: Secondary | ICD-10-CM | POA: Diagnosis not present

## 2018-08-08 DIAGNOSIS — E119 Type 2 diabetes mellitus without complications: Secondary | ICD-10-CM | POA: Diagnosis not present

## 2018-08-08 DIAGNOSIS — N200 Calculus of kidney: Secondary | ICD-10-CM | POA: Diagnosis not present

## 2018-08-08 DIAGNOSIS — E869 Volume depletion, unspecified: Secondary | ICD-10-CM | POA: Diagnosis not present

## 2018-08-08 DIAGNOSIS — Z7984 Long term (current) use of oral hypoglycemic drugs: Secondary | ICD-10-CM | POA: Insufficient documentation

## 2018-08-08 DIAGNOSIS — Z79899 Other long term (current) drug therapy: Secondary | ICD-10-CM | POA: Insufficient documentation

## 2018-08-08 DIAGNOSIS — K920 Hematemesis: Secondary | ICD-10-CM | POA: Diagnosis not present

## 2018-08-08 DIAGNOSIS — R0902 Hypoxemia: Secondary | ICD-10-CM | POA: Diagnosis not present

## 2018-08-08 DIAGNOSIS — Z7982 Long term (current) use of aspirin: Secondary | ICD-10-CM | POA: Insufficient documentation

## 2018-08-08 DIAGNOSIS — R000 Tachycardia, unspecified: Secondary | ICD-10-CM | POA: Diagnosis not present

## 2018-08-08 LAB — COMPREHENSIVE METABOLIC PANEL
ALT: 106 U/L — ABNORMAL HIGH (ref 0–44)
AST: 77 U/L — ABNORMAL HIGH (ref 15–41)
Albumin: 3.6 g/dL (ref 3.5–5.0)
Alkaline Phosphatase: 44 U/L (ref 38–126)
Anion gap: 11 (ref 5–15)
BUN: 34 mg/dL — ABNORMAL HIGH (ref 8–23)
CO2: 24 mmol/L (ref 22–32)
Calcium: 7.8 mg/dL — ABNORMAL LOW (ref 8.9–10.3)
Chloride: 104 mmol/L (ref 98–111)
Creatinine, Ser: 1.32 mg/dL — ABNORMAL HIGH (ref 0.61–1.24)
GFR calc non Af Amer: 49 mL/min — ABNORMAL LOW (ref >60)
GFR, EST AFRICAN AMERICAN: 57 mL/min — AB (ref >60)
Glucose, Bld: 335 mg/dL — ABNORMAL HIGH (ref 70–99)
Potassium: 4.5 mmol/L (ref 3.5–5.1)
Sodium: 139 mmol/L (ref 135–145)
Total Bilirubin: 1.7 mg/dL — ABNORMAL HIGH (ref 0.3–1.2)
Total Protein: 6 g/dL — ABNORMAL LOW (ref 6.5–8.1)

## 2018-08-08 LAB — CBC WITH DIFFERENTIAL/PLATELET
Abs Immature Granulocytes: 0.1 10*3/uL — ABNORMAL HIGH (ref 0.00–0.07)
Basophils Absolute: 0.1 10*3/uL (ref 0.0–0.1)
Basophils Relative: 0 %
EOS PCT: 1 %
Eosinophils Absolute: 0.1 10*3/uL (ref 0.0–0.5)
HCT: 48.3 % (ref 39.0–52.0)
Hemoglobin: 15.8 g/dL (ref 13.0–17.0)
Immature Granulocytes: 1 %
LYMPHS PCT: 2 %
Lymphs Abs: 0.2 10*3/uL — ABNORMAL LOW (ref 0.7–4.0)
MCH: 31.7 pg (ref 26.0–34.0)
MCHC: 32.7 g/dL (ref 30.0–36.0)
MCV: 97 fL (ref 80.0–100.0)
Monocytes Absolute: 0.7 10*3/uL (ref 0.1–1.0)
Monocytes Relative: 6 %
Neutro Abs: 10.2 10*3/uL — ABNORMAL HIGH (ref 1.7–7.7)
Neutrophils Relative %: 90 %
Platelets: 186 10*3/uL (ref 150–400)
RBC: 4.98 MIL/uL (ref 4.22–5.81)
RDW: 13.7 % (ref 11.5–15.5)
WBC: 11.5 10*3/uL — ABNORMAL HIGH (ref 4.0–10.5)
nRBC: 0.2 % (ref 0.0–0.2)

## 2018-08-08 LAB — TROPONIN I

## 2018-08-08 LAB — LACTIC ACID, PLASMA: Lactic Acid, Venous: 1.6 mmol/L (ref 0.5–1.9)

## 2018-08-08 LAB — LIPASE, BLOOD: Lipase: 33 U/L (ref 11–51)

## 2018-08-08 MED ORDER — ONDANSETRON HCL 4 MG/2ML IJ SOLN
INTRAMUSCULAR | Status: AC
  Filled 2018-08-08: qty 2

## 2018-08-08 MED ORDER — IOPAMIDOL (ISOVUE-300) INJECTION 61%
80.0000 mL | Freq: Once | INTRAVENOUS | Status: AC | PRN
  Administered 2018-08-08: 80 mL via INTRAVENOUS

## 2018-08-08 MED ORDER — SODIUM CHLORIDE 0.9 % IV BOLUS
500.0000 mL | Freq: Once | INTRAVENOUS | Status: AC
  Administered 2018-08-08: 500 mL via INTRAVENOUS

## 2018-08-08 MED ORDER — ONDANSETRON 4 MG PO TBDP: ORAL_TABLET | ORAL | 0 refills | Status: DC

## 2018-08-08 MED ORDER — ONDANSETRON HCL 4 MG/2ML IJ SOLN
4.0000 mg | Freq: Once | INTRAMUSCULAR | Status: AC
  Administered 2018-08-08: 4 mg via INTRAVENOUS

## 2018-08-08 NOTE — ED Triage Notes (Signed)
Patient alert and oriented. Patient arrived to room 11 by [facility]. Patient's wife called EMS stating patient was vomiting blood. Patient had vomited multiple times purple colored emisis. Patient states he had ate blueberry pie yesterday evening.  EMS states patient had wheezing on respirations with O2 of 81 that dropped to 79%. Patient was placed on Non-re breather and O2 increased to 94%. Patient was given Duo-neb breathing treatment in route to ER. Patient has hx of COPD, diabetes. Patient has wheezing in all lung fields. Patient placed on 1.5 liter of O2. Patient currently stating 93%. Patient's wife in room with patient. Will continue to monitor.

## 2018-08-08 NOTE — Discharge Instructions (Signed)

## 2018-08-08 NOTE — ED Provider Notes (Addendum)
[facility] Emergency Department Provider Note  ____________________________________________   First MD Initiated Contact with Patient 08/08/18 [phone]     (approximate)  I have reviewed the triage vital signs and the nursing notes.   HISTORY  Chief Complaint Nausea    HPI George Hensley is a 84 y.o. male with medical history as listed below which notably includes chronic lung disease with oxygen used as needed at home.  He presents by EMS for evaluation of persistent vomiting.  He reports that he started vomiting shortly after dinner and has been unable to stop.  He has not been able to tolerate anything by mouth without vomiting again and he feels quite thirsty.  He denies having any episodes of chest pain or shortness of breath greater than his baseline.  He says that he is not worried about his breathing at all.  His wife share the same meal tonight and has not been ill.  In spite of the nausea and vomiting, he is reporting no abdominal pain.  He denies fever/chills and reports that he has been in his usual state of health until the vomiting began.  His wife confirmed most of that although she feels that his breathing has been a little bit worse lately and he has been on steroids as of the last few days.  Past Medical History:  Diagnosis Date  . Asthma   . Bronchiectasis (HCC)   . COPD (chronic obstructive pulmonary disease) (HCC)   . Diabetes mellitus     Patient Active Problem List   Diagnosis Date Noted  . Acute on chronic respiratory failure with hypoxia and hypercapnia (HCC) 05/29/2018  . CAP (community acquired pneumonia) 10/12/2017  . Dyspnea 06/26/2016  . Asthma exacerbation 06/26/2016  . Hypoxia 06/12/2016  . Acute on chronic respiratory failure with hypoxia (HCC) 08/02/2015  . Asthma 04/04/2011  . Pulmonary nodule 03/06/2011    Past Surgical History:  Procedure Laterality Date  . CHOLECYSTECTOMY  2000  . CORONARY STENT PLACEMENT  1999      Prior to Admission medications   Medication Sig Start Date End Date Taking? Authorizing Provider  aspirin EC 81 MG tablet Take 81 mg by mouth daily.  09/22/12  Yes [provider]  glimepiride (AMARYL) 4 MG tablet Take 4 mg by mouth 2 (two) times daily.   Yes [provider]  mometasone-formoterol (DULERA) 200-5 MCG/ACT AERO Inhale 2 puffs into the lungs 2 (two) times daily. 06/27/16  Yes Sudini, Wardell Heath, MD  predniSONE (STERAPRED UNI-PAK 21 TAB) 10 MG (21) TBPK tablet Take 6 tabs first day, 5 tab on day 2, then 4 on day 3rd, 3 tabs on day 4th , 2 tab on day 5th, and 1 tab on 6th day.- after that cont 10 mg daily, as was taking at home. 05/30/18  Yes Altamese Dilling, MD  sitaGLIPtin (JANUVIA) 100 MG tablet Take 1 tablet by mouth daily.   Yes [provider]  tiotropium (SPIRIVA) 18 MCG inhalation capsule Place 18 mcg into inhaler and inhale daily.   Yes [provider]  Cholecalciferol (VITAMIN D-1000 MAX ST) 1000 units tablet Take 1 tablet by mouth 3 (three) times a week.    [provider]  ketorolac (ACULAR) 0.5 % ophthalmic solution Place 1 drop into the left eye 3 (three) times daily. 10/05/17   [provider]  metFORMIN (GLUCOPHAGE) 1000 MG tablet Take 1,000 mg by mouth 2 (two) times daily.    [provider]  ofloxacin (  OCUFLOX) 0.3 % ophthalmic solution Place 1 drop into the left eye 3 (three) times daily. 10/05/17   [provider]  ondansetron (ZOFRAN ODT) 4 MG disintegrating tablet Allow 1-2 tablets to dissolve in your mouth every 8 hours as needed for nausea/vomiting 08/08/18   Loleta Rose, MD  prednisoLONE acetate (PRED FORTE) 1 % ophthalmic suspension Place 1 drop into the left eye 3 (three) times daily. 10/05/17   [provider]  VENTOLIN HFA 108 (90 Base) MCG/ACT inhaler Inhale 1 puff into the lungs every 6 (six) hours as needed. 06/25/16   [provider]     Allergies Metoprolol  Family History  Problem Relation Age of Onset  . Asthma Brother     Social History Social History   Tobacco Use  . Smoking status: Never Smoker  . Smokeless tobacco: Current User    Types: Chew  Substance Use Topics  . Alcohol use: Yes    Comment: socially last drink 2 weeks ago  . Drug use: No    Review of Systems Constitutional: No fever/chills Eyes: No visual changes. ENT: No sore throat. Cardiovascular: Denies chest pain. Respiratory: Chronic shortness of breath, no worse than baseline. Gastrointestinal: Persistent and severe vomiting for hours.  No abdominal pain.  No diarrhea.  No constipation. Genitourinary: Negative for dysuria. Musculoskeletal: Negative for neck pain.  Negative for back pain. Integumentary: Negative for rash. Neurological: Negative for headaches, focal weakness or numbness.   ____________________________________________   PHYSICAL EXAM:  VITAL SIGNS: ED Triage Vitals  Enc Vitals Group     BP 08/08/18 0516 117/73     Pulse Rate 08/08/18 0516 (!) 109     Resp 08/08/18 0516 20     Temp 08/08/18 0516 97.8 F (36.6 C)     Temp Source 08/08/18 0516 Oral     SpO2 08/08/18 0516 94 %     Weight 08/08/18 0517 78 kg (172 lb)     Height 08/08/18 0517 1.778 m (5\' 10" )     Head Circumference --      Peak Flow --      Pain Score 08/08/18 0517 0     Pain Loc --      Pain Edu? --      Excl. in GC? --     Constitutional: Alert and oriented.  Elderly but in no acute distress.  Appropriately conversant. Eyes: Conjunctivae are normal.  Head: Atraumatic. Nose: No congestion/rhinnorhea. Mouth/Throat: Mucous membranes are dry. Neck: No stridor.  No meningeal signs.   Cardiovascular: Mild tachycardia, regular rhythm. Good peripheral circulation. Grossly normal heart sounds. Respiratory: Normal respiratory effort.  No retractions. Lungs CTAB. Gastrointestinal: Soft and nontender. No distention.  Musculoskeletal: No lower  extremity tenderness nor edema. No gross deformities of extremities. Neurologic:  Normal speech and language. No gross focal neurologic deficits are appreciated.  Skin:  Skin is warm, dry and intact. No rash noted. Psychiatric: Mood and affect are normal. Speech and behavior are normal.  ____________________________________________   LABS (all labs ordered are listed, but only abnormal results are displayed)  Labs Reviewed  CBC WITH DIFFERENTIAL/PLATELET - Abnormal; Notable for the following components:      Result Value   WBC 11.5 (*)    Neutro Abs 10.2 (*)    Lymphs Abs 0.2 (*)    Abs Immature Granulocytes 0.10 (*)    All other components within normal limits  COMPREHENSIVE METABOLIC PANEL - Abnormal; Notable for the following components:   Glucose, Bld 335 (*)  BUN 34 (*)    Creatinine, Ser 1.32 (*)    Calcium 7.8 (*)    Total Protein 6.0 (*)    AST 77 (*)    ALT 106 (*)    Total Bilirubin 1.7 (*)    GFR calc non Af Amer 49 (*)    GFR calc Af Amer 57 (*)    All other components within normal limits  LACTIC ACID, PLASMA  LIPASE, BLOOD  TROPONIN I   ____________________________________________  EKG  ED ECG REPORT I, Loleta Rose, the attending physician, personally viewed and interpreted this ECG.  Date: 08/08/2018 EKG Time: 7:58 AM Rate: 103 Rhythm: Mild sinus tachycardia QRS Axis: normal Intervals: Left anterior fascicular block ST/T Wave abnormalities: Non-specific ST segment / T-wave changes, but no evidence of acute ischemia. Narrative Interpretation: no evidence of acute ischemia   ____________________________________________  RADIOLOGY I, Loleta Rose, personally viewed and evaluated these images (plain radiographs) as part of my medical decision making, as well as reviewing the written report by the radiologist.  ED MD interpretation: No acute abnormalities on two-view chest x-ray.  CT scan shows no acute abnormalities  Official radiology  report(s): Dg Chest 2 View  Result Date: 08/08/2018 CLINICAL DATA:  84 y/o  M; shortness of breath and hypoxia. EXAM: CHEST - 2 VIEW COMPARISON:  05/29/2018 chest radiograph FINDINGS: Stable normal cardiac silhouette given projection and technique. Aortic atherosclerosis with calcification. No focal consolidation. No pleural effusion or pneumothorax. No acute osseous abnormality is evident. IMPRESSION: No acute pulmonary process identified. Electronically Signed   By: Mitzi Hansen M.D.   On: 08/08/2018 06:28   Ct Abdomen Pelvis W Contrast  Result Date: 08/08/2018 CLINICAL DATA:  Vomiting with blood in vomit.  Abdominal pain. EXAM: CT ABDOMEN AND PELVIS WITH CONTRAST TECHNIQUE: Multidetector CT imaging of the abdomen and pelvis was performed using the standard protocol following bolus administration of intravenous contrast. CONTRAST:  80mL ISOVUE-300 IOPAMIDOL (ISOVUE-300) INJECTION 61% COMPARISON:  None. FINDINGS: Lower chest: There is bibasilar atelectatic change with mild bibasilar scarring. No lung base edema or consolidation. There are foci of coronary artery calcification. There is a small hiatal hernia. Hepatobiliary: There is a cyst in the anterior segment of the right lobe of the liver measuring 1.4 x 1.2 cm. There is a cyst along the inferior posterior segment of the right lobe of the liver measuring 1.7 x 1.3 cm. There is a 4 mm cyst or small hamartoma in the posterior segment right lobe of the liver as well. No other liver lesions evident. Gallbladder is absent. There is borderline intrahepatic biliary duct dilatation, a likely consequence of post cholecystectomy state. There is no extrahepatic biliary duct dilatation. No biliary duct mass or calculus is evident. Pancreas: No pancreatic mass or inflammatory focus. There is mild fatty infiltration within the pancreas. Spleen: No splenic lesions are evident. Adrenals/Urinary Tract: Adrenals bilaterally appear normal. There is a 6 mm  cyst in the upper pole of the left kidney. There is a cyst arising from the posterior lower pole right kidney measuring 2.6 x 2.3 cm. There is a cyst arising from the posterior lower pole left kidney measuring 2.6 x 1.8 cm. There is no appreciable hydronephrosis on either side. There is a 1 mm calculus in the lower pole left kidney. There is no ureteral calculus on either side. Urinary bladder is midline with wall thickness within normal limits. Urinary bladder is mildly distended. Stomach/Bowel: There is fairly diffuse stool throughout the colon. There are sigmoid diverticula  without diverticulitis. The cecum is toward the upper limits of normal in size and is filled with fluid and stool. There is no appreciable small bowel dilatation or wall thickening. There is no demonstrable bowel obstruction. There is no appreciable free air or portal venous air. Note that there is fluid in the stomach. There is increased attenuation material along the posterior aspect of the body of the stomach. No gastric ulceration is appreciable by CT. Vascular/Lymphatic: There is aortic tortuosity without aneurysm. There is aortoiliac atherosclerosis. Major mesenteric arterial vessels appear patent with relatively mild atherosclerotic calcification in proximal major mesenteric arteries. No adenopathy is appreciable in the abdomen or pelvis. Reproductive: There are scattered prostatic calculi. Prostate and seminal vesicles appear overall within normal limits with respect to size and contour. No pelvic mass is demonstrable. Other: No periappendiceal region inflammation evident. No abscess or ascites is apparent in the abdomen or pelvis. Musculoskeletal: There is degenerative change throughout the lower thoracic and lumbar regions. There are pars defects at L5 bilaterally with slight anterolisthesis of L5 on S1. There are no blastic or lytic bone lesions appreciable. There is no intramuscular or abdominal wall lesion evident. IMPRESSION: 1.  Cecum upper normal in size containing moderate fluid. Question a degree of colitis in this area. 2. Moderate fluid in the stomach. Soft tissue attenuation material in the posterior body of the stomach may represent food material. A gastric mass could be obscured by this material. Given history of blood in vomitus, it may be prudent to consider direct visualization of the stomach for further assessment given this appearance. 3. No bowel obstruction. No abscess in the abdomen or pelvis. No periappendiceal region inflammatory change evident. 4. 1 mm nonobstructing calculus lower pole left kidney. No hydronephrosis or ureteral calculus on either side. There are scattered prostatic calculi. 5.  Small hiatal hernia. 6. Aortoiliac atherosclerosis. Foci of coronary artery calcification noted. 7. 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 8. Gallbladder absent. Borderline intrahepatic biliary duct dilatation is likely a consequence of post cholecystectomy state. Electronically Signed   By: Bretta BangWilliam  Woodruff III M.D.   On: 08/08/2018 07:46    ____________________________________________   PROCEDURES  Critical Care performed: No   Procedure(s) performed:   Procedures   ____________________________________________   INITIAL IMPRESSION / ASSESSMENT AND PLAN / ED COURSE  As part of my medical decision making, I reviewed the following data within the electronic MEDICAL RECORD NUMBER History obtained from family, Interpreter needed, Labs reviewed , EKG interpreted , Old chart reviewed, Patient signed out to Dr. Alphonzo LemmingsMcShane, Radiograph reviewed  and Notes from prior ED visits    Differential diagnosis includes, but is not limited to, bowel obstruction/ileus, viral illness, mesenteric ischemia, neoplasm.  The patient may also have some renal insufficiency or electrolyte abnormality after the persistent vomiting.  Labs are pending.  Chest x-ray and EKG are reassuring.  I will obtain a CT scan  after determining whether or not he has a degree of renal failure.  The patient is feeling quite a bit better after Zofran 4 mg IV.  He received 500 mL of normal saline by EMS prior to arrival and I am giving him another 500 mL.  He does have some tachycardia which I suspect secondary to volume depletion as well as his acute discomfort.  No evidence of sepsis.  Clinical Course as of Aug 08 804  Fri Aug 08, 2018  [id number] Lactic Acid, Venous: 1.6 [CF]  [id number] Transferring ED care to Dr. Alphonzo LemmingsMcShane.  The patient needs a period of observation in the emergency department, but his CT scan is reassuring with no evidence of an acute abnormality that is causing the nausea and vomiting.  He has tolerated some ice chips but he will need a full p.o. challenge.  He has some mild LFT elevation which is likely secondary to the persistent vomiting and an elevated creatinine from his baseline but he has received 1 L normal saline.  His glucose was elevated but this is also addressed through the normal saline.  Lactic acid was normal, troponin was negative, lipase was normal, and only very mild leukocytosis of 11.5.The patient was in no distress when I last saw him and Dr. Alphonzo Lemmings will reassess him.  In my opinion, if the patient is tolerating oral intake and continues to have no pain and he wants to go home, it would be appropriate to discharge him for close outpatient follow up.   [CF]    Clinical Course User Index [CF] Loleta Rose, MD    ____________________________________________  FINAL CLINICAL IMPRESSION(S) / ED DIAGNOSES  Final diagnoses:  Non-intractable vomiting with nausea, unspecified vomiting type  Volume depletion     MEDICATIONS GIVEN DURING THIS VISIT:  Medications  ondansetron (ZOFRAN) injection 4 mg (4 mg Intravenous Given 08/08/18 0610)  sodium chloride 0.9 % bolus 500 mL (500 mLs Intravenous New Bag/Given 08/08/18 0654)  iopamidol (ISOVUE-300) 61 % injection 80 mL (80 mLs Intravenous Contrast  Given 08/08/18 0713)     ED Discharge Orders         Ordered    ondansetron (ZOFRAN ODT) 4 MG disintegrating tablet     08/08/18 0804           Note:  This document was prepared using Dragon voice recognition software and may include unintentional dictation errors.    Loleta Rose, MD 08/08/18 2952    Loleta Rose, MD 08/08/18 [id number]

## 2018-08-08 NOTE — ED Notes (Signed)
Pt successfully able to drink water and eat crackers w/o vomiting. Pt denies any nausea at this time.

## 2018-08-08 NOTE — ED Notes (Signed)
Pt given water to drink and crackers to PO challenge. MD aware.

## 2018-08-08 NOTE — ED Notes (Signed)
Patient transported to CT 

## 2018-08-08 NOTE — ED Notes (Signed)
PT assisted to bedside commode. Peri-care performed independently.

## 2018-08-15 DIAGNOSIS — J439 Emphysema, unspecified: Secondary | ICD-10-CM | POA: Diagnosis not present

## 2018-08-15 DIAGNOSIS — J471 Bronchiectasis with (acute) exacerbation: Secondary | ICD-10-CM | POA: Diagnosis not present

## 2018-08-15 DIAGNOSIS — J4540 Moderate persistent asthma, uncomplicated: Secondary | ICD-10-CM | POA: Diagnosis not present

## 2018-08-15 DIAGNOSIS — H35361 Drusen (degenerative) of macula, right eye: Secondary | ICD-10-CM | POA: Diagnosis not present

## 2018-08-15 DIAGNOSIS — H59031 Cystoid macular edema following cataract surgery, right eye: Secondary | ICD-10-CM | POA: Diagnosis not present

## 2018-08-30 DIAGNOSIS — S5002XA Contusion of left elbow, initial encounter: Secondary | ICD-10-CM | POA: Diagnosis not present

## 2018-08-30 DIAGNOSIS — E119 Type 2 diabetes mellitus without complications: Secondary | ICD-10-CM | POA: Diagnosis not present

## 2018-08-30 DIAGNOSIS — S8002XA Contusion of left knee, initial encounter: Secondary | ICD-10-CM | POA: Diagnosis not present

## 2018-08-30 DIAGNOSIS — W01198A Fall on same level from slipping, tripping and stumbling with subsequent striking against other object, initial encounter: Secondary | ICD-10-CM | POA: Diagnosis not present

## 2018-08-30 DIAGNOSIS — S60212A Contusion of left wrist, initial encounter: Secondary | ICD-10-CM | POA: Diagnosis not present

## 2018-09-01 DIAGNOSIS — J9611 Chronic respiratory failure with hypoxia: Secondary | ICD-10-CM | POA: Diagnosis not present

## 2018-09-01 DIAGNOSIS — L03114 Cellulitis of left upper limb: Secondary | ICD-10-CM | POA: Diagnosis not present

## 2018-09-01 DIAGNOSIS — W01198A Fall on same level from slipping, tripping and stumbling with subsequent striking against other object, initial encounter: Secondary | ICD-10-CM | POA: Diagnosis not present

## 2018-09-01 DIAGNOSIS — S41112A Laceration without foreign body of left upper arm, initial encounter: Secondary | ICD-10-CM | POA: Diagnosis not present

## 2018-09-02 DIAGNOSIS — J479 Bronchiectasis, uncomplicated: Secondary | ICD-10-CM | POA: Diagnosis not present

## 2018-09-02 DIAGNOSIS — J4540 Moderate persistent asthma, uncomplicated: Secondary | ICD-10-CM | POA: Diagnosis not present

## 2018-09-04 ENCOUNTER — Encounter: Payer: Medicare Other | Attending: Physician Assistant | Admitting: Physician Assistant

## 2018-09-04 DIAGNOSIS — H3530 Unspecified macular degeneration: Secondary | ICD-10-CM | POA: Insufficient documentation

## 2018-09-04 DIAGNOSIS — W19XXXA Unspecified fall, initial encounter: Secondary | ICD-10-CM | POA: Diagnosis not present

## 2018-09-04 DIAGNOSIS — S51802A Unspecified open wound of left forearm, initial encounter: Secondary | ICD-10-CM | POA: Diagnosis not present

## 2018-09-04 DIAGNOSIS — S51002A Unspecified open wound of left elbow, initial encounter: Secondary | ICD-10-CM | POA: Insufficient documentation

## 2018-09-04 DIAGNOSIS — S61502A Unspecified open wound of left wrist, initial encounter: Secondary | ICD-10-CM | POA: Insufficient documentation

## 2018-09-04 DIAGNOSIS — E11622 Type 2 diabetes mellitus with other skin ulcer: Secondary | ICD-10-CM | POA: Insufficient documentation

## 2018-09-04 DIAGNOSIS — I10 Essential (primary) hypertension: Secondary | ICD-10-CM | POA: Diagnosis not present

## 2018-09-04 DIAGNOSIS — Z8673 Personal history of transient ischemic attack (TIA), and cerebral infarction without residual deficits: Secondary | ICD-10-CM | POA: Insufficient documentation

## 2018-09-04 DIAGNOSIS — S81001A Unspecified open wound, right knee, initial encounter: Secondary | ICD-10-CM | POA: Diagnosis not present

## 2018-09-04 DIAGNOSIS — J45909 Unspecified asthma, uncomplicated: Secondary | ICD-10-CM | POA: Diagnosis not present

## 2018-09-04 DIAGNOSIS — Z87891 Personal history of nicotine dependence: Secondary | ICD-10-CM | POA: Insufficient documentation

## 2018-09-04 DIAGNOSIS — S81002A Unspecified open wound, left knee, initial encounter: Secondary | ICD-10-CM | POA: Insufficient documentation

## 2018-09-04 DIAGNOSIS — L97812 Non-pressure chronic ulcer of other part of right lower leg with fat layer exposed: Secondary | ICD-10-CM | POA: Diagnosis not present

## 2018-09-04 DIAGNOSIS — S61402A Unspecified open wound of left hand, initial encounter: Secondary | ICD-10-CM | POA: Diagnosis not present

## 2018-09-04 DIAGNOSIS — L97822 Non-pressure chronic ulcer of other part of left lower leg with fat layer exposed: Secondary | ICD-10-CM | POA: Diagnosis not present

## 2018-09-08 NOTE — Progress Notes (Signed)
George Hensley, Loretta F. ([id number]) Visit Report for 09/04/2018 Allergy List Details Patient Name: George Hensley, George F. Date of Service: 09/04/2018 2:30 PM Medical Record Number: [id number] Patient Account Number: [id number] Date of Birth/Sex: Oct 29, 1930 [age] y.o. Male) Treating RN: Arnette NorrisBiell, Kristina Primary Care Kaden Daughdrill: Bethann PunchesMiller, Mark Other Clinician: Referring Florabel Faulks: Bethann PunchesMiller, Mark Treating Estefany Goebel/Extender: STONE III, HOYT Weeks in Treatment: 0 Allergies Active Allergies ramipril Severity: Moderate Toprol XL Allergy Notes Electronic Signature(s) Signed: 09/08/2018 3:50:02 PM By: Arnette NorrisBiell, Kristina Entered By: Arnette NorrisBiell, Kristina on 09/04/2018 15:02:24 George Hensley, George F. ([id number]) -------------------------------------------------------------------------------- Arrival Information Details Patient Name: George Hensley, George F. Date of Service: 09/04/2018 2:30 PM Medical Record Number: [id number] Patient Account Number: [id number] Date of Birth/Sex: Oct 29, 1930 [age] y.o. Male) Treating RN: Arnette NorrisBiell, Kristina Primary Care Jacquelyn Shadrick: Bethann PunchesMiller, Mark Other Clinician: Referring Presly Steinruck: Bethann PunchesMiller, Mark Treating Kerney Hopfensperger/Extender: Linwood DibblesSTONE III, HOYT Weeks in Treatment: 0 Visit Information Patient Arrived: Ambulatory Arrival Time: 14:54 Accompanied By: wife Transfer Assistance: None Patient Identification Verified: Yes Secondary Verification Process Completed: Yes Electronic Signature(s) Signed: 09/08/2018 3:50:02 PM By: Arnette NorrisBiell, Kristina Entered By: Arnette NorrisBiell, Kristina on 09/04/2018 14:54:46 George Hensley, George F. ([id number]) -------------------------------------------------------------------------------- Clinic Level of Care Assessment Details Patient Name: George Hensley, Mark F. Date of Service: 09/04/2018 2:30 PM Medical Record Number: [id number] Patient Account Number: [id number] Date of Birth/Sex: Oct 29, 1930 [age] y.o. Male) Treating RN: Curtis Sitesorthy, Joanna Primary Care Jiyaan Steinhauser: Bethann PunchesMiller, Mark Other Clinician: Referring Alaiya Martindelcampo: Bethann PunchesMiller,  Mark Treating George Gripp/Extender: Linwood DibblesSTONE III, HOYT Weeks in Treatment: 0 Clinic Level of Care Assessment Items TOOL 1 Quantity Score []  - Use when EandM and Procedure is performed on INITIAL visit 0 ASSESSMENTS - Nursing Assessment / Reassessment X - General Physical Exam (combine w/ comprehensive assessment (listed just below) when 1 20 performed on new pt. evals) X- 1 25 Comprehensive Assessment (HX, ROS, Risk Assessments, Wounds Hx, etc.) ASSESSMENTS - Wound and Skin Assessment / Reassessment []  - Dermatologic / Skin Assessment (not related to wound area) 0 ASSESSMENTS - Ostomy and/or Continence Assessment and Care []  - Incontinence Assessment and Management 0 []  - 0 Ostomy Care Assessment and Management (repouching, etc.) PROCESS - Coordination of Care X - Simple Patient / Family Education for ongoing care 1 15 []  - 0 Complex (extensive) Patient / Family Education for ongoing care X- 1 10 Staff obtains ChiropractorConsents, Records, Test Results / Process Orders []  - 0 Staff telephones HHA, Nursing Homes / Clarify orders / etc []  - 0 Routine Transfer to another Facility (non-emergent condition) []  - 0 Routine Hospital Admission (non-emergent condition) X- 1 15 New Admissions / Manufacturing engineernsurance Authorizations / Ordering NPWT, Apligraf, etc. []  - 0 Emergency Hospital Admission (emergent condition) PROCESS - Special Needs []  - Pediatric / Minor Patient Management 0 []  - 0 Isolation Patient Management []  - 0 Hearing / Language / Visual special needs []  - 0 Assessment of Community assistance (transportation, D/C planning, etc.) []  - 0 Additional assistance / Altered mentation []  - 0 Support Surface(s) Assessment (bed, cushion, seat, etc.) George Hensley, George F. ([id number]) INTERVENTIONS - Miscellaneous []  - External ear exam 0 []  - 0 Patient Transfer (multiple staff / Nurse, adultHoyer Lift / Similar devices) []  - 0 Simple Staple / Suture removal (25 or less) []  - 0 Complex Staple / Suture removal (26  or more) []  - 0 Hypo/Hyperglycemic Management (do not check if billed separately) []  - 0 Ankle / Brachial Index (ABI) - do not check if billed separately Has the patient been seen at the hospital within the last three years: Yes Total Score: 85 Level Of Care: New/Established - Level  3 Electronic Signature(s) Signed: 09/04/2018 5:07:23 PM By: Curtis Sites Entered By: Curtis Sites on 09/04/2018 16:00:24 George Hensley ([id number]) -------------------------------------------------------------------------------- Encounter Discharge Information Details Patient Name: George Hensley. Date of Service: 09/04/2018 2:30 PM Medical Record Number: [id number] Patient Account Number: [id number] Date of Birth/Sex: 12-19-1930 (85 y.o. Male) Treating RN: Curtis Sites Primary Care Krishna Heuer: Bethann Punches Other Clinician: Referring Ericha Whittingham: Bethann Punches Treating Swayze Kozuch/Extender: Linwood Dibbles, HOYT Weeks in Treatment: 0 Encounter Discharge Information Items Post Procedure Vitals Discharge Condition: Stable Temperature (F): 98.1 Ambulatory Status: Ambulatory Pulse (bpm): 91 Discharge Destination: Home Respiratory Rate (breaths/min): 16 Transportation: Private Auto Blood Pressure (mmHg): 110/63 Accompanied By: spouse Schedule Follow-up Appointment: Yes Clinical Summary of Care: Electronic Signature(s) Signed: 09/04/2018 5:07:23 PM By: Curtis Sites Entered By: Curtis Sites on 09/04/2018 16:17:04 George Hensley ([id number]) -------------------------------------------------------------------------------- Lower Extremity Assessment Details Patient Name: George Hensley. Date of Service: 09/04/2018 2:30 PM Medical Record Number: [id number] Patient Account Number: [id number] Date of Birth/Sex: Jan 08, 1931 (85 y.o. Male) Treating RN: Arnette Norris Primary Care Nabeel Gladson: Bethann Punches Other Clinician: Referring Tyton Abdallah: Bethann Punches Treating Devonte Migues/Extender: Linwood Dibbles, HOYT Weeks in  Treatment: 0 Edema Assessment Assessed: [Left: No] [Right: No] Edema: [Left: Ye] [Right: s] Calf Left: Right: Point of Measurement: 36 cm From Medial Instep 32.3 cm 33 cm Ankle Left: Right: Point of Measurement: 12 cm From Medial Instep 25 cm 25 cm Vascular Assessment Pulses: Dorsalis Pedis Palpable: [Right:Yes] Posterior Tibial Palpable: [Left:No] [Right:No] Extremity colors, hair growth, and conditions: Extremity Color: [Left:Normal] [Right:Red] Hair Growth on Extremity: [Left:Yes] [Right:Yes] Temperature of Extremity: [Left:Warm] [Right:Warm] Capillary Refill: [Left:< 3 seconds] [Right:< 3 seconds] Toe Nail Assessment Left: Right: Thick: No No Discolored: No No Deformed: No No Improper Length and Hygiene: No No Electronic Signature(s) Signed: 09/08/2018 3:50:02 PM By: Arnette Norris Entered By: Arnette Norris on 09/04/2018 15:16:22 George Hensley ([id number]) -------------------------------------------------------------------------------- Multi Wound Chart Details Patient Name: George Hensley. Date of Service: 09/04/2018 2:30 PM Medical Record Number: [id number] Patient Account Number: [id number] Date of Birth/Sex: 12/22/1930 (85 y.o. Male) Treating RN: Curtis Sites Primary Care Kalib Bhagat: Bethann Punches Other Clinician: Referring Natassja George: Bethann Punches Treating Decklyn Hornik/Extender: Linwood Dibbles, HOYT Weeks in Treatment: 0 Vital Signs Height(in): 70 Pulse(bpm): 91 Weight(lbs): 160 Blood Pressure(mmHg): 110/63 Body Mass Index(BMI): 23 Temperature(F): 98.1 Respiratory Rate 18 (breaths/min): Photos: [1:No Photos] [2:No Photos] [3:No Photos] Wound Location: [1:Left Elbow] [2:Left Forearm - Proximal] [3:Left Forearm - Distal] Wounding Event: [1:Trauma] [2:Trauma] [3:Trauma] Primary Etiology: [1:Trauma, Other] [2:Trauma, Other] [3:Trauma, Other] Comorbid History: [1:Asthma, Hypertension, Type II Diabetes] [2:Asthma, Hypertension, Type II Diabetes] [3:Asthma,  Hypertension, Type II Diabetes] Date Acquired: [1:08/30/2018] [2:08/30/2018] [3:08/30/2018] Weeks of Treatment: [1:0] [2:0] [3:0] Wound Status: [1:Open] [2:Open] [3:Open] Clustered Wound: [1:No] [2:No] [3:No] Clustered Quantity: [1:N/A] [2:N/A] [3:N/A] Measurements L x W x D [1:3.5x4x0.1] [2:2x2.1x0.1] [3:3.5x3x0.1] (cm) Area (cm) : [1:10.996] [2:3.299] [3:8.247] Volume (cm) : [1:1.1] [2:0.33] [3:0.825] % Reduction in Area: [1:N/A] [2:N/A] [3:N/A] % Reduction in Volume: [1:N/A] [2:N/A] [3:N/A] Classification: [1:Full Thickness Without Exposed Support Structures] [2:Full Thickness Without Exposed Support Structures] [3:Full Thickness Without Exposed Support Structures] Exudate Amount: [1:Large] [2:Large] [3:Large] Exudate Type: [1:Serosanguineous] [2:Serosanguineous] [3:Serosanguineous] Exudate Color: [1:red, brown] [2:red, brown] [3:red, brown] Wound Margin: [1:N/A] [2:Flat and Intact] [3:Flat and Intact] Granulation Amount: [1:Small (1-33%)] [2:Small (1-33%)] [3:Small (1-33%)] Granulation Quality: [1:Pale] [2:Pink] [3:Pink] Necrotic Amount: [1:Large (67-100%)] [2:Medium (34-66%)] [3:Large (67-100%)] Necrotic Tissue: [1:Eschar, Adherent Slough] [2:Eschar] [3:Eschar, Adherent Slough] Exposed Structures: [1:Fat Layer (Subcutaneous Tissue) Exposed: Yes Fascia: No Tendon: No Muscle: No Joint: No Bone:  No] [2:Fat Layer (Subcutaneous Tissue) Exposed: Yes Fascia: No Tendon: No Muscle: No Joint: No Bone: No] [3:Fat Layer (Subcutaneous Tissue) Exposed: Yes  Fascia: No Tendon: No Muscle: No Joint: No Bone: No] Epithelialization: [1:None] [2:None] [3:None] Periwound Skin Texture: [1:No Abnormalities Noted] [2:Excoriation: No Induration: No] [3:No Abnormalities Noted] Callus: No Crepitus: No Rash: No Scarring: No Periwound Skin Moisture: No Abnormalities Noted Maceration: No No Abnormalities Noted Dry/Scaly: No Periwound Skin Color: Hemosiderin Staining: Yes Hemosiderin Staining:  Yes Hemosiderin Staining: Yes Atrophie Blanche: No Cyanosis: No Ecchymosis: No Erythema: No Mottled: No Pallor: No Rubor: No Erythema Location: N/A N/A N/A Temperature: No Abnormality No Abnormality No Abnormality Tenderness on Palpation: Yes Yes Yes Wound Preparation: Ulcer Cleansing: Ulcer Cleansing: Ulcer Cleansing: Rinsed/Irrigated with Saline, Rinsed/Irrigated with Saline, Rinsed/Irrigated with Saline, Other: soap and water Other: soap and water Other: soap and water Topical Anesthetic Applied: Topical Anesthetic Applied: Topical Anesthetic Applied: Other: lidocaine 4% Other: lidocaine 4% Other: lidocaine 4% Wound Number: [id number]  Photos: No Photos No Photos No Photos Wound Location: Left Wrist Left Hand - 5th Digit Left Knee - Lateral Wounding Event: Trauma Trauma Trauma Primary Etiology: Trauma, Other Trauma, Other Trauma, Other Comorbid History: Asthma, Hypertension, Type II Asthma, Hypertension, Type II Asthma, Hypertension, Type II Diabetes Diabetes Diabetes Date Acquired: 08/30/2018 08/30/2018 08/30/2018 Weeks of Treatment: 0 0 0 Wound Status: Open Open Open Clustered Wound: No No No Clustered Quantity: N/A N/A N/A Measurements L x W x D 1.6x1.2x0.1 0.1x1x0.1 2x1x0.1 (cm) Area (cm) : 1.508 0.079 1.571 Volume (cm) : 0.151 0.008 0.157 % Reduction in Area: N/A N/A N/A % Reduction in Volume: N/A N/A N/A Classification: Full Thickness Without Partial Thickness Full Thickness Without Exposed Support Structures Exposed Support Structures Exudate Amount: Medium Medium Large Exudate Type: Serosanguineous Serosanguineous Serosanguineous Exudate Color: red, brown red, brown red, brown Wound Margin: Flat and Intact Flat and Intact Flat and Intact Granulation Amount: Small (1-33%) Small (1-33%) Small (1-33%) Granulation Quality: Pale Pink Pink Necrotic Amount: Large (67-100%) Large (67-100%) Large (67-100%) Necrotic Tissue: Eschar, Adherent Slough Eschar, Adherent Slough  Eschar, Adherent Slough Exposed Structures: Fat Layer (Subcutaneous Fat Layer (Subcutaneous Fat Layer (Subcutaneous Tissue) Exposed: Yes Tissue) Exposed: Yes Tissue) Exposed: Yes Fascia: No Fascia: No Fascia: No Tendon: No Tendon: No Tendon: No Muscle: No Muscle: No Muscle: No DAISHON, CHUI ([id number]) Joint: No Joint: No Joint: No Bone: No Bone: No Bone: No Epithelialization: None None None Periwound Skin Texture: Excoriation: No Excoriation: No Excoriation: No Induration: No Induration: No Induration: No Callus: No Callus: No Callus: No Crepitus: No Crepitus: No Crepitus: No Rash: No Rash: No Rash: No Scarring: No Scarring: No Scarring: No Periwound Skin Moisture: Maceration: No Maceration: No Maceration: No Dry/Scaly: No Dry/Scaly: No Dry/Scaly: No Periwound Skin Color: Hemosiderin Staining: Yes Hemosiderin Staining: Yes Hemosiderin Staining: Yes Atrophie Blanche: No Atrophie Blanche: No Atrophie Blanche: No Cyanosis: No Cyanosis: No Cyanosis: No Ecchymosis: No Ecchymosis: No Ecchymosis: No Erythema: No Erythema: No Erythema: No Mottled: No Mottled: No Mottled: No Pallor: No Pallor: No Pallor: No Rubor: No Rubor: No Rubor: No Erythema Location: N/A N/A N/A Temperature: No Abnormality No Abnormality No Abnormality Tenderness on Palpation: Yes Yes Yes Wound Preparation: Ulcer Cleansing: Ulcer Cleansing: Ulcer Cleansing: Rinsed/Irrigated with Saline, Rinsed/Irrigated with Saline, Rinsed/Irrigated with Saline, Other: soap and water Other Other: soap and water Topical Anesthetic Applied: Topical Anesthetic Applied: Topical Anesthetic Applied: Other: lidocaine 4% Other: lidocaine 4% Other: lidocaine 4% Wound Number: [id number] N/A Photos: No Photos No Photos  N/A Wound Location: Left Knee Right Knee N/A Wounding Event: Trauma Trauma N/A Primary Etiology: Trauma, Other Trauma, Other N/A Comorbid History: Asthma, Hypertension, Type II  Asthma, Hypertension, Type II N/A Diabetes Diabetes Date Acquired: 08/30/2018 08/30/2018 N/A Weeks of Treatment: 0 0 N/A Wound Status: Open Open N/A Clustered Wound: Yes No N/A Clustered Quantity: 2 N/A N/A Measurements L x W x D 3x3x0.1 1.2x1.2x0.1 N/A (cm) Area (cm) : 7.069 1.131 N/A Volume (cm) : 0.707 0.113 N/A % Reduction in Area: 0.00% N/A N/A % Reduction in Volume: 0.00% N/A N/A Classification: Full Thickness Without Full Thickness Without N/A Exposed Support Structures Exposed Support Structures Exudate Amount: Large Medium N/A Exudate Type: Serosanguineous Serosanguineous N/A Exudate Color: red, brown red, brown N/A Wound Margin: Flat and Intact Flat and Intact N/A Granulation Amount: N/A Small (1-33%) N/A Granulation Quality: N/A Pink N/A Necrotic Amount: N/A Large (67-100%) N/A Necrotic Tissue: Adherent Slough Eschar, Adherent Slough N/A Tobby, Fawcett Greenwood. ([id number]) Exposed Structures: Fat Layer (Subcutaneous Fat Layer (Subcutaneous N/A Tissue) Exposed: Yes Tissue) Exposed: Yes Fascia: No Fascia: No Tendon: No Tendon: No Muscle: No Muscle: No Joint: No Joint: No Bone: No Bone: No Epithelialization: None None N/A Periwound Skin Texture: Excoriation: No Excoriation: No N/A Induration: No Induration: No Callus: No Callus: No Crepitus: No Crepitus: No Rash: No Rash: No Scarring: No Scarring: No Periwound Skin Moisture: Maceration: No Maceration: No N/A Dry/Scaly: No Dry/Scaly: No Periwound Skin Color: Erythema: Yes Atrophie Blanche: No N/A Atrophie Blanche: No Cyanosis: No Cyanosis: No Ecchymosis: No Ecchymosis: No Erythema: No Hemosiderin Staining: No Hemosiderin Staining: No Mottled: No Mottled: No Pallor: No Pallor: No Rubor: No Rubor: No Erythema Location: Circumferential N/A N/A Temperature: No Abnormality No Abnormality N/A Tenderness on Palpation: Yes Yes N/A Wound Preparation: Ulcer Cleansing: Ulcer Cleansing:  N/A Rinsed/Irrigated with Saline, Rinsed/Irrigated with Saline, Other: soap and water Other: soap and water Topical Anesthetic Applied: Topical Anesthetic Applied: Other: lidocaine 4% Other: lidocaine 4% Treatment Notes Electronic Signature(s) Signed: 09/04/2018 5:07:23 PM By: Curtis Sites Entered By: Curtis Sites on 09/04/2018 15:50:10 George Hensley ([id number]) -------------------------------------------------------------------------------- Multi-Disciplinary Care Plan Details Patient Name: TELLER, WAKEFIELD. Date of Service: 09/04/2018 2:30 PM Medical Record Number: [id number] Patient Account Number: [id number] Date of Birth/Sex: 10/24/1930 (85 y.o. Male) Treating RN: Curtis Sites Primary Care Eriana Suliman: Bethann Punches Other Clinician: Referring Rayner Erman: Bethann Punches Treating [place]opal/Extender: Linwood Dibbles, HOYT Weeks in Treatment: 0 Active Inactive Abuse / Safety / Falls / Self Care Management Nursing Diagnoses: History of Falls Goals: Patient will not experience any injury related to falls Date Initiated: 09/04/2018 Target Resolution Date: 11/29/2018 Goal Status: Active Interventions: Assess fall risk on admission and as needed Notes: Orientation to the Wound Care Program Nursing Diagnoses: Knowledge deficit related to the wound healing center program Goals: Patient/caregiver will verbalize understanding of the Wound Healing Center Program Date Initiated: 09/04/2018 Target Resolution Date: 11/29/2018 Goal Status: Active Interventions: Provide education on orientation to the wound center Notes: Wound/Skin Impairment Nursing Diagnoses: Impaired tissue integrity Goals: Ulcer/skin breakdown will heal within 14 weeks Date Initiated: 09/04/2018 Target Resolution Date: 11/29/2018 Goal Status: Active Interventions: Assess patient/caregiver ability to obtain necessary supplies PRATHER, FAILLA ([id number]) Assess patient/caregiver ability to perform ulcer/skin care regimen  upon admission and as needed Assess ulceration(s) every visit Notes: Electronic Signature(s) Signed: 09/04/2018 5:07:23 PM By: Curtis Sites Entered By: Curtis Sites on 09/04/2018 15:49:56 George Hensley ([id number]) -------------------------------------------------------------------------------- Pain Assessment Details Patient Name: George Hensley. Date of Service: 09/04/2018 2:30 PM Medical Record Number:  [id number] Patient Account Number: [id number] Date of Birth/Sex: 1931/03/13 (85 y.o. Male) Treating RN: Arnette Norris Primary Care Mandeep Ferch: Bethann Punches Other Clinician: Referring Marquette Blodgett: Bethann Punches Treating Idara Woodside/Extender: Linwood Dibbles, HOYT Weeks in Treatment: 0 Active Problems Location of Pain Severity and Description of Pain Patient Has Paino No Site Locations Pain Management and Medication Current Pain Management: Electronic Signature(s) Signed: 09/08/2018 3:50:02 PM By: Arnette Norris Entered By: Arnette Norris on 09/04/2018 14:55:11 George Hensley ([id number]) -------------------------------------------------------------------------------- Patient/Caregiver Education Details Patient Name: BRINSON, TOZZI. Date of Service: 09/04/2018 2:30 PM Medical Record Number: [id number] Patient Account Number: [id number] Date of Birth/Gender: 02-12-1931 (85 y.o. Male) Treating RN: Curtis Sites Primary Care Physician: Bethann Punches Other Clinician: Referring Physician: Bethann Punches Treating Physician/Extender: Linwood Dibbles, HOYT Weeks in Treatment: 0 Education Assessment Education Provided To: Patient and Caregiver Education Topics Provided Wound/Skin Impairment: Handouts: Other: wound care as ordered Methods: Demonstration, Explain/Verbal Responses: State content correctly Electronic Signature(s) Signed: 09/04/2018 5:07:23 PM By: Curtis Sites Entered By: Curtis Sites on 09/04/2018 16:12:27 George Hensley  ([id number]) -------------------------------------------------------------------------------- Wound Assessment Details Patient Name: SRIANSH, FARRA. Date of Service: 09/04/2018 2:30 PM Medical Record Number: [id number] Patient Account Number: [id number] Date of Birth/Sex: 12-Feb-1931 (85 y.o. Male) Treating RN: Arnette Norris Primary Care Brittney Mucha: Bethann Punches Other Clinician: Referring Spruha Weight: Bethann Punches Treating Peja Allender/Extender: Linwood Dibbles, HOYT Weeks in Treatment: 0 Wound Status Wound Number: 1 Primary Etiology: Trauma, Other Wound Location: Left Elbow Wound Status: Open Wounding Event: Trauma Comorbid History: Asthma, Hypertension, Type II Diabetes Date Acquired: 08/30/2018 Weeks Of Treatment: 0 Clustered Wound: No Photos Photo Uploaded By: Elliot Gurney, BSN, RN, CWS, Kim on 09/05/2018 14:38:31 Wound Measurements Length: (cm) 3.5 Width: (cm) 4 Depth: (cm) 0.1 Area: (cm) 10.996 Volume: (cm) 1.1 % Reduction in Area: % Reduction in Volume: Epithelialization: None Tunneling: No Undermining: No Wound Description Full Thickness Without Exposed Support Foul Classification: Structures Sloug Exudate Large Amount: Exudate Type: Serosanguineous Exudate Color: red, brown Odor After Cleansing: No h/Fibrino Yes Wound Bed Granulation Amount: Small (1-33%) Exposed Structure Granulation Quality: Pale Fascia Exposed: No Necrotic Amount: Large (67-100%) Fat Layer (Subcutaneous Tissue) Exposed: Yes Necrotic Quality: Eschar, Adherent Slough Tendon Exposed: No Muscle Exposed: No Joint Exposed: No Bone Exposed: No Periwound Skin Texture SEBERT, STOLLINGS ([id number]) Texture Color No Abnormalities Noted: No No Abnormalities Noted: No Hemosiderin Staining: Yes Moisture No Abnormalities Noted: No Temperature / Pain Temperature: No Abnormality Tenderness on Palpation: Yes Wound Preparation Ulcer Cleansing: Rinsed/Irrigated with Saline, Other: soap and water, Topical  Anesthetic Applied: Other: lidocaine 4%, Treatment Notes Wound #1 (Left Elbow) Notes xeroform on all wounds, L arm - non adherent pad, abd,conform and netting; L hand and wrist - coverlets; bilateral knees - bordered foam dressing Electronic Signature(s) Signed: 09/08/2018 3:50:02 PM By: Arnette Norris Entered By: Arnette Norris on 09/04/2018 15:22:59 George Hensley ([id number]) -------------------------------------------------------------------------------- Wound Assessment Details Patient Name: George Hensley. Date of Service: 09/04/2018 2:30 PM Medical Record Number: [id number] Patient Account Number: [id number] Date of Birth/Sex: December 18, 1930 (85 y.o. Male) Treating RN: Arnette Norris Primary Care Tiyonna Sardinha: Bethann Punches Other Clinician: Referring Janaki Exley: Bethann Punches Treating Brance Dartt/Extender: STONE III, HOYT Weeks in Treatment: 0 Wound Status Wound Number: 2 Primary Etiology: Trauma, Other Wound Location: Left Forearm - Proximal Wound Status: Open Wounding Event: Trauma Comorbid History: Asthma, Hypertension, Type II Diabetes Date Acquired: 08/30/2018 Weeks Of Treatment: 0 Clustered Wound: No Photos Photo Uploaded By: Elliot Gurney, BSN, RN, CWS, Kim on 09/05/2018 14:38:31 Wound Measurements Length: (cm) 2 Width: (cm) 2.1 Depth: (cm)  0.1 Area: (cm) 3.299 Volume: (cm) 0.33 % Reduction in Area: % Reduction in Volume: Epithelialization: None Tunneling: No Undermining: No Wound Description Full Thickness Without Exposed Support Foul Odo Classification: Structures Slough/F Wound Margin: Flat and Intact Exudate Large Amount: Exudate Type: Serosanguineous Exudate Color: red, brown r After Cleansing: No ibrino Yes Wound Bed Granulation Amount: Small (1-33%) Exposed Structure Granulation Quality: Pink Fascia Exposed: No Necrotic Amount: Medium (34-66%) Fat Layer (Subcutaneous Tissue) Exposed: Yes Necrotic Quality: Eschar Tendon Exposed: No Muscle Exposed:  No Joint Exposed: No Bone Exposed: No TRAMAYNE, SEBESTA ([id number]) Periwound Skin Texture Texture Color No Abnormalities Noted: No No Abnormalities Noted: No Callus: No Atrophie Blanche: No Crepitus: No Cyanosis: No Excoriation: No Ecchymosis: No Induration: No Erythema: No Rash: No Hemosiderin Staining: Yes Scarring: No Mottled: No Pallor: No Moisture Rubor: No No Abnormalities Noted: No Dry / Scaly: No Temperature / Pain Maceration: No Temperature: No Abnormality Tenderness on Palpation: Yes Wound Preparation Ulcer Cleansing: Rinsed/Irrigated with Saline, Other: soap and water, Topical Anesthetic Applied: Other: lidocaine 4%, Treatment Notes Wound #2 (Left, Proximal Forearm) Notes xeroform on all wounds, L arm - non adherent pad, abd,conform and netting; L hand and wrist - coverlets; bilateral knees - bordered foam dressing Electronic Signature(s) Signed: 09/08/2018 3:50:02 PM By: Arnette Norris Entered By: Arnette Norris on 09/04/2018 15:25:29 George Hensley ([id number]) -------------------------------------------------------------------------------- Wound Assessment Details Patient Name: George Hensley. Date of Service: 09/04/2018 2:30 PM Medical Record Number: [id number] Patient Account Number: [id number] Date of Birth/Sex: 1930-10-20 (85 y.o. Male) Treating RN: Arnette Norris Primary Care Kelsa Jaworowski: Bethann Punches Other Clinician: Referring Donovan Persley: Bethann Punches Treating Shlomie Romig/Extender: Linwood Dibbles, HOYT Weeks in Treatment: 0 Wound Status Wound Number: 3 Primary Etiology: Trauma, Other Wound Location: Left Forearm - Distal Wound Status: Open Wounding Event: Trauma Comorbid History: Asthma, Hypertension, Type II Diabetes Date Acquired: 08/30/2018 Weeks Of Treatment: 0 Clustered Wound: No Photos Photo Uploaded By: Elliot Gurney, BSN, RN, CWS, Kim on 09/05/2018 14:39:15 Wound Measurements Length: (cm) 3.5 Width: (cm) 3 Depth: (cm) 0.1 Area: (cm)  8.247 Volume: (cm) 0.825 % Reduction in Area: % Reduction in Volume: Epithelialization: None Tunneling: No Undermining: No Wound Description Full Thickness Without Exposed Support Foul Odo Classification: Structures Slough/F Wound Margin: Flat and Intact Exudate Large Amount: Exudate Type: Serosanguineous Exudate Color: red, brown r After Cleansing: No ibrino Yes Wound Bed Granulation Amount: Small (1-33%) Exposed Structure Granulation Quality: Pink Fascia Exposed: No Necrotic Amount: Large (67-100%) Fat Layer (Subcutaneous Tissue) Exposed: Yes Necrotic Quality: Eschar, Adherent Slough Tendon Exposed: No Muscle Exposed: No Joint Exposed: No Bone Exposed: No VAIBHAV, FOGLEMAN ([id number]) Periwound Skin Texture Texture Color No Abnormalities Noted: No No Abnormalities Noted: No Hemosiderin Staining: Yes Moisture No Abnormalities Noted: No Temperature / Pain Temperature: No Abnormality Tenderness on Palpation: Yes Wound Preparation Ulcer Cleansing: Rinsed/Irrigated with Saline, Other: soap and water, Topical Anesthetic Applied: Other: lidocaine 4%, Treatment Notes Wound #3 (Left, Distal Forearm) Notes xeroform on all wounds, L arm - non adherent pad, abd,conform and netting; L hand and wrist - coverlets; bilateral knees - bordered foam dressing Electronic Signature(s) Signed: 09/08/2018 3:50:02 PM By: Arnette Norris Entered By: Arnette Norris on 09/04/2018 15:27:49 George Hensley ([id number]) -------------------------------------------------------------------------------- Wound Assessment Details Patient Name: George Hensley. Date of Service: 09/04/2018 2:30 PM Medical Record Number: [id number] Patient Account Number: [id number] Date of Birth/Sex: October 03, 1930 (85 y.o. Male) Treating RN: Arnette Norris Primary Care Jaylei Fuerte: Bethann Punches Other Clinician: Referring Cammeron Greis: Bethann Punches Treating Rossy Virag/Extender: STONE III, HOYT Weeks in Treatment:  0 Wound Status Wound Number: 4 Primary Etiology: Trauma, Other Wound Location: Left Wrist Wound Status: Open Wounding Event: Trauma Comorbid History: Asthma, Hypertension, Type II Diabetes Date Acquired: 08/30/2018 Weeks Of Treatment: 0 Clustered Wound: No Photos Photo Uploaded By: Elliot GurneyWoody, BSN, RN, CWS, Kim on 09/05/2018 14:39:15 Wound Measurements Length: (cm) 1.6 Width: (cm) 1.2 Depth: (cm) 0.1 Area: (cm) 1.508 Volume: (cm) 0.151 % Reduction in Area: % Reduction in Volume: Epithelialization: None Tunneling: No Undermining: No Wound Description Full Thickness Without Exposed Support Foul Odo Classification: Structures Slough/F Wound Margin: Flat and Intact Exudate Medium Amount: Exudate Type: Serosanguineous Exudate Color: red, brown r After Cleansing: No ibrino Yes Wound Bed Granulation Amount: Small (1-33%) Exposed Structure Granulation Quality: Pale Fascia Exposed: No Necrotic Amount: Large (67-100%) Fat Layer (Subcutaneous Tissue) Exposed: Yes Necrotic Quality: Eschar, Adherent Slough Tendon Exposed: No Muscle Exposed: No Joint Exposed: No Bone Exposed: No George Hensley, George F. ([id number]) Periwound Skin Texture Texture Color No Abnormalities Noted: No No Abnormalities Noted: No Callus: No Atrophie Blanche: No Crepitus: No Cyanosis: No Excoriation: No Ecchymosis: No Induration: No Erythema: No Rash: No Hemosiderin Staining: Yes Scarring: No Mottled: No Pallor: No Moisture Rubor: No No Abnormalities Noted: No Dry / Scaly: No Temperature / Pain Maceration: No Temperature: No Abnormality Tenderness on Palpation: Yes Wound Preparation Ulcer Cleansing: Rinsed/Irrigated with Saline, Other: soap and water, Topical Anesthetic Applied: Other: lidocaine 4%, Treatment Notes Wound #4 (Left Wrist) Notes xeroform on all wounds, L arm - non adherent pad, abd,conform and netting; L hand and wrist - coverlets; bilateral knees - bordered foam  dressing Electronic Signature(s) Signed: 09/08/2018 3:50:02 PM By: Arnette NorrisBiell, Kristina Entered By: Arnette NorrisBiell, Kristina on 09/04/2018 15:29:59 George Hensley, George F. ([id number]) -------------------------------------------------------------------------------- Wound Assessment Details Patient Name: George Hensley, George F. Date of Service: 09/04/2018 2:30 PM Medical Record Number: [id number] Patient Account Number: [id number] Date of Birth/Sex: Jun 23, 1931 [age] y.o. Male) Treating RN: Arnette NorrisBiell, Kristina Primary Care Johm Pfannenstiel: Bethann PunchesMiller, Mark Other Clinician: Referring Magdelena Kinsella: Bethann PunchesMiller, Mark Treating Takiera Mayo/Extender: Linwood DibblesSTONE III, HOYT Weeks in Treatment: 0 Wound Status Wound Number: 5 Primary Etiology: Trauma, Other Wound Location: Left Hand - 5th Digit Wound Status: Open Wounding Event: Trauma Comorbid History: Asthma, Hypertension, Type II Diabetes Date Acquired: 08/30/2018 Weeks Of Treatment: 0 Clustered Wound: No Photos Photo Uploaded By: Elliot GurneyWoody, BSN, RN, CWS, Kim on 09/05/2018 14:39:50 Wound Measurements Length: (cm) 0.1 Width: (cm) 1 Depth: (cm) 0.1 Area: (cm) 0.079 Volume: (cm) 0.008 % Reduction in Area: % Reduction in Volume: Epithelialization: None Tunneling: No Undermining: No Wound Description Classification: Partial Thickness Foul Odor Wound Margin: Flat and Intact Slough/Fi Exudate Amount: Medium Exudate Type: Serosanguineous Exudate Color: red, brown After Cleansing: No brino Yes Wound Bed Granulation Amount: Small (1-33%) Exposed Structure Granulation Quality: Pink Fascia Exposed: No Necrotic Amount: Large (67-100%) Fat Layer (Subcutaneous Tissue) Exposed: Yes Necrotic Quality: Eschar, Adherent Slough Tendon Exposed: No Muscle Exposed: No Joint Exposed: No Bone Exposed: No Periwound Skin Texture George Hensley, George F. ([id number]) Texture Color No Abnormalities Noted: No No Abnormalities Noted: No Callus: No Atrophie Blanche: No Crepitus: No Cyanosis: No Excoriation:  No Ecchymosis: No Induration: No Erythema: No Rash: No Hemosiderin Staining: Yes Scarring: No Mottled: No Pallor: No Moisture Rubor: No No Abnormalities Noted: No Dry / Scaly: No Temperature / Pain Maceration: No Temperature: No Abnormality Tenderness on Palpation: Yes Wound Preparation Ulcer Cleansing: Rinsed/Irrigated with Saline, Other Topical Anesthetic Applied: Other: lidocaine 4%, Treatment Notes Wound #5 (Left Hand - 5th Digit) Notes xeroform on all wounds, L arm - non adherent pad,  abd,conform and netting; L hand and wrist - coverlets; bilateral knees - bordered foam dressing Electronic Signature(s) Signed: 09/08/2018 3:50:02 PM By: Arnette Norris Entered By: Arnette Norris on 09/04/2018 15:32:04 George Hensley ([id number]) -------------------------------------------------------------------------------- Wound Assessment Details Patient Name: GIANG, HEMME. Date of Service: 09/04/2018 2:30 PM Medical Record Number: [id number] Patient Account Number: [id number] Date of Birth/Sex: 1931-07-18 (85 y.o. Male) Treating RN: Arnette Norris Primary Care Khaleel Beckom: Bethann Punches Other Clinician: Referring Kwanza Cancelliere: Bethann Punches Treating Aberdeen Hafen/Extender: Linwood Dibbles, HOYT Weeks in Treatment: 0 Wound Status Wound Number: 6 Primary Etiology: Trauma, Other Wound Location: Left Knee - Lateral Wound Status: Open Wounding Event: Trauma Comorbid History: Asthma, Hypertension, Type II Diabetes Date Acquired: 08/30/2018 Weeks Of Treatment: 0 Clustered Wound: No Photos Photo Uploaded By: Elliot Gurney, BSN, RN, CWS, Kim on 09/05/2018 14:39:51 Wound Measurements Length: (cm) 2 Width: (cm) 1 Depth: (cm) 0.1 Area: (cm) 1.571 Volume: (cm) 0.157 % Reduction in Area: % Reduction in Volume: Epithelialization: None Tunneling: No Undermining: No Wound Description Full Thickness Without Exposed Support Foul Odo Classification: Structures Slough/F Wound Margin: Flat and  Intact Exudate Large Amount: Exudate Type: Serosanguineous Exudate Color: red, brown r After Cleansing: No ibrino Yes Wound Bed Granulation Amount: Small (1-33%) Exposed Structure Granulation Quality: Pink Fascia Exposed: No Necrotic Amount: Large (67-100%) Fat Layer (Subcutaneous Tissue) Exposed: Yes Necrotic Quality: Eschar, Adherent Slough Tendon Exposed: No Muscle Exposed: No Joint Exposed: No Bone Exposed: No JDYN, PARKERSON ([id number]) Periwound Skin Texture Texture Color No Abnormalities Noted: No No Abnormalities Noted: No Callus: No Atrophie Blanche: No Crepitus: No Cyanosis: No Excoriation: No Ecchymosis: No Induration: No Erythema: No Rash: No Hemosiderin Staining: Yes Scarring: No Mottled: No Pallor: No Moisture Rubor: No No Abnormalities Noted: No Dry / Scaly: No Temperature / Pain Maceration: No Temperature: No Abnormality Tenderness on Palpation: Yes Wound Preparation Ulcer Cleansing: Rinsed/Irrigated with Saline, Other: soap and water, Topical Anesthetic Applied: Other: lidocaine 4%, Treatment Notes Wound #6 (Left, Lateral Knee) Notes xeroform on all wounds, L arm - non adherent pad, abd,conform and netting; L hand and wrist - coverlets; bilateral knees - bordered foam dressing Electronic Signature(s) Signed: 09/08/2018 3:50:02 PM By: Arnette Norris Entered By: Arnette Norris on 09/04/2018 15:34:15 George Hensley ([id number]) -------------------------------------------------------------------------------- Wound Assessment Details Patient Name: George Hensley. Date of Service: 09/04/2018 2:30 PM Medical Record Number: [id number] Patient Account Number: [id number] Date of Birth/Sex: 1931/07/03 (85 y.o. Male) Treating RN: Arnette Norris Primary Care Ishan Sanroman: Bethann Punches Other Clinician: Referring Islay Polanco: Bethann Punches Treating Magaret Justo/Extender: Linwood Dibbles, HOYT Weeks in Treatment: 0 Wound Status Wound Number: 7 Primary  Etiology: Trauma, Other Wound Location: Left Knee Wound Status: Open Wounding Event: Trauma Comorbid History: Asthma, Hypertension, Type II Diabetes Date Acquired: 08/30/2018 Weeks Of Treatment: 0 Clustered Wound: Yes Photos Photo Uploaded By: Elliot Gurney, BSN, RN, CWS, Kim on 09/05/2018 14:43:08 Wound Measurements Length: (cm) 3 Width: (cm) 3 Depth: (cm) 0.1 Clustered Quantity: 2 Area: (cm) 7.069 Volume: (cm) 0.707 % Reduction in Area: 0% % Reduction in Volume: 0% Epithelialization: None Tunneling: No Undermining: No Wound Description Full Thickness Without Exposed Support Foul Odo Classification: Structures Slough/F Wound Margin: Flat and Intact Exudate Large Amount: Exudate Type: Serosanguineous Exudate Color: red, brown r After Cleansing: No ibrino Yes Wound Bed Necrotic Amount: Medium (34-66%) Exposed Structure Necrotic Quality: Adherent Slough Fascia Exposed: No Fat Layer (Subcutaneous Tissue) Exposed: Yes Tendon Exposed: No Muscle Exposed: No Joint Exposed: No ANDRIAN, SABALA F. ([id number]) Bone Exposed: No Periwound Skin Texture Texture Color No Abnormalities Noted:  No No Abnormalities Noted: No Callus: No Atrophie Blanche: No Crepitus: No Cyanosis: No Excoriation: No Ecchymosis: No Induration: No Erythema: Yes Rash: No Erythema Location: Circumferential Scarring: No Hemosiderin Staining: No Mottled: No Moisture Pallor: No No Abnormalities Noted: No Rubor: No Dry / Scaly: No Maceration: No Temperature / Pain Temperature: No Abnormality Tenderness on Palpation: Yes Wound Preparation Ulcer Cleansing: Rinsed/Irrigated with Saline, Other: soap and water, Topical Anesthetic Applied: Other: lidocaine 4%, Treatment Notes Wound #7 (Left Knee) Notes xeroform on all wounds, L arm - non adherent pad, abd,conform and netting; L hand and wrist - coverlets; bilateral knees - bordered foam dressing Electronic Signature(s) Signed: 09/08/2018 3:50:02  PM By: Arnette Norris Entered By: Arnette Norris on 09/04/2018 15:37:12 George Hensley ([id number]) -------------------------------------------------------------------------------- Wound Assessment Details Patient Name: George Hensley. Date of Service: 09/04/2018 2:30 PM Medical Record Number: [id number] Patient Account Number: [id number] Date of Birth/Sex: 1930/11/24 (85 y.o. Male) Treating RN: Arnette Norris Primary Care Jhonathan Desroches: Bethann Punches Other Clinician: Referring Henson Fraticelli: Bethann Punches Treating Teren Zurcher/Extender: Linwood Dibbles, HOYT Weeks in Treatment: 0 Wound Status Wound Number: 8 Primary Etiology: Trauma, Other Wound Location: Right Knee Wound Status: Open Wounding Event: Trauma Comorbid History: Asthma, Hypertension, Type II Diabetes Date Acquired: 08/30/2018 Weeks Of Treatment: 0 Clustered Wound: No Photos Photo Uploaded By: Elliot Gurney, BSN, RN, CWS, Kim on 09/05/2018 14:43:08 Wound Measurements Length: (cm) 1.2 Width: (cm) 1.2 Depth: (cm) 0.1 Area: (cm) 1.131 Volume: (cm) 0.113 % Reduction in Area: % Reduction in Volume: Epithelialization: None Tunneling: No Undermining: No Wound Description Full Thickness Without Exposed Support Foul Odo Classification: Structures Slough/F Wound Margin: Flat and Intact Exudate Medium Amount: Exudate Type: Serosanguineous Exudate Color: red, brown r After Cleansing: No ibrino Yes Wound Bed Granulation Amount: Small (1-33%) Exposed Structure Granulation Quality: Pink Fascia Exposed: No Necrotic Amount: Large (67-100%) Fat Layer (Subcutaneous Tissue) Exposed: Yes Necrotic Quality: Eschar, Adherent Slough Tendon Exposed: No Muscle Exposed: No Joint Exposed: No Bone Exposed: No GRAFTON, LEHRKE ([id number]) Periwound Skin Texture Texture Color No Abnormalities Noted: No No Abnormalities Noted: No Callus: No Atrophie Blanche: No Crepitus: No Cyanosis: No Excoriation: No Ecchymosis: No Induration:  No Erythema: No Rash: No Hemosiderin Staining: No Scarring: No Mottled: No Pallor: No Moisture Rubor: No No Abnormalities Noted: No Dry / Scaly: No Temperature / Pain Maceration: No Temperature: No Abnormality Tenderness on Palpation: Yes Wound Preparation Ulcer Cleansing: Rinsed/Irrigated with Saline, Other: soap and water, Topical Anesthetic Applied: Other: lidocaine 4%, Treatment Notes Wound #8 (Right Knee) Notes xeroform on all wounds, L arm - non adherent pad, abd,conform and netting; L hand and wrist - coverlets; bilateral knees - bordered foam dressing Electronic Signature(s) Signed: 09/08/2018 3:50:02 PM By: Arnette Norris Entered By: Arnette Norris on 09/04/2018 15:38:46 George Hensley ([id number]) -------------------------------------------------------------------------------- Vitals Details Patient Name: George Hensley. Date of Service: 09/04/2018 2:30 PM Medical Record Number: [id number] Patient Account Number: [id number] Date of Birth/Sex: 1931/08/05 (85 y.o. Male) Treating RN: Arnette Norris Primary Care Jacelynn Hayton: Bethann Punches Other Clinician: Referring Annaliah Rivenbark: Bethann Punches Treating Jeffry Vogelsang/Extender: Linwood Dibbles, HOYT Weeks in Treatment: 0 Vital Signs Time Taken: 14:55 Temperature (F): 98.1 Height (in): 70 Pulse (bpm): 91 Source: Stated Respiratory Rate (breaths/min): 18 Weight (lbs): 160 Blood Pressure (mmHg): 110/63 Source: Stated Reference Range: 80 - 120 mg / dl Body Mass Index (BMI): 23 Electronic Signature(s) Signed: 09/08/2018 3:50:02 PM By: Arnette Norris Entered By: Arnette Norris on 09/04/2018 14:55:42

## 2018-09-08 NOTE — Progress Notes (Signed)
MELODY, KIRCHER ([id number]) Visit Report for 09/04/2018 Abuse/Suicide Risk Screen Details Patient Name: George Hensley, George Hensley. Date of Service: 09/04/2018 2:30 PM Medical Record Number: [id number] Patient Account Number: [id number] Date of Birth/Sex: 07/25/1931 (85 y.o. Male) Treating RN: Arnette Norris Primary Care Brandalynn Ofallon: Bethann Punches Other Clinician: Referring Geral Tuch: Bethann Punches Treating Salahuddin Arismendez/Extender: Linwood Dibbles, HOYT Weeks in Treatment: 0 Abuse/Suicide Risk Screen Items Answer ABUSE/SUICIDE RISK SCREEN: Has anyone close to you tried to hurt or harm you recentlyo No Do you feel uncomfortable with anyone in your familyo No Has anyone forced you do things that you didnot want to doo No Do you have any thoughts of harming yourselfo No Patient displays signs or symptoms of abuse and/or neglect. No Electronic Signature(s) Signed: 09/08/2018 3:50:02 PM By: Arnette Norris Entered By: Arnette Norris on 09/04/2018 15:10:26 George Hensley ([id number]) -------------------------------------------------------------------------------- Activities of Daily Living Details Patient Name: George Hensley. Date of Service: 09/04/2018 2:30 PM Medical Record Number: [id number] Patient Account Number: [id number] Date of Birth/Sex: Feb 16, 1931 (85 y.o. Male) Treating RN: Arnette Norris Primary Care Keshaun Dubey: Bethann Punches Other Clinician: Referring Panhia Karl: Bethann Punches Treating Anastaisa Wooding/Extender: Linwood Dibbles, HOYT Weeks in Treatment: 0 Activities of Daily Living Items Answer Activities of Daily Living (Please select one for each item) Drive Automobile Completely Able Take Medications Completely Able Use Telephone Completely Able Care for Appearance Completely Able Use Toilet Completely Able Bath / Shower Completely Able Dress Self Completely Able Feed Self Completely Able Walk Completely Able Get In / Out Bed Completely Able Housework Completely Able Prepare Meals Completely  Able Handle Money Completely Able Shop for Self Completely Able Electronic Signature(s) Signed: 09/08/2018 3:50:02 PM By: Arnette Norris Entered By: Arnette Norris on 09/04/2018 15:10:37 George Hensley ([id number]) -------------------------------------------------------------------------------- Education Assessment Details Patient Name: George Hensley. Date of Service: 09/04/2018 2:30 PM Medical Record Number: [id number] Patient Account Number: [id number] Date of Birth/Sex: [birth year] (85 y.o. Male) Treating RN: Arnette Norris Primary Care Pedram Goodchild: Bethann Punches Other Clinician: Referring Delainey Winstanley: Bethann Punches Treating Myleah Cavendish/Extender: Linwood Dibbles, HOYT Weeks in Treatment: 0 Primary Learner Assessed: Patient Learning Preferences/Education Level/Primary Language Learning Preference: Explanation Highest Education Level: College or Above Preferred Language: English Cognitive Barrier Assessment/Beliefs Language Barrier: No Translator Needed: No Memory Deficit: No Emotional Barrier: No Cultural/Religious Beliefs Affecting Medical Care: No Physical Barrier Assessment Impaired Vision: No Impaired Hearing: No Decreased Hand dexterity: No Knowledge/Comprehension Assessment Knowledge Level: High Comprehension Level: High Ability to understand written High instructions: Ability to understand verbal High instructions: Motivation Assessment Anxiety Level: Calm Cooperation: Cooperative Education Importance: Acknowledges Need Interest in Health Problems: Asks Questions Perception: Coherent Willingness to Engage in Self- High Management Activities: Readiness to Engage in Self- High Management Activities: Electronic Signature(s) Signed: 09/08/2018 3:50:02 PM By: Arnette Norris Entered By: Arnette Norris on 09/04/2018 15:11:07 George Hensley ([id number]) -------------------------------------------------------------------------------- Fall Risk Assessment  Details Patient Name: George Hensley. Date of Service: 09/04/2018 2:30 PM Medical Record Number: [id number] Patient Account Number: [id number] Date of Birth/Sex: 06-18-1931 (85 y.o. Male) Treating RN: Arnette Norris Primary Care Azahel Belcastro: Bethann Punches Other Clinician: Referring Perlie Scheuring: Bethann Punches Treating Karina Lenderman/Extender: Linwood Dibbles, HOYT Weeks in Treatment: 0 Fall Risk Assessment Items Have you had 2 or more falls in the last 12 monthso 0 No Have you had any fall that resulted in injury in the last 12 monthso 0 No FALL RISK ASSESSMENT: History of falling - immediate or within 3 months 25 Yes Secondary diagnosis 0 No Ambulatory aid None/bed rest/wheelchair/nurse 0 No Crutches/cane/walker 0 No Furniture 0  No IV Access/Saline Lock 0 No Gait/Training Normal/bed rest/immobile 0 No Weak 0 No Impaired 0 No Mental Status Oriented to own ability 0 Yes Electronic Signature(s) Signed: 09/08/2018 3:50:02 PM By: Arnette Norris Entered By: Arnette Norris on 09/04/2018 15:11:33 George Hensley ([id number]) -------------------------------------------------------------------------------- Foot Assessment Details Patient Name: George Hensley. Date of Service: 09/04/2018 2:30 PM Medical Record Number: [id number] Patient Account Number: [id number] Date of Birth/Sex: 18-Jul-1931 (85 y.o. Male) Treating RN: Arnette Norris Primary Care Eilene Voigt: Bethann Punches Other Clinician: Referring Rainen Vanrossum: Bethann Punches Treating Janazia Schreier/Extender: Linwood Dibbles, HOYT Weeks in Treatment: 0 Foot Assessment Items Site Locations + = Sensation present, - = Sensation absent, C = Callus, U = Ulcer R = Redness, W = Warmth, M = Maceration, PU = Pre-ulcerative lesion F = Fissure, S = Swelling, D = Dryness Assessment Right: Left: Other Deformity: No No Prior Foot Ulcer: No No Prior Amputation: No No Charcot Joint: No No Ambulatory Status: Ambulatory Without Help Gait: Unsteady Electronic  Signature(s) Signed: 09/08/2018 3:50:02 PM By: Arnette Norris Entered By: Arnette Norris on 09/04/2018 15:17:58 George Hensley ([id number]) -------------------------------------------------------------------------------- Nutrition Risk Assessment Details Patient Name: George Hensley. Date of Service: 09/04/2018 2:30 PM Medical Record Number: [id number] Patient Account Number: [id number] Date of Birth/Sex: 06-06-1931 (85 y.o. Male) Treating RN: Arnette Norris Primary Care Carmine Youngberg: Bethann Punches Other Clinician: Referring Lanyla Costello: Bethann Punches Treating Brittan Mapel/Extender: Linwood Dibbles, HOYT Weeks in Treatment: 0 Height (in): 70 Weight (lbs): 160 Body Mass Index (BMI): 23 Nutrition Risk Assessment Items NUTRITION RISK SCREEN: I have an illness or condition that made me change the kind and/or amount of 0 No food I eat I eat fewer than two meals per day 0 No I eat few fruits and vegetables, or milk products 0 No I have three or more drinks of beer, liquor or wine almost every day 0 No I have tooth or mouth problems that make it hard for me to eat 0 No I don't always have enough money to buy the food I need 0 No I eat alone most of the time 0 No I take three or more different prescribed or over-the-counter drugs a day 1 Yes Without wanting to, I have lost or gained 10 pounds in the last six months 0 No I am not always physically able to shop, cook and/or feed myself 0 No Nutrition Protocols Good Risk Protocol Moderate Risk Protocol Electronic Signature(s) Signed: 09/08/2018 3:50:02 PM By: Arnette Norris Entered By: Arnette Norris on 09/04/2018 15:11:45

## 2018-09-08 NOTE — Progress Notes (Signed)
[name], [name] ([id number]) Visit Report for 09/04/2018 Chief Complaint Document Details Patient Name: George Hensley, George Hensley. Date of Service: 09/04/2018 2:30 PM Medical Record Number: [id number] Patient Account Number: [id number] Date of Birth/Sex: [birth year] (85 y.o. Male) Treating RN: Huel Coventry Primary Care Provider: Bethann Punches Other Clinician: Referring Provider: Bethann Punches Treating Provider/Extender: Linwood Dibbles, Jkayla Spiewak Weeks in Treatment: 0 Information Obtained from: Patient Chief Complaint Multiple Left UE and Bilateral LE Ulcers Electronic Signature(s) Signed: 09/04/2018 5:02:32 PM By: Lenda Kelp PA-C Entered By: Lenda Kelp on 09/04/2018 15:47:20 George Hensley ([id number]) -------------------------------------------------------------------------------- Debridement Details Patient Name: George Hensley. Date of Service: 09/04/2018 2:30 PM Medical Record Number: [id number] Patient Account Number: [id number] Date of Birth/Sex: [birth year] (85 y.o. Male) Treating RN: Curtis Sites Primary Care Provider: Bethann Punches Other Clinician: Referring Provider: Bethann Punches Treating Provider/Extender: Linwood Dibbles, Maha Fischel Weeks in Treatment: 0 Debridement Performed for Wound #1 Left Elbow Assessment: Performed By: Physician STONE III, Jannely Henthorn E., PA-C Debridement Type: Debridement Level of Consciousness (Pre- Awake and Alert procedure): Pre-procedure Verification/Time Yes - 15:56 Out Taken: Start Time: 15:56 Pain Control: Lidocaine 4% Topical Solution Total Area Debrided (L x W): 3.5 (cm) x 4 (cm) = 14 (cm) Tissue and other material Non-Viable, Skin: Dermis , Skin: Epidermis debrided: Level: Skin/Epidermis Debridement Description: Selective/Open Wound Instrument: Forceps, Scissors Bleeding: Minimum Hemostasis Achieved: Pressure End Time: 15:58 Procedural Pain: 0 Post Procedural Pain: 0 Response to Treatment: Procedure was tolerated well Level of Consciousness Awake and  Alert (Post-procedure): Post Debridement Measurements of Total Wound Length: (cm) 3.5 Width: (cm) 4 Depth: (cm) 0.1 Volume: (cm) 1.1 Character of Wound/Ulcer Post Debridement: Improved Post Procedure Diagnosis Same as Pre-procedure Electronic Signature(s) Signed: 09/04/2018 5:02:32 PM By: Lenda Kelp PA-C Signed: 09/04/2018 5:07:23 PM By: Curtis Sites Entered By: Curtis Sites on 09/04/2018 15:58:25 George Hensley ([id number]) -------------------------------------------------------------------------------- Debridement Details Patient Name: George Hensley. Date of Service: 09/04/2018 2:30 PM Medical Record Number: [id number] Patient Account Number: [id number] Date of Birth/Sex: 1931/05/28 (85 y.o. Male) Treating RN: Curtis Sites Primary Care Provider: Bethann Punches Other Clinician: Referring Provider: Bethann Punches Treating Provider/Extender: Linwood Dibbles, Tasman Zapata Weeks in Treatment: 0 Debridement Performed for Wound #2 Left,Proximal Forearm Assessment: Performed By: Physician STONE III, Elias Bordner E., PA-C Debridement Type: Debridement Level of Consciousness (Pre- Awake and Alert procedure): Pre-procedure Verification/Time Yes - 15:58 Out Taken: Start Time: 15:58 Pain Control: Lidocaine 4% Topical Solution Total Area Debrided (L x W): 2 (cm) x 2.1 (cm) = 4.2 (cm) Tissue and other material Non-Viable, Skin: Dermis , Skin: Epidermis debrided: Level: Skin/Epidermis Debridement Description: Selective/Open Wound Instrument: Forceps, Scissors Bleeding: Minimum Hemostasis Achieved: Pressure End Time: 16:00 Procedural Pain: 0 Post Procedural Pain: 0 Response to Treatment: Procedure was tolerated well Level of Consciousness Awake and Alert (Post-procedure): Post Debridement Measurements of Total Wound Length: (cm) 2 Width: (cm) 2.1 Depth: (cm) 0.1 Volume: (cm) 0.33 Character of Wound/Ulcer Post Debridement: Improved Post Procedure Diagnosis Same as  Pre-procedure Electronic Signature(s) Signed: 09/04/2018 5:02:32 PM By: Lenda Kelp PA-C Signed: 09/04/2018 5:07:23 PM By: Curtis Sites Entered By: Curtis Sites on 09/04/2018 15:59:03 George Hensley ([id number]) -------------------------------------------------------------------------------- Debridement Details Patient Name: George Hensley. Date of Service: 09/04/2018 2:30 PM Medical Record Number: [id number] Patient Account Number: [id number] Date of Birth/Sex: August 15, 1931 (85 y.o. Male) Treating RN: Curtis Sites Primary Care Provider: Bethann Punches Other Clinician: Referring Provider: Bethann Punches Treating Provider/Extender: Linwood Dibbles, Garret Teale Weeks in Treatment: 0 Debridement Performed for Wound #3 Left,Distal Forearm Assessment: Performed By: Physician  STONE III, Skylee Baird E., PA-C Debridement Type: Debridement Level of Consciousness (Pre- Awake and Alert procedure): Pre-procedure Verification/Time Yes - 16:00 Out Taken: Start Time: 16:00 Pain Control: Lidocaine 4% Topical Solution Total Area Debrided (L x W): 3.5 (cm) x 3 (cm) = 10.5 (cm) Tissue and other material Non-Viable, Skin: Dermis , Skin: Epidermis debrided: Level: Skin/Epidermis Debridement Description: Selective/Open Wound Instrument: Forceps, Scissors Bleeding: Minimum Hemostasis Achieved: Pressure End Time: 16:02 Procedural Pain: 0 Post Procedural Pain: 0 Response to Treatment: Procedure was tolerated well Level of Consciousness Awake and Alert (Post-procedure): Post Debridement Measurements of Total Wound Length: (cm) 3.5 Width: (cm) 3 Depth: (cm) 0.1 Volume: (cm) 0.825 Character of Wound/Ulcer Post Debridement: Improved Post Procedure Diagnosis Same as Pre-procedure Electronic Signature(s) Signed: 09/04/2018 5:02:32 PM By: Lenda Kelp PA-C Signed: 09/04/2018 5:07:23 PM By: Curtis Sites Entered By: Curtis Sites on 09/04/2018 16:01:31 George Hensley  ([id number]) -------------------------------------------------------------------------------- Debridement Details Patient Name: George Hensley. Date of Service: 09/04/2018 2:30 PM Medical Record Number: [id number] Patient Account Number: [id number] Date of Birth/Sex: 03-27-1931 (85 y.o. Male) Treating RN: Curtis Sites Primary Care Provider: Bethann Punches Other Clinician: Referring Provider: Bethann Punches Treating Provider/Extender: Linwood Dibbles, Dawana Asper Weeks in Treatment: 0 Debridement Performed for Wound #4 Left Wrist Assessment: Performed By: Physician STONE III, Ira Busbin E., PA-C Debridement Type: Debridement Level of Consciousness (Pre- Awake and Alert procedure): Pre-procedure Verification/Time Yes - 16:02 Out Taken: Start Time: 16:02 Pain Control: Lidocaine 4% Topical Solution Total Area Debrided (L x W): 1.6 (cm) x 1.2 (cm) = 1.92 (cm) Tissue and other material Non-Viable, Skin: Dermis , Skin: Epidermis debrided: Level: Skin/Epidermis Debridement Description: Selective/Open Wound Instrument: Forceps, Scissors Bleeding: Minimum Hemostasis Achieved: Pressure End Time: 16:04 Procedural Pain: 0 Post Procedural Pain: 0 Response to Treatment: Procedure was tolerated well Level of Consciousness Awake and Alert (Post-procedure): Post Debridement Measurements of Total Wound Length: (cm) 1.6 Width: (cm) 1.2 Depth: (cm) 0.1 Volume: (cm) 0.151 Character of Wound/Ulcer Post Debridement: Improved Post Procedure Diagnosis Same as Pre-procedure Electronic Signature(s) Signed: 09/04/2018 5:02:32 PM By: Lenda Kelp PA-C Signed: 09/04/2018 5:07:23 PM By: Curtis Sites Entered By: Curtis Sites on 09/04/2018 16:05:25 George Hensley ([id number]) -------------------------------------------------------------------------------- Debridement Details Patient Name: George Hensley. Date of Service: 09/04/2018 2:30 PM Medical Record Number: [id number] Patient Account Number:  [id number] Date of Birth/Sex: 07-05-1931 (85 y.o. Male) Treating RN: Curtis Sites Primary Care Provider: Bethann Punches Other Clinician: Referring Provider: Bethann Punches Treating Provider/Extender: Linwood Dibbles, Carleton Vanval[place]burgh Weeks in Treatment: 0 Debridement Performed for Wound #6 Left,Lateral Knee Assessment: Performed By: Physician STONE III, Allien Melberg E., PA-C Debridement Type: Debridement Level of Consciousness (Pre- Awake and Alert procedure): Pre-procedure Verification/Time Yes - 16:04 Out Taken: Start Time: 16:04 Pain Control: Lidocaine 4% Topical Solution Total Area Debrided (L x W): 2 (cm) x 1 (cm) = 2 (cm) Tissue and other material Non-Viable, Skin: Dermis , Skin: Epidermis debrided: Level: Skin/Epidermis Debridement Description: Selective/Open Wound Instrument: Forceps, Scissors Bleeding: Minimum Hemostasis Achieved: Pressure End Time: 16:06 Procedural Pain: 0 Post Procedural Pain: 0 Response to Treatment: Procedure was tolerated well Level of Consciousness Awake and Alert (Post-procedure): Post Debridement Measurements of Total Wound Length: (cm) 2 Width: (cm) 1 Depth: (cm) 0.1 Volume: (cm) 0.157 Character of Wound/Ulcer Post Debridement: Improved Post Procedure Diagnosis Same as Pre-procedure Electronic Signature(s) Signed: 09/04/2018 5:02:32 PM By: Lenda Kelp PA-C Signed: 09/04/2018 5:07:23 PM By: Curtis Sites Entered By: Curtis Sites on 09/04/2018 16:10:16 George Hensley ([id number]) -------------------------------------------------------------------------------- Debridement Details Patient Name: George Hensley.  Date of Service: 09/04/2018 2:30 PM Medical Record Number: [id number] Patient Account Number: [id number] Date of Birth/Sex: 1930-09-06 58(85 y.o. Male) Treating RN: Curtis Sitesorthy, Joanna Primary Care Provider: Bethann PunchesMiller, Mark Other Clinician: Referring Provider: Bethann PunchesMiller, Mark Treating Provider/Extender: Linwood DibblesSTONE III, Onnie Hatchel Weeks in Treatment: 0 Debridement  Performed for Wound #8 Right Knee Assessment: Performed By: Physician STONE III, Carneshia Raker E., PA-C Debridement Type: Debridement Level of Consciousness (Pre- Awake and Alert procedure): Pre-procedure Verification/Time Yes - 16:06 Out Taken: Start Time: 16:06 Pain Control: Lidocaine 4% Topical Solution Total Area Debrided (L x W): 1.2 (cm) x 1.2 (cm) = 1.44 (cm) Tissue and other material Non-Viable, Skin: Dermis , Skin: Epidermis debrided: Level: Skin/Epidermis Debridement Description: Selective/Open Wound Instrument: Forceps, Scissors Bleeding: Minimum Hemostasis Achieved: Pressure End Time: 16:08 Procedural Pain: 0 Post Procedural Pain: 0 Response to Treatment: Procedure was tolerated well Level of Consciousness Awake and Alert (Post-procedure): Post Debridement Measurements of Total Wound Length: (cm) 1.2 Width: (cm) 1.2 Depth: (cm) 0.1 Volume: (cm) 0.113 Character of Wound/Ulcer Post Debridement: Improved Post Procedure Diagnosis Same as Pre-procedure Electronic Signature(s) Signed: 09/04/2018 5:02:32 PM By: Lenda KelpStone III, Yoseph Haile PA-C Signed: 09/04/2018 5:07:23 PM By: Curtis Sitesorthy, Joanna Entered By: Curtis Sitesorthy, Joanna on 09/04/2018 16:11:21 George ParisJOHNSON, George F. ([id number]) -------------------------------------------------------------------------------- HPI Details Patient Name: George ParisJOHNSON, George F. Date of Service: 09/04/2018 2:30 PM Medical Record Number: [id number] Patient Account Number: [id number] Date of Birth/Sex: 1930-09-06 79(85 y.o. Male) Treating RN: Huel CoventryWoody, Kim Primary Care Provider: Bethann PunchesMiller, Mark Other Clinician: Referring Provider: Bethann PunchesMiller, Mark Treating Provider/Extender: Linwood DibblesSTONE III, Thermon Zulauf Weeks in Treatment: 0 History of Present Illness HPI Description: 09/04/18 on evaluation today patient actually appears to be doing somewhat poorly upon initial evaluation in regard to his left upper extremity in his bilateral knees. He unfortunately sustained a fall while [place] was at the beach  this past Saturday 08/30/18. Nonetheless he was seen in the emergency department initially and then subsequently was seen by his primary care provider Dr. Hyacinth MeekerMiller who referred him to us for further evaluation and treatment. Subsequently he was given a tetanus shot during the course of all this and also was placed on Flex 250 mg three times a day for 10 days by Dr. Hyacinth MeekerMiller. Currently the patient has several wounds in varying stages of stability over the left upper extremity and the bilateral knee regions. He does have a history of diabetes mellitus type II and hypertension. Electronic Signature(s) Signed: 09/04/2018 5:02:32 PM By: Lenda KelpStone III, Martie Fulgham PA-C Entered By: Lenda KelpStone III, Sweetie Giebler on 09/04/2018 16:59:33 George ParisJOHNSON, George F. ([id number]) -------------------------------------------------------------------------------- Physical Exam Details Patient Name: George ParisJOHNSON, George F. Date of Service: 09/04/2018 2:30 PM Medical Record Number: [id number] Patient Account Number: [id number] Date of Birth/Sex: 1930-09-06 45(85 y.o. Male) Treating RN: Huel CoventryWoody, Kim Primary Care Provider: Bethann PunchesMiller, Mark Other Clinician: Referring Provider: Bethann PunchesMiller, Mark Treating Provider/Extender: STONE III, Dianah Pruett Weeks in Treatment: 0 Constitutional sitting or standing blood pressure is within target range for patient.. pulse regular and within target range for patient.Marland Kitchen. respirations regular, non-labored and within target range for patient.Marland Kitchen. temperature within target range for patient.. Well- nourished and well-hydrated in no acute distress. Eyes conjunctiva clear no eyelid edema noted. pupils equal round and reactive to light and accommodation. Ears, Nose, Mouth, and Throat no gross abnormality of ear auricles or external auditory canals. normal hearing noted during conversation. mucus membranes moist. Respiratory normal breathing without difficulty. clear to auscultation bilaterally. Cardiovascular regular rate and rhythm with normal S1, S2.  no clubbing, cyanosis, significant edema, <3 sec cap refill. Gastrointestinal (GI) soft, non-tender, non-distended, +BS.  no ventral hernia noted. Musculoskeletal normal gait and posture. no significant deformity or arthritic changes, no loss or range of motion, no clubbing. Psychiatric this patient is able to make decisions and demonstrates good insight into disease process. Alert and Oriented x 3. pleasant and cooperative. Notes Upon inspection today patient's wounds did require debridement at most locations to clear away dead tissue which had been torn back. This was mainly epidermis comprising minimal amounts of dermis noted currently. Fortunately he was able to tolerate this without any significant discomfort which is great news. No fevers, chills, nausea, or vomiting noted at this time. Post debridement all locations that were debrided appear to be doing much better. Electronic Signature(s) Signed: 09/04/2018 5:02:32 PM By: Lenda Kelp PA-C Entered By: Lenda Kelp on 09/04/2018 17:00:32 George Hensley ([id number]) -------------------------------------------------------------------------------- Physician Orders Details Patient Name: George Hensley, George Hensley. Date of Service: 09/04/2018 2:30 PM Medical Record Number: [id number] Patient Account Number: [id number] Date of Birth/Sex: [birth year] (85 y.o. Male) Treating RN: Curtis Sites Primary Care Provider: Bethann Punches Other Clinician: Referring Provider: Bethann Punches Treating Provider/Extender: Linwood Dibbles, Oryan Winterton Weeks in Treatment: 0 Verbal / Phone Orders: No Diagnosis Coding ICD-10 Coding Code Description S51.002A Unspecified open wound of left elbow, initial encounter S51.802A Unspecified open wound of left forearm, initial encounter S61.502A Unspecified open wound of left wrist, initial encounter S61.402A Unspecified open wound of left hand, initial encounter S81.002A Unspecified open wound, left knee, initial  encounter S81.001A Unspecified open wound, right knee, initial encounter I10 Essential (primary) hypertension E11.622 Type 2 diabetes mellitus with other skin ulcer Wound Cleansing Wound #1 Left Elbow o Clean wound with Normal Saline. o May Shower, gently pat wound dry prior to applying new dressing. - Please use Dial antibacterial soap to wash your wounds Wound #2 Left,Proximal Forearm o Clean wound with Normal Saline. o May Shower, gently pat wound dry prior to applying new dressing. - Please use Dial antibacterial soap to wash your wounds Wound #3 Left,Distal Forearm o Clean wound with Normal Saline. o May Shower, gently pat wound dry prior to applying new dressing. - Please use Dial antibacterial soap to wash your wounds Wound #4 Left Wrist o Clean wound with Normal Saline. o May Shower, gently pat wound dry prior to applying new dressing. - Please use Dial antibacterial soap to wash your wounds Wound #5 Left Hand - 5th Digit o Clean wound with Normal Saline. o May Shower, gently pat wound dry prior to applying new dressing. - Please use Dial antibacterial soap to wash your wounds Wound #6 Left,Lateral Knee o Clean wound with Normal Saline. o May Shower, gently pat wound dry prior to applying new dressing. - Please use Dial antibacterial soap to wash your wounds Wound #7 Left Knee BRAYDAN, MARRIOTT ([id number]) o Clean wound with Normal Saline. o May Shower, gently pat wound dry prior to applying new dressing. - Please use Dial antibacterial soap to wash your wounds Wound #8 Right Knee o Clean wound with Normal Saline. o May Shower, gently pat wound dry prior to applying new dressing. - Please use Dial antibacterial soap to wash your wounds Anesthetic (add to Medication List) Wound #1 Left Elbow o Topical Lidocaine 4% cream applied to wound bed prior to debridement (In Clinic Only). Wound #2 Left,Proximal Forearm o Topical Lidocaine 4%  cream applied to wound bed prior to debridement (In Clinic Only). Wound #3 Left,Distal Forearm o Topical Lidocaine 4% cream applied to wound bed prior to debridement (In Clinic Only). Wound #  4 Left Wrist o Topical Lidocaine 4% cream applied to wound bed prior to debridement (In Clinic Only). Wound #5 Left Hand - 5th Digit o Topical Lidocaine 4% cream applied to wound bed prior to debridement (In Clinic Only). Wound #6 Left,Lateral Knee o Topical Lidocaine 4% cream applied to wound bed prior to debridement (In Clinic Only). Wound #7 Left Knee o Topical Lidocaine 4% cream applied to wound bed prior to debridement (In Clinic Only). Wound #8 Right Knee o Topical Lidocaine 4% cream applied to wound bed prior to debridement (In Clinic Only). Primary Wound Dressing Wound #1 Left Elbow o Xeroform Wound #2 Left,Proximal Forearm o Xeroform Wound #3 Left,Distal Forearm o Xeroform Wound #4 Left Wrist o Xeroform Wound #5 Left Hand - 5th Digit o Xeroform Wound #6 Left,Lateral Knee o Xeroform Wound #7 Left Knee o Xeroform Wound #8 Right Knee George Hensley, George Hensley ([id number]) o Xeroform Secondary Dressing Wound #1 Left Elbow o Conform/Kerlix o Non-adherent pad Wound #2 Left,Proximal Forearm o Conform/Kerlix o Non-adherent pad Wound #3 Left,Distal Forearm o Conform/Kerlix o Non-adherent pad Wound #4 Left Wrist o Other - coverlet or bandaid Wound #5 Left Hand - 5th Digit o Other - coverlet or bandaid Wound #6 Left,Lateral Knee o Boardered Foam Dressing Wound #7 Left Knee o Boardered Foam Dressing Wound #8 Right Knee o Boardered Foam Dressing Dressing Change Frequency Wound #1 Left Elbow o Change dressing every day. Wound #2 Left,Proximal Forearm o Change dressing every day. Wound #3 Left,Distal Forearm o Change dressing every day. Wound #4 Left Wrist o Change dressing every day. Wound #5 Left Hand - 5th Digit o Change  dressing every day. Wound #6 Left,Lateral Knee o Change dressing every day. Wound #7 Left Knee o Change dressing every day. Wound #8 Right Knee o Change dressing every day. Follow-up Appointments George Hensley, DADY ([id number]) Wound #1 Left Elbow o Return Appointment in 1 week. Wound #2 Left,Proximal Forearm o Return Appointment in 1 week. Wound #3 Left,Distal Forearm o Return Appointment in 1 week. Wound #4 Left Wrist o Return Appointment in 1 week. Wound #5 Left Hand - 5th Digit o Return Appointment in 1 week. Wound #6 Left,Lateral Knee o Return Appointment in 1 week. Wound #7 Left Knee o Return Appointment in 1 week. Wound #8 Right Knee o Return Appointment in 1 week. Electronic Signature(s) Signed: 09/04/2018 5:02:32 PM By: Lenda Kelp PA-C Signed: 09/04/2018 5:07:23 PM By: Curtis Sites Entered By: Curtis Sites on 09/04/2018 16:07:30 George Hensley ([id number]) -------------------------------------------------------------------------------- Problem List Details Patient Name: George Hensley, George Hensley. Date of Service: 09/04/2018 2:30 PM Medical Record Number: [id number] Patient Account Number: [id number] Date of Birth/Sex: 1931-04-23 (85 y.o. Male) Treating RN: Huel Coventry Primary Care Provider: Bethann Punches Other Clinician: Referring Provider: Bethann Punches Treating Provider/Extender: Linwood Dibbles, Keisha Amer Weeks in Treatment: 0 Active Problems ICD-10 Evaluated Encounter Code Description Active Date Today Diagnosis S51.002A Unspecified open wound of left elbow, initial encounter 09/04/2018 No Yes S51.802A Unspecified open wound of left forearm, initial encounter 09/04/2018 No Yes S61.502A Unspecified open wound of left wrist, initial encounter 09/04/2018 No Yes S61.402A Unspecified open wound of left hand, initial encounter 09/04/2018 No Yes S81.002A Unspecified open wound, left knee, initial encounter 09/04/2018 No Yes S81.001A Unspecified open wound, right knee,  initial encounter 09/04/2018 No Yes I10 Essential (primary) hypertension 09/04/2018 No Yes E11.622 Type 2 diabetes mellitus with other skin ulcer 09/04/2018 No Yes Inactive Problems Resolved Problems Electronic Signature(s) Signed: 09/04/2018 5:02:32 PM By: Lenda Kelp PA-C Entered By:  Lenda Kelp on 09/04/2018 15:46:03 QUAVION, BOULE ([id number]) GRYPHON, VANDERVEEN ([id number]) -------------------------------------------------------------------------------- Progress Note Details Patient Name: George Hensley, George Hensley. Date of Service: 09/04/2018 2:30 PM Medical Record Number: [id number] Patient Account Number: [id number] Date of Birth/Sex: [birth year] (85 y.o. Male) Treating RN: Huel Coventry Primary Care Provider: Bethann Punches Other Clinician: Referring Provider: Bethann Punches Treating Provider/Extender: Linwood Dibbles, Samyak Sackmann Weeks in Treatment: 0 Subjective Chief Complaint Information obtained from Patient Multiple Left UE and Bilateral LE Ulcers History of Present Illness (HPI) 09/04/18 on evaluation today patient actually appears to be doing somewhat poorly upon initial evaluation in regard to his left upper extremity in his bilateral knees. He unfortunately sustained a fall while he was at the beach this past Saturday 08/30/18. Nonetheless he was seen in the emergency department initially and then subsequently was seen by his primary care provider Dr. Hyacinth Meeker who referred him to [place] for further evaluation and treatment. Subsequently he was given a tetanus shot during the course of all this and also was placed on Flex 250 mg three times a day for 10 days by Dr. Hyacinth Meeker. Currently the patient has several wounds in varying stages of stability over the left upper extremity and the bilateral knee regions. He does have a history of diabetes mellitus type II and hypertension. Wound History Patient presents with 6 open wounds that have been present for approximately 1 week. Patient has been treating wounds  in the following manner: non adherant dressing, compression. Laboratory tests have not been performed in the last month. Patient reportedly has not tested positive for an antibiotic resistant organism. Patient reportedly has not tested positive for osteomyelitis. Patient reportedly has not had testing performed to evaluate circulation in the legs. Patient History Information obtained from Patient. Allergies ramipril (Severity: Moderate), Toprol XL Social History Former smoker - cigar, Marital Status - Married, Alcohol Use - Never, Drug Use - No History, Caffeine Use - Daily. Medical History Respiratory Patient has history of Asthma Cardiovascular Patient has history of Hypertension Endocrine Patient has history of Type II Diabetes - 20 years oral meds Integumentary (Skin) Denies history of History of Burn, History of pressure wounds Patient is treated with Oral Agents. Blood sugar is not tested. Medical And Surgical History Notes Eyes macular degeneration Cardiovascular George Hensley, George Hensley ([id number]) mitral valve disorder 2000 stent placed Neurologic Stroke Review of Systems (ROS) Ear/Nose/Mouth/Throat The patient has no complaints or symptoms. Cardiovascular The patient has no complaints or symptoms. Gastrointestinal The patient has no complaints or symptoms. Endocrine The patient has no complaints or symptoms. Genitourinary The patient has no complaints or symptoms. Immunological The patient has no complaints or symptoms. Integumentary (Skin) Complains or has symptoms of Wounds. Musculoskeletal The patient has no complaints or symptoms. Neurologic The patient has no complaints or symptoms. Oncologic The patient has no complaints or symptoms. Objective Constitutional sitting or standing blood pressure is within target range for patient.. pulse regular and within target range for patient.Marland Kitchen respirations regular, non-labored and within target range for patient.Marland Kitchen  temperature within target range for patient.. Well- nourished and well-hydrated in no acute distress. Vitals Time Taken: 2:55 PM, Height: 70 in, Source: Stated, Weight: 160 lbs, Source: Stated, BMI: 23, Temperature: 98.1 F, Pulse: 91 bpm, Respiratory Rate: 18 breaths/min, Blood Pressure: 110/63 mmHg. Eyes conjunctiva clear no eyelid edema noted. pupils equal round and reactive to light and accommodation. Ears, Nose, Mouth, and Throat no gross abnormality of ear auricles or external auditory canals. normal hearing noted during conversation. mucus membranes moist.  Respiratory normal breathing without difficulty. clear to auscultation bilaterally. Cardiovascular regular rate and rhythm with normal S1, S2. no clubbing, cyanosis, significant edema, Gastrointestinal (GI) soft, non-tender, non-distended, +BS. no ventral hernia noted. Marinell BlightJOHNSON, Takuya F. ([id number]) Musculoskeletal normal gait and posture. no significant deformity or arthritic changes, no loss or range of motion, no clubbing. Psychiatric this patient is able to make decisions and demonstrates good insight into disease process. Alert and Oriented x 3. pleasant and cooperative. General Notes: Upon inspection today patient's wounds did require debridement at most locations to clear away dead tissue which had been torn back. This was mainly epidermis comprising minimal amounts of dermis noted currently. Fortunately he was able to tolerate this without any significant discomfort which is great news. No fevers, chills, nausea, or vomiting noted at this time. Post debridement all locations that were debrided appear to be doing much better. Integumentary (Hair, Skin) Wound #1 status is Open. Original cause of wound was Trauma. The wound is located on the Left Elbow. The wound measures 3.5cm length x 4cm width x 0.1cm depth; 10.996cm^2 area and 1.1cm^3 volume. There is Fat Layer (Subcutaneous Tissue) Exposed exposed. There is no tunneling  or undermining noted. There is a large amount of serosanguineous drainage noted. There is small (1-33%) pale granulation within the wound bed. There is a large (67-100%) amount of necrotic tissue within the wound bed including Eschar and Adherent Slough. The periwound skin appearance exhibited: Hemosiderin Staining. Periwound temperature was noted as No Abnormality. The periwound has tenderness on palpation. Wound #2 status is Open. Original cause of wound was Trauma. The wound is located on the Left,Proximal Forearm. The wound measures 2cm length x 2.1cm width x 0.1cm depth; 3.299cm^2 area and 0.33cm^3 volume. There is Fat Layer (Subcutaneous Tissue) Exposed exposed. There is no tunneling or undermining noted. There is a large amount of serosanguineous drainage noted. The wound margin is flat and intact. There is small (1-33%) pink granulation within the wound bed. There is a medium (34-66%) amount of necrotic tissue within the wound bed including Eschar. The periwound skin appearance exhibited: Hemosiderin Staining. The periwound skin appearance did not exhibit: Callus, Crepitus, Excoriation, Induration, Rash, Scarring, Dry/Scaly, Maceration, Atrophie Blanche, Cyanosis, Ecchymosis, Mottled, Pallor, Rubor, Erythema. Periwound temperature was noted as No Abnormality. The periwound has tenderness on palpation. Wound #3 status is Open. Original cause of wound was Trauma. The wound is located on the Left,Distal Forearm. The wound measures 3.5cm length x 3cm width x 0.1cm depth; 8.247cm^2 area and 0.825cm^3 volume. There is Fat Layer (Subcutaneous Tissue) Exposed exposed. There is no tunneling or undermining noted. There is a large amount of serosanguineous drainage noted. The wound margin is flat and intact. There is small (1-33%) pink granulation within the wound bed. There is a large (67-100%) amount of necrotic tissue within the wound bed including Eschar and Adherent Slough. The periwound skin  appearance exhibited: Hemosiderin Staining. Periwound temperature was noted as No Abnormality. The periwound has tenderness on palpation. Wound #4 status is Open. Original cause of wound was Trauma. The wound is located on the Left Wrist. The wound measures 1.6cm length x 1.2cm width x 0.1cm depth; 1.508cm^2 area and 0.151cm^3 volume. There is Fat Layer (Subcutaneous Tissue) Exposed exposed. There is no tunneling or undermining noted. There is a medium amount of serosanguineous drainage noted. The wound margin is flat and intact. There is small (1-33%) pale granulation within the wound bed. There is a large (67-100%) amount of necrotic tissue within the wound bed  including Eschar and Adherent Slough. The periwound skin appearance exhibited: Hemosiderin Staining. The periwound skin appearance did not exhibit: Callus, Crepitus, Excoriation, Induration, Rash, Scarring, Dry/Scaly, Maceration, Atrophie Blanche, Cyanosis, Ecchymosis, Mottled, Pallor, Rubor, Erythema. Periwound temperature was noted as No Abnormality. The periwound has tenderness on palpation. Wound #5 status is Open. Original cause of wound was Trauma. The wound is located on the Left Hand - 5th Digit. The wound measures 0.1cm length x 1cm width x 0.1cm depth; 0.079cm^2 area and 0.008cm^3 volume. There is Fat Layer (Subcutaneous Tissue) Exposed exposed. There is no tunneling or undermining noted. There is a medium amount of serosanguineous drainage noted. The wound margin is flat and intact. There is small (1-33%) pink granulation within the wound bed. There is a large (67-100%) amount of necrotic tissue within the wound bed including Eschar and Adherent Slough. The periwound skin appearance exhibited: Hemosiderin Staining. The periwound skin appearance did not exhibit: Callus, Crepitus, Excoriation, Induration, Rash, Scarring, Dry/Scaly, Maceration, Atrophie Blanche, Cyanosis, Ecchymosis, Mottled, Pallor, Rubor, Erythema. Periwound  temperature was noted as No Abnormality. The periwound has tenderness on palpation. George Hensley, George Hensley ([id number]) Wound #6 status is Open. Original cause of wound was Trauma. The wound is located on the Left,Lateral Knee. The wound measures 2cm length x 1cm width x 0.1cm depth; 1.571cm^2 area and 0.157cm^3 volume. There is Fat Layer (Subcutaneous Tissue) Exposed exposed. There is no tunneling or undermining noted. There is a large amount of serosanguineous drainage noted. The wound margin is flat and intact. There is small (1-33%) pink granulation within the wound bed. There is a large (67-100%) amount of necrotic tissue within the wound bed including Eschar and Adherent Slough. The periwound skin appearance exhibited: Hemosiderin Staining. The periwound skin appearance did not exhibit: Callus, Crepitus, Excoriation, Induration, Rash, Scarring, Dry/Scaly, Maceration, Atrophie Blanche, Cyanosis, Ecchymosis, Mottled, Pallor, Rubor, Erythema. Periwound temperature was noted as No Abnormality. The periwound has tenderness on palpation. Wound #7 status is Open. Original cause of wound was Trauma. The wound is located on the Left Knee. The wound measures 3cm length x 3cm width x 0.1cm depth; 7.069cm^2 area and 0.707cm^3 volume. There is Fat Layer (Subcutaneous Tissue) Exposed exposed. There is no tunneling or undermining noted. There is a large amount of serosanguineous drainage noted. The wound margin is flat and intact. There is a medium (34-66%) amount of necrotic tissue within the wound bed including Adherent Slough. The periwound skin appearance exhibited: Erythema. The periwound skin appearance did not exhibit: Callus, Crepitus, Excoriation, Induration, Rash, Scarring, Dry/Scaly, Maceration, Atrophie Blanche, Cyanosis, Ecchymosis, Hemosiderin Staining, Mottled, Pallor, Rubor. The surrounding wound skin color is noted with erythema which is circumferential. Periwound temperature was noted as No  Abnormality. The periwound has tenderness on palpation. Wound #8 status is Open. Original cause of wound was Trauma. The wound is located on the Right Knee. The wound measures 1.2cm length x 1.2cm width x 0.1cm depth; 1.131cm^2 area and 0.113cm^3 volume. There is Fat Layer (Subcutaneous Tissue) Exposed exposed. There is no tunneling or undermining noted. There is a medium amount of serosanguineous drainage noted. The wound margin is flat and intact. There is small (1-33%) pink granulation within the wound bed. There is a large (67-100%) amount of necrotic tissue within the wound bed including Eschar and Adherent Slough. The periwound skin appearance did not exhibit: Callus, Crepitus, Excoriation, Induration, Rash, Scarring, Dry/Scaly, Maceration, Atrophie Blanche, Cyanosis, Ecchymosis, Hemosiderin Staining, Mottled, Pallor, Rubor, Erythema. Periwound temperature was noted as No Abnormality. The periwound has tenderness on palpation.  Assessment Active Problems ICD-10 Unspecified open wound of left elbow, initial encounter Unspecified open wound of left forearm, initial encounter Unspecified open wound of left wrist, initial encounter Unspecified open wound of left hand, initial encounter Unspecified open wound, left knee, initial encounter Unspecified open wound, right knee, initial encounter Essential (primary) hypertension Type 2 diabetes mellitus with other skin ulcer Procedures Wound #1 Pre-procedure diagnosis of Wound #1 is a Trauma, Other located on the Left Elbow . There was a Selective/Open Wound Skin/Epidermis Debridement with a total area of 14 sq cm performed by STONE III, Dung Prien E., PA-C. With the following instrument(s): Forceps, and Scissors to remove Non-Viable tissue/material. Material removed includes Skin: Dermis and Skin: Epidermis and after achieving pain control using Lidocaine 4% Topical Solution. No specimens were taken. A time out was conducted at 15:56, prior to the  start of the procedure. A Minimum amount of bleeding was controlled with Pressure. The procedure was tolerated well with a pain level of 0 throughout and a pain level of 0 following the procedure. Post Debridement AYVIN, LIPINSKI ([id number]) Measurements: 3.5cm length x 4cm width x 0.1cm depth; 1.1cm^3 volume. Character of Wound/Ulcer Post Debridement is improved. Post procedure Diagnosis Wound #1: Same as Pre-Procedure Wound #2 Pre-procedure diagnosis of Wound #2 is a Trauma, Other located on the Left,Proximal Forearm . There was a Selective/Open Wound Skin/Epidermis Debridement with a total area of 4.2 sq cm performed by STONE III, Eufemio Strahm E., PA-C. With the following instrument(s): Forceps, and Scissors to remove Non-Viable tissue/material. Material removed includes Skin: Dermis and Skin: Epidermis and after achieving pain control using Lidocaine 4% Topical Solution. No specimens were taken. A time out was conducted at 15:58, prior to the start of the procedure. A Minimum amount of bleeding was controlled with Pressure. The procedure was tolerated well with a pain level of 0 throughout and a pain level of 0 following the procedure. Post Debridement Measurements: 2cm length x 2.1cm width x 0.1cm depth; 0.33cm^3 volume. Character of Wound/Ulcer Post Debridement is improved. Post procedure Diagnosis Wound #2: Same as Pre-Procedure Wound #3 Pre-procedure diagnosis of Wound #3 is a Trauma, Other located on the Left,Distal Forearm . There was a Selective/Open Wound Skin/Epidermis Debridement with a total area of 10.5 sq cm performed by STONE III, Steffanie Mingle E., PA-C. With the following instrument(s): Forceps, and Scissors to remove Non-Viable tissue/material. Material removed includes Skin: Dermis and Skin: Epidermis and after achieving pain control using Lidocaine 4% Topical Solution. No specimens were taken. A time out was conducted at 16:00, prior to the start of the procedure. A Minimum amount of  bleeding was controlled with Pressure. The procedure was tolerated well with a pain level of 0 throughout and a pain level of 0 following the procedure. Post Debridement Measurements: 3.5cm length x 3cm width x 0.1cm depth; 0.825cm^3 volume. Character of Wound/Ulcer Post Debridement is improved. Post procedure Diagnosis Wound #3: Same as Pre-Procedure Wound #4 Pre-procedure diagnosis of Wound #4 is a Trauma, Other located on the Left Wrist . There was a Selective/Open Wound Skin/Epidermis Debridement with a total area of 1.92 sq cm performed by STONE III, Luvia Orzechowski E., PA-C. With the following instrument(s): Forceps, and Scissors to remove Non-Viable tissue/material. Material removed includes Skin: Dermis and Skin: Epidermis and after achieving pain control using Lidocaine 4% Topical Solution. No specimens were taken. A time out was conducted at 16:02, prior to the start of the procedure. A Minimum amount of bleeding was controlled with Pressure. The procedure was tolerated well  with a pain level of 0 throughout and a pain level of 0 following the procedure. Post Debridement Measurements: 1.6cm length x 1.2cm width x 0.1cm depth; 0.151cm^3 volume. Character of Wound/Ulcer Post Debridement is improved. Post procedure Diagnosis Wound #4: Same as Pre-Procedure Wound #6 Pre-procedure diagnosis of Wound #6 is a Trauma, Other located on the Left,Lateral Knee . There was a Selective/Open Wound Skin/Epidermis Debridement with a total area of 2 sq cm performed by STONE III, Dariann Huckaba E., PA-C. With the following instrument(s): Forceps, and Scissors to remove Non-Viable tissue/material. Material removed includes Skin: Dermis and Skin: Epidermis and after achieving pain control using Lidocaine 4% Topical Solution. No specimens were taken. A time out was conducted at 16:04, prior to the start of the procedure. A Minimum amount of bleeding was controlled with Pressure. The procedure was tolerated well with a pain  level of 0 throughout and a pain level of 0 following the procedure. Post Debridement Measurements: 2cm length x 1cm width x 0.1cm depth; 0.157cm^3 volume. Character of Wound/Ulcer Post Debridement is improved. Post procedure Diagnosis Wound #6: Same as Pre-Procedure Wound #8 Pre-procedure diagnosis of Wound #8 is a Trauma, Other located on the Right Knee . There was a Selective/Open Wound Skin/Epidermis Debridement with a total area of 1.44 sq cm performed by STONE III, Megean Fabio E., PA-C. With the following instrument(s): Forceps, and Scissors to remove Non-Viable tissue/material. Material removed includes Skin: Dermis and Skin: Epidermis and after achieving pain control using Lidocaine 4% Topical Solution. No specimens were taken. A time out was conducted at 16:06, prior to the start of the procedure. A Minimum amount of bleeding was controlled with Pressure. The procedure was tolerated well with a pain level of 0 throughout and a pain level of 0 following the procedure. Post Debridement SAFWAN, SIROKY ([id number]) Measurements: 1.2cm length x 1.2cm width x 0.1cm depth; 0.113cm^3 volume. Character of Wound/Ulcer Post Debridement is improved. Post procedure Diagnosis Wound #8: Same as Pre-Procedure Plan Wound Cleansing: Wound #1 Left Elbow: Clean wound with Normal Saline. May Shower, gently pat wound dry prior to applying new dressing. - Please use Dial antibacterial soap to wash your wounds Wound #2 Left,Proximal Forearm: Clean wound with Normal Saline. May Shower, gently pat wound dry prior to applying new dressing. - Please use Dial antibacterial soap to wash your wounds Wound #3 Left,Distal Forearm: Clean wound with Normal Saline. May Shower, gently pat wound dry prior to applying new dressing. - Please use Dial antibacterial soap to wash your wounds Wound #4 Left Wrist: Clean wound with Normal Saline. May Shower, gently pat wound dry prior to applying new dressing. - Please use Dial  antibacterial soap to wash your wounds Wound #5 Left Hand - 5th Digit: Clean wound with Normal Saline. May Shower, gently pat wound dry prior to applying new dressing. - Please use Dial antibacterial soap to wash your wounds Wound #6 Left,Lateral Knee: Clean wound with Normal Saline. May Shower, gently pat wound dry prior to applying new dressing. - Please use Dial antibacterial soap to wash your wounds Wound #7 Left Knee: Clean wound with Normal Saline. May Shower, gently pat wound dry prior to applying new dressing. - Please use Dial antibacterial soap to wash your wounds Wound #8 Right Knee: Clean wound with Normal Saline. May Shower, gently pat wound dry prior to applying new dressing. - Please use Dial antibacterial soap to wash your wounds Anesthetic (add to Medication List): Wound #1 Left Elbow: Topical Lidocaine 4% cream applied to wound  bed prior to debridement (In Clinic Only). Wound #2 Left,Proximal Forearm: Topical Lidocaine 4% cream applied to wound bed prior to debridement (In Clinic Only). Wound #3 Left,Distal Forearm: Topical Lidocaine 4% cream applied to wound bed prior to debridement (In Clinic Only). Wound #4 Left Wrist: Topical Lidocaine 4% cream applied to wound bed prior to debridement (In Clinic Only). Wound #5 Left Hand - 5th Digit: Topical Lidocaine 4% cream applied to wound bed prior to debridement (In Clinic Only). Wound #6 Left,Lateral Knee: Topical Lidocaine 4% cream applied to wound bed prior to debridement (In Clinic Only). Wound #7 Left Knee: Topical Lidocaine 4% cream applied to wound bed prior to debridement (In Clinic Only). Wound #8 Right Knee: Topical Lidocaine 4% cream applied to wound bed prior to debridement (In Clinic Only). Primary Wound Dressing: Wound #1 Left Elbow: Xeroform Wound #2 Left,Proximal Forearm: Xeroform Wound #3 Left,Distal Forearm: FINIS, HENDRICKSEN ([id number]) Xeroform Wound #4 Left Wrist: Xeroform Wound #5 Left Hand -  5th Digit: Xeroform Wound #6 Left,Lateral Knee: Xeroform Wound #7 Left Knee: Xeroform Wound #8 Right Knee: Xeroform Secondary Dressing: Wound #1 Left Elbow: Conform/Kerlix Non-adherent pad Wound #2 Left,Proximal Forearm: Conform/Kerlix Non-adherent pad Wound #3 Left,Distal Forearm: Conform/Kerlix Non-adherent pad Wound #4 Left Wrist: Other - coverlet or bandaid Wound #5 Left Hand - 5th Digit: Other - coverlet or bandaid Wound #6 Left,Lateral Knee: Boardered Foam Dressing Wound #7 Left Knee: Boardered Foam Dressing Wound #8 Right Knee: Boardered Foam Dressing Dressing Change Frequency: Wound #1 Left Elbow: Change dressing every day. Wound #2 Left,Proximal Forearm: Change dressing every day. Wound #3 Left,Distal Forearm: Change dressing every day. Wound #4 Left Wrist: Change dressing every day. Wound #5 Left Hand - 5th Digit: Change dressing every day. Wound #6 Left,Lateral Knee: Change dressing every day. Wound #7 Left Knee: Change dressing every day. Wound #8 Right Knee: Change dressing every day. Follow-up Appointments: Wound #1 Left Elbow: Return Appointment in 1 week. Wound #2 Left,Proximal Forearm: Return Appointment in 1 week. Wound #3 Left,Distal Forearm: Return Appointment in 1 week. Wound #4 Left Wrist: Return Appointment in 1 week. Wound #5 Left Hand - 5th Digit: Return Appointment in 1 week. Wound #6 Left,Lateral Knee: BASEL, DEFALCO ([id number]) Return Appointment in 1 week. Wound #7 Left Knee: Return Appointment in 1 week. Wound #8 Right Knee: Return Appointment in 1 week. Currently my suggestion is gonna be that we go ahead and initiate the above wound care measures for the next week. The patient and his wife are in agreement with the plan she was shown how to perform the dressing changes today. We will subsequently see were things stand at follow-up. Please see above for specific wound care orders. We will see patient for re-evaluation  in 1 week(s) here in the clinic. If anything worsens or changes patient will contact our office for additional recommendations. Electronic Signature(s) Signed: 09/04/2018 5:02:32 PM By: Lenda Kelp PA-C Entered By: Lenda Kelp on 09/04/2018 17:01:03 George Hensley ([id number]) -------------------------------------------------------------------------------- ROS/PFSH Details Patient Name: JUANMANUEL, MAROHL. Date of Service: 09/04/2018 2:30 PM Medical Record Number: [id number] Patient Account Number: [id number] Date of Birth/Sex: 1931-04-20 (85 y.o. Male) Treating RN: Arnette Norris Primary Care Provider: Bethann Punches Other Clinician: Referring Provider: Bethann Punches Treating Provider/Extender: Linwood Dibbles, Alida Greiner Weeks in Treatment: 0 Information Obtained From Patient Wound History Do you currently have one or more open woundso Yes How many open wounds do you currently haveo 6 Approximately how long have you had your woundso 1 week  How have you been treating your wound(s) until nowo non adherant dressing, compression Has your wound(s) ever healed and then re-openedo No Have you had any lab work done in the past montho No Have you tested positive for an antibiotic resistant organism (MRSA, VRE)o No Have you tested positive for osteomyelitis (bone infection)o No Have you had any tests for circulation on your legso No Integumentary (Skin) Complaints and Symptoms: Positive for: Wounds Medical History: Negative for: History of Burn; History of pressure wounds Eyes Medical History: Past Medical History Notes: macular degeneration Ear/Nose/Mouth/Throat Complaints and Symptoms: No Complaints or Symptoms Respiratory Medical History: Positive for: Asthma Cardiovascular Complaints and Symptoms: No Complaints or Symptoms Medical History: Positive for: Hypertension Past Medical History Notes: mitral valve disorder 2000 stent placed Gastrointestinal OSHEA, PERCIVAL  ([id number]) Complaints and Symptoms: No Complaints or Symptoms Endocrine Complaints and Symptoms: No Complaints or Symptoms Medical History: Positive for: Type II Diabetes - 20 years oral meds Time with diabetes: 20 years Treated with: Oral agents Blood sugar tested every day: No Genitourinary Complaints and Symptoms: No Complaints or Symptoms Immunological Complaints and Symptoms: No Complaints or Symptoms Musculoskeletal Complaints and Symptoms: No Complaints or Symptoms Neurologic Complaints and Symptoms: No Complaints or Symptoms Medical History: Past Medical History Notes: Stroke Oncologic Complaints and Symptoms: No Complaints or Symptoms Immunizations Pneumococcal Vaccine: Received Pneumococcal Vaccination: Yes Implantable Devices Family and Social History Former smoker - cigar; Marital Status - Married; Alcohol Use: Never; Drug Use: No History; Caffeine Use: Daily; Financial Concerns: No; Food, Clothing or Shelter Needs: No; Support System Lacking: No; Transportation Concerns: No Electronic Signature(s) Signed: 09/04/2018 5:02:32 PM By: Lenda Kelp PA-C Signed: 09/08/2018 3:50:02 PM By: Tennis Ship ([id number]) Entered By: Arnette Norris on 09/04/2018 15:10:16 George Hensley ([id number]) -------------------------------------------------------------------------------- SuperBill Details Patient Name: QUANTA, ROHER. Date of Service: 09/04/2018 Medical Record Number: [id number] Patient Account Number: [id number] Date of Birth/Sex: September 01, 1930 (85 y.o. Male) Treating RN: Huel Coventry Primary Care Provider: Bethann Punches Other Clinician: Referring Provider: Bethann Punches Treating Provider/Extender: Linwood Dibbles, Safiyah Cisney Weeks in Treatment: 0 Diagnosis Coding ICD-10 Codes Code Description S51.002A Unspecified open wound of left elbow, initial encounter S51.802A Unspecified open wound of left forearm, initial encounter S61.502A Unspecified open  wound of left wrist, initial encounter S61.402A Unspecified open wound of left hand, initial encounter S81.002A Unspecified open wound, left knee, initial encounter S81.001A Unspecified open wound, right knee, initial encounter I10 Essential (primary) hypertension E11.622 Type 2 diabetes mellitus with other skin ulcer Facility Procedures CPT4 Code: [id number] Description: 99213 - WOUND CARE VISIT-LEV 3 EST PT Modifier: Quantity: 1 CPT4 Code: [id number] Description: 97597 - DEBRIDE WOUND 1ST 20 SQ CM OR < ICD-10 Diagnosis Description S51.002A Unspecified open wound of left elbow, initial encounter S51.802A Unspecified open wound of left forearm, initial encounter S61.502A Unspecified open wound of left  wrist, initial encounter S81.002A Unspecified open wound, left knee, initial encounter Modifier: Quantity: 1 CPT4 Code: [id number] Description: 97598 - DEBRIDE WOUND EA ADDL 20 SQ CM ICD-10 Diagnosis Description S51.002A Unspecified open wound of left elbow, initial encounter S51.802A Unspecified open wound of left forearm, initial encounter S61.502A Unspecified open wound of left  wrist, initial encounter S81.002A Unspecified open wound, left knee, initial encounter Modifier: Quantity: 1 Physician Procedures CPT4 Code: [id number] JERRE, VANDRUNEN Description: [phone] - WC PHYS LEVEL 4 - NEW PT ICD-10 Diagnosis Description S51.002A Unspecified open wound of left elbow, initial encounter S51.802A Unspecified open wound of left forearm, initial encounter S61.502A Unspecified open wound  of left wrist,  initial encounter S61.402A Unspecified open wound of left hand, initial encounter . ([id number]) Modifier: 25 Quantity: 1 Electronic Signature(s) Signed: 09/04/2018 5:02:32 PM By: Lenda Kelp PA-C Entered By: Lenda Kelp on 09/04/2018 17:01:52

## 2018-09-11 ENCOUNTER — Encounter: Payer: Medicare Other | Admitting: Physician Assistant

## 2018-09-11 DIAGNOSIS — L98492 Non-pressure chronic ulcer of skin of other sites with fat layer exposed: Secondary | ICD-10-CM | POA: Diagnosis not present

## 2018-09-11 DIAGNOSIS — E11622 Type 2 diabetes mellitus with other skin ulcer: Secondary | ICD-10-CM | POA: Diagnosis not present

## 2018-09-11 DIAGNOSIS — S81002A Unspecified open wound, left knee, initial encounter: Secondary | ICD-10-CM | POA: Diagnosis not present

## 2018-09-11 DIAGNOSIS — S81001A Unspecified open wound, right knee, initial encounter: Secondary | ICD-10-CM | POA: Diagnosis not present

## 2018-09-11 DIAGNOSIS — L97822 Non-pressure chronic ulcer of other part of left lower leg with fat layer exposed: Secondary | ICD-10-CM | POA: Diagnosis not present

## 2018-09-11 DIAGNOSIS — H3530 Unspecified macular degeneration: Secondary | ICD-10-CM | POA: Diagnosis not present

## 2018-09-11 DIAGNOSIS — I10 Essential (primary) hypertension: Secondary | ICD-10-CM | POA: Diagnosis not present

## 2018-09-11 DIAGNOSIS — S51802A Unspecified open wound of left forearm, initial encounter: Secondary | ICD-10-CM | POA: Diagnosis not present

## 2018-09-11 DIAGNOSIS — S51002A Unspecified open wound of left elbow, initial encounter: Secondary | ICD-10-CM | POA: Diagnosis not present

## 2018-09-11 DIAGNOSIS — Z8673 Personal history of transient ischemic attack (TIA), and cerebral infarction without residual deficits: Secondary | ICD-10-CM | POA: Diagnosis not present

## 2018-09-11 DIAGNOSIS — Z87891 Personal history of nicotine dependence: Secondary | ICD-10-CM | POA: Diagnosis not present

## 2018-09-11 DIAGNOSIS — J45909 Unspecified asthma, uncomplicated: Secondary | ICD-10-CM | POA: Diagnosis not present

## 2018-09-11 DIAGNOSIS — S61402A Unspecified open wound of left hand, initial encounter: Secondary | ICD-10-CM | POA: Diagnosis not present

## 2018-09-11 DIAGNOSIS — S61502A Unspecified open wound of left wrist, initial encounter: Secondary | ICD-10-CM | POA: Diagnosis not present

## 2018-09-12 DIAGNOSIS — S50902A Unspecified superficial injury of left elbow, initial encounter: Secondary | ICD-10-CM | POA: Diagnosis not present

## 2018-09-13 NOTE — Progress Notes (Signed)
George Hensley, George Hensley ([id number]) Visit Report for 09/11/2018 Arrival Information Details Patient Name: George Hensley, George Hensley. Date of Service: 09/11/2018 12:45 PM Medical Record Number: [id number] Patient Account Number: [id number] Date of Birth/Sex: 25-Dec-1930 (85 y.o. Male) Treating RN: Curtis Sites Primary Care Deissy Guilbert: Bethann Punches Other Clinician: Referring Cayleen Benjamin: Bethann Punches Treating Alaster Asfaw/Extender: Linwood Dibbles, HOYT Weeks in Treatment: 1 Visit Information History Since Last Visit Added or deleted any medications: No Patient Arrived: Ambulatory Any new allergies or adverse reactions: No Arrival Time: 12:55 Had a fall or experienced change in No Accompanied By: wife activities of daily living that may affect Transfer Assistance: None risk of falls: Patient Identification Verified: Yes Signs or symptoms of abuse/neglect since last visito No Secondary Verification Process Completed: Yes Hospitalized since last visit: No Implantable device outside of the clinic excluding No cellular tissue based products placed in the center since last visit: Has Dressing in Place as Prescribed: Yes Pain Present Now: No Electronic Signature(s) Signed: 09/11/2018 4:16:05 PM By: Dayton Martes RCP, RRT, CHT Entered By: Dayton Martes on 09/11/2018 12:56:20 George Hensley ([id number]) -------------------------------------------------------------------------------- Encounter Discharge Information Details Patient Name: George Hensley, George Hensley. Date of Service: 09/11/2018 12:45 PM Medical Record Number: [id number] Patient Account Number: [id number] Date of Birth/Sex: [birth year] (85 y.o. Male) Treating RN: Curtis Sites Primary Care Adisen Bennion: Bethann Punches Other Clinician: Referring Gurvir Schrom: Bethann Punches Treating Onesti Bonfiglio/Extender: Linwood Dibbles, HOYT Weeks in Treatment: 1 Encounter Discharge Information Items Post Procedure Vitals Discharge Condition: Stable Temperature  (F): 98.3 Ambulatory Status: Ambulatory Pulse (bpm): 88 Discharge Destination: Home Respiratory Rate (breaths/min): 16 Transportation: Private Auto Blood Pressure (mmHg): 114/66 Accompanied By: spouse Schedule Follow-up Appointment: Yes Clinical Summary of Care: Electronic Signature(s) Signed: 09/11/2018 4:56:33 PM By: Curtis Sites Entered By: Curtis Sites on 09/11/2018 13:38:20 George Hensley ([id number]) -------------------------------------------------------------------------------- Lower Extremity Assessment Details Patient Name: George Hensley. Date of Service: 09/11/2018 12:45 PM Medical Record Number: [id number] Patient Account Number: [id number] Date of Birth/Sex: 1931-05-19 (85 y.o. Male) Treating RN: Huel Coventry Primary Care Marshell Rieger: Bethann Punches Other Clinician: Referring Ravyn Nikkel: Bethann Punches Treating Jozlynn Plaia/Extender: Linwood Dibbles, HOYT Weeks in Treatment: 1 Vascular Assessment Pulses: Dorsalis Pedis Palpable: [Left:Yes] [Right:Yes] Posterior Tibial Palpable: [Left:Yes] [Right:Yes] Extremity colors, hair growth, and conditions: Extremity Color: [Left:Normal] [Right:Normal] Hair Growth on Extremity: [Left:Yes] [Right:Yes] Temperature of Extremity: [Left:Warm] [Right:Warm] Capillary Refill: [Left:< 3 seconds] [Right:< 3 seconds] Toe Nail Assessment Left: Right: Thick: No No Discolored: No No Deformed: No No Improper Length and Hygiene: No No Electronic Signature(s) Signed: 09/11/2018 5:58:00 PM By: Elliot Gurney, BSN, RN, CWS, Kim RN, BSN Entered By: Elliot Gurney, BSN, RN, CWS, Kim on 09/11/2018 13:16:55 George Hensley ([id number]) -------------------------------------------------------------------------------- Multi Wound Chart Details Patient Name: George Hensley, George Hensley. Date of Service: 09/11/2018 12:45 PM Medical Record Number: [id number] Patient Account Number: [id number] Date of Birth/Sex: March 11, 1931 (85 y.o. Male) Treating RN: Curtis Sites Primary Care  Romayne Ticas: Bethann Punches Other Clinician: Referring Cecilie Heidel: Bethann Punches Treating Mackinze Criado/Extender: Linwood Dibbles, HOYT Weeks in Treatment: 1 Vital Signs Height(in): 70 Pulse(bpm): 88 Weight(lbs): 160 Blood Pressure(mmHg): 114/66 Body Mass Index(BMI): 23 Temperature(F): 98.3 Respiratory Rate 18 (breaths/min): Photos: [1:No Photos] [2:No Photos] [3:No Photos] Wound Location: [1:Left Elbow] [2:Left Forearm - Proximal] [3:Left Forearm - Distal] Wounding Event: [1:Trauma] [2:Trauma] [3:Trauma] Primary Etiology: [1:Trauma, Other] [2:Trauma, Other] [3:Trauma, Other] Comorbid History: [1:Asthma, Hypertension, Type II Diabetes] [2:Asthma, Hypertension, Type II Diabetes] [3:Asthma, Hypertension, Type II Diabetes] Date Acquired: [1:08/30/2018] [2:08/30/2018] [3:08/30/2018] Weeks of Treatment: [1:1] [2:1] [3:1] Wound Status: [1:Open] [2:Open] [3:Open] Clustered Wound: [1:No] [2:No] [3:No]  Clustered Quantity: [1:N/A] [2:N/A] [3:N/A] Measurements L x W x D [1:3.1x2.5x0.1] [2:2x1x0.1] [3:2.4x5x0.1] (cm) Area (cm) : [1:6.087] [2:1.571] [3:9.425] Volume (cm) : [1:0.609] [2:0.157] [3:0.942] % Reduction in Area: [1:44.60%] [2:52.40%] [3:-14.30%] % Reduction in Volume: [1:44.60%] [2:52.40%] [3:-14.20%] Classification: [1:Full Thickness Without Exposed Support Structures] [2:Full Thickness Without Exposed Support Structures] [3:Full Thickness Without Exposed Support Structures] Exudate Amount: [1:Large] [2:Large] [3:Large] Exudate Type: [1:Serosanguineous] [2:Serosanguineous] [3:Serosanguineous] Exudate Color: [1:red, brown] [2:red, brown] [3:red, brown] Wound Margin: [1:Flat and Intact] [2:Flat and Intact] [3:Flat and Intact] Granulation Amount: [1:Small (1-33%)] [2:Small (1-33%)] [3:Small (1-33%)] Granulation Quality: [1:Pale] [2:Pink] [3:Pink] Necrotic Amount: [1:Large (67-100%)] [2:Medium (34-66%)] [3:Large (67-100%)] Necrotic Tissue: [1:Eschar, Adherent Slough] [2:Eschar] [3:Eschar, Adherent  Slough] Exposed Structures: [1:Fat Layer (Subcutaneous Tissue) Exposed: Yes Fascia: No Tendon: No Muscle: No Joint: No Bone: No] [2:Fat Layer (Subcutaneous Tissue) Exposed: Yes Fascia: No Tendon: No Muscle: No Joint: No Bone: No] [3:Fat Layer (Subcutaneous Tissue) Exposed: Yes  Fascia: No Tendon: No Muscle: No Joint: No Bone: No] Epithelialization: [1:None] [2:None] [3:None] Periwound Skin Texture: [1:No Abnormalities Noted] [2:Excoriation: No Induration: No] [3:No Abnormalities Noted] Callus: No Crepitus: No Rash: No Scarring: No Periwound Skin Moisture: No Abnormalities Noted Maceration: No No Abnormalities Noted Dry/Scaly: No Periwound Skin Color: Hemosiderin Staining: Yes Hemosiderin Staining: Yes Hemosiderin Staining: Yes Atrophie Blanche: No Cyanosis: No Ecchymosis: No Erythema: No Mottled: No Pallor: No Rubor: No Erythema Location: N/A N/A N/A Temperature: No Abnormality No Abnormality No Abnormality Tenderness on Palpation: Yes Yes Yes Wound Preparation: Ulcer Cleansing: Ulcer Cleansing: Ulcer Cleansing: Rinsed/Irrigated with Saline, Rinsed/Irrigated with Saline, Rinsed/Irrigated with Saline, Other: soap and water Other: soap and water Other: soap and water Topical Anesthetic Applied: Topical Anesthetic Applied: Topical Anesthetic Applied: Other: lidocaine 4% Other: lidocaine 4% Other: lidocaine 4% Wound Number: [id number]  Photos: No Photos No Photos No Photos Wound Location: Left Wrist Left Hand - 5th Digit Left Knee - Lateral Wounding Event: Trauma Trauma Trauma Primary Etiology: Trauma, Other Trauma, Other Trauma, Other Comorbid History: Asthma, Hypertension, Type II Asthma, Hypertension, Type II Asthma, Hypertension, Type II Diabetes Diabetes Diabetes Date Acquired: 08/30/2018 08/30/2018 08/30/2018 Weeks of Treatment: 1 1 1  Wound Status: Open Open Open Clustered Wound: No No No Clustered Quantity: N/A N/A N/A Measurements L x W x D 1.2x1x0.1 0.1x0.6x0.1  1.8x1x0.1 (cm) Area (cm) : 0.942 0.047 1.414 Volume (cm) : 0.094 0.005 0.141 % Reduction in Area: 37.50% 40.50% 10.00% % Reduction in Volume: 37.70% 37.50% 10.20% Classification: Full Thickness Without Partial Thickness Full Thickness Without Exposed Support Structures Exposed Support Structures Exudate Amount: Medium Medium Large Exudate Type: Serosanguineous Serosanguineous Serosanguineous Exudate Color: red, brown red, brown red, brown Wound Margin: Flat and Intact Flat and Intact Flat and Intact Granulation Amount: Small (1-33%) Small (1-33%) Small (1-33%) Granulation Quality: Pale Pink Pink Necrotic Amount: Large (67-100%) Large (67-100%) Large (67-100%) Necrotic Tissue: Eschar, Adherent Slough Eschar, Adherent Slough Eschar, Adherent Slough Exposed Structures: Fat Layer (Subcutaneous Fat Layer (Subcutaneous Fat Layer (Subcutaneous Tissue) Exposed: Yes Tissue) Exposed: Yes Tissue) Exposed: Yes Fascia: No Fascia: No Fascia: No Tendon: No Tendon: No Tendon: No Muscle: No Muscle: No Muscle: No George Hensley, George Hensley ([id number]) Joint: No Joint: No Joint: No Bone: No Bone: No Bone: No Epithelialization: None None None Periwound Skin Texture: Excoriation: No Excoriation: No Excoriation: No Induration: No Induration: No Induration: No Callus: No Callus: No Callus: No Crepitus: No Crepitus: No Crepitus: No Rash: No Rash: No Rash: No Scarring: No Scarring: No Scarring: No Periwound Skin Moisture: Maceration: No Maceration: No Maceration:  No Dry/Scaly: No Dry/Scaly: No Dry/Scaly: No Periwound Skin Color: Hemosiderin Staining: Yes Hemosiderin Staining: Yes Hemosiderin Staining: Yes Atrophie Blanche: No Atrophie Blanche: No Atrophie Blanche: No Cyanosis: No Cyanosis: No Cyanosis: No Ecchymosis: No Ecchymosis: No Ecchymosis: No Erythema: No Erythema: No Erythema: No Mottled: No Mottled: No Mottled: No Pallor: No Pallor: No Pallor: No Rubor:  No Rubor: No Rubor: No Erythema Location: N/A N/A N/A Temperature: No Abnormality No Abnormality No Abnormality Tenderness on Palpation: Yes Yes Yes Wound Preparation: Ulcer Cleansing: Ulcer Cleansing: Ulcer Cleansing: Rinsed/Irrigated with Saline, Rinsed/Irrigated with Saline, Rinsed/Irrigated with Saline, Other: soap and water Other Other: soap and water Topical Anesthetic Applied: Topical Anesthetic Applied: Topical Anesthetic Applied: Other: lidocaine 4% Other: lidocaine 4% Other: lidocaine 4% Wound Number: [id number] N/A Photos: No Photos No Photos N/A Wound Location: Left Knee Right Knee N/A Wounding Event: Trauma Trauma N/A Primary Etiology: Trauma, Other Trauma, Other N/A Comorbid History: Asthma, Hypertension, Type II Asthma, Hypertension, Type II N/A Diabetes Diabetes Date Acquired: 08/30/2018 08/30/2018 N/A Weeks of Treatment: 1 1 N/A Wound Status: Open Open N/A Clustered Wound: Yes No N/A Clustered Quantity: 2 N/A N/A Measurements L x W x D 2.8x2.5x0.1 1.1x1x0.1 N/A (cm) Area (cm) : 5.498 0.864 N/A Volume (cm) : 0.55 0.086 N/A % Reduction in Area: 22.20% 23.60% N/A % Reduction in Volume: 22.20% 23.90% N/A Classification: Full Thickness Without Full Thickness Without N/A Exposed Support Structures Exposed Support Structures Exudate Amount: Large Medium N/A Exudate Type: Serosanguineous Serosanguineous N/A Exudate Color: red, brown red, brown N/A Wound Margin: Flat and Intact Flat and Intact N/A Granulation Amount: N/A Small (1-33%) N/A Granulation Quality: N/A Pink N/A Necrotic Amount: N/A Large (67-100%) N/A Necrotic Tissue: Adherent Slough Eschar, Adherent Slough N/A Righteous, Claiborne Egan. ([id number]) Exposed Structures: Fat Layer (Subcutaneous Fat Layer (Subcutaneous N/A Tissue) Exposed: Yes Tissue) Exposed: Yes Fascia: No Fascia: No Tendon: No Tendon: No Muscle: No Muscle: No Joint: No Joint: No Bone: No Bone: No Epithelialization: None None  N/A Periwound Skin Texture: Excoriation: No Excoriation: No N/A Induration: No Induration: No Callus: No Callus: No Crepitus: No Crepitus: No Rash: No Rash: No Scarring: No Scarring: No Periwound Skin Moisture: Maceration: No Maceration: No N/A Dry/Scaly: No Dry/Scaly: No Periwound Skin Color: Erythema: Yes Atrophie Blanche: No N/A Atrophie Blanche: No Cyanosis: No Cyanosis: No Ecchymosis: No Ecchymosis: No Erythema: No Hemosiderin Staining: No Hemosiderin Staining: No Mottled: No Mottled: No Pallor: No Pallor: No Rubor: No Rubor: No Erythema Location: Circumferential N/A N/A Temperature: No Abnormality No Abnormality N/A Tenderness on Palpation: Yes Yes N/A Wound Preparation: Ulcer Cleansing: Ulcer Cleansing: N/A Rinsed/Irrigated with Saline, Rinsed/Irrigated with Saline, Other: soap and water Other: soap and water Topical Anesthetic Applied: Topical Anesthetic Applied: Other: lidocaine 4% Other: lidocaine 4% Treatment Notes Electronic Signature(s) Signed: 09/11/2018 4:56:33 PM By: Curtis Sites Entered By: Curtis Sites on 09/11/2018 13:29:54 George Hensley ([id number]) -------------------------------------------------------------------------------- Multi-Disciplinary Care Plan Details Patient Name: George Hensley, George Hensley. Date of Service: 09/11/2018 12:45 PM Medical Record Number: [id number] Patient Account Number: [id number] Date of Birth/Sex: [birth year] (85 y.o. Male) Treating RN: Curtis Sites Primary Care Lerline Valdivia: Bethann Punches Other Clinician: Referring Astria Jordahl: Bethann Punches Treating Sary Bogie/Extender: Linwood Dibbles, HOYT Weeks in Treatment: 1 Active Inactive Abuse / Safety / Falls / Self Care Management Nursing Diagnoses: History of Falls Goals: Patient will not experience any injury related to falls Date Initiated: 09/04/2018 Target Resolution Date: 11/29/2018 Goal Status: Active Interventions: Assess fall risk on admission and as  needed Notes: Orientation to the Wound Care  Program Nursing Diagnoses: Knowledge deficit related to the wound healing center program Goals: Patient/caregiver will verbalize understanding of the Wound Healing Center Program Date Initiated: 09/04/2018 Target Resolution Date: 11/29/2018 Goal Status: Active Interventions: Provide education on orientation to the wound center Notes: Wound/Skin Impairment Nursing Diagnoses: Impaired tissue integrity Goals: Ulcer/skin breakdown will heal within 14 weeks Date Initiated: 09/04/2018 Target Resolution Date: 11/29/2018 Goal Status: Active Interventions: Assess patient/caregiver ability to obtain necessary supplies George Hensley, George Hensley ([id number]) Assess patient/caregiver ability to perform ulcer/skin care regimen upon admission and as needed Assess ulceration(s) every visit Notes: Electronic Signature(s) Signed: 09/11/2018 4:56:33 PM By: Curtis Sites Entered By: Curtis Sites on 09/11/2018 13:29:46 George Hensley ([id number]) -------------------------------------------------------------------------------- Pain Assessment Details Patient Name: George Hensley. Date of Service: 09/11/2018 12:45 PM Medical Record Number: [id number] Patient Account Number: [id number] Date of Birth/Sex: 1930/09/11 (85 y.o. Male) Treating RN: Curtis Sites Primary Care Amiree No: Bethann Punches Other Clinician: Referring Solara Goodchild: Bethann Punches Treating Thanos Cousineau/Extender: Linwood Dibbles, HOYT Weeks in Treatment: 1 Active Problems Location of Pain Severity and Description of Pain Patient Has Paino No Site Locations Pain Management and Medication Current Pain Management: Electronic Signature(s) Signed: 09/11/2018 4:16:05 PM By: Sallee Provencal, RRT, CHT Signed: 09/11/2018 4:56:33 PM By: Curtis Sites Entered By: Dayton Martes on 09/11/2018 12:56:27 George Hensley  ([id number]) -------------------------------------------------------------------------------- Patient/Caregiver Education Details Patient Name: KENZO, OZMENT. Date of Service: 09/11/2018 12:45 PM Medical Record Number: [id number] Patient Account Number: [id number] Date of Birth/Gender: 1931-04-21 (85 y.o. Male) Treating RN: Curtis Sites Primary Care Physician: Bethann Punches Other Clinician: Referring Physician: Bethann Punches Treating Physician/Extender: Linwood Dibbles, HOYT Weeks in Treatment: 1 Education Assessment Education Provided To: Patient and Caregiver Education Topics Provided Wound/Skin Impairment: Handouts: Other: wound care as ordered Methods: Demonstration, Explain/Verbal Responses: State content correctly Electronic Signature(s) Signed: 09/11/2018 4:56:33 PM By: Curtis Sites Entered By: Curtis Sites on 09/11/2018 13:31:07 George Hensley ([id number]) -------------------------------------------------------------------------------- Wound Assessment Details Patient Name: George Hensley, George Hensley. Date of Service: 09/11/2018 12:45 PM Medical Record Number: [id number] Patient Account Number: [id number] Date of Birth/Sex: [birth year] (85 y.o. Male) Treating RN: Huel Coventry Primary Care Adalay Azucena: Bethann Punches Other Clinician: Referring Linus Weckerly: Bethann Punches Treating Braylen Staller/Extender: Linwood Dibbles, HOYT Weeks in Treatment: 1 Wound Status Wound Number: 1 Primary Etiology: Trauma, Other Wound Location: Left Elbow Wound Status: Open Wounding Event: Trauma Comorbid History: Asthma, Hypertension, Type II Diabetes Date Acquired: 08/30/2018 Weeks Of Treatment: 1 Clustered Wound: No Photos Photo Uploaded By: Elliot Gurney, BSN, RN, CWS, Kim on 09/11/2018 17:21:06 Wound Measurements Length: (cm) 3.1 Width: (cm) 2.5 Depth: (cm) 0.1 Area: (cm) 6.087 Volume: (cm) 0.609 % Reduction in Area: 44.6% % Reduction in Volume: 44.6% Epithelialization: None Tunneling: No Undermining: No Wound  Description Full Thickness Without Exposed Support Foul Odo Classification: Structures Slough/F Wound Margin: Flat and Intact Exudate Large Amount: Exudate Type: Serosanguineous Exudate Color: red, brown r After Cleansing: No ibrino Yes Wound Bed Granulation Amount: Small (1-33%) Exposed Structure Granulation Quality: Pale Fascia Exposed: No Necrotic Amount: Large (67-100%) Fat Layer (Subcutaneous Tissue) Exposed: Yes Necrotic Quality: Eschar, Adherent Slough Tendon Exposed: No Muscle Exposed: No Joint Exposed: No Bone Exposed: No George Hensley, George Hensley ([id number]) Periwound Skin Texture Texture Color No Abnormalities Noted: No No Abnormalities Noted: No Hemosiderin Staining: Yes Moisture No Abnormalities Noted: No Temperature / Pain Temperature: No Abnormality Tenderness on Palpation: Yes Wound Preparation Ulcer Cleansing: Rinsed/Irrigated with Saline, Other: soap and water, Topical Anesthetic Applied: Other: lidocaine 4%, Treatment Notes Wound #1 (Left Elbow) Notes xeroform on all  wounds, L arm - non adherent pad, abd,conform and netting; L hand and wrist - coverlets; bilateral knees - bordered foam dressing Electronic Signature(s) Signed: 09/11/2018 5:58:00 PM By: Elliot GurneyWoody, BSN, RN, CWS, Kim RN, BSN Entered By: Elliot GurneyWoody, BSN, RN, CWS, Kim on 09/11/2018 13:17:31 George ParisJOHNSON, Hasnain F. ([id number]) -------------------------------------------------------------------------------- Wound Assessment Details Patient Name: George ParisJOHNSON, Dantonio F. Date of Service: 09/11/2018 12:45 PM Medical Record Number: [id number] Patient Account Number: [id number] Date of Birth/Sex: [birth year] [age] y.o. Male) Treating RN: Huel CoventryWoody, Kim Primary Care Ciarah Peace: Bethann PunchesMiller, Mark Other Clinician: Referring Eilan Mcinerny: Bethann PunchesMiller, Mark Treating Sherisse Fullilove/Extender: Linwood DibblesSTONE III, HOYT Weeks in Treatment: 1 Wound Status Wound Number: 2 Primary Etiology: Trauma, Other Wound Location: Left Forearm - Proximal Wound Status:  Open Wounding Event: Trauma Comorbid History: Asthma, Hypertension, Type II Diabetes Date Acquired: 08/30/2018 Weeks Of Treatment: 1 Clustered Wound: No Photos Photo Uploaded By: Elliot GurneyWoody, BSN, RN, CWS, Kim on 09/11/2018 17:21:06 Wound Measurements Length: (cm) 2 Width: (cm) 1 Depth: (cm) 0.1 Area: (cm) 1.571 Volume: (cm) 0.157 % Reduction in Area: 52.4% % Reduction in Volume: 52.4% Epithelialization: None Tunneling: No Undermining: No Wound Description Full Thickness Without Exposed Support Foul Odo Classification: Structures Slough/F Wound Margin: Flat and Intact Exudate Large Amount: Exudate Type: Serosanguineous Exudate Color: red, brown r After Cleansing: No ibrino Yes Wound Bed Granulation Amount: Small (1-33%) Exposed Structure Granulation Quality: Pink Fascia Exposed: No Necrotic Amount: Medium (34-66%) Fat Layer (Subcutaneous Tissue) Exposed: Yes Necrotic Quality: Eschar Tendon Exposed: No Muscle Exposed: No Joint Exposed: No Bone Exposed: No George ParisJOHNSON, Zacarias F. ([id number]) Periwound Skin Texture Texture Color No Abnormalities Noted: No No Abnormalities Noted: No Callus: No Atrophie Blanche: No Crepitus: No Cyanosis: No Excoriation: No Ecchymosis: No Induration: No Erythema: No Rash: No Hemosiderin Staining: Yes Scarring: No Mottled: No Pallor: No Moisture Rubor: No No Abnormalities Noted: No Dry / Scaly: No Temperature / Pain Maceration: No Temperature: No Abnormality Tenderness on Palpation: Yes Wound Preparation Ulcer Cleansing: Rinsed/Irrigated with Saline, Other: soap and water, Topical Anesthetic Applied: Other: lidocaine 4%, Treatment Notes Wound #2 (Left, Proximal Forearm) Notes xeroform on all wounds, L arm - non adherent pad, abd,conform and netting; L hand and wrist - coverlets; bilateral knees - bordered foam dressing Electronic Signature(s) Signed: 09/11/2018 5:58:00 PM By: Elliot GurneyWoody, BSN, RN, CWS, Kim RN, BSN Entered By:  Elliot GurneyWoody, BSN, RN, CWS, Kim on 09/11/2018 13:17:42 George ParisJOHNSON, Marlowe F. ([id number]) -------------------------------------------------------------------------------- Wound Assessment Details Patient Name: George ParisJOHNSON, Celia F. Date of Service: 09/11/2018 12:45 PM Medical Record Number: [id number] Patient Account Number: [id number] Date of Birth/Sex: [birth year](85 y.o. Male) Treating RN: Huel CoventryWoody, Kim Primary Care Sheniya Garciaperez: Bethann PunchesMiller, Mark Other Clinician: Referring Sharrod Achille: Bethann PunchesMiller, Mark Treating Panda Crossin/Extender: Linwood DibblesSTONE III, HOYT Weeks in Treatment: 1 Wound Status Wound Number: 3 Primary Etiology: Trauma, Other Wound Location: Left Forearm - Distal Wound Status: Open Wounding Event: Trauma Comorbid History: Asthma, Hypertension, Type II Diabetes Date Acquired: 08/30/2018 Weeks Of Treatment: 1 Clustered Wound: No Photos Photo Uploaded By: Elliot GurneyWoody, BSN, RN, CWS, Kim on 09/11/2018 17:21:45 Wound Measurements Length: (cm) 2.4 Width: (cm) 5 Depth: (cm) 0.1 Area: (cm) 9.425 Volume: (cm) 0.942 % Reduction in Area: -14.3% % Reduction in Volume: -14.2% Epithelialization: None Tunneling: No Undermining: No Wound Description Full Thickness Without Exposed Support Foul Odo Classification: Structures Slough/F Wound Margin: Flat and Intact Exudate Large Amount: Exudate Type: Serosanguineous Exudate Color: red, brown r After Cleansing: No ibrino Yes Wound Bed Granulation Amount: Small (1-33%) Exposed Structure Granulation Quality: Pink Fascia Exposed: No Necrotic Amount: Large (67-100%) Fat Layer (Subcutaneous Tissue) Exposed:  Yes Necrotic Quality: Eschar, Adherent Slough Tendon Exposed: No Muscle Exposed: No Joint Exposed: No Bone Exposed: No WILLIM, TURNAGE ([id number]) Periwound Skin Texture Texture Color No Abnormalities Noted: No No Abnormalities Noted: No Hemosiderin Staining: Yes Moisture No Abnormalities Noted: No Temperature / Pain Temperature: No Abnormality Tenderness  on Palpation: Yes Wound Preparation Ulcer Cleansing: Rinsed/Irrigated with Saline, Other: soap and water, Topical Anesthetic Applied: Other: lidocaine 4%, Treatment Notes Wound #3 (Left, Distal Forearm) Notes xeroform on all wounds, L arm - non adherent pad, abd,conform and netting; L hand and wrist - coverlets; bilateral knees - bordered foam dressing Electronic Signature(s) Signed: 09/11/2018 5:58:00 PM By: Elliot Gurney, BSN, RN, CWS, Kim RN, BSN Entered By: Elliot Gurney, BSN, RN, CWS, Kim on 09/11/2018 13:17:54 George Hensley, George Hensley ([id number]) -------------------------------------------------------------------------------- Wound Assessment Details Patient Name: NAFTALI, CARCHI. Date of Service: 09/11/2018 12:45 PM Medical Record Number: [id number] Patient Account Number: [id number] Date of Birth/Sex: 11/07/1930 (85 y.o. Male) Treating RN: Huel Coventry Primary Care Samira Acero: Bethann Punches Other Clinician: Referring Avril Busser: Bethann Punches Treating Lille Karim/Extender: Linwood Dibbles, HOYT Weeks in Treatment: 1 Wound Status Wound Number: 4 Primary Etiology: Trauma, Other Wound Location: Left Wrist Wound Status: Open Wounding Event: Trauma Comorbid History: Asthma, Hypertension, Type II Diabetes Date Acquired: 08/30/2018 Weeks Of Treatment: 1 Clustered Wound: No Photos Photo Uploaded By: Elliot Gurney, BSN, RN, CWS, Kim on 09/11/2018 17:21:45 Wound Measurements Length: (cm) 1.2 Width: (cm) 1 Depth: (cm) 0.1 Area: (cm) 0.942 Volume: (cm) 0.094 % Reduction in Area: 37.5% % Reduction in Volume: 37.7% Epithelialization: None Tunneling: No Undermining: No Wound Description Full Thickness Without Exposed Support Foul Odo Classification: Structures Slough/F Wound Margin: Flat and Intact Exudate Medium Amount: Exudate Type: Serosanguineous Exudate Color: red, brown r After Cleansing: No ibrino Yes Wound Bed Granulation Amount: Small (1-33%) Exposed Structure Granulation Quality: Pale Fascia  Exposed: No Necrotic Amount: Large (67-100%) Fat Layer (Subcutaneous Tissue) Exposed: Yes Necrotic Quality: Eschar, Adherent Slough Tendon Exposed: No Muscle Exposed: No Joint Exposed: No Bone Exposed: No CHALMER, ZHENG ([id number]) Periwound Skin Texture Texture Color No Abnormalities Noted: No No Abnormalities Noted: No Callus: No Atrophie Blanche: No Crepitus: No Cyanosis: No Excoriation: No Ecchymosis: No Induration: No Erythema: No Rash: No Hemosiderin Staining: Yes Scarring: No Mottled: No Pallor: No Moisture Rubor: No No Abnormalities Noted: No Dry / Scaly: No Temperature / Pain Maceration: No Temperature: No Abnormality Tenderness on Palpation: Yes Wound Preparation Ulcer Cleansing: Rinsed/Irrigated with Saline, Other: soap and water, Topical Anesthetic Applied: Other: lidocaine 4%, Treatment Notes Wound #4 (Left Wrist) Notes xeroform on all wounds, L arm - non adherent pad, abd,conform and netting; L hand and wrist - coverlets; bilateral knees - bordered foam dressing Electronic Signature(s) Signed: 09/11/2018 5:58:00 PM By: Elliot Gurney, BSN, RN, CWS, Kim RN, BSN Entered By: Elliot Gurney, BSN, RN, CWS, Kim on 09/11/2018 13:18:06 George Hensley, George Hensley ([id number]) -------------------------------------------------------------------------------- Wound Assessment Details Patient Name: George Hensley, SCHREIER. Date of Service: 09/11/2018 12:45 PM Medical Record Number: [id number] Patient Account Number: [id number] Date of Birth/Sex: [birth year] (85 y.o. Male) Treating RN: Huel Coventry Primary Care Josejuan Hoaglin: Bethann Punches Other Clinician: Referring Tullio Chausse: Bethann Punches Treating Surena Welge/Extender: STONE III, HOYT Weeks in Treatment: 1 Wound Status Wound Number: 5 Primary Etiology: Trauma, Other Wound Location: Left Hand - 5th Digit Wound Status: Open Wounding Event: Trauma Comorbid History: Asthma, Hypertension, Type II Diabetes Date Acquired: 08/30/2018 Weeks Of Treatment:  1 Clustered Wound: No Photos Photo Uploaded By: Elliot Gurney, BSN, RN, CWS, Kim on 09/11/2018 17:22:31 Wound Measurements Length: (cm) 0.1 Width: (cm)  0.6 Depth: (cm) 0.1 Area: (cm) 0.047 Volume: (cm) 0.005 % Reduction in Area: 40.5% % Reduction in Volume: 37.5% Epithelialization: None Tunneling: No Undermining: No Wound Description Classification: Partial Thickness Wound Margin: Flat and Intact Exudate Amount: Medium Exudate Type: Serosanguineous Exudate Color: red, brown Foul Odor After Cleansing: No Slough/Fibrino Yes Wound Bed Granulation Amount: Small (1-33%) Exposed Structure Granulation Quality: Pink Fascia Exposed: No Necrotic Amount: Large (67-100%) Fat Layer (Subcutaneous Tissue) Exposed: Yes Necrotic Quality: Eschar, Adherent Slough Tendon Exposed: No Muscle Exposed: No Joint Exposed: No Bone Exposed: No Periwound Skin Texture George ParisJOHNSON, Jayceion F. ([id number]) Texture Color No Abnormalities Noted: No No Abnormalities Noted: No Callus: No Atrophie Blanche: No Crepitus: No Cyanosis: No Excoriation: No Ecchymosis: No Induration: No Erythema: No Rash: No Hemosiderin Staining: Yes Scarring: No Mottled: No Pallor: No Moisture Rubor: No No Abnormalities Noted: No Dry / Scaly: No Temperature / Pain Maceration: No Temperature: No Abnormality Tenderness on Palpation: Yes Wound Preparation Ulcer Cleansing: Rinsed/Irrigated with Saline, Other Topical Anesthetic Applied: Other: lidocaine 4%, Treatment Notes Wound #5 (Left Hand - 5th Digit) Notes xeroform on all wounds, L arm - non adherent pad, abd,conform and netting; L hand and wrist - coverlets; bilateral knees - bordered foam dressing Electronic Signature(s) Signed: 09/11/2018 5:58:00 PM By: Elliot GurneyWoody, BSN, RN, CWS, Kim RN, BSN Entered By: Elliot GurneyWoody, BSN, RN, CWS, Kim on 09/11/2018 13:18:17 George ParisJOHNSON, Rafay F. ([id number]) -------------------------------------------------------------------------------- Wound  Assessment Details Patient Name: George ParisJOHNSON, Efrem F. Date of Service: 09/11/2018 12:45 PM Medical Record Number: [id number] Patient Account Number: [id number] Date of Birth/Sex: 09-09-1930 82(85 y.o. Male) Treating RN: Huel CoventryWoody, Kim Primary Care Hanako Tipping: Bethann PunchesMiller, Mark Other Clinician: Referring Hal Norrington: Bethann PunchesMiller, Mark Treating Auburn Hert/Extender: Linwood DibblesSTONE III, HOYT Weeks in Treatment: 1 Wound Status Wound Number: 6 Primary Etiology: Trauma, Other Wound Location: Left Knee - Lateral Wound Status: Open Wounding Event: Trauma Comorbid History: Asthma, Hypertension, Type II Diabetes Date Acquired: 08/30/2018 Weeks Of Treatment: 1 Clustered Wound: No Photos Photo Uploaded By: Elliot GurneyWoody, BSN, RN, CWS, Kim on 09/11/2018 17:22:32 Wound Measurements Length: (cm) 1.8 Width: (cm) 1 Depth: (cm) 0.1 Area: (cm) 1.414 Volume: (cm) 0.141 % Reduction in Area: 10% % Reduction in Volume: 10.2% Epithelialization: None Tunneling: No Undermining: No Wound Description Full Thickness Without Exposed Support Foul Odo Classification: Structures Slough/F Wound Margin: Flat and Intact Exudate Large Amount: Exudate Type: Serosanguineous Exudate Color: red, brown r After Cleansing: No ibrino Yes Wound Bed Granulation Amount: Small (1-33%) Exposed Structure Granulation Quality: Pink Fascia Exposed: No Necrotic Amount: Large (67-100%) Fat Layer (Subcutaneous Tissue) Exposed: Yes Necrotic Quality: Eschar, Adherent Slough Tendon Exposed: No Muscle Exposed: No Joint Exposed: No Bone Exposed: No George ParisJOHNSON, Blaiden F. ([id number]) Periwound Skin Texture Texture Color No Abnormalities Noted: No No Abnormalities Noted: No Callus: No Atrophie Blanche: No Crepitus: No Cyanosis: No Excoriation: No Ecchymosis: No Induration: No Erythema: No Rash: No Hemosiderin Staining: Yes Scarring: No Mottled: No Pallor: No Moisture Rubor: No No Abnormalities Noted: No Dry / Scaly: No Temperature / Pain Maceration:  No Temperature: No Abnormality Tenderness on Palpation: Yes Wound Preparation Ulcer Cleansing: Rinsed/Irrigated with Saline, Other: soap and water, Topical Anesthetic Applied: Other: lidocaine 4%, Treatment Notes Wound #6 (Left, Lateral Knee) Notes xeroform on all wounds, L arm - non adherent pad, abd,conform and netting; L hand and wrist - coverlets; bilateral knees - bordered foam dressing Electronic Signature(s) Signed: 09/11/2018 5:58:00 PM By: Elliot GurneyWoody, BSN, RN, CWS, Kim RN, BSN Entered By: Elliot GurneyWoody, BSN, RN, CWS, Kim on 09/11/2018 13:18:28 George ParisJOHNSON, Tavius F. ([id number]) -------------------------------------------------------------------------------- Wound  Assessment Details Patient Name: BRACKEN, MOFFA. Date of Service: 09/11/2018 12:45 PM Medical Record Number: [id number] Patient Account Number: [id number] Date of Birth/Sex: 11-27-1930 (85 y.o. Male) Treating RN: Huel Coventry Primary Care Nyomie Ehrlich: Bethann Punches Other Clinician: Referring Mieka Leaton: Bethann Punches Treating Nezar Buckles/Extender: Linwood Dibbles, HOYT Weeks in Treatment: 1 Wound Status Wound Number: 7 Primary Etiology: Trauma, Other Wound Location: Left Knee Wound Status: Open Wounding Event: Trauma Comorbid History: Asthma, Hypertension, Type II Diabetes Date Acquired: 08/30/2018 Weeks Of Treatment: 1 Clustered Wound: Yes Photos Photo Uploaded By: Elliot Gurney, BSN, RN, CWS, Kim on 09/11/2018 17:23:11 Wound Measurements Length: (cm) 2.8 Width: (cm) 2.5 Depth: (cm) 0.1 Clustered Quantity: 2 Area: (cm) 5.498 Volume: (cm) 0.55 % Reduction in Area: 22.2% % Reduction in Volume: 22.2% Epithelialization: None Tunneling: No Undermining: No Wound Description Full Thickness Without Exposed Support Foul Odo Classification: Structures Slough/F Wound Margin: Flat and Intact Exudate Large Amount: Exudate Type: Serosanguineous Exudate Color: red, brown r After Cleansing: No ibrino Yes Wound Bed Necrotic Amount: Medium  (34-66%) Exposed Structure Necrotic Quality: Adherent Slough Fascia Exposed: No Fat Layer (Subcutaneous Tissue) Exposed: Yes Tendon Exposed: No Muscle Exposed: No Joint Exposed: No JARYD, DREW ([id number]) Bone Exposed: No Periwound Skin Texture Texture Color No Abnormalities Noted: No No Abnormalities Noted: No Callus: No Atrophie Blanche: No Crepitus: No Cyanosis: No Excoriation: No Ecchymosis: No Induration: No Erythema: Yes Rash: No Erythema Location: Circumferential Scarring: No Hemosiderin Staining: No Mottled: No Moisture Pallor: No No Abnormalities Noted: No Rubor: No Dry / Scaly: No Maceration: No Temperature / Pain Temperature: No Abnormality Tenderness on Palpation: Yes Wound Preparation Ulcer Cleansing: Rinsed/Irrigated with Saline, Other: soap and water, Topical Anesthetic Applied: Other: lidocaine 4%, Treatment Notes Wound #7 (Left Knee) Notes xeroform on all wounds, L arm - non adherent pad, abd,conform and netting; L hand and wrist - coverlets; bilateral knees - bordered foam dressing Electronic Signature(s) Signed: 09/11/2018 5:58:00 PM By: Elliot Gurney, BSN, RN, CWS, Kim RN, BSN Entered By: Elliot Gurney, BSN, RN, CWS, Kim on 09/11/2018 13:18:41 BRIXTON, FRANKO ([id number]) -------------------------------------------------------------------------------- Wound Assessment Details Patient Name: KENSTON, LONGTON. Date of Service: 09/11/2018 12:45 PM Medical Record Number: [id number] Patient Account Number: [id number] Date of Birth/Sex: 1931-02-14 (85 y.o. Male) Treating RN: Huel Coventry Primary Care Kenyia Wambolt: Bethann Punches Other Clinician: Referring Prabhjot Maddux: Bethann Punches Treating Joanne Salah/Extender: Linwood Dibbles, HOYT Weeks in Treatment: 1 Wound Status Wound Number: 8 Primary Etiology: Trauma, Other Wound Location: Right Knee Wound Status: Open Wounding Event: Trauma Comorbid History: Asthma, Hypertension, Type II Diabetes Date Acquired: 08/30/2018 Weeks  Of Treatment: 1 Clustered Wound: No Photos Photo Uploaded By: Elliot Gurney, BSN, RN, CWS, Kim on 09/11/2018 17:23:12 Wound Measurements Length: (cm) 1.1 Width: (cm) 1 Depth: (cm) 0.1 Area: (cm) 0.864 Volume: (cm) 0.086 % Reduction in Area: 23.6% % Reduction in Volume: 23.9% Epithelialization: None Tunneling: No Undermining: No Wound Description Full Thickness Without Exposed Support Foul Odo Classification: Structures Slough/F Wound Margin: Flat and Intact Exudate Medium Amount: Exudate Type: Serosanguineous Exudate Color: red, brown r After Cleansing: No ibrino Yes Wound Bed Granulation Amount: Small (1-33%) Exposed Structure Granulation Quality: Pink Fascia Exposed: No Necrotic Amount: Large (67-100%) Fat Layer (Subcutaneous Tissue) Exposed: Yes Necrotic Quality: Eschar, Adherent Slough Tendon Exposed: No Muscle Exposed: No Joint Exposed: No Bone Exposed: No BYARD, CARRANZA F. ([id number]) Periwound Skin Texture Texture Color No Abnormalities Noted: No No Abnormalities Noted: No Callus: No Atrophie Blanche: No Crepitus: No Cyanosis: No Excoriation: No Ecchymosis: No Induration: No Erythema: No Rash: No Hemosiderin Staining: No  Scarring: No Mottled: No Pallor: No Moisture Rubor: No No Abnormalities Noted: No Dry / Scaly: No Temperature / Pain Maceration: No Temperature: No Abnormality Tenderness on Palpation: Yes Wound Preparation Ulcer Cleansing: Rinsed/Irrigated with Saline, Other: soap and water, Topical Anesthetic Applied: Other: lidocaine 4%, Treatment Notes Wound #8 (Right Knee) Notes xeroform on all wounds, L arm - non adherent pad, abd,conform and netting; L hand and wrist - coverlets; bilateral knees - bordered foam dressing Electronic Signature(s) Signed: 09/11/2018 5:58:00 PM By: Elliot Gurney, BSN, RN, CWS, Kim RN, BSN Entered By: Elliot Gurney, BSN, RN, CWS, Kim on 09/11/2018 13:18:52 HARTWELL, VANDIVER  ([id number]) -------------------------------------------------------------------------------- Vitals Details Patient Name: XAVIAR, LUNN. Date of Service: 09/11/2018 12:45 PM Medical Record Number: [id number] Patient Account Number: [id number] Date of Birth/Sex: [birth year] (85 y.o. Male) Treating RN: Curtis Sites Primary Care Erubiel Manasco: Bethann Punches Other Clinician: Referring Nikolus Marczak: Bethann Punches Treating Graison Leinberger/Extender: Linwood Dibbles, HOYT Weeks in Treatment: 1 Vital Signs Time Taken: 12:57 Temperature (F): 98.3 Height (in): 70 Pulse (bpm): 88 Weight (lbs): 160 Respiratory Rate (breaths/min): 18 Body Mass Index (BMI): 23 Blood Pressure (mmHg): 114/66 Reference Range: 80 - 120 mg / dl Electronic Signature(s) Signed: 09/11/2018 4:16:05 PM By: Dayton Martes RCP, RRT, CHT Entered By: Dayton Martes on 09/11/2018 12:59:38

## 2018-09-13 NOTE — Progress Notes (Signed)
George Hensley, George F. ([id number]) Visit Report for 09/11/2018 Chief Complaint Document Details Patient Name: George Hensley, George F. Date of Service: 09/11/2018 12:45 PM Medical Record Number: [id number] Patient Account Number: [id number] Date of Birth/Sex: 1930/11/06 83(85 y.o. Male) Treating RN: Curtis Sitesorthy, Joanna Primary Care Provider: Bethann PunchesMiller, Mark Other Clinician: Referring Provider: Bethann PunchesMiller, Mark Treating Provider/Extender: Linwood DibblesSTONE III, HOYT Weeks in Treatment: 1 Information Obtained from: Patient Chief Complaint Multiple Left UE and Bilateral LE Ulcers Electronic Signature(s) Signed: 09/11/2018 5:12:14 PM By: Lenda KelpStone III, Hoyt PA-C Entered By: Lenda KelpStone III, Hoyt on 09/11/2018 13:38:32 George Hensley, George F. ([id number]) -------------------------------------------------------------------------------- Debridement Details Patient Name: George Hensley, George F. Date of Service: 09/11/2018 12:45 PM Medical Record Number: [id number] Patient Account Number: [id number] Date of Birth/Sex: 1930/11/06 83(85 y.o. Male) Treating RN: Curtis Sitesorthy, Joanna Primary Care Provider: Bethann PunchesMiller, Mark Other Clinician: Referring Provider: Bethann PunchesMiller, Mark Treating Provider/Extender: Linwood DibblesSTONE III, HOYT Weeks in Treatment: 1 Debridement Performed for Wound #4 Left Wrist Assessment: Performed By: Physician STONE III, HOYT E., PA-C Debridement Type: Debridement Level of Consciousness (Pre- Awake and Alert procedure): Pre-procedure Verification/Time Yes - 13:34 Out Taken: Start Time: 13:34 Pain Control: Lidocaine 4% Topical Solution Total Area Debrided (L x W): 1.2 (cm) x 1 (cm) = 1.2 (cm) Tissue and other material Viable, Non-Viable, Slough, Subcutaneous, Slough debrided: Level: Skin/Subcutaneous Tissue Debridement Description: Excisional Instrument: Curette Bleeding: Minimum Hemostasis Achieved: Pressure End Time: 13:36 Procedural Pain: 0 Post Procedural Pain: 0 Response to Treatment: Procedure was tolerated well Level of  Consciousness Awake and Alert (Post-procedure): Post Debridement Measurements of Total Wound Length: (cm) 1.2 Width: (cm) 1 Depth: (cm) 0.2 Volume: (cm) 0.188 Character of Wound/Ulcer Post Debridement: Improved Post Procedure Diagnosis Same as Pre-procedure Electronic Signature(s) Signed: 09/11/2018 4:56:33 PM By: Curtis Sitesorthy, Joanna Signed: 09/11/2018 5:12:14 PM By: Lenda KelpStone III, Hoyt PA-C Entered By: Curtis Sitesorthy, Joanna on 09/11/2018 13:34:55 George Hensley, George F. ([id number]) -------------------------------------------------------------------------------- Debridement Details Patient Name: George Hensley, Reginold F. Date of Service: 09/11/2018 12:45 PM Medical Record Number: [id number] Patient Account Number: [id number] Date of Birth/Sex: 1930/11/06 83(85 y.o. Male) Treating RN: Curtis Sitesorthy, Joanna Primary Care Provider: Bethann PunchesMiller, Mark Other Clinician: Referring Provider: Bethann PunchesMiller, Mark Treating Provider/Extender: Linwood DibblesSTONE III, HOYT Weeks in Treatment: 1 Debridement Performed for Wound #6 Left,Lateral Knee Assessment: Performed By: Physician STONE III, HOYT E., PA-C Debridement Type: Debridement Level of Consciousness (Pre- Awake and Alert procedure): Pre-procedure Verification/Time Yes - 13:36 Out Taken: Start Time: 13:36 Pain Control: Lidocaine 4% Topical Solution Total Area Debrided (L x W): 1.8 (cm) x 1 (cm) = 1.8 (cm) Tissue and other material Viable, Non-Viable, Slough, Subcutaneous, Slough debrided: Level: Skin/Subcutaneous Tissue Debridement Description: Excisional Instrument: Curette Bleeding: Minimum Hemostasis Achieved: Pressure End Time: 13:38 Procedural Pain: 0 Post Procedural Pain: 0 Response to Treatment: Procedure was tolerated well Level of Consciousness Awake and Alert (Post-procedure): Post Debridement Measurements of Total Wound Length: (cm) 1.8 Width: (cm) 1 Depth: (cm) 0.1 Volume: (cm) 0.141 Character of Wound/Ulcer Post Debridement: Improved Post Procedure  Diagnosis Same as Pre-procedure Electronic Signature(s) Signed: 09/11/2018 4:56:33 PM By: Curtis Sitesorthy, Joanna Signed: 09/11/2018 5:12:14 PM By: Lenda KelpStone III, Hoyt PA-C Entered By: Curtis Sitesorthy, Joanna on 09/11/2018 13:35:35 George Hensley, George F. ([id number]) -------------------------------------------------------------------------------- Debridement Details Patient Name: George Hensley, George F. Date of Service: 09/11/2018 12:45 PM Medical Record Number: [id number] Patient Account Number: [id number] Date of Birth/Sex: 1930/11/06 83(85 y.o. Male) Treating RN: Curtis Sitesorthy, Joanna Primary Care Provider: Bethann PunchesMiller, Mark Other Clinician: Referring Provider: Bethann PunchesMiller, Mark Treating Provider/Extender: Linwood DibblesSTONE III, HOYT Weeks in Treatment: 1 Debridement Performed for Wound #7 Left Knee Assessment: Performed By: Physician STONE III, HOYT E.,  PA-C Debridement Type: Debridement Level of Consciousness (Pre- Awake and Alert procedure): Pre-procedure Verification/Time Yes - 13:38 Out Taken: Start Time: 13:38 Pain Control: Lidocaine 4% Topical Solution Total Area Debrided (L x W): 2.8 (cm) x 2.5 (cm) = 7 (cm) Tissue and other material Viable, Non-Viable, Slough, Subcutaneous, Slough debrided: Level: Skin/Subcutaneous Tissue Debridement Description: Excisional Instrument: Curette Bleeding: Minimum Hemostasis Achieved: Pressure End Time: 13:40 Procedural Pain: 0 Post Procedural Pain: 0 Response to Treatment: Procedure was tolerated well Level of Consciousness Awake and Alert (Post-procedure): Post Debridement Measurements of Total Wound Length: (cm) 2.8 Width: (cm) 2.5 Depth: (cm) 0.2 Volume: (cm) 1.1 Character of Wound/Ulcer Post Debridement: Improved Post Procedure Diagnosis Same as Pre-procedure Electronic Signature(s) Signed: 09/11/2018 4:56:33 PM By: Curtis Sites Signed: 09/11/2018 5:12:14 PM By: Lenda Kelp PA-C Entered By: Curtis Sites on 09/11/2018 13:36:11 George Hensley  ([id number]) -------------------------------------------------------------------------------- HPI Details Patient Name: George Hensley. Date of Service: 09/11/2018 12:45 PM Medical Record Number: [id number] Patient Account Number: [id number] Date of Birth/Sex: 1930-12-23 (85 y.o. Male) Treating RN: Curtis Sites Primary Care Provider: Bethann Punches Other Clinician: Referring Provider: Bethann Punches Treating Provider/Extender: Linwood Dibbles, HOYT Weeks in Treatment: 1 History of Present Illness HPI Description: 09/04/18 on evaluation today patient actually appears to be doing somewhat poorly upon initial evaluation in regard to his left upper extremity in his bilateral knees. He unfortunately sustained a fall while he was at the beach this past Saturday 08/30/18. Nonetheless he was seen in the emergency department initially and then subsequently was seen by his primary care provider Dr. Hyacinth Meeker who referred him to [place] for further evaluation and treatment. Subsequently he was given a tetanus shot during the course of all this and also was placed on Flex 250 mg three times a day for 10 days by Dr. Hyacinth Meeker. Currently the patient has several wounds in varying stages of stability over the left upper extremity and the bilateral knee regions. He does have a history of diabetes mellitus type II and hypertension. 09/11/18 on evaluation today patient appears to be doing much better in regard to his multiple upper and lower Trinity ulcer's. All are measuring smaller today which is excellent news. Overall he has been tolerating the dressing changes although unfortunately he has not received his dressings. They been using the Bactroban ointment along with nonadherent pads they purchased at the pharmacy. Electronic Signature(s) Signed: 09/11/2018 5:12:14 PM By: Lenda Kelp PA-C Entered By: Lenda Kelp on 09/11/2018 13:58:20 George Hensley  ([id number]) -------------------------------------------------------------------------------- Physical Exam Details Patient Name: George Hensley, URENDA. Date of Service: 09/11/2018 12:45 PM Medical Record Number: [id number] Patient Account Number: [id number] Date of Birth/Sex: [birth year] (85 y.o. Male) Treating RN: Curtis Sites Primary Care Provider: Bethann Punches Other Clinician: Referring Provider: Bethann Punches Treating Provider/Extender: STONE III, HOYT Weeks in Treatment: 1 Constitutional Well-nourished and well-hydrated in no acute distress. Respiratory normal breathing without difficulty. clear to auscultation bilaterally. Psychiatric this patient is able to make decisions and demonstrates good insight into disease process. Alert and Oriented x 3. pleasant and cooperative. Notes Patient's wound bed currently shows evidence of good granulation at this time pretty much all locations. I did have to perform sharp debridement at three sites which he tolerated without any pain post debridement this seems to be doing much better. Electronic Signature(s) Signed: 09/11/2018 5:12:14 PM By: Lenda Kelp PA-C Entered By: Lenda Kelp on 09/11/2018 13:58:52 George Hensley ([id number]) -------------------------------------------------------------------------------- Physician Orders Details Patient Name: DANH, BAYUS. Date of Service:  09/11/2018 12:45 PM Medical Record Number: [id number] Patient Account Number: [id number] Date of Birth/Sex: [birth year] (85 y.o. Male) Treating RN: Curtis Sites Primary Care Provider: Bethann Punches Other Clinician: Referring Provider: Bethann Punches Treating Provider/Extender: Linwood Dibbles, HOYT Weeks in Treatment: 1 Verbal / Phone Orders: No Diagnosis Coding ICD-10 Coding Code Description S51.002A Unspecified open wound of left elbow, initial encounter S51.802A Unspecified open wound of left forearm, initial encounter S61.502A Unspecified open wound of  left wrist, initial encounter S61.402A Unspecified open wound of left hand, initial encounter S81.002A Unspecified open wound, left knee, initial encounter S81.001A Unspecified open wound, right knee, initial encounter I10 Essential (primary) hypertension E11.622 Type 2 diabetes mellitus with other skin ulcer Wound Cleansing Wound #1 Left Elbow o Clean wound with Normal Saline. o May Shower, gently pat wound dry prior to applying new dressing. - Please use Dial antibacterial soap to wash your wounds Wound #2 Left,Proximal Forearm o Clean wound with Normal Saline. o May Shower, gently pat wound dry prior to applying new dressing. - Please use Dial antibacterial soap to wash your wounds Wound #3 Left,Distal Forearm o Clean wound with Normal Saline. o May Shower, gently pat wound dry prior to applying new dressing. - Please use Dial antibacterial soap to wash your wounds Wound #4 Left Wrist o Clean wound with Normal Saline. o May Shower, gently pat wound dry prior to applying new dressing. - Please use Dial antibacterial soap to wash your wounds Wound #5 Left Hand - 5th Digit o Clean wound with Normal Saline. o May Shower, gently pat wound dry prior to applying new dressing. - Please use Dial antibacterial soap to wash your wounds Wound #6 Left,Lateral Knee o Clean wound with Normal Saline. o May Shower, gently pat wound dry prior to applying new dressing. - Please use Dial antibacterial soap to wash your wounds Wound #7 Left Knee CAVIN, LONGMAN ([id number]) o Clean wound with Normal Saline. o May Shower, gently pat wound dry prior to applying new dressing. - Please use Dial antibacterial soap to wash your wounds Wound #8 Right Knee o Clean wound with Normal Saline. o May Shower, gently pat wound dry prior to applying new dressing. - Please use Dial antibacterial soap to wash your wounds Anesthetic (add to Medication List) Wound #1 Left  Elbow o Topical Lidocaine 4% cream applied to wound bed prior to debridement (In Clinic Only). Wound #2 Left,Proximal Forearm o Topical Lidocaine 4% cream applied to wound bed prior to debridement (In Clinic Only). Wound #3 Left,Distal Forearm o Topical Lidocaine 4% cream applied to wound bed prior to debridement (In Clinic Only). Wound #4 Left Wrist o Topical Lidocaine 4% cream applied to wound bed prior to debridement (In Clinic Only). Wound #5 Left Hand - 5th Digit o Topical Lidocaine 4% cream applied to wound bed prior to debridement (In Clinic Only). Wound #6 Left,Lateral Knee o Topical Lidocaine 4% cream applied to wound bed prior to debridement (In Clinic Only). Wound #7 Left Knee o Topical Lidocaine 4% cream applied to wound bed prior to debridement (In Clinic Only). Wound #8 Right Knee o Topical Lidocaine 4% cream applied to wound bed prior to debridement (In Clinic Only). Primary Wound Dressing Wound #1 Left Elbow o Xeroform Wound #2 Left,Proximal Forearm o Xeroform Wound #3 Left,Distal Forearm o Xeroform Wound #4 Left Wrist o Xeroform Wound #5 Left Hand - 5th Digit o Xeroform Wound #6 Left,Lateral Knee o Xeroform Wound #7 Left Knee o Xeroform Wound #8 Right Knee Marinell Blight F. (  [id number]) o Xeroform Secondary Dressing Wound #1 Left Elbow o Conform/Kerlix o Non-adherent pad Wound #2 Left,Proximal Forearm o Conform/Kerlix o Non-adherent pad Wound #3 Left,Distal Forearm o Conform/Kerlix o Non-adherent pad Wound #4 Left Wrist o Other - coverlet or bandaid Wound #5 Left Hand - 5th Digit o Other - coverlet or bandaid Wound #6 Left,Lateral Knee o Boardered Foam Dressing Wound #7 Left Knee o Boardered Foam Dressing Wound #8 Right Knee o Boardered Foam Dressing Dressing Change Frequency Wound #1 Left Elbow o Change dressing every day. Wound #2 Left,Proximal Forearm o Change dressing every  day. Wound #3 Left,Distal Forearm o Change dressing every day. Wound #4 Left Wrist o Change dressing every day. Wound #5 Left Hand - 5th Digit o Change dressing every day. Wound #6 Left,Lateral Knee o Change dressing every day. Wound #7 Left Knee o Change dressing every day. Wound #8 Right Knee o Change dressing every day. Follow-up Appointments MALYK, GIROUARD ([id number]) Wound #1 Left Elbow o Return Appointment in 1 week. Wound #2 Left,Proximal Forearm o Return Appointment in 1 week. Wound #3 Left,Distal Forearm o Return Appointment in 1 week. Wound #4 Left Wrist o Return Appointment in 1 week. Wound #5 Left Hand - 5th Digit o Return Appointment in 1 week. Wound #6 Left,Lateral Knee o Return Appointment in 1 week. Wound #7 Left Knee o Return Appointment in 1 week. Wound #8 Right Knee o Return Appointment in 1 week. Electronic Signature(s) Signed: 09/11/2018 4:56:33 PM By: Curtis Sites Signed: 09/11/2018 5:12:14 PM By: Lenda Kelp PA-C Entered By: Curtis Sites on 09/11/2018 13:33:04 George Hensley ([id number]) -------------------------------------------------------------------------------- Problem List Details Patient Name: JAHMEZ, BILY. Date of Service: 09/11/2018 12:45 PM Medical Record Number: [id number] Patient Account Number: [id number] Date of Birth/Sex: 1931-04-12 (85 y.o. Male) Treating RN: Curtis Sites Primary Care Provider: Bethann Punches Other Clinician: Referring Provider: Bethann Punches Treating Provider/Extender: Linwood Dibbles, HOYT Weeks in Treatment: 1 Active Problems ICD-10 Evaluated Encounter Code Description Active Date Today Diagnosis S51.002A Unspecified open wound of left elbow, initial encounter 09/04/2018 No Yes S51.802A Unspecified open wound of left forearm, initial encounter 09/04/2018 No Yes S61.502A Unspecified open wound of left wrist, initial encounter 09/04/2018 No Yes S61.402A Unspecified open wound  of left hand, initial encounter 09/04/2018 No Yes S81.002A Unspecified open wound, left knee, initial encounter 09/04/2018 No Yes S81.001A Unspecified open wound, right knee, initial encounter 09/04/2018 No Yes I10 Essential (primary) hypertension 09/04/2018 No Yes E11.622 Type 2 diabetes mellitus with other skin ulcer 09/04/2018 No Yes Inactive Problems Resolved Problems Electronic Signature(s) Signed: 09/11/2018 5:12:14 PM By: Lenda Kelp PA-C Entered By: Lenda Kelp on 09/11/2018 13:28:20 JOSAIAH, MUHAMMED ([id number], LEUNG ([id number]) -------------------------------------------------------------------------------- Progress Note Details Patient Name: George Hensley. Date of Service: 09/11/2018 12:45 PM Medical Record Number: [id number] Patient Account Number: [id number] Date of Birth/Sex: 08-29-1930 (85 y.o. Male) Treating RN: Curtis Sites Primary Care Provider: Bethann Punches Other Clinician: Referring Provider: Bethann Punches Treating Provider/Extender: Linwood Dibbles, HOYT Weeks in Treatment: 1 Subjective Chief Complaint Information obtained from Patient Multiple Left UE and Bilateral LE Ulcers History of Present Illness (HPI) 09/04/18 on evaluation today patient actually appears to be doing somewhat poorly upon initial evaluation in regard to his left upper extremity in his bilateral knees. He unfortunately sustained a fall while he was at the beach this past Saturday 08/30/18. Nonetheless he was seen in the emergency department initially and then subsequently was seen by his primary care provider Dr. Hyacinth Meeker who referred him  to [place] for further evaluation and treatment. Subsequently he was given a tetanus shot during the course of all this and also was placed on Flex 250 mg three times a day for 10 days by Dr. Hyacinth Meeker. Currently the patient has several wounds in varying stages of stability over the left upper extremity and the bilateral knee regions. He does have a history of  diabetes mellitus type II and hypertension. 09/11/18 on evaluation today patient appears to be doing much better in regard to his multiple upper and lower Trinity ulcer's. All are measuring smaller today which is excellent news. Overall he has been tolerating the dressing changes although unfortunately he has not received his dressings. They been using the Bactroban ointment along with nonadherent pads they purchased at the pharmacy. Patient History Information obtained from Patient. Social History Former smoker - cigar, Marital Status - Married, Alcohol Use - Never, Drug Use - No History, Caffeine Use - Daily. Medical And Surgical History Notes Eyes macular degeneration Cardiovascular mitral valve disorder 2000 stent placed Neurologic Stroke Review of Systems (ROS) Constitutional Symptoms (General Health) Denies complaints or symptoms of Fever, Chills. Respiratory The patient has no complaints or symptoms. Cardiovascular The patient has no complaints or symptoms. Psychiatric The patient has no complaints or symptoms. ALMUS, WOODHAM ([id number]) Objective Constitutional Well-nourished and well-hydrated in no acute distress. Vitals Time Taken: 12:57 PM, Height: 70 in, Weight: 160 lbs, BMI: 23, Temperature: 98.3 F, Pulse: 88 bpm, Respiratory Rate: 18 breaths/min, Blood Pressure: 114/66 mmHg. Respiratory normal breathing without difficulty. clear to auscultation bilaterally. Psychiatric this patient is able to make decisions and demonstrates good insight into disease process. Alert and Oriented x 3. pleasant and cooperative. General Notes: Patient's wound bed currently shows evidence of good granulation at this time pretty much all locations. I did have to perform sharp debridement at three sites which he tolerated without any pain post debridement this seems to be doing much better. Integumentary (Hair, Skin) Wound #1 status is Open. Original cause of wound was Trauma. The  wound is located on the Left Elbow. The wound measures 3.1cm length x 2.5cm width x 0.1cm depth; 6.087cm^2 area and 0.609cm^3 volume. There is Fat Layer (Subcutaneous Tissue) Exposed exposed. There is no tunneling or undermining noted. There is a large amount of serosanguineous drainage noted. The wound margin is flat and intact. There is small (1-33%) pale granulation within the wound bed. There is a large (67-100%) amount of necrotic tissue within the wound bed including Eschar and Adherent Slough. The periwound skin appearance exhibited: Hemosiderin Staining. Periwound temperature was noted as No Abnormality. The periwound has tenderness on palpation. Wound #2 status is Open. Original cause of wound was Trauma. The wound is located on the Left,Proximal Forearm. The wound measures 2cm length x 1cm width x 0.1cm depth; 1.571cm^2 area and 0.157cm^3 volume. There is Fat Layer (Subcutaneous Tissue) Exposed exposed. There is no tunneling or undermining noted. There is a large amount of serosanguineous drainage noted. The wound margin is flat and intact. There is small (1-33%) pink granulation within the wound bed. There is a medium (34-66%) amount of necrotic tissue within the wound bed including Eschar. The periwound skin appearance exhibited: Hemosiderin Staining. The periwound skin appearance did not exhibit: Callus, Crepitus, Excoriation, Induration, Rash, Scarring, Dry/Scaly, Maceration, Atrophie Blanche, Cyanosis, Ecchymosis, Mottled, Pallor, Rubor, Erythema. Periwound temperature was noted as No Abnormality. The periwound has tenderness on palpation. Wound #3 status is Open. Original cause of wound was Trauma. The wound is located  on the Left,Distal Forearm. The wound measures 2.4cm length x 5cm width x 0.1cm depth; 9.425cm^2 area and 0.942cm^3 volume. There is Fat Layer (Subcutaneous Tissue) Exposed exposed. There is no tunneling or undermining noted. There is a large amount of serosanguineous  drainage noted. The wound margin is flat and intact. There is small (1-33%) pink granulation within the wound bed. There is a large (67-100%) amount of necrotic tissue within the wound bed including Eschar and Adherent Slough. The periwound skin appearance exhibited: Hemosiderin Staining. Periwound temperature was noted as No Abnormality. The periwound has tenderness on palpation. Wound #4 status is Open. Original cause of wound was Trauma. The wound is located on the Left Wrist. The wound measures 1.2cm length x 1cm width x 0.1cm depth; 0.942cm^2 area and 0.094cm^3 volume. There is Fat Layer (Subcutaneous Tissue) Exposed exposed. There is no tunneling or undermining noted. There is a medium amount of serosanguineous drainage noted. The wound margin is flat and intact. There is small (1-33%) pale granulation within the wound bed. There is a large (67-100%) amount of necrotic tissue within the wound bed including Eschar and Adherent Slough. The periwound skin appearance exhibited: Hemosiderin Staining. The periwound skin appearance did not exhibit: DAYLEN, LIPSKY ([id number]) Callus, Crepitus, Excoriation, Induration, Rash, Scarring, Dry/Scaly, Maceration, Atrophie Blanche, Cyanosis, Ecchymosis, Mottled, Pallor, Rubor, Erythema. Periwound temperature was noted as No Abnormality. The periwound has tenderness on palpation. Wound #5 status is Open. Original cause of wound was Trauma. The wound is located on the Left Hand - 5th Digit. The wound measures 0.1cm length x 0.6cm width x 0.1cm depth; 0.047cm^2 area and 0.005cm^3 volume. There is Fat Layer (Subcutaneous Tissue) Exposed exposed. There is no tunneling or undermining noted. There is a medium amount of serosanguineous drainage noted. The wound margin is flat and intact. There is small (1-33%) pink granulation within the wound bed. There is a large (67-100%) amount of necrotic tissue within the wound bed including Eschar and Adherent Slough. The  periwound skin appearance exhibited: Hemosiderin Staining. The periwound skin appearance did not exhibit: Callus, Crepitus, Excoriation, Induration, Rash, Scarring, Dry/Scaly, Maceration, Atrophie Blanche, Cyanosis, Ecchymosis, Mottled, Pallor, Rubor, Erythema. Periwound temperature was noted as No Abnormality. The periwound has tenderness on palpation. Wound #6 status is Open. Original cause of wound was Trauma. The wound is located on the Left,Lateral Knee. The wound measures 1.8cm length x 1cm width x 0.1cm depth; 1.414cm^2 area and 0.141cm^3 volume. There is Fat Layer (Subcutaneous Tissue) Exposed exposed. There is no tunneling or undermining noted. There is a large amount of serosanguineous drainage noted. The wound margin is flat and intact. There is small (1-33%) pink granulation within the wound bed. There is a large (67-100%) amount of necrotic tissue within the wound bed including Eschar and Adherent Slough. The periwound skin appearance exhibited: Hemosiderin Staining. The periwound skin appearance did not exhibit: Callus, Crepitus, Excoriation, Induration, Rash, Scarring, Dry/Scaly, Maceration, Atrophie Blanche, Cyanosis, Ecchymosis, Mottled, Pallor, Rubor, Erythema. Periwound temperature was noted as No Abnormality. The periwound has tenderness on palpation. Wound #7 status is Open. Original cause of wound was Trauma. The wound is located on the Left Knee. The wound measures 2.8cm length x 2.5cm width x 0.1cm depth; 5.498cm^2 area and 0.55cm^3 volume. There is Fat Layer (Subcutaneous Tissue) Exposed exposed. There is no tunneling or undermining noted. There is a large amount of serosanguineous drainage noted. The wound margin is flat and intact. There is a medium (34-66%) amount of necrotic tissue within the wound bed including Adherent  Slough. The periwound skin appearance exhibited: Erythema. The periwound skin appearance did not exhibit: Callus, Crepitus, Excoriation, Induration,  Rash, Scarring, Dry/Scaly, Maceration, Atrophie Blanche, Cyanosis, Ecchymosis, Hemosiderin Staining, Mottled, Pallor, Rubor. The surrounding wound skin color is noted with erythema which is circumferential. Periwound temperature was noted as No Abnormality. The periwound has tenderness on palpation. Wound #8 status is Open. Original cause of wound was Trauma. The wound is located on the Right Knee. The wound measures 1.1cm length x 1cm width x 0.1cm depth; 0.864cm^2 area and 0.086cm^3 volume. There is Fat Layer (Subcutaneous Tissue) Exposed exposed. There is no tunneling or undermining noted. There is a medium amount of serosanguineous drainage noted. The wound margin is flat and intact. There is small (1-33%) pink granulation within the wound bed. There is a large (67-100%) amount of necrotic tissue within the wound bed including Eschar and Adherent Slough. The periwound skin appearance did not exhibit: Callus, Crepitus, Excoriation, Induration, Rash, Scarring, Dry/Scaly, Maceration, Atrophie Blanche, Cyanosis, Ecchymosis, Hemosiderin Staining, Mottled, Pallor, Rubor, Erythema. Periwound temperature was noted as No Abnormality. The periwound has tenderness on palpation. Assessment Active Problems ICD-10 Unspecified open wound of left elbow, initial encounter Unspecified open wound of left forearm, initial encounter Unspecified open wound of left wrist, initial encounter Unspecified open wound of left hand, initial encounter Unspecified open wound, left knee, initial encounter Unspecified open wound, right knee, initial encounter Essential (primary) hypertension Type 2 diabetes mellitus with other skin ulcer GIANI, BETZOLD ([id number]) Procedures Wound #4 Pre-procedure diagnosis of Wound #4 is a Trauma, Other located on the Left Wrist . There was a Excisional Skin/Subcutaneous Tissue Debridement with a total area of 1.2 sq cm performed by STONE III, HOYT E., PA-C. With the following  instrument(s): Curette to remove Viable and Non-Viable tissue/material. Material removed includes Subcutaneous Tissue and Slough and after achieving pain control using Lidocaine 4% Topical Solution. No specimens were taken. A time out was conducted at 13:34, prior to the start of the procedure. A Minimum amount of bleeding was controlled with Pressure. The procedure was tolerated well with a pain level of 0 throughout and a pain level of 0 following the procedure. Post Debridement Measurements: 1.2cm length x 1cm width x 0.2cm depth; 0.188cm^3 volume. Character of Wound/Ulcer Post Debridement is improved. Post procedure Diagnosis Wound #4: Same as Pre-Procedure Wound #6 Pre-procedure diagnosis of Wound #6 is a Trauma, Other located on the Left,Lateral Knee . There was a Excisional Skin/Subcutaneous Tissue Debridement with a total area of 1.8 sq cm performed by STONE III, HOYT E., PA-C. With the following instrument(s): Curette to remove Viable and Non-Viable tissue/material. Material removed includes Subcutaneous Tissue and Slough and after achieving pain control using Lidocaine 4% Topical Solution. No specimens were taken. A time out was conducted at 13:36, prior to the start of the procedure. A Minimum amount of bleeding was controlled with Pressure. The procedure was tolerated well with a pain level of 0 throughout and a pain level of 0 following the procedure. Post Debridement Measurements: 1.8cm length x 1cm width x 0.1cm depth; 0.141cm^3 volume. Character of Wound/Ulcer Post Debridement is improved. Post procedure Diagnosis Wound #6: Same as Pre-Procedure Wound #7 Pre-procedure diagnosis of Wound #7 is a Trauma, Other located on the Left Knee . There was a Excisional Skin/Subcutaneous Tissue Debridement with a total area of 7 sq cm performed by STONE III, HOYT E., PA-C. With the following instrument(s): Curette to remove Viable and Non-Viable tissue/material. Material removed includes  Subcutaneous Tissue and [facility]  and after achieving pain control using Lidocaine 4% Topical Solution. No specimens were taken. A time out was conducted at 13:38, prior to the start of the procedure. A Minimum amount of bleeding was controlled with Pressure. The procedure was tolerated well with a pain level of 0 throughout and a pain level of 0 following the procedure. Post Debridement Measurements: 2.8cm length x 2.5cm width x 0.2cm depth; 1.1cm^3 volume. Character of Wound/Ulcer Post Debridement is improved. Post procedure Diagnosis Wound #7: Same as Pre-Procedure Plan Wound Cleansing: Wound #1 Left Elbow: Clean wound with Normal Saline. May Shower, gently pat wound dry prior to applying new dressing. - Please use Dial antibacterial soap to wash your wounds Wound #2 Left,Proximal Forearm: Clean wound with Normal Saline. May Shower, gently pat wound dry prior to applying new dressing. - Please use Dial antibacterial soap to wash your wounds Wound #3 Left,Distal Forearm: George Hensley, George F. ([id number]) Clean wound with Normal Saline. May Shower, gently pat wound dry prior to applying new dressing. - Please use Dial antibacterial soap to wash your wounds Wound #4 Left Wrist: Clean wound with Normal Saline. May Shower, gently pat wound dry prior to applying new dressing. - Please use Dial antibacterial soap to wash your wounds Wound #5 Left Hand - 5th Digit: Clean wound with Normal Saline. May Shower, gently pat wound dry prior to applying new dressing. - Please use Dial antibacterial soap to wash your wounds Wound #6 Left,Lateral Knee: Clean wound with Normal Saline. May Shower, gently pat wound dry prior to applying new dressing. - Please use Dial antibacterial soap to wash your wounds Wound #7 Left Knee: Clean wound with Normal Saline. May Shower, gently pat wound dry prior to applying new dressing. - Please use Dial antibacterial soap to wash your wounds Wound #8 Right Knee: Clean  wound with Normal Saline. May Shower, gently pat wound dry prior to applying new dressing. - Please use Dial antibacterial soap to wash your wounds Anesthetic (add to Medication List): Wound #1 Left Elbow: Topical Lidocaine 4% cream applied to wound bed prior to debridement (In Clinic Only). Wound #2 Left,Proximal Forearm: Topical Lidocaine 4% cream applied to wound bed prior to debridement (In Clinic Only). Wound #3 Left,Distal Forearm: Topical Lidocaine 4% cream applied to wound bed prior to debridement (In Clinic Only). Wound #4 Left Wrist: Topical Lidocaine 4% cream applied to wound bed prior to debridement (In Clinic Only). Wound #5 Left Hand - 5th Digit: Topical Lidocaine 4% cream applied to wound bed prior to debridement (In Clinic Only). Wound #6 Left,Lateral Knee: Topical Lidocaine 4% cream applied to wound bed prior to debridement (In Clinic Only). Wound #7 Left Knee: Topical Lidocaine 4% cream applied to wound bed prior to debridement (In Clinic Only). Wound #8 Right Knee: Topical Lidocaine 4% cream applied to wound bed prior to debridement (In Clinic Only). Primary Wound Dressing: Wound #1 Left Elbow: Xeroform Wound #2 Left,Proximal Forearm: Xeroform Wound #3 Left,Distal Forearm: Xeroform Wound #4 Left Wrist: Xeroform Wound #5 Left Hand - 5th Digit: Xeroform Wound #6 Left,Lateral Knee: Xeroform Wound #7 Left Knee: Xeroform Wound #8 Right Knee: Xeroform Secondary Dressing: Wound #1 Left Elbow: Conform/Kerlix Non-adherent pad Wound #2 Left,Proximal Forearm: Conform/Kerlix Non-adherent pad Wound #3 Left,Distal Forearm: Conform/Kerlix George Hensley, George F. ([id number]) Non-adherent pad Wound #4 Left Wrist: Other - coverlet or bandaid Wound #5 Left Hand - 5th Digit: Other - coverlet or bandaid Wound #6 Left,Lateral Knee: Boardered Foam Dressing Wound #7 Left Knee: Boardered Foam Dressing Wound #8 Right  Knee: Boardered Foam Dressing Dressing Change  Frequency: Wound #1 Left Elbow: Change dressing every day. Wound #2 Left,Proximal Forearm: Change dressing every day. Wound #3 Left,Distal Forearm: Change dressing every day. Wound #4 Left Wrist: Change dressing every day. Wound #5 Left Hand - 5th Digit: Change dressing every day. Wound #6 Left,Lateral Knee: Change dressing every day. Wound #7 Left Knee: Change dressing every day. Wound #8 Right Knee: Change dressing every day. Follow-up Appointments: Wound #1 Left Elbow: Return Appointment in 1 week. Wound #2 Left,Proximal Forearm: Return Appointment in 1 week. Wound #3 Left,Distal Forearm: Return Appointment in 1 week. Wound #4 Left Wrist: Return Appointment in 1 week. Wound #5 Left Hand - 5th Digit: Return Appointment in 1 week. Wound #6 Left,Lateral Knee: Return Appointment in 1 week. Wound #7 Left Knee: Return Appointment in 1 week. Wound #8 Right Knee: Return Appointment in 1 week. I'm gonna recommend currently that we continue with the above wound care measures. Patient is in agreement the plan. If anything changes or worsens in the meantime he let me know. Please see above for specific wound care orders. We will see patient for re-evaluation in 1 week(s) here in the clinic. If anything worsens or changes patient will contact our office for additional recommendations. Electronic Signature(s) Signed: 09/11/2018 5:12:14 PM By: Adrian Blackwater, [place] ([id number]) Entered By: Lenda Kelp on 09/11/2018 13:59:10 OGDEN, HANDLIN ([id number]) -------------------------------------------------------------------------------- ROS/PFSH Details Patient Name: ANTAVION, BARTOSZEK. Date of Service: 09/11/2018 12:45 PM Medical Record Number: [id number] Patient Account Number: [id number] Date of Birth/Sex: 1931-01-21 (85 y.o. Male) Treating RN: Curtis Sites Primary Care Provider: Bethann Punches Other Clinician: Referring Provider: Bethann Punches Treating  Provider/Extender: Linwood Dibbles, HOYT Weeks in Treatment: 1 Information Obtained From Patient Wound History Do you currently have one or more open woundso Yes How many open wounds do you currently haveo 6 Approximately how long have you had your woundso 1 week How have you been treating your wound(s) until nowo non adherant dressing, compression Has your wound(s) ever healed and then re-openedo No Have you had any lab work done in the past montho No Have you tested positive for an antibiotic resistant organism (MRSA, VRE)o No Have you tested positive for osteomyelitis (bone infection)o No Have you had any tests for circulation on your legso No Constitutional Symptoms (General Health) Complaints and Symptoms: Negative for: Fever; Chills Eyes Medical History: Past Medical History Notes: macular degeneration Respiratory Complaints and Symptoms: No Complaints or Symptoms Medical History: Positive for: Asthma Cardiovascular Complaints and Symptoms: No Complaints or Symptoms Medical History: Positive for: Hypertension Past Medical History Notes: mitral valve disorder 2000 stent placed Endocrine Medical History: Positive for: Type II Diabetes - 20 years oral meds Time with diabetes: 20 years Treated with: Oral agents DICKY, BOER ([id number]) Blood sugar tested every day: No Integumentary (Skin) Medical History: Negative for: History of Burn; History of pressure wounds Neurologic Medical History: Past Medical History Notes: Stroke Psychiatric Complaints and Symptoms: No Complaints or Symptoms Immunizations Pneumococcal Vaccine: Received Pneumococcal Vaccination: Yes Implantable Devices Family and Social History Former smoker - cigar; Marital Status - Married; Alcohol Use: Never; Drug Use: No History; Caffeine Use: Daily; Financial Concerns: No; Food, Clothing or Shelter Needs: No; Support System Lacking: No; Transportation Concerns: No Physician Affirmation I have  reviewed and agree with the above information. Electronic Signature(s) Signed: 09/11/2018 4:56:33 PM By: Curtis Sites Signed: 09/11/2018 5:12:14 PM By: Lenda Kelp PA-C Entered By: Lenda Kelp on 09/11/2018  13:58:34 AHKING, PRETZER ([id number]) -------------------------------------------------------------------------------- SuperBill Details Patient Name: ERJON, KLUESNER. Date of Service: 09/11/2018 Medical Record Number: [id number] Patient Account Number: [id number] Date of Birth/Sex: 1931-01-22 (85 y.o. Male) Treating RN: Curtis Sites Primary Care Provider: Bethann Punches Other Clinician: Referring Provider: Bethann Punches Treating Provider/Extender: Linwood Dibbles, HOYT Weeks in Treatment: 1 Diagnosis Coding ICD-10 Codes Code Description S51.002A Unspecified open wound of left elbow, initial encounter S51.802A Unspecified open wound of left forearm, initial encounter S61.502A Unspecified open wound of left wrist, initial encounter S61.402A Unspecified open wound of left hand, initial encounter S81.002A Unspecified open wound, left knee, initial encounter S81.001A Unspecified open wound, right knee, initial encounter I10 Essential (primary) hypertension E11.622 Type 2 diabetes mellitus with other skin ulcer Facility Procedures CPT4 Code: [id number] Description: 11042 - DEB SUBQ TISSUE 20 SQ CM/< ICD-10 Diagnosis Description S61.502A Unspecified open wound of left wrist, initial encounter S81.002A Unspecified open wound, left knee, initial encounter Modifier: Quantity: 1 Physician Procedures CPT4 Code: [id number] Description: 11042 - WC PHYS SUBQ TISS 20 SQ CM ICD-10 Diagnosis Description S61.502A Unspecified open wound of left wrist, initial encounter S81.002A Unspecified open wound, left knee, initial encounter Modifier: Quantity: 1 Electronic Signature(s) Signed: 09/11/2018 5:12:14 PM By: Lenda Kelp PA-C Entered By: Lenda Kelp on 09/11/2018 13:59:40

## 2018-09-15 DIAGNOSIS — J4540 Moderate persistent asthma, uncomplicated: Secondary | ICD-10-CM | POA: Diagnosis not present

## 2018-09-15 DIAGNOSIS — J439 Emphysema, unspecified: Secondary | ICD-10-CM | POA: Diagnosis not present

## 2018-09-15 DIAGNOSIS — J471 Bronchiectasis with (acute) exacerbation: Secondary | ICD-10-CM | POA: Diagnosis not present

## 2018-09-18 ENCOUNTER — Encounter: Payer: Medicare Other | Admitting: Physician Assistant

## 2018-09-18 DIAGNOSIS — S51002A Unspecified open wound of left elbow, initial encounter: Secondary | ICD-10-CM | POA: Diagnosis not present

## 2018-09-18 DIAGNOSIS — Z8673 Personal history of transient ischemic attack (TIA), and cerebral infarction without residual deficits: Secondary | ICD-10-CM | POA: Diagnosis not present

## 2018-09-18 DIAGNOSIS — I10 Essential (primary) hypertension: Secondary | ICD-10-CM | POA: Diagnosis not present

## 2018-09-18 DIAGNOSIS — J45909 Unspecified asthma, uncomplicated: Secondary | ICD-10-CM | POA: Diagnosis not present

## 2018-09-18 DIAGNOSIS — S61502A Unspecified open wound of left wrist, initial encounter: Secondary | ICD-10-CM | POA: Diagnosis not present

## 2018-09-18 DIAGNOSIS — Z87891 Personal history of nicotine dependence: Secondary | ICD-10-CM | POA: Diagnosis not present

## 2018-09-18 DIAGNOSIS — L98492 Non-pressure chronic ulcer of skin of other sites with fat layer exposed: Secondary | ICD-10-CM | POA: Diagnosis not present

## 2018-09-18 DIAGNOSIS — S61402A Unspecified open wound of left hand, initial encounter: Secondary | ICD-10-CM | POA: Diagnosis not present

## 2018-09-18 DIAGNOSIS — E11622 Type 2 diabetes mellitus with other skin ulcer: Secondary | ICD-10-CM | POA: Diagnosis not present

## 2018-09-18 DIAGNOSIS — S81002A Unspecified open wound, left knee, initial encounter: Secondary | ICD-10-CM | POA: Diagnosis not present

## 2018-09-18 DIAGNOSIS — S81001A Unspecified open wound, right knee, initial encounter: Secondary | ICD-10-CM | POA: Diagnosis not present

## 2018-09-18 DIAGNOSIS — H3530 Unspecified macular degeneration: Secondary | ICD-10-CM | POA: Diagnosis not present

## 2018-09-18 DIAGNOSIS — S51802A Unspecified open wound of left forearm, initial encounter: Secondary | ICD-10-CM | POA: Diagnosis not present

## 2018-09-21 NOTE — Progress Notes (Signed)
George Hensley, George F. ([id number]) Visit Report for 09/18/2018 Chief Complaint Document Details Patient Name: George Hensley, George F. Date of Service: 09/18/2018 8:00 AM Medical Record Number: [id number] Patient Account Number: [id number] Date of Birth/Sex: 03-Aug-1931 (85 y.o. M) Treating RN: Arnette NorrisBiell, Kristina Primary Care Provider: Bethann PunchesMiller, Mark Other Clinician: Referring Provider: Bethann PunchesMiller, Mark Treating Provider/Extender: Linwood DibblesSTONE III, HOYT Weeks in Treatment: 2 Information Obtained from: Patient Chief Complaint Multiple Left UE and Bilateral LE Ulcers Electronic Signature(s) Signed: 09/20/2018 2:36:46 AM By: Lenda KelpStone III, Hoyt PA-C Entered By: Lenda KelpStone III, Hoyt on 09/18/2018 08:14:55 George Hensley, George F. ([id number]) -------------------------------------------------------------------------------- Debridement Details Patient Name: George Hensley, George F. Date of Service: 09/18/2018 8:00 AM Medical Record Number: [id number] Patient Account Number: [id number] Date of Birth/Sex: 03-Aug-1931 (85 y.o. M) Treating RN: Huel CoventryWoody, Kim Primary Care Provider: Bethann PunchesMiller, Mark Other Clinician: Referring Provider: Bethann PunchesMiller, Mark Treating Provider/Extender: Linwood DibblesSTONE III, HOYT Weeks in Treatment: 2 Debridement Performed for Wound #5 Left Hand - 5th Digit Assessment: Performed By: Physician STONE III, HOYT E., PA-C Debridement Type: Debridement Level of Consciousness (Pre- Awake and Alert procedure): Pre-procedure Verification/Time Yes - 08:34 Out Taken: Start Time: 08:34 Pain Control: Lidocaine Total Area Debrided (L x W): 0.1 (cm) x 0.5 (cm) = 0.05 (cm) Tissue and other material Non-Viable, Skin: Dermis , Skin: Epidermis debrided: Level: Skin/Epidermis Debridement Description: Selective/Open Wound Instrument: Scissors Bleeding: Minimum Hemostasis Achieved: Pressure End Time: 08:37 Response to Treatment: Procedure was tolerated well Level of Consciousness Awake and Alert (Post-procedure): Post Debridement Measurements of  Total Wound Length: (cm) 0.2 Width: (cm) 0.5 Depth: (cm) 0.1 Volume: (cm) 0.008 Character of Wound/Ulcer Post Debridement: Requires Further Debridement Post Procedure Diagnosis Same as Pre-procedure Electronic Signature(s) Signed: 09/18/2018 5:43:44 PM By: Elliot GurneyWoody, BSN, RN, CWS, Kim RN, BSN Signed: 09/20/2018 2:36:46 AM By: Lenda KelpStone III, Hoyt PA-C Entered By: Elliot GurneyWoody, BSN, RN, CWS, Kim on 09/18/2018 08:37:47 George Hensley, George F. ([id number]) -------------------------------------------------------------------------------- Debridement Details Patient Name: George Hensley, George F. Date of Service: 09/18/2018 8:00 AM Medical Record Number: [id number] Patient Account Number: [id number] Date of Birth/Sex: 03-Aug-1931 (85 y.o. M) Treating RN: Huel CoventryWoody, Kim Primary Care Provider: Bethann PunchesMiller, Mark Other Clinician: Referring Provider: Bethann PunchesMiller, Mark Treating Provider/Extender: Linwood DibblesSTONE III, HOYT Weeks in Treatment: 2 Debridement Performed for Wound #9 Left,Medial Wrist Assessment: Performed By: Physician STONE III, HOYT E., PA-C Debridement Type: Debridement Level of Consciousness (Pre- Awake and Alert procedure): Pre-procedure Verification/Time Yes - 08:34 Out Taken: Start Time: 08:34 Pain Control: Lidocaine Total Area Debrided (L x W): 0.2 (cm) x 0.6 (cm) = 0.12 (cm) Tissue and other material Non-Viable, Skin: Dermis , Skin: Epidermis debrided: Level: Skin/Epidermis Debridement Description: Selective/Open Wound Instrument: Scissors Bleeding: Minimum Hemostasis Achieved: Pressure End Time: 08:37 Response to Treatment: Procedure was tolerated well Level of Consciousness Awake and Alert (Post-procedure): Post Debridement Measurements of Total Wound Length: (cm) 0.2 Width: (cm) 0.6 Depth: (cm) 0.2 Volume: (cm) 0.019 Character of Wound/Ulcer Post Debridement: Requires Further Debridement Post Procedure Diagnosis Same as Pre-procedure Electronic Signature(s) Signed: 09/18/2018 5:43:44 PM By: Elliot GurneyWoody,  BSN, RN, CWS, Kim RN, BSN Signed: 09/20/2018 2:36:46 AM By: Lenda KelpStone III, Hoyt PA-C Entered By: Elliot GurneyWoody, BSN, RN, CWS, Kim on 09/18/2018 08:55:22 George Hensley, George F. ([id number]) -------------------------------------------------------------------------------- HPI Details Patient Name: George Hensley, George F. Date of Service: 09/18/2018 8:00 AM Medical Record Number: [id number] Patient Account Number: [id number] Date of Birth/Sex: 03-Aug-1931 (85 y.o. M) Treating RN: Huel CoventryWoody, Kim Primary Care Provider: Bethann PunchesMiller, Mark Other Clinician: Referring Provider: Bethann PunchesMiller, Mark Treating Provider/Extender: Linwood DibblesSTONE III, HOYT Weeks in Treatment: 2 History of Present Illness HPI Description: 09/04/18 on evaluation today  patient actually appears to be doing somewhat poorly upon initial evaluation in regard to his left upper extremity in his bilateral knees. He unfortunately sustained a fall while he was at the beach this past Saturday 08/30/18. Nonetheless he was seen in the emergency department initially and then subsequently was seen by his primary care provider Dr. Hyacinth Meeker who referred him to [place] for further evaluation and treatment. Subsequently he was given a tetanus shot during the course of all this and also was placed on Flex 250 mg three times a day for 10 days by Dr. Hyacinth Meeker. Currently the patient has several wounds in varying stages of stability over the left upper extremity and the bilateral knee regions. He does have a history of diabetes mellitus type II and hypertension. 09/11/18 on evaluation today patient appears to be doing much better in regard to his multiple upper and lower Trinity ulcer's. All are measuring smaller today which is excellent news. Overall he has been tolerating the dressing changes although unfortunately he has not received his dressings. They been using the Bactroban ointment along with nonadherent pads they purchased at the pharmacy. 09/18/18 on evaluation today patient appears to be doing rather well  at this point from the standpoint of his ulcers. Fortunately there does not appear to be any evidence of infection at this time which is good news. He seems to be showing signs of improvement both in regard to the upper and lower extremities currently. No fevers, chills, nausea, or vomiting noted at this time. Electronic Signature(s) Signed: 09/20/2018 2:36:46 AM By: Lenda Kelp PA-C Entered By: Lenda Kelp on 09/18/2018 08:53:41 George Hensley, George Hensley ([id number]) -------------------------------------------------------------------------------- Physical Exam Details Patient Name: George Hensley, HEIDER. Date of Service: 09/18/2018 8:00 AM Medical Record Number: [id number] Patient Account Number: [id number] Date of Birth/Sex: Mar 20, 1931 (85 y.o. M) Treating RN: Huel Coventry Primary Care Provider: Bethann Punches Other Clinician: Referring Provider: Bethann Punches Treating Provider/Extender: Linwood Dibbles, HOYT Weeks in Treatment: 2 Constitutional Well-nourished and well-hydrated in no acute distress. Respiratory normal breathing without difficulty. Psychiatric this patient is able to make decisions and demonstrates good insight into disease process. Alert and Oriented x 3. pleasant and cooperative. Notes Upon evaluation patient did require sharp debridement of the hand ulcer as well as the newly opened skin tear/ulcer along the medial portion of his wrist. Fortunately post debridement this actually appears to be doing much better which is great news. Overall I do feel like he's making good improvement at this time. Electronic Signature(s) Signed: 09/20/2018 2:36:46 AM By: Lenda Kelp PA-C Entered By: Lenda Kelp on 09/18/2018 08:54:32 George Paris ([id number]) -------------------------------------------------------------------------------- Physician Orders Details Patient Name: TANIA, PERROTT. Date of Service: 09/18/2018 8:00 AM Medical Record Number: [id number] Patient Account Number:  [id number] Date of Birth/Sex: July 18, 1931 (85 y.o. M) Treating RN: Huel Coventry Primary Care Provider: Bethann Punches Other Clinician: Referring Provider: Bethann Punches Treating Provider/Extender: Linwood Dibbles, HOYT Weeks in Treatment: 2 Verbal / Phone Orders: No Diagnosis Coding ICD-10 Coding Code Description S51.002A Unspecified open wound of left elbow, initial encounter S51.802A Unspecified open wound of left forearm, initial encounter S61.502A Unspecified open wound of left wrist, initial encounter S61.402A Unspecified open wound of left hand, initial encounter S81.002A Unspecified open wound, left knee, initial encounter S81.001A Unspecified open wound, right knee, initial encounter I10 Essential (primary) hypertension E11.622 Type 2 diabetes mellitus with other skin ulcer Wound Cleansing Wound #1 Left Elbow o Clean wound with Normal Saline. o May Shower, gently pat wound dry  prior to applying new dressing. - Please use Dial antibacterial soap to wash your wounds Wound #2 Left,Proximal Forearm o Clean wound with Normal Saline. o May Shower, gently pat wound dry prior to applying new dressing. - Please use Dial antibacterial soap to wash your wounds Wound #3 Left,Distal Forearm o Clean wound with Normal Saline. o May Shower, gently pat wound dry prior to applying new dressing. - Please use Dial antibacterial soap to wash your wounds Wound #4 Left,Lateral Wrist o Clean wound with Normal Saline. o May Shower, gently pat wound dry prior to applying new dressing. - Please use Dial antibacterial soap to wash your wounds Wound #5 Left Hand - 5th Digit o Clean wound with Normal Saline. o May Shower, gently pat wound dry prior to applying new dressing. - Please use Dial antibacterial soap to wash your wounds Wound #6 Left,Lateral Knee o Clean wound with Normal Saline. o May Shower, gently pat wound dry prior to applying new dressing. - Please use Dial  antibacterial soap to wash your wounds Wound #7 Left Knee KIMBALL, APPLEBY ([id number]) o Clean wound with Normal Saline. o May Shower, gently pat wound dry prior to applying new dressing. - Please use Dial antibacterial soap to wash your wounds Wound #8 Right Knee o Clean wound with Normal Saline. o May Shower, gently pat wound dry prior to applying new dressing. - Please use Dial antibacterial soap to wash your wounds Wound #9 Left,Medial Wrist o Clean wound with Normal Saline. o May Shower, gently pat wound dry prior to applying new dressing. - Please use Dial antibacterial soap to wash your wounds Anesthetic (add to Medication List) Wound #1 Left Elbow o Topical Lidocaine 4% cream applied to wound bed prior to debridement (In Clinic Only). Wound #2 Left,Proximal Forearm o Topical Lidocaine 4% cream applied to wound bed prior to debridement (In Clinic Only). Wound #3 Left,Distal Forearm o Topical Lidocaine 4% cream applied to wound bed prior to debridement (In Clinic Only). Wound #4 Left,Lateral Wrist o Topical Lidocaine 4% cream applied to wound bed prior to debridement (In Clinic Only). Wound #5 Left Hand - 5th Digit o Topical Lidocaine 4% cream applied to wound bed prior to debridement (In Clinic Only). Wound #6 Left,Lateral Knee o Topical Lidocaine 4% cream applied to wound bed prior to debridement (In Clinic Only). Wound #7 Left Knee o Topical Lidocaine 4% cream applied to wound bed prior to debridement (In Clinic Only). Wound #8 Right Knee o Topical Lidocaine 4% cream applied to wound bed prior to debridement (In Clinic Only). Wound #9 Left,Medial Wrist o Topical Lidocaine 4% cream applied to wound bed prior to debridement (In Clinic Only). Primary Wound Dressing Wound #1 Left Elbow o Xeroform Wound #2 Left,Proximal Forearm o Xeroform Wound #3 Left,Distal Forearm o Xeroform Wound #4 Left,Lateral Wrist o Xeroform Wound #5 Left  Hand - 5th Digit o Xeroform DEAVION, DOBBS ([id number]) Wound #6 Left,Lateral Knee o Other: - Bacitracin ointment Wound #7 Left Knee o Other: - Bacitracin ointment Wound #8 Right Knee o Other: - Bacitracin ointment Wound #9 Left,Medial Wrist o Xeroform Secondary Dressing Wound #6 Left,Lateral Knee o Other - Cover bandage Wound #7 Left Knee o Other - Cover bandage Wound #8 Right Knee o Other - Cover bandage Wound #1 Left Elbow o Conform/Kerlix - Stretch net Wound #2 Left,Proximal Forearm o Conform/Kerlix - Stretch net Wound #3 Left,Distal Forearm o Conform/Kerlix - Stretch net Wound #4 Left,Lateral Wrist o Conform/Kerlix - Stretch net Wound #5 Left  Hand - 5th Digit o Conform/Kerlix - Stretch net Wound #9 Left,Medial Wrist o Conform/Kerlix - Stretch net Dressing Change Frequency Wound #1 Left Elbow o Change dressing every other day. Wound #2 Left,Proximal Forearm o Change dressing every other day. Wound #3 Left,Distal Forearm o Change dressing every other day. Wound #4 Left,Lateral Wrist o Change dressing every other day. Wound #5 Left Hand - 5th Digit o Change dressing every other day. CLABORN, JANUSZ F. ([id number]) Wound #6 Left,Lateral Knee o Change dressing every other day. Wound #7 Left Knee o Change dressing every other day. Wound #8 Right Knee o Change dressing every other day. Wound #9 Left,Medial Wrist o Change dressing every other day. Follow-up Appointments Wound #1 Left Elbow o Return Appointment in 1 week. Wound #2 Left,Proximal Forearm o Return Appointment in 1 week. Wound #3 Left,Distal Forearm o Return Appointment in 1 week. Wound #4 Left,Lateral Wrist o Return Appointment in 1 week. Wound #5 Left Hand - 5th Digit o Return Appointment in 1 week. Wound #6 Left,Lateral Knee o Return Appointment in 1 week. Wound #7 Left Knee o Return Appointment in 1 week. Wound #8 Right Knee o  Return Appointment in 1 week. Wound #9 Left,Medial Wrist o Return Appointment in 1 week. Electronic Signature(s) Signed: 09/18/2018 5:43:44 PM By: Elliot Gurney, BSN, RN, CWS, Kim RN, BSN Signed: 09/20/2018 2:36:46 AM By: Lenda Kelp PA-C Entered By: Elliot Gurney BSN, RN, CWS, Kim on 09/18/2018 08:42:33 DENY, CHEVEZ ([id number]) -------------------------------------------------------------------------------- Problem List Details Patient Name: NAZAIAH, NAVARRETE. Date of Service: 09/18/2018 8:00 AM Medical Record Number: [id number] Patient Account Number: [id number] Date of Birth/Sex: 1931/01/19 (85 y.o. M) Treating RN: Arnette Norris Primary Care Provider: Bethann Punches Other Clinician: Referring Provider: Bethann Punches Treating Provider/Extender: Linwood Dibbles, HOYT Weeks in Treatment: 2 Active Problems ICD-10 Evaluated Encounter Code Description Active Date Today Diagnosis S51.002A Unspecified open wound of left elbow, initial encounter 09/04/2018 No Yes S51.802A Unspecified open wound of left forearm, initial encounter 09/04/2018 No Yes S61.502A Unspecified open wound of left wrist, initial encounter 09/04/2018 No Yes S61.402A Unspecified open wound of left hand, initial encounter 09/04/2018 No Yes S81.002A Unspecified open wound, left knee, initial encounter 09/04/2018 No Yes S81.001A Unspecified open wound, right knee, initial encounter 09/04/2018 No Yes I10 Essential (primary) hypertension 09/04/2018 No Yes E11.622 Type 2 diabetes mellitus with other skin ulcer 09/04/2018 No Yes Inactive Problems Resolved Problems Electronic Signature(s) Signed: 09/20/2018 2:36:46 AM By: Lenda Kelp PA-C Entered By: Lenda Kelp on 09/18/2018 08:14:18 LEODIS, ALCOCER ([id number], LAGRAND ([id number]) -------------------------------------------------------------------------------- Progress Note Details Patient Name: George Paris. Date of Service: 09/18/2018 8:00 AM Medical Record Number:  [id number] Patient Account Number: [id number] Date of Birth/Sex: [birth year] (85 y.o. M) Treating RN: Huel Coventry Primary Care Provider: Bethann Punches Other Clinician: Referring Provider: Bethann Punches Treating Provider/Extender: Linwood Dibbles, HOYT Weeks in Treatment: 2 Subjective Chief Complaint Information obtained from Patient Multiple Left UE and Bilateral LE Ulcers History of Present Illness (HPI) 09/04/18 on evaluation today patient actually appears to be doing somewhat poorly upon initial evaluation in regard to his left upper extremity in his bilateral knees. He unfortunately sustained a fall while he was at the beach this past Saturday 08/30/18. Nonetheless he was seen in the emergency department initially and then subsequently was seen by his primary care provider Dr. Hyacinth Meeker who referred him to [place] for further evaluation and treatment. Subsequently he was given a tetanus shot during the course of all this and also was placed on  Flex 250 mg three times a day for 10 days by Dr. Hyacinth Meeker. Currently the patient has several wounds in varying stages of stability over the left upper extremity and the bilateral knee regions. He does have a history of diabetes mellitus type II and hypertension. 09/11/18 on evaluation today patient appears to be doing much better in regard to his multiple upper and lower Trinity ulcer's. All are measuring smaller today which is excellent news. Overall he has been tolerating the dressing changes although unfortunately he has not received his dressings. They been using the Bactroban ointment along with nonadherent pads they purchased at the pharmacy. 09/18/18 on evaluation today patient appears to be doing rather well at this point from the standpoint of his ulcers. Fortunately there does not appear to be any evidence of infection at this time which is good news. He seems to be showing signs of improvement both in regard to the upper and lower extremities currently. No fevers,  chills, nausea, or vomiting noted at this time. Patient History Information obtained from Patient. Social History Former smoker - cigar, Marital Status - Married, Alcohol Use - Never, Drug Use - No History, Caffeine Use - Daily. Medical And Surgical History Notes Eyes macular degeneration Cardiovascular mitral valve disorder 2000 stent placed Neurologic Stroke Review of Systems (ROS) Constitutional Symptoms (General Health) Denies complaints or symptoms of Fever, Chills. Respiratory The patient has no complaints or symptoms. Cardiovascular The patient has no complaints or symptoms. Psychiatric SHAUNE, WESTFALL ([id number]) The patient has no complaints or symptoms. Objective Constitutional Well-nourished and well-hydrated in no acute distress. Vitals Time Taken: 8:06 AM, Height: 70 in, Weight: 160 lbs, BMI: 23, Temperature: 97.6 F, Pulse: 72 bpm, Respiratory Rate: 18 breaths/min, Blood Pressure: 136/66 mmHg. Respiratory normal breathing without difficulty. Psychiatric this patient is able to make decisions and demonstrates good insight into disease process. Alert and Oriented x 3. pleasant and cooperative. General Notes: Upon evaluation patient did require sharp debridement of the hand ulcer as well as the newly opened skin tear/ulcer along the medial portion of his wrist. Fortunately post debridement this actually appears to be doing much better which is great news. Overall I do feel like he's making good improvement at this time. Integumentary (Hair, Skin) Wound #1 status is Open. Original cause of wound was Trauma. The wound is located on the Left Elbow. The wound measures 0.1cm length x 0.1cm width x 0.1cm depth; 0.008cm^2 area and 0.001cm^3 volume. The wound is limited to skin breakdown. There is no tunneling or undermining noted. There is a none present amount of drainage noted. The wound margin is flat and intact. There is no granulation within the wound bed. There is  no necrotic tissue within the wound bed. The periwound skin appearance exhibited: Hemosiderin Staining. Periwound temperature was noted as No Abnormality. The periwound has tenderness on palpation. Wound #2 status is Open. Original cause of wound was Trauma. The wound is located on the Left,Proximal Forearm. The wound measures 0.1cm length x 0.1cm width x 0.1cm depth; 0.008cm^2 area and 0.001cm^3 volume. The wound is limited to skin breakdown. There is no tunneling or undermining noted. There is a none present amount of drainage noted. The wound margin is flat and intact. There is no granulation within the wound bed. There is no necrotic tissue within the wound bed. The periwound skin appearance exhibited: Hemosiderin Staining. The periwound skin appearance did not exhibit: Callus, Crepitus, Excoriation, Induration, Rash, Scarring, Dry/Scaly, Maceration, Atrophie Blanche, Cyanosis, Ecchymosis, Mottled, Pallor, Rubor, Erythema.  Periwound temperature was noted as No Abnormality. The periwound has tenderness on palpation. Wound #3 status is Open. Original cause of wound was Trauma. The wound is located on the Left,Distal Forearm. The wound measures 1cm length x 4.2cm width x 0.1cm depth; 3.299cm^2 area and 0.33cm^3 volume. There is Fat Layer (Subcutaneous Tissue) Exposed exposed. There is no tunneling or undermining noted. There is a medium amount of serosanguineous drainage noted. The wound margin is flat and intact. There is small (1-33%) pink granulation within the wound bed. There is a large (67-100%) amount of necrotic tissue within the wound bed including Eschar and Adherent Slough. The periwound skin appearance exhibited: Hemosiderin Staining. Periwound temperature was noted as No Abnormality. The periwound has tenderness on palpation. Wound #4 status is Open. Original cause of wound was Trauma. The wound is located on the Left,Lateral Wrist. The wound measures 0.8cm length x 0.7cm width x  0.1cm depth; 0.44cm^2 area and 0.044cm^3 volume. There is Fat Layer (Subcutaneous Tissue) Exposed exposed. There is no tunneling or undermining noted. There is a medium amount of serosanguineous drainage noted. The wound margin is flat and intact. There is no granulation within the wound bed. There is George Hensley, Tykeem F. ([id number]) a large (67-100%) amount of necrotic tissue within the wound bed including Adherent Slough. The periwound skin appearance exhibited: Hemosiderin Staining. The periwound skin appearance did not exhibit: Callus, Crepitus, Excoriation, Induration, Rash, Scarring, Dry/Scaly, Maceration, Atrophie Blanche, Cyanosis, Ecchymosis, Mottled, Pallor, Rubor, Erythema. Periwound temperature was noted as No Abnormality. The periwound has tenderness on palpation. Wound #5 status is Open. Original cause of wound was Trauma. The wound is located on the Left Hand - 5th Digit. The wound measures 0.2cm length x 0.5cm width x 0.1cm depth; 0.079cm^2 area and 0.008cm^3 volume. There is Fat Layer (Subcutaneous Tissue) Exposed exposed. There is no tunneling or undermining noted. There is a medium amount of serosanguineous drainage noted. The wound margin is flat and intact. There is small (1-33%) pink granulation within the wound bed. There is a large (67-100%) amount of necrotic tissue within the wound bed including Eschar and Adherent Slough. The periwound skin appearance exhibited: Hemosiderin Staining. The periwound skin appearance did not exhibit: Callus, Crepitus, Excoriation, Induration, Rash, Scarring, Dry/Scaly, Maceration, Atrophie Blanche, Cyanosis, Ecchymosis, Mottled, Pallor, Rubor, Erythema. Periwound temperature was noted as No Abnormality. The periwound has tenderness on palpation. Wound #6 status is Open. Original cause of wound was Trauma. The wound is located on the Left,Lateral Knee. The wound measures 2cm length x 0.3cm width x 0.1cm depth; 0.471cm^2 area and 0.047cm^3 volume.  The wound is limited to skin breakdown. There is no undermining noted. There is a none present amount of drainage noted. The wound margin is flat and intact. There is no granulation within the wound bed. There is a large (67-100%) amount of necrotic tissue within the wound bed including Eschar. The periwound skin appearance exhibited: Hemosiderin Staining. The periwound skin appearance did not exhibit: Callus, Crepitus, Excoriation, Induration, Rash, Scarring, Dry/Scaly, Maceration, Atrophie Blanche, Cyanosis, Ecchymosis, Mottled, Pallor, Rubor, Erythema. Periwound temperature was noted as No Abnormality. The periwound has tenderness on palpation. Wound #7 status is Open. Original cause of wound was Trauma. The wound is located on the Left Knee. The wound measures 1.8cm length x 2cm width x 0.1cm depth; 2.827cm^2 area and 0.283cm^3 volume. There is Fat Layer (Subcutaneous Tissue) Exposed exposed. There is no tunneling or undermining noted. There is a none present amount of drainage noted. The wound margin is flat and  intact. There is no granulation within the wound bed. There is a large (67-100%) amount of necrotic tissue within the wound bed including Eschar and Adherent Slough. The periwound skin appearance exhibited: Erythema. The periwound skin appearance did not exhibit: Callus, Crepitus, Excoriation, Induration, Rash, Scarring, Dry/Scaly, Maceration, Atrophie Blanche, Cyanosis, Ecchymosis, Hemosiderin Staining, Mottled, Pallor, Rubor. The surrounding wound skin color is noted with erythema which is circumferential. Periwound temperature was noted as No Abnormality. The periwound has tenderness on palpation. Wound #8 status is Open. Original cause of wound was Trauma. The wound is located on the Right Knee. The wound measures 0.5cm length x 0.7cm width x 0.1cm depth; 0.275cm^2 area and 0.027cm^3 volume. There is Fat Layer (Subcutaneous Tissue) Exposed exposed. There is no tunneling or  undermining noted. There is a medium amount of serosanguineous drainage noted. The wound margin is flat and intact. There is small (1-33%) pink granulation within the wound bed. There is a large (67-100%) amount of necrotic tissue within the wound bed including Eschar and Adherent Slough. The periwound skin appearance did not exhibit: Callus, Crepitus, Excoriation, Induration, Rash, Scarring, Dry/Scaly, Maceration, Atrophie Blanche, Cyanosis, Ecchymosis, Hemosiderin Staining, Mottled, Pallor, Rubor, Erythema. Periwound temperature was noted as No Abnormality. The periwound has tenderness on palpation. Wound #9 status is Open. Original cause of wound was Trauma. The wound is located on the Left,Medial Wrist. The wound measures 0.2cm length x 0.6cm width x 0.1cm depth; 0.094cm^2 area and 0.009cm^3 volume. There is Fat Layer (Subcutaneous Tissue) Exposed exposed. There is no tunneling or undermining noted. There is a medium amount of serous drainage noted. The wound margin is flat and intact. There is large (67-100%) red granulation within the wound bed. There is no necrotic tissue within the wound bed. The periwound skin appearance did not exhibit: Callus, Crepitus, Excoriation, Induration, Rash, Scarring, Dry/Scaly, Maceration, Atrophie Blanche, Cyanosis, Ecchymosis, Hemosiderin Staining, Mottled, Pallor, Rubor, Erythema. Assessment Active Problems ICD-10 TAVIN, ENDSLEY ([id number]) Unspecified open wound of left elbow, initial encounter Unspecified open wound of left forearm, initial encounter Unspecified open wound of left wrist, initial encounter Unspecified open wound of left hand, initial encounter Unspecified open wound, left knee, initial encounter Unspecified open wound, right knee, initial encounter Essential (primary) hypertension Type 2 diabetes mellitus with other skin ulcer Procedures Wound #5 Pre-procedure diagnosis of Wound #5 is a Trauma, Other located on the Left Hand -  5th Digit . There was a Selective/Open Wound Skin/Epidermis Debridement with a total area of 0.05 sq cm performed by STONE III, HOYT E., PA-C. With the following instrument(s): Scissors to remove Non-Viable tissue/material. Material removed includes Skin: Dermis and Skin: Epidermis and after achieving pain control using Lidocaine. No specimens were taken. A time out was conducted at 08:34, prior to the start of the procedure. A Minimum amount of bleeding was controlled with Pressure. The procedure was tolerated well. Post Debridement Measurements: 0.2cm length x 0.5cm width x 0.1cm depth; 0.008cm^3 volume. Character of Wound/Ulcer Post Debridement requires further debridement. Post procedure Diagnosis Wound #5: Same as Pre-Procedure Plan Wound Cleansing: Wound #1 Left Elbow: Clean wound with Normal Saline. May Shower, gently pat wound dry prior to applying new dressing. - Please use Dial antibacterial soap to wash your wounds Wound #2 Left,Proximal Forearm: Clean wound with Normal Saline. May Shower, gently pat wound dry prior to applying new dressing. - Please use Dial antibacterial soap to wash your wounds Wound #3 Left,Distal Forearm: Clean wound with Normal Saline. May Shower, gently pat wound dry prior to applying  new dressing. - Please use Dial antibacterial soap to wash your wounds Wound #4 Left,Lateral Wrist: Clean wound with Normal Saline. May Shower, gently pat wound dry prior to applying new dressing. - Please use Dial antibacterial soap to wash your wounds Wound #5 Left Hand - 5th Digit: Clean wound with Normal Saline. May Shower, gently pat wound dry prior to applying new dressing. - Please use Dial antibacterial soap to wash your wounds Wound #6 Left,Lateral Knee: Clean wound with Normal Saline. May Shower, gently pat wound dry prior to applying new dressing. - Please use Dial antibacterial soap to wash your wounds Wound #7 Left Knee: Clean wound with Normal Saline. May  Shower, gently pat wound dry prior to applying new dressing. - Please use Dial antibacterial soap to wash your wounds Wound #8 Right Knee: Clean wound with Normal Saline. May Shower, gently pat wound dry prior to applying new dressing. - Please use Dial antibacterial soap to wash your wounds JONNATAN, HANNERS ([id number]) Wound #9 Left,Medial Wrist: Clean wound with Normal Saline. May Shower, gently pat wound dry prior to applying new dressing. - Please use Dial antibacterial soap to wash your wounds Anesthetic (add to Medication List): Wound #1 Left Elbow: Topical Lidocaine 4% cream applied to wound bed prior to debridement (In Clinic Only). Wound #2 Left,Proximal Forearm: Topical Lidocaine 4% cream applied to wound bed prior to debridement (In Clinic Only). Wound #3 Left,Distal Forearm: Topical Lidocaine 4% cream applied to wound bed prior to debridement (In Clinic Only). Wound #4 Left,Lateral Wrist: Topical Lidocaine 4% cream applied to wound bed prior to debridement (In Clinic Only). Wound #5 Left Hand - 5th Digit: Topical Lidocaine 4% cream applied to wound bed prior to debridement (In Clinic Only). Wound #6 Left,Lateral Knee: Topical Lidocaine 4% cream applied to wound bed prior to debridement (In Clinic Only). Wound #7 Left Knee: Topical Lidocaine 4% cream applied to wound bed prior to debridement (In Clinic Only). Wound #8 Right Knee: Topical Lidocaine 4% cream applied to wound bed prior to debridement (In Clinic Only). Wound #9 Left,Medial Wrist: Topical Lidocaine 4% cream applied to wound bed prior to debridement (In Clinic Only). Primary Wound Dressing: Wound #1 Left Elbow: Xeroform Wound #2 Left,Proximal Forearm: Xeroform Wound #3 Left,Distal Forearm: Xeroform Wound #4 Left,Lateral Wrist: Xeroform Wound #5 Left Hand - 5th Digit: Xeroform Wound #6 Left,Lateral Knee: Other: - Bacitracin ointment Wound #7 Left Knee: Other: - Bacitracin ointment Wound #8 Right  Knee: Other: - Bacitracin ointment Wound #9 Left,Medial Wrist: Xeroform Secondary Dressing: Wound #6 Left,Lateral Knee: Other - Cover bandage Wound #7 Left Knee: Other - Cover bandage Wound #8 Right Knee: Other - Cover bandage Wound #1 Left Elbow: Conform/Kerlix - Stretch net Wound #2 Left,Proximal Forearm: Conform/Kerlix - Stretch net Wound #3 Left,Distal Forearm: Conform/Kerlix - Stretch net Wound #4 Left,Lateral Wrist: Conform/Kerlix - Stretch net Wound #5 Left Hand - 5th Digit: Conform/Kerlix - Stretch net Wound #9 Left,Medial Wrist: Conform/Kerlix - Stretch net TERIK, HAUGHEY ([id number]) Dressing Change Frequency: Wound #1 Left Elbow: Change dressing every other day. Wound #2 Left,Proximal Forearm: Change dressing every other day. Wound #3 Left,Distal Forearm: Change dressing every other day. Wound #4 Left,Lateral Wrist: Change dressing every other day. Wound #5 Left Hand - 5th Digit: Change dressing every other day. Wound #6 Left,Lateral Knee: Change dressing every other day. Wound #7 Left Knee: Change dressing every other day. Wound #8 Right Knee: Change dressing every other day. Wound #9 Left,Medial Wrist: Change dressing every other day. Follow-up  Appointments: Wound #1 Left Elbow: Return Appointment in 1 week. Wound #2 Left,Proximal Forearm: Return Appointment in 1 week. Wound #3 Left,Distal Forearm: Return Appointment in 1 week. Wound #4 Left,Lateral Wrist: Return Appointment in 1 week. Wound #5 Left Hand - 5th Digit: Return Appointment in 1 week. Wound #6 Left,Lateral Knee: Return Appointment in 1 week. Wound #7 Left Knee: Return Appointment in 1 week. Wound #8 Right Knee: Return Appointment in 1 week. Wound #9 Left,Medial Wrist: Return Appointment in 1 week. I'm a recommend that we continue with the above wound care measures for the next week. The patient is in agreement with plan. We will subsequently see were things stand at follow-up  in one weeks time. If anything changes or worsens he will contact the office and let me know. Please see above for specific wound care orders. We will see patient for re-evaluation in 1 week(s) here in the clinic. If anything worsens or changes patient will contact our office for additional recommendations. Electronic Signature(s) Signed: 09/20/2018 2:36:46 AM By: Lenda Kelp PA-C Entered By: Lenda Kelp on 09/18/2018 08:55:05 George Paris ([id number]) -------------------------------------------------------------------------------- ROS/PFSH Details Patient Name: ARREN, LAMINACK. Date of Service: 09/18/2018 8:00 AM Medical Record Number: [id number] Patient Account Number: [id number] Date of Birth/Sex: 1931-05-26 (85 y.o. M) Treating RN: Huel Coventry Primary Care Provider: Bethann Punches Other Clinician: Referring Provider: Bethann Punches Treating Provider/Extender: Linwood Dibbles, HOYT Weeks in Treatment: 2 Information Obtained From Patient Wound History Do you currently have one or more open woundso Yes How many open wounds do you currently haveo 6 Approximately how long have you had your woundso 1 week How have you been treating your wound(s) until nowo non adherant dressing, compression Has your wound(s) ever healed and then re-openedo No Have you had any lab work done in the past montho No Have you tested positive for an antibiotic resistant organism (MRSA, VRE)o No Have you tested positive for osteomyelitis (bone infection)o No Have you had any tests for circulation on your legso No Constitutional Symptoms (General Health) Complaints and Symptoms: Negative for: Fever; Chills Eyes Medical History: Past Medical History Notes: macular degeneration Respiratory Complaints and Symptoms: No Complaints or Symptoms Medical History: Positive for: Asthma Cardiovascular Complaints and Symptoms: No Complaints or Symptoms Medical History: Positive for: Hypertension Past Medical  History Notes: mitral valve disorder 2000 stent placed Endocrine Medical History: Positive for: Type II Diabetes - 20 years oral meds Time with diabetes: 20 years Treated with: Oral agents MARVION, BASTIDAS ([id number]) Blood sugar tested every day: No Integumentary (Skin) Medical History: Negative for: History of Burn; History of pressure wounds Neurologic Medical History: Past Medical History Notes: Stroke Psychiatric Complaints and Symptoms: No Complaints or Symptoms Immunizations Pneumococcal Vaccine: Received Pneumococcal Vaccination: Yes Implantable Devices Family and Social History Former smoker - cigar; Marital Status - Married; Alcohol Use: Never; Drug Use: No History; Caffeine Use: Daily; Financial Concerns: No; Food, Clothing or Shelter Needs: No; Support System Lacking: No; Transportation Concerns: No Physician Affirmation I have reviewed and agree with the above information. Electronic Signature(s) Signed: 09/18/2018 5:43:44 PM By: Elliot Gurney, BSN, RN, CWS, Kim RN, BSN Signed: 09/20/2018 2:36:46 AM By: Lenda Kelp PA-C Entered By: Lenda Kelp on 09/18/2018 08:54:19 CHARISTOPHER, RUMBLE ([id number]) -------------------------------------------------------------------------------- SuperBill Details Patient Name: KAMARION, ZAGAMI. Date of Service: 09/18/2018 Medical Record Number: [id number] Patient Account Number: [id number] Date of Birth/Sex: 1931/04/17 (86 y.o. M) Treating RN: Huel Coventry Primary Care Provider: Bethann Punches Other Clinician: Referring Provider: Bethann Punches  Treating Provider/Extender: Linwood Dibbles, HOYT Weeks in Treatment: 2 Diagnosis Coding ICD-10 Codes Code Description S51.002A Unspecified open wound of left elbow, initial encounter S51.802A Unspecified open wound of left forearm, initial encounter S61.502A Unspecified open wound of left wrist, initial encounter S61.402A Unspecified open wound of left hand, initial encounter S81.002A Unspecified  open wound, left knee, initial encounter S81.001A Unspecified open wound, right knee, initial encounter I10 Essential (primary) hypertension E11.622 Type 2 diabetes mellitus with other skin ulcer Facility Procedures CPT4 Code: 16109604 Description: [phone] - DEBRIDE WOUND 1ST 20 SQ CM OR < ICD-10 Diagnosis Description S61.502A Unspecified open wound of left wrist, initial encounter Modifier: Quantity: 1 Physician Procedures CPT4 Code: 1191478 Description: [id number] - WC PHYS DEBR WO ANESTH 20 SQ CM ICD-10 Diagnosis Description S61.502A Unspecified open wound of left wrist, initial encounter Modifier: Quantity: 1 Electronic Signature(s) Signed: 09/20/2018 2:36:46 AM By: Lenda Kelp PA-C Entered By: Lenda Kelp on 09/18/2018 08:55:22

## 2018-09-22 DIAGNOSIS — H35361 Drusen (degenerative) of macula, right eye: Secondary | ICD-10-CM | POA: Diagnosis not present

## 2018-09-24 NOTE — Progress Notes (Signed)
George Hensley, George F. ([id number]) Visit Report for 09/18/2018 Arrival Information Details Patient Name: George Hensley, George F. Date of Service: 09/18/2018 8:00 AM Medical Record Number: [id number] Patient Account Number: [id number] Date of Birth/Sex: [birth year] (85 y.o. M) Treating RN: Arnette NorrisBiell, Kristina Primary Care Sheneika Walstad: Bethann PunchesMiller, Mark Other Clinician: Referring Keyleigh Manninen: Bethann PunchesMiller, Mark Treating Brailee Riede/Extender: Linwood DibblesSTONE III, HOYT Weeks in Treatment: 2 Visit Information History Since Last Visit Added or deleted any medications: No Patient Arrived: Ambulatory Any new allergies or adverse reactions: No Arrival Time: 08:05 Had a fall or experienced change in No Accompanied By: self activities of daily living that may affect Transfer Assistance: None risk of falls: Patient Identification Verified: Yes Signs or symptoms of abuse/neglect since last visito No Secondary Verification Process Completed: Yes Hospitalized since last visit: No Implantable device outside of the clinic excluding No cellular tissue based products placed in the center since last visit: Has Dressing in Place as Prescribed: Yes Pain Present Now: No Electronic Signature(s) Signed: 09/18/2018 11:33:04 AM By: Dayton MartesWallace, RCP,RRT,CHT, Sallie RCP, RRT, CHT Entered By: Dayton MartesWallace, RCP,RRT,CHT, Sallie on 09/18/2018 08:06:13 George Hensley, George F. ([id number]) -------------------------------------------------------------------------------- Lower Extremity Assessment Details Patient Name: George Hensley, George F. Date of Service: 09/18/2018 8:00 AM Medical Record Number: [id number] Patient Account Number: [id number] Date of Birth/Sex: [birth year] (85 y.o. M) Treating RN: Curtis Sitesorthy, Joanna Primary Care Luva Metzger: Bethann PunchesMiller, Mark Other Clinician: Referring Earnest Thalman: Bethann PunchesMiller, Mark Treating Jahara Dail/Extender: Linwood DibblesSTONE III, HOYT Weeks in Treatment: 2 Vascular Assessment Pulses: Dorsalis Pedis Palpable: [Left:Yes] [Right:Yes] Posterior Tibial Extremity colors,  hair growth, and conditions: Extremity Color: [Left:Normal] [Right:Normal] Hair Growth on Extremity: [Left:Yes] [Right:Yes] Temperature of Extremity: [Left:Warm] [Right:Warm] Capillary Refill: [Left:< 3 seconds] [Right:< 3 seconds] Toe Nail Assessment Left: Right: Thick: No No Discolored: No No Deformed: No No Improper Length and Hygiene: No No Electronic Signature(s) Signed: 09/18/2018 5:09:04 PM By: Curtis Sitesorthy, Joanna Entered By: Curtis Sitesorthy, Joanna on 09/18/2018 08:24:36 George Hensley, George F. ([id number]) -------------------------------------------------------------------------------- Multi Wound Chart Details Patient Name: George Hensley, George F. Date of Service: 09/18/2018 8:00 AM Medical Record Number: [id number] Patient Account Number: [id number] Date of Birth/Sex: [birth year] (85 y.o. M) Treating RN: Huel CoventryWoody, Kim Primary Care Denene Alamillo: Bethann PunchesMiller, Mark Other Clinician: Referring Necola Bluestein: Bethann PunchesMiller, Mark Treating Nyaja Dubuque/Extender: Linwood DibblesSTONE III, HOYT Weeks in Treatment: 2 Vital Signs Height(in): 70 Pulse(bpm): 72 Weight(lbs): 160 Blood Pressure(mmHg): 136/66 Body Mass Index(BMI): 23 Temperature(F): 97.6 Respiratory Rate 18 (breaths/min): Photos: [1:No Photos] [2:No Photos] [3:No Photos] Wound Location: [1:Left Elbow] [2:Left Forearm - Proximal] [3:Left Forearm - Distal] Wounding Event: [1:Trauma] [2:Trauma] [3:Trauma] Primary Etiology: [1:Trauma, Other] [2:Trauma, Other] [3:Trauma, Other] Comorbid History: [1:Asthma, Hypertension, Type II Diabetes] [2:Asthma, Hypertension, Type II Diabetes] [3:Asthma, Hypertension, Type II Diabetes] Date Acquired: [1:08/30/2018] [2:08/30/2018] [3:08/30/2018] Weeks of Treatment: [1:2] [2:2] [3:2] Wound Status: [1:Open] [2:Open] [3:Open] Clustered Wound: [1:No] [2:No] [3:No] Clustered Quantity: [1:N/A] [2:N/A] [3:N/A] Measurements L x W x D [1:0.1x0.1x0.1] [2:0.1x0.1x0.1] [3:1x4.2x0.1] (cm) Area (cm) : [1:0.008] [2:0.008] [3:3.299] Volume (cm) : [1:0.001]  [2:0.001] [3:0.33] % Reduction in Area: [1:99.90%] [2:99.80%] [3:60.00%] % Reduction in Volume: [1:99.90%] [2:99.70%] [3:60.00%] Classification: [1:Full Thickness Without Exposed Support Structures] [2:Full Thickness Without Exposed Support Structures] [3:Full Thickness Without Exposed Support Structures] Exudate Amount: [1:None Present] [2:None Present] [3:Medium] Exudate Type: [1:N/A] [2:N/A] [3:Serosanguineous] Exudate Color: [1:N/A] [2:N/A] [3:red, brown] Wound Margin: [1:Flat and Intact] [2:Flat and Intact] [3:Flat and Intact] Granulation Amount: [1:None Present (0%)] [2:None Present (0%)] [3:Small (1-33%)] Granulation Quality: [1:N/A] [2:N/A] [3:Pink] Necrotic Amount: [1:None Present (0%)] [2:None Present (0%)] [3:Large (67-100%)] Necrotic Tissue: [1:N/A] [2:N/A] [3:Eschar, Adherent Slough] Exposed Structures: [1:Fascia: No Fat Layer (Subcutaneous Tissue) Exposed:  No Tendon: No Muscle: No Joint: No Bone: No Limited to Skin Breakdown] [2:Fascia: No Fat Layer (Subcutaneous Tissue) Exposed: No Tendon: No Muscle: No Joint: No Bone: No Limited to Skin Breakdown]  [3:Fat Layer (Subcutaneous Tissue) Exposed: Yes Fascia: No Tendon: No Muscle: No Joint: No Bone: No] Epithelialization: [1:Large (67-100%)] [2:Large (67-100%)] [3:Small (1-33%)] Periwound Skin Texture: [1:No Abnormalities Noted] [3:No Abnormalities Noted] Excoriation: No Induration: No Callus: No Crepitus: No Rash: No Scarring: No Periwound Skin Moisture: No Abnormalities Noted Maceration: No No Abnormalities Noted Dry/Scaly: No Periwound Skin Color: Hemosiderin Staining: Yes Hemosiderin Staining: Yes Hemosiderin Staining: Yes Atrophie Blanche: No Cyanosis: No Ecchymosis: No Erythema: No Mottled: No Pallor: No Rubor: No Erythema Location: N/A N/A N/A Temperature: No Abnormality No Abnormality No Abnormality Tenderness on Palpation: Yes Yes Yes Wound Preparation: Ulcer Cleansing: Ulcer Cleansing: Ulcer  Cleansing: Rinsed/Irrigated with Saline Rinsed/Irrigated with Saline Rinsed/Irrigated with Saline Topical Anesthetic Applied: Topical Anesthetic Applied: Topical Anesthetic Applied: Other: lidocaine 4% None Other: lidocaine 4% Wound Number: [id number]  Photos: No Photos No Photos No Photos Wound Location: Left Wrist Left Hand - 5th Digit Left Knee - Lateral Wounding Event: Trauma Trauma Trauma Primary Etiology: Trauma, Other Trauma, Other Trauma, Other Comorbid History: Asthma, Hypertension, Type II Asthma, Hypertension, Type II Asthma, Hypertension, Type II Diabetes Diabetes Diabetes Date Acquired: 08/30/2018 08/30/2018 08/30/2018 Weeks of Treatment: 2 2 2  Wound Status: Open Open Open Clustered Wound: No No No Clustered Quantity: N/A N/A N/A Measurements L x W x D 0.8x0.7x0.1 0.2x0.5x0.1 2x0.3x0.1 (cm) Area (cm) : 0.44 0.079 0.471 Volume (cm) : 0.044 0.008 0.047 % Reduction in Area: 70.80% 0.00% 70.00% % Reduction in Volume: 70.90% 0.00% 70.10% Classification: Full Thickness Without Partial Thickness Full Thickness Without Exposed Support Structures Exposed Support Structures Exudate Amount: Medium Medium None Present Exudate Type: Serosanguineous Serosanguineous N/A Exudate Color: red, brown red, brown N/A Wound Margin: Flat and Intact Flat and Intact Flat and Intact Granulation Amount: None Present (0%) Small (1-33%) None Present (0%) Granulation Quality: N/A Pink N/A Necrotic Amount: Large (67-100%) Large (67-100%) Large (67-100%) Necrotic Tissue: Adherent Slough Eschar, Adherent Slough Eschar Exposed Structures: Fat Layer (Subcutaneous Fat Layer (Subcutaneous Fascia: No Tissue) Exposed: Yes Tissue) Exposed: Yes Fat Layer (Subcutaneous Fascia: No Fascia: No Tissue) Exposed: No Tendon: No Tendon: No Tendon: No ZAYVIAN, GEWIRTZ ([id number]) Muscle: No Muscle: No Muscle: No Joint: No Joint: No Joint: No Bone: No Bone: No Bone: No Limited to Skin  Breakdown Epithelialization: None None Large (67-100%) Periwound Skin Texture: Excoriation: No Excoriation: No Excoriation: No Induration: No Induration: No Induration: No Callus: No Callus: No Callus: No Crepitus: No Crepitus: No Crepitus: No Rash: No Rash: No Rash: No Scarring: No Scarring: No Scarring: No Periwound Skin Moisture: Maceration: No Maceration: No Maceration: No Dry/Scaly: No Dry/Scaly: No Dry/Scaly: No Periwound Skin Color: Hemosiderin Staining: Yes Hemosiderin Staining: Yes Hemosiderin Staining: Yes Atrophie Blanche: No Atrophie Blanche: No Atrophie Blanche: No Cyanosis: No Cyanosis: No Cyanosis: No Ecchymosis: No Ecchymosis: No Ecchymosis: No Erythema: No Erythema: No Erythema: No Mottled: No Mottled: No Mottled: No Pallor: No Pallor: No Pallor: No Rubor: No Rubor: No Rubor: No Erythema Location: N/A N/A N/A Temperature: No Abnormality No Abnormality No Abnormality Tenderness on Palpation: Yes Yes Yes Wound Preparation: Ulcer Cleansing: Ulcer Cleansing: Ulcer Cleansing: Rinsed/Irrigated with Saline Rinsed/Irrigated with Saline Rinsed/Irrigated with Saline Topical Anesthetic Applied: Topical Anesthetic Applied: Topical Anesthetic Applied: Other: lidocaine 4% Other: lidocaine 4% None Wound Number: [id number]  Photos: No Photos No Photos No Photos  Wound Location: Left Knee Right Knee Left Wrist - Medial Wounding Event: Trauma Trauma Trauma Primary Etiology: Trauma, Other Trauma, Other Skin Tear Comorbid History: Asthma, Hypertension, Type II Asthma, Hypertension, Type II Asthma, Hypertension, Type II Diabetes Diabetes Diabetes Date Acquired: 08/30/2018 08/30/2018 09/18/2018 Weeks of Treatment: 2 2 0 Wound Status: Open Open Open Clustered Wound: Yes No No Clustered Quantity: 2 N/A N/A Measurements L x W x D 1.8x2x0.1 0.5x0.7x0.1 0.2x0.6x0.1 (cm) Area (cm) : 2.827 0.275 0.094 Volume (cm) : 0.283 0.027 0.009 % Reduction in Area:  60.00% 75.70% N/A % Reduction in Volume: 60.00% 76.10% N/A Classification: Full Thickness Without Full Thickness Without Full Thickness Without Exposed Support Structures Exposed Support Structures Exposed Support Structures Exudate Amount: None Present Medium Medium Exudate Type: N/A Serosanguineous Serous Exudate Color: N/A red, brown amber Wound Margin: Flat and Intact Flat and Intact Flat and Intact Granulation Amount: None Present (0%) Small (1-33%) Large (67-100%) Granulation Quality: N/A Pink Red Necrotic Amount: Large (67-100%) Large (67-100%) None Present (0%) LATHYN, GRIGGS ([id number]) Necrotic Tissue: Eschar, Adherent Slough Eschar, Adherent Slough N/A Exposed Structures: Fat Layer (Subcutaneous Fat Layer (Subcutaneous Fat Layer (Subcutaneous Tissue) Exposed: Yes Tissue) Exposed: Yes Tissue) Exposed: Yes Fascia: No Fascia: No Fascia: No Tendon: No Tendon: No Tendon: No Muscle: No Muscle: No Muscle: No Joint: No Joint: No Joint: No Bone: No Bone: No Bone: No Epithelialization: Medium (34-66%) Small (1-33%) None Periwound Skin Texture: Excoriation: No Excoriation: No Excoriation: No Induration: No Induration: No Induration: No Callus: No Callus: No Callus: No Crepitus: No Crepitus: No Crepitus: No Rash: No Rash: No Rash: No Scarring: No Scarring: No Scarring: No Periwound Skin Moisture: Maceration: No Maceration: No Maceration: No Dry/Scaly: No Dry/Scaly: No Dry/Scaly: No Periwound Skin Color: Erythema: Yes Atrophie Blanche: No Atrophie Blanche: No Atrophie Blanche: No Cyanosis: No Cyanosis: No Cyanosis: No Ecchymosis: No Ecchymosis: No Ecchymosis: No Erythema: No Erythema: No Hemosiderin Staining: No Hemosiderin Staining: No Hemosiderin Staining: No Mottled: No Mottled: No Mottled: No Pallor: No Pallor: No Pallor: No Rubor: No Rubor: No Rubor: No Erythema Location: Circumferential N/A N/A Temperature: No Abnormality No  Abnormality N/A Tenderness on Palpation: Yes Yes No Wound Preparation: Ulcer Cleansing: Ulcer Cleansing: Ulcer Cleansing: Rinsed/Irrigated with Saline Rinsed/Irrigated with Saline Rinsed/Irrigated with Saline Topical Anesthetic Applied: Topical Anesthetic Applied: Topical Anesthetic Applied: Other: lidocaine 4% Other: lidocaine 4% Other: lidocaine 4% Treatment Notes Electronic Signature(s) Signed: 09/18/2018 5:43:44 PM By: Elliot Gurney, BSN, RN, CWS, Kim RN, BSN Entered By: Elliot Gurney, BSN, RN, CWS, Kim on 09/18/2018 08:32:44 George Paris ([id number]) -------------------------------------------------------------------------------- Multi-Disciplinary Care Plan Details Patient Name: BURLEIGH, BROCKMANN. Date of Service: 09/18/2018 8:00 AM Medical Record Number: [id number] Patient Account Number: [id number] Date of Birth/Sex: Mar 28, 1931 (85 y.o. M) Treating RN: Huel Coventry Primary Care Kirstein Baxley: Bethann Punches Other Clinician: Referring Mylie Mccurley: Bethann Punches Treating Mossie Gilder/Extender: Linwood Dibbles, HOYT Weeks in Treatment: 2 Active Inactive Abuse / Safety / Falls / Self Care Management Nursing Diagnoses: History of Falls Goals: Patient will not experience any injury related to falls Date Initiated: 09/04/2018 Target Resolution Date: 11/29/2018 Goal Status: Active Interventions: Assess fall risk on admission and as needed Notes: Orientation to the Wound Care Program Nursing Diagnoses: Knowledge deficit related to the wound healing center program Goals: Patient/caregiver will verbalize understanding of the Wound Healing Center Program Date Initiated: 09/04/2018 Target Resolution Date: 11/29/2018 Goal Status: Active Interventions: Provide education on orientation to the wound center Notes: Wound/Skin Impairment Nursing Diagnoses: Impaired tissue integrity Goals: Ulcer/skin breakdown will heal within 14 weeks  Date Initiated: 09/04/2018 Target Resolution Date: 11/29/2018 Goal Status:  Active Interventions: Assess patient/caregiver ability to obtain necessary supplies KAESEN, RODRIGUEZ ([id number]) Assess patient/caregiver ability to perform ulcer/skin care regimen upon admission and as needed Assess ulceration(s) every visit Notes: Electronic Signature(s) Signed: 09/18/2018 5:43:44 PM By: Elliot Gurney, BSN, RN, CWS, Kim RN, BSN Entered By: Elliot Gurney, BSN, RN, CWS, Kim on 09/18/2018 08:32:36 CABELL, LAZENBY ([id number]) -------------------------------------------------------------------------------- Pain Assessment Details Patient Name: PRANAV, LINCE. Date of Service: 09/18/2018 8:00 AM Medical Record Number: [id number] Patient Account Number: [id number] Date of Birth/Sex: [birth year] (85 y.o. M) Treating RN: Arnette Norris Primary Care Dare Spillman: Bethann Punches Other Clinician: Referring Lelia Jons: Bethann Punches Treating Nathin Saran/Extender: Linwood Dibbles, HOYT Weeks in Treatment: 2 Active Problems Location of Pain Severity and Description of Pain Patient Has Paino No Site Locations Pain Management and Medication Current Pain Management: Electronic Signature(s) Signed: 09/18/2018 11:33:04 AM By: Sallee Provencal, RRT, CHT Signed: 09/23/2018 5:16:49 PM By: Arnette Norris Entered By: Dayton Martes on 09/18/2018 08:06:20 George Paris ([id number]) -------------------------------------------------------------------------------- Patient/Caregiver Education Details Patient Name: TARRON, KROLAK. Date of Service: 09/18/2018 8:00 AM Medical Record Number: [id number] Patient Account Number: [id number] Date of Birth/Gender: 1931-07-27 (85 y.o. M) Treating RN: Huel Coventry Primary Care Physician: Bethann Punches Other Clinician: Referring Physician: Bethann Punches Treating Physician/Extender: Linwood Dibbles, HOYT Weeks in Treatment: 2 Education Assessment Education Provided To: Patient Education Topics Provided Wound/Skin Impairment: Handouts: Caring for Your  Ulcer Methods: Demonstration, Explain/Verbal Responses: State content correctly Electronic Signature(s) Signed: 09/18/2018 5:43:44 PM By: Elliot Gurney, BSN, RN, CWS, Kim RN, BSN Entered By: Elliot Gurney, BSN, RN, CWS, Kim on 09/18/2018 08:47:26 EDUAR, KUMPF ([id number]) -------------------------------------------------------------------------------- Wound Assessment Details Patient Name: DUNG, SALINGER. Date of Service: 09/18/2018 8:00 AM Medical Record Number: [id number] Patient Account Number: [id number] Date of Birth/Sex: [birth year] (85 y.o. M) Treating RN: Curtis Sites Primary Care Brent Taillon: Bethann Punches Other Clinician: Referring Deantae Shackleton: Bethann Punches Treating Kristle Wesch/Extender: Linwood Dibbles, HOYT Weeks in Treatment: 2 Wound Status Wound Number: 1 Primary Etiology: Trauma, Other Wound Location: Left Elbow Wound Status: Open Wounding Event: Trauma Comorbid History: Asthma, Hypertension, Type II Diabetes Date Acquired: 08/30/2018 Weeks Of Treatment: 2 Clustered Wound: No Photos Photo Uploaded By: Curtis Sites on 09/18/2018 09:51:02 Wound Measurements Length: (cm) 0.1 Width: (cm) 0.1 Depth: (cm) 0.1 Area: (cm) 0.008 Volume: (cm) 0.001 % Reduction in Area: 99.9% % Reduction in Volume: 99.9% Epithelialization: Large (67-100%) Tunneling: No Undermining: No Wound Description Full Thickness Without Exposed Support Classification: Structures Wound Margin: Flat and Intact Exudate None Present Amount: Foul Odor After Cleansing: No Slough/Fibrino No Wound Bed Granulation Amount: None Present (0%) Exposed Structure Necrotic Amount: None Present (0%) Fascia Exposed: No Fat Layer (Subcutaneous Tissue) Exposed: No Tendon Exposed: No Muscle Exposed: No Joint Exposed: No Bone Exposed: No Limited to Skin Breakdown Periwound Skin Texture CORDARO, MUKAI ([id number]) Texture Color No Abnormalities Noted: No No Abnormalities Noted: No Hemosiderin Staining: Yes Moisture No  Abnormalities Noted: No Temperature / Pain Temperature: No Abnormality Tenderness on Palpation: Yes Wound Preparation Ulcer Cleansing: Rinsed/Irrigated with Saline Topical Anesthetic Applied: Other: lidocaine 4%, Electronic Signature(s) Signed: 09/18/2018 5:09:04 PM By: Curtis Sites Entered By: Curtis Sites on 09/18/2018 08:16:10 SIM, CHOQUETTE ([id number]) -------------------------------------------------------------------------------- Wound Assessment Details Patient Name: EMIN, FOREE. Date of Service: 09/18/2018 8:00 AM Medical Record Number: [id number] Patient Account Number: [id number] Date of Birth/Sex: 1930-09-01 (85 y.o. M) Treating RN: Curtis Sites Primary Care Vicke Plotner: Bethann Punches Other Clinician: Referring Montarius Kitagawa: Bethann Punches Treating Kiyara Bouffard/Extender: Linwood Dibbles, HOYT  Weeks in Treatment: 2 Wound Status Wound Number: 2 Primary Etiology: Trauma, Other Wound Location: Left Forearm - Proximal Wound Status: Open Wounding Event: Trauma Comorbid History: Asthma, Hypertension, Type II Diabetes Date Acquired: 08/30/2018 Weeks Of Treatment: 2 Clustered Wound: No Photos Photo Uploaded By: Curtis Sitesorthy, Joanna on 09/18/2018 09:51:03 Wound Measurements Length: (cm) 0.1 Width: (cm) 0.1 Depth: (cm) 0.1 Area: (cm) 0.008 Volume: (cm) 0.001 % Reduction in Area: 99.8% % Reduction in Volume: 99.7% Epithelialization: Large (67-100%) Tunneling: No Undermining: No Wound Description Full Thickness Without Exposed Support Classification: Structures Wound Margin: Flat and Intact Exudate None Present Amount: Foul Odor After Cleansing: No Slough/Fibrino No Wound Bed Granulation Amount: None Present (0%) Exposed Structure Necrotic Amount: None Present (0%) Fascia Exposed: No Fat Layer (Subcutaneous Tissue) Exposed: No Tendon Exposed: No Muscle Exposed: No Joint Exposed: No Bone Exposed: No Limited to Skin Breakdown Periwound Skin Texture George Hensley, George F.  ([id number]) Texture Color No Abnormalities Noted: No No Abnormalities Noted: No Callus: No Atrophie Blanche: No Crepitus: No Cyanosis: No Excoriation: No Ecchymosis: No Induration: No Erythema: No Rash: No Hemosiderin Staining: Yes Scarring: No Mottled: No Pallor: No Moisture Rubor: No No Abnormalities Noted: No Dry / Scaly: No Temperature / Pain Maceration: No Temperature: No Abnormality Tenderness on Palpation: Yes Wound Preparation Ulcer Cleansing: Rinsed/Irrigated with Saline Topical Anesthetic Applied: None Electronic Signature(s) Signed: 09/18/2018 5:09:04 PM By: Curtis Sitesorthy, Joanna Entered By: Curtis Sitesorthy, Joanna on 09/18/2018 08:17:00 George Hensley, George F. ([id number]) -------------------------------------------------------------------------------- Wound Assessment Details Patient Name: George Hensley, Platon F. Date of Service: 09/18/2018 8:00 AM Medical Record Number: [id number] Patient Account Number: [id number] Date of Birth/Sex: 19-Feb-1931 (85 y.o. M) Treating RN: Curtis Sitesorthy, Joanna Primary Care Katora Fini: Bethann PunchesMiller, Mark Other Clinician: Referring Dorice Stiggers: Bethann PunchesMiller, Mark Treating Deavin Forst/Extender: Linwood DibblesSTONE III, HOYT Weeks in Treatment: 2 Wound Status Wound Number: 3 Primary Etiology: Trauma, Other Wound Location: Left Forearm - Distal Wound Status: Open Wounding Event: Trauma Comorbid History: Asthma, Hypertension, Type II Diabetes Date Acquired: 08/30/2018 Weeks Of Treatment: 2 Clustered Wound: No Photos Photo Uploaded By: Curtis Sitesorthy, Joanna on 09/18/2018 09:52:03 Wound Measurements Length: (cm) 1 Width: (cm) 4.2 Depth: (cm) 0.1 Area: (cm) 3.299 Volume: (cm) 0.33 % Reduction in Area: 60% % Reduction in Volume: 60% Epithelialization: Small (1-33%) Tunneling: No Undermining: No Wound Description Full Thickness Without Exposed Support Classification: Structures Wound Margin: Flat and Intact Exudate Medium Amount: Exudate Type: Serosanguineous Exudate Color: red,  brown Foul Odor After Cleansing: No Slough/Fibrino Yes Wound Bed Granulation Amount: Small (1-33%) Exposed Structure Granulation Quality: Pink Fascia Exposed: No Necrotic Amount: Large (67-100%) Fat Layer (Subcutaneous Tissue) Exposed: Yes Necrotic Quality: Eschar, Adherent Slough Tendon Exposed: No Muscle Exposed: No Joint Exposed: No Bone Exposed: No George Hensley, George F. ([id number]) Periwound Skin Texture Texture Color No Abnormalities Noted: No No Abnormalities Noted: No Hemosiderin Staining: Yes Moisture No Abnormalities Noted: No Temperature / Pain Temperature: No Abnormality Tenderness on Palpation: Yes Wound Preparation Ulcer Cleansing: Rinsed/Irrigated with Saline Topical Anesthetic Applied: Other: lidocaine 4%, Electronic Signature(s) Signed: 09/18/2018 5:09:04 PM By: Curtis Sitesorthy, Joanna Entered By: Curtis Sitesorthy, Joanna on 09/18/2018 08:17:47 George Hensley, George F. ([id number]) -------------------------------------------------------------------------------- Wound Assessment Details Patient Name: George Hensley, Giorgi F. Date of Service: 09/18/2018 8:00 AM Medical Record Number: [id number] Patient Account Number: [id number] Date of Birth/Sex: 19-Feb-1931 (85 y.o. M) Treating RN: Curtis Sitesorthy, Joanna Primary Care Tamu Golz: Bethann PunchesMiller, Mark Other Clinician: Referring Iam Lipson: Bethann PunchesMiller, Mark Treating Rockne Dearinger/Extender: Linwood DibblesSTONE III, HOYT Weeks in Treatment: 2 Wound Status Wound Number: 4 Primary Etiology: Trauma, Other Wound Location: Left Wrist Wound Status: Open Wounding Event: Trauma Comorbid History:  Asthma, Hypertension, Type II Diabetes Date Acquired: 08/30/2018 Weeks Of Treatment: 2 Clustered Wound: No Photos Photo Uploaded By: Curtis Sites on 09/18/2018 09:52:04 Wound Measurements Length: (cm) 0.8 Width: (cm) 0.7 Depth: (cm) 0.1 Area: (cm) 0.44 Volume: (cm) 0.044 % Reduction in Area: 70.8% % Reduction in Volume: 70.9% Epithelialization: None Tunneling: No Undermining: No Wound  Description Full Thickness Without Exposed Support Classification: Structures Wound Margin: Flat and Intact Exudate Medium Amount: Exudate Type: Serosanguineous Exudate Color: red, brown Foul Odor After Cleansing: No Slough/Fibrino Yes Wound Bed Granulation Amount: None Present (0%) Exposed Structure Necrotic Amount: Large (67-100%) Fascia Exposed: No Necrotic Quality: Adherent Slough Fat Layer (Subcutaneous Tissue) Exposed: Yes Tendon Exposed: No Muscle Exposed: No Joint Exposed: No Bone Exposed: No ANJEL, PARDO ([id number]) Periwound Skin Texture Texture Color No Abnormalities Noted: No No Abnormalities Noted: No Callus: No Atrophie Blanche: No Crepitus: No Cyanosis: No Excoriation: No Ecchymosis: No Induration: No Erythema: No Rash: No Hemosiderin Staining: Yes Scarring: No Mottled: No Pallor: No Moisture Rubor: No No Abnormalities Noted: No Dry / Scaly: No Temperature / Pain Maceration: No Temperature: No Abnormality Tenderness on Palpation: Yes Wound Preparation Ulcer Cleansing: Rinsed/Irrigated with Saline Topical Anesthetic Applied: Other: lidocaine 4%, Electronic Signature(s) Signed: 09/18/2018 5:09:04 PM By: Curtis Sites Entered By: Curtis Sites on 09/18/2018 08:19:20 ZAYDON, KINSER ([id number]) -------------------------------------------------------------------------------- Wound Assessment Details Patient Name: CISCO, KINDT. Date of Service: 09/18/2018 8:00 AM Medical Record Number: [id number] Patient Account Number: [id number] Date of Birth/Sex: March 12, 1931 (85 y.o. M) Treating RN: Curtis Sites Primary Care Vonda Harth: Bethann Punches Other Clinician: Referring Tarvis Blossom: Bethann Punches Treating Mahmood Boehringer/Extender: Linwood Dibbles, HOYT Weeks in Treatment: 2 Wound Status Wound Number: 5 Primary Etiology: Trauma, Other Wound Location: Left Hand - 5th Digit Wound Status: Open Wounding Event: Trauma Comorbid History: Asthma, Hypertension,  Type II Diabetes Date Acquired: 08/30/2018 Weeks Of Treatment: 2 Clustered Wound: No Photos Photo Uploaded By: Curtis Sites on 09/18/2018 09:52:53 Wound Measurements Length: (cm) 0.2 Width: (cm) 0.5 Depth: (cm) 0.1 Area: (cm) 0.079 Volume: (cm) 0.008 % Reduction in Area: 0% % Reduction in Volume: 0% Epithelialization: None Tunneling: No Undermining: No Wound Description Classification: Partial Thickness Wound Margin: Flat and Intact Exudate Amount: Medium Exudate Type: Serosanguineous Exudate Color: red, brown Foul Odor After Cleansing: No Slough/Fibrino Yes Wound Bed Granulation Amount: Small (1-33%) Exposed Structure Granulation Quality: Pink Fascia Exposed: No Necrotic Amount: Large (67-100%) Fat Layer (Subcutaneous Tissue) Exposed: Yes Necrotic Quality: Eschar, Adherent Slough Tendon Exposed: No Muscle Exposed: No Joint Exposed: No Bone Exposed: No Periwound Skin Texture TORRENCE, HAMMACK ([id number]) Texture Color No Abnormalities Noted: No No Abnormalities Noted: No Callus: No Atrophie Blanche: No Crepitus: No Cyanosis: No Excoriation: No Ecchymosis: No Induration: No Erythema: No Rash: No Hemosiderin Staining: Yes Scarring: No Mottled: No Pallor: No Moisture Rubor: No No Abnormalities Noted: No Dry / Scaly: No Temperature / Pain Maceration: No Temperature: No Abnormality Tenderness on Palpation: Yes Wound Preparation Ulcer Cleansing: Rinsed/Irrigated with Saline Topical Anesthetic Applied: Other: lidocaine 4%, Electronic Signature(s) Signed: 09/18/2018 5:09:04 PM By: Curtis Sites Entered By: Curtis Sites on 09/18/2018 08:20:15 LUKA, STOHR ([id number]) -------------------------------------------------------------------------------- Wound Assessment Details Patient Name: DEMAREE, LIBERTO. Date of Service: 09/18/2018 8:00 AM Medical Record Number: [id number] Patient Account Number: [id number] Date of Birth/Sex: 1931/02/24 (85 y.o.  M) Treating RN: Huel Coventry Primary Care Delcie Ruppert: Bethann Punches Other Clinician: Referring Quintavious Rinck: Bethann Punches Treating Crystall Donaldson/Extender: Linwood Dibbles, HOYT Weeks in Treatment: 2 Wound Status Wound Number: 6 Primary Etiology: Trauma, Other Wound Location: Left  Knee - Lateral Wound Status: Open Wounding Event: Trauma Comorbid History: Asthma, Hypertension, Type II Diabetes Date Acquired: 08/30/2018 Weeks Of Treatment: 2 Clustered Wound: No Photos Photo Uploaded By: Elliot Gurney, BSN, RN, CWS, Kim on 09/18/2018 17:24:54 Wound Measurements Length: (cm) 2 Width: (cm) 0.3 Depth: (cm) 0.1 Area: (cm) 0.471 Volume: (cm) 0.047 % Reduction in Area: 70% % Reduction in Volume: 70.1% Epithelialization: Large (67-100%) Undermining: No Wound Description Full Thickness Without Exposed Support Foul Odor Classification: Structures Slough/Fi Wound Margin: Flat and Intact Exudate None Present Amount: After Cleansing: No brino No Wound Bed Granulation Amount: None Present (0%) Exposed Structure Necrotic Amount: Large (67-100%) Fascia Exposed: No Necrotic Quality: Eschar Fat Layer (Subcutaneous Tissue) Exposed: No Tendon Exposed: No Muscle Exposed: No Joint Exposed: No Bone Exposed: No Limited to Skin Breakdown Periwound Skin Texture JAMERION, CABELLO ([id number]) Texture Color No Abnormalities Noted: No No Abnormalities Noted: No Callus: No Atrophie Blanche: No Crepitus: No Cyanosis: No Excoriation: No Ecchymosis: No Induration: No Erythema: No Rash: No Hemosiderin Staining: Yes Scarring: No Mottled: No Pallor: No Moisture Rubor: No No Abnormalities Noted: No Dry / Scaly: No Temperature / Pain Maceration: No Temperature: No Abnormality Tenderness on Palpation: Yes Wound Preparation Ulcer Cleansing: Rinsed/Irrigated with Saline Topical Anesthetic Applied: None Electronic Signature(s) Signed: 09/18/2018 8:21:16 AM By: Elliot Gurney, BSN, RN, CWS, Kim RN, BSN Entered By:  Elliot Gurney, BSN, RN, CWS, Kim on 09/18/2018 08:21:16 George Paris ([id number]) -------------------------------------------------------------------------------- Wound Assessment Details Patient Name: BAYLIN, GAMBLIN. Date of Service: 09/18/2018 8:00 AM Medical Record Number: [id number] Patient Account Number: [id number] Date of Birth/Sex: Apr 27, 1931 (85 y.o. M) Treating RN: Huel Coventry Primary Care Kaida Games: Bethann Punches Other Clinician: Referring Day Greb: Bethann Punches Treating Chinedum Vanhouten/Extender: Linwood Dibbles, HOYT Weeks in Treatment: 2 Wound Status Wound Number: 7 Primary Etiology: Trauma, Other Wound Location: Left Knee Wound Status: Open Wounding Event: Trauma Comorbid History: Asthma, Hypertension, Type II Diabetes Date Acquired: 08/30/2018 Weeks Of Treatment: 2 Clustered Wound: Yes Photos Photo Uploaded By: Elliot Gurney, BSN, RN, CWS, Kim on 09/18/2018 17:25:45 Wound Measurements Length: (cm) 1.8 Width: (cm) 2 Depth: (cm) 0.1 Clustered Quantity: 2 Area: (cm) 2.827 Volume: (cm) 0.283 % Reduction in Area: 60% % Reduction in Volume: 60% Epithelialization: Medium (34-66%) Tunneling: No Undermining: No Wound Description Full Thickness Without Exposed Support Foul Odor Classification: Structures Slough/Fi Wound Margin: Flat and Intact Exudate None Present Amount: After Cleansing: No brino No Wound Bed Granulation Amount: None Present (0%) Exposed Structure Necrotic Amount: Large (67-100%) Fascia Exposed: No Necrotic Quality: Eschar, Adherent Slough Fat Layer (Subcutaneous Tissue) Exposed: Yes Tendon Exposed: No Muscle Exposed: No Joint Exposed: No Bone Exposed: No Periwound Skin Texture DOMANIC, MATUSEK ([id number]) Texture Color No Abnormalities Noted: No No Abnormalities Noted: No Callus: No Atrophie Blanche: No Crepitus: No Cyanosis: No Excoriation: No Ecchymosis: No Induration: No Erythema: Yes Rash: No Erythema Location: Circumferential Scarring:  No Hemosiderin Staining: No Mottled: No Moisture Pallor: No No Abnormalities Noted: No Rubor: No Dry / Scaly: No Maceration: No Temperature / Pain Temperature: No Abnormality Tenderness on Palpation: Yes Wound Preparation Ulcer Cleansing: Rinsed/Irrigated with Saline Topical Anesthetic Applied: Other: lidocaine 4%, Electronic Signature(s) Signed: 09/18/2018 8:22:15 AM By: Elliot Gurney, BSN, RN, CWS, Kim RN, BSN Entered By: Elliot Gurney, BSN, RN, CWS, Kim on 09/18/2018 08:22:15 CHEROKEE, CLOWERS ([id number]) -------------------------------------------------------------------------------- Wound Assessment Details Patient Name: REON, HUNLEY. Date of Service: 09/18/2018 8:00 AM Medical Record Number: [id number] Patient Account Number: [id number] Date of Birth/Sex: Jan 17, 1931 (85 y.o. M) Treating RN: Huel Coventry Primary Care Derra Shartzer: Hyacinth Meeker,  Mark Other Clinician: Referring Breyanna Valera: Bethann Punches Treating Nakiesha Rumsey/Extender: Linwood Dibbles, HOYT Weeks in Treatment: 2 Wound Status Wound Number: 8 Primary Etiology: Trauma, Other Wound Location: Right Knee Wound Status: Open Wounding Event: Trauma Comorbid History: Asthma, Hypertension, Type II Diabetes Date Acquired: 08/30/2018 Weeks Of Treatment: 2 Clustered Wound: No Photos Photo Uploaded By: Elliot Gurney, BSN, RN, CWS, Kim on 09/18/2018 17:25:46 Wound Measurements Length: (cm) 0.5 Width: (cm) 0.7 Depth: (cm) 0.1 Area: (cm) 0.275 Volume: (cm) 0.027 % Reduction in Area: 75.7% % Reduction in Volume: 76.1% Epithelialization: Small (1-33%) Tunneling: No Undermining: No Wound Description Full Thickness Without Exposed Support Foul Odo Classification: Structures Slough/F Wound Margin: Flat and Intact Exudate Medium Amount: Exudate Type: Serosanguineous Exudate Color: red, brown r After Cleansing: No ibrino Yes Wound Bed Granulation Amount: Small (1-33%) Exposed Structure Granulation Quality: Pink Fascia Exposed: No Necrotic Amount:  Large (67-100%) Fat Layer (Subcutaneous Tissue) Exposed: Yes Necrotic Quality: Eschar, Adherent Slough Tendon Exposed: No Muscle Exposed: No Joint Exposed: No Bone Exposed: No JAQWON, MANFRED ([id number]) Periwound Skin Texture Texture Color No Abnormalities Noted: No No Abnormalities Noted: No Callus: No Atrophie Blanche: No Crepitus: No Cyanosis: No Excoriation: No Ecchymosis: No Induration: No Erythema: No Rash: No Hemosiderin Staining: No Scarring: No Mottled: No Pallor: No Moisture Rubor: No No Abnormalities Noted: No Dry / Scaly: No Temperature / Pain Maceration: No Temperature: No Abnormality Tenderness on Palpation: Yes Wound Preparation Ulcer Cleansing: Rinsed/Irrigated with Saline Topical Anesthetic Applied: Other: lidocaine 4%, Electronic Signature(s) Signed: 09/18/2018 8:23:03 AM By: Elliot Gurney, BSN, RN, CWS, Kim RN, BSN Entered By: Elliot Gurney, BSN, RN, CWS, Kim on 09/18/2018 08:23:03 DELNO, BLAISDELL ([id number]) -------------------------------------------------------------------------------- Wound Assessment Details Patient Name: JOHNDAVID, GERALDS. Date of Service: 09/18/2018 8:00 AM Medical Record Number: [id number] Patient Account Number: [id number] Date of Birth/Sex: [birth year] (85 y.o. M) Treating RN: Curtis Sites Primary Care Rebekkah Powless: Bethann Punches Other Clinician: Referring Amritpal Shropshire: Bethann Punches Treating Angelos Wasco/Extender: Linwood Dibbles, HOYT Weeks in Treatment: 2 Wound Status Wound Number: 9 Primary Etiology: Skin Tear Wound Location: Left Wrist - Medial Wound Status: Open Wounding Event: Trauma Comorbid History: Asthma, Hypertension, Type II Diabetes Date Acquired: 09/18/2018 Weeks Of Treatment: 0 Clustered Wound: No Photos Photo Uploaded By: Curtis Sites on 09/18/2018 09:52:54 Wound Measurements Length: (cm) 0.2 Width: (cm) 0.6 Depth: (cm) 0.1 Area: (cm) 0.094 Volume: (cm) 0.009 % Reduction in Area: % Reduction in  Volume: Epithelialization: None Tunneling: No Undermining: No Wound Description Full Thickness Without Exposed Support Classification: Structures Wound Margin: Flat and Intact Exudate Medium Amount: Exudate Type: Serous Exudate Color: amber Foul Odor After Cleansing: No Slough/Fibrino No Wound Bed Granulation Amount: Large (67-100%) Exposed Structure Granulation Quality: Red Fascia Exposed: No Necrotic Amount: None Present (0%) Fat Layer (Subcutaneous Tissue) Exposed: Yes Tendon Exposed: No Muscle Exposed: No Joint Exposed: No Bone Exposed: No FIORE, DETJEN ([id number]) Periwound Skin Texture Texture Color No Abnormalities Noted: No No Abnormalities Noted: No Callus: No Atrophie Blanche: No Crepitus: No Cyanosis: No Excoriation: No Ecchymosis: No Induration: No Erythema: No Rash: No Hemosiderin Staining: No Scarring: No Mottled: No Pallor: No Moisture Rubor: No No Abnormalities Noted: No Dry / Scaly: No Maceration: No Wound Preparation Ulcer Cleansing: Rinsed/Irrigated with Saline Topical Anesthetic Applied: Other: lidocaine 4%, Electronic Signature(s) Signed: 09/18/2018 5:09:04 PM By: Curtis Sites Entered By: Curtis Sites on 09/18/2018 08:24:01 SEBASTYAN, SNODGRASS ([id number]) -------------------------------------------------------------------------------- Vitals Details Patient Name: KOHEN, REITHER. Date of Service: 09/18/2018 8:00 AM Medical Record Number: [id number] Patient Account Number: [id number] Date of Birth/Sex: [birth year] (85  y.o. M) Treating RN: Arnette Norris Primary Care Login Muckleroy: Bethann Punches Other Clinician: Referring Wrangler Penning: Bethann Punches Treating Shavanna Furnari/Extender: Linwood Dibbles, HOYT Weeks in Treatment: 2 Vital Signs Time Taken: 08:06 Temperature (F): 97.6 Height (in): 70 Pulse (bpm): 72 Weight (lbs): 160 Respiratory Rate (breaths/min): 18 Body Mass Index (BMI): 23 Blood Pressure (mmHg): 136/66 Reference Range: 80 -  120 mg / dl Airway Electronic Signature(s) Signed: 09/18/2018 11:33:04 AM By: Dayton Martes RCP, RRT, CHT Entered By: Dayton Martes on 09/18/2018 08:09:19

## 2018-09-25 ENCOUNTER — Encounter: Payer: Medicare Other | Admitting: Physician Assistant

## 2018-09-25 DIAGNOSIS — H3530 Unspecified macular degeneration: Secondary | ICD-10-CM | POA: Diagnosis not present

## 2018-09-25 DIAGNOSIS — E11622 Type 2 diabetes mellitus with other skin ulcer: Secondary | ICD-10-CM | POA: Diagnosis not present

## 2018-09-25 DIAGNOSIS — L98492 Non-pressure chronic ulcer of skin of other sites with fat layer exposed: Secondary | ICD-10-CM | POA: Diagnosis not present

## 2018-09-25 DIAGNOSIS — L98499 Non-pressure chronic ulcer of skin of other sites with unspecified severity: Secondary | ICD-10-CM | POA: Diagnosis not present

## 2018-09-25 DIAGNOSIS — Z87891 Personal history of nicotine dependence: Secondary | ICD-10-CM | POA: Diagnosis not present

## 2018-09-25 DIAGNOSIS — S81002A Unspecified open wound, left knee, initial encounter: Secondary | ICD-10-CM | POA: Diagnosis not present

## 2018-09-25 DIAGNOSIS — S61502A Unspecified open wound of left wrist, initial encounter: Secondary | ICD-10-CM | POA: Diagnosis not present

## 2018-09-25 DIAGNOSIS — S51002A Unspecified open wound of left elbow, initial encounter: Secondary | ICD-10-CM | POA: Diagnosis not present

## 2018-09-25 DIAGNOSIS — L97822 Non-pressure chronic ulcer of other part of left lower leg with fat layer exposed: Secondary | ICD-10-CM | POA: Diagnosis not present

## 2018-09-25 DIAGNOSIS — Z8673 Personal history of transient ischemic attack (TIA), and cerebral infarction without residual deficits: Secondary | ICD-10-CM | POA: Diagnosis not present

## 2018-09-25 DIAGNOSIS — S81001A Unspecified open wound, right knee, initial encounter: Secondary | ICD-10-CM | POA: Diagnosis not present

## 2018-09-25 DIAGNOSIS — L97829 Non-pressure chronic ulcer of other part of left lower leg with unspecified severity: Secondary | ICD-10-CM | POA: Diagnosis not present

## 2018-09-25 DIAGNOSIS — S51802A Unspecified open wound of left forearm, initial encounter: Secondary | ICD-10-CM | POA: Diagnosis not present

## 2018-09-25 DIAGNOSIS — J45909 Unspecified asthma, uncomplicated: Secondary | ICD-10-CM | POA: Diagnosis not present

## 2018-09-25 DIAGNOSIS — I10 Essential (primary) hypertension: Secondary | ICD-10-CM | POA: Diagnosis not present

## 2018-09-25 DIAGNOSIS — S61402A Unspecified open wound of left hand, initial encounter: Secondary | ICD-10-CM | POA: Diagnosis not present

## 2018-10-01 DIAGNOSIS — E119 Type 2 diabetes mellitus without complications: Secondary | ICD-10-CM | POA: Diagnosis not present

## 2018-10-02 ENCOUNTER — Ambulatory Visit: Payer: Medicare Other | Admitting: Physician Assistant

## 2018-10-07 ENCOUNTER — Encounter: Payer: Medicare Other | Attending: Physician Assistant | Admitting: Physician Assistant

## 2018-10-07 DIAGNOSIS — L98492 Non-pressure chronic ulcer of skin of other sites with fat layer exposed: Secondary | ICD-10-CM | POA: Diagnosis not present

## 2018-10-07 DIAGNOSIS — Z8673 Personal history of transient ischemic attack (TIA), and cerebral infarction without residual deficits: Secondary | ICD-10-CM | POA: Diagnosis not present

## 2018-10-07 DIAGNOSIS — E11622 Type 2 diabetes mellitus with other skin ulcer: Secondary | ICD-10-CM | POA: Insufficient documentation

## 2018-10-07 DIAGNOSIS — I10 Essential (primary) hypertension: Secondary | ICD-10-CM | POA: Diagnosis not present

## 2018-10-07 DIAGNOSIS — L97822 Non-pressure chronic ulcer of other part of left lower leg with fat layer exposed: Secondary | ICD-10-CM | POA: Diagnosis not present

## 2018-10-07 DIAGNOSIS — Z87891 Personal history of nicotine dependence: Secondary | ICD-10-CM | POA: Insufficient documentation

## 2018-10-12 NOTE — Progress Notes (Signed)
George Hensley, Dinero F. ([id number]) Visit Report for 10/07/2018 Chief Complaint Document Details Patient Name: George Hensley, George F. Date of Service: 10/07/2018 9:30 AM Medical Record Number: [id number] Patient Account Number: [id number] Date of Birth/Sex: Oct 02, 1930 (85 y.o. M) Treating RN: Curtis Sitesorthy, Joanna Primary Care Provider: Bethann PunchesMiller, Mark Other Clinician: Referring Provider: Bethann PunchesMiller, Mark Treating Provider/Extender: Linwood DibblesSTONE III, HOYT Weeks in Treatment: 4 Information Obtained from: Patient Chief Complaint Multiple Left UE and Bilateral LE Ulcers Electronic Signature(s) Signed: 10/12/2018 7:15:48 AM By: Lenda KelpStone III, Hoyt PA-C Entered By: Lenda KelpStone III, Hoyt on 10/07/2018 09:45:15 George Hensley, [name] ([id number]) -------------------------------------------------------------------------------- HPI Details Patient Name: George Hensley, George F. Date of Service: 10/07/2018 9:30 AM Medical Record Number: [id number] Patient Account Number: [id number] Date of Birth/Sex: Oct 02, 1930 (85 y.o. M) Treating RN: Curtis Sitesorthy, Joanna Primary Care Provider: Bethann PunchesMiller, Mark Other Clinician: Referring Provider: Bethann PunchesMiller, Mark Treating Provider/Extender: Linwood DibblesSTONE III, HOYT Weeks in Treatment: 4 History of Present Illness HPI Description: 09/04/18 on evaluation today patient actually appears to be doing somewhat poorly upon initial evaluation in regard to his left upper extremity in his bilateral knees. He unfortunately sustained a fall while he was at the beach this past Saturday 08/30/18. Nonetheless he was seen in the emergency department initially and then subsequently was seen by his primary care provider Dr. Hyacinth MeekerMiller who referred him to us for further evaluation and treatment. Subsequently he was given a tetanus shot during the course of all this and also was placed on Flex 250 mg three times a day for 10 days by Dr. Hyacinth MeekerMiller. Currently the patient has several wounds in varying stages of stability over the left upper extremity and the bilateral  knee regions. He does have a history of diabetes mellitus type II and hypertension. 09/11/18 on evaluation today patient appears to be doing much better in regard to his multiple upper and lower Trinity ulcer's. All are measuring smaller today which is excellent news. Overall he has been tolerating the dressing changes although unfortunately he has not received his dressings. They been using the Bactroban ointment along with nonadherent pads they purchased at the pharmacy. 09/18/18 on evaluation today patient appears to be doing rather well at this point from the standpoint of his ulcers. Fortunately there does not appear to be any evidence of infection at this time which is good news. He seems to be showing signs of improvement both in regard to the upper and lower extremities currently. No fevers, chills, nausea, or vomiting noted at this time. 09/25/18 on evaluation today patient appears to be doing very well in regard to his multiple ulcers of the upper and lower extremities. He has been tolerating the dressing changes without complication. Fortunately there is no evidence of infection at this time. Overall very pleased with how things appear to be progressing. Several of the wounds have healed as of today. 10/07/18 on evaluation today patient appears to be doing rather well in regard to his wounds in general. In fact everything is almost completely healed he just has three locations where were still watching and ensuring that these are doing okay with some slight openings remaining. Fortunately there's no sign of infection overall which is good news. Electronic Signature(s) Signed: 10/12/2018 7:15:48 AM By: Lenda KelpStone III, Hoyt PA-C Entered By: Lenda KelpStone III, Hoyt on 10/07/2018 10:26:57 George Hensley, Philip F. ([id number]) -------------------------------------------------------------------------------- Physical Exam Details Patient Name: George Hensley, George F. Date of Service: 10/07/2018 9:30 AM Medical Record  Number: [id number] Patient Account Number: [id number] Date of Birth/Sex: Oct 02, 1930 (85 y.o. M) Treating RN: Curtis Sitesorthy, Joanna Primary Care Provider:  Bethann Punches Other Clinician: Referring Provider: Bethann Punches Treating Provider/Extender: Linwood Dibbles, HOYT Weeks in Treatment: 4 Constitutional Well-nourished and well-hydrated in no acute distress. Respiratory normal breathing without difficulty. clear to auscultation bilaterally. Cardiovascular regular rate and rhythm with normal S1, S2. Psychiatric this patient is able to make decisions and demonstrates good insight into disease process. Alert and Oriented x 3. pleasant and cooperative. Notes Patient's wounds currently are shown signs of good improvement and in fact he appears to be almost completely healed. I'm very pleased in this regard. Fortunately there's no evidence of infection at this time. Electronic Signature(s) Signed: 10/12/2018 7:15:48 AM By: Lenda Kelp PA-C Entered By: Lenda Kelp on 10/07/2018 10:27:39 George Paris ([id number]) -------------------------------------------------------------------------------- Physician Orders Details Patient Name: George Hensley. Date of Service: 10/07/2018 9:30 AM Medical Record Number: [id number] Patient Account Number: [id number] Date of Birth/Sex: [birth year] (85 y.o. M) Treating RN: Curtis Sites Primary Care Provider: Bethann Punches Other Clinician: Referring Provider: Bethann Punches Treating Provider/Extender: Linwood Dibbles, HOYT Weeks in Treatment: 4 Verbal / Phone Orders: No Diagnosis Coding ICD-10 Coding Code Description S51.002A Unspecified open wound of left elbow, initial encounter S51.802A Unspecified open wound of left forearm, initial encounter S61.502A Unspecified open wound of left wrist, initial encounter S61.402A Unspecified open wound of left hand, initial encounter S81.002A Unspecified open wound, left knee, initial encounter S81.001A Unspecified open  wound, right knee, initial encounter I10 Essential (primary) hypertension E11.622 Type 2 diabetes mellitus with other skin ulcer Wound Cleansing Wound #5 Left Hand - 5th Digit o Clean wound with Normal Saline. o May Shower, gently pat wound dry prior to applying new dressing. - Please use Dial antibacterial soap to wash your wounds Wound #7 Left Knee o Clean wound with Normal Saline. o May Shower, gently pat wound dry prior to applying new dressing. - Please use Dial antibacterial soap to wash your wounds Anesthetic (add to Medication List) Wound #5 Left Hand - 5th Digit o Topical Lidocaine 4% cream applied to wound bed prior to debridement (In Clinic Only). Wound #7 Left Knee o Topical Lidocaine 4% cream applied to wound bed prior to debridement (In Clinic Only). Primary Wound Dressing Wound #5 Left Hand - 5th Digit o Mupirocin Ointment Wound #3R Left,Distal Forearm o Mupirocin Ointment Wound #7 Left Knee o Mupirocin Ointment Secondary Dressing Wound #3R Left,Distal Forearm YADIR, ZENTNER ([id number]) o Other - coverlet or bandaid Wound #5 Left Hand - 5th Digit o Other - coverlet or bandaid Wound #7 Left Knee o Other - coverlet or bandaid Dressing Change Frequency Wound #3R Left,Distal Forearm o Change dressing every other day. Wound #5 Left Hand - 5th Digit o Change dressing every other day. Wound #7 Left Knee o Change dressing every other day. Follow-up Appointments Wound #3R Left,Distal Forearm o Return Appointment in 2 weeks. Wound #5 Left Hand - 5th Digit o Return Appointment in 2 weeks. Wound #7 Left Knee o Return Appointment in 2 weeks. Electronic Signature(s) Signed: 10/07/2018 4:52:44 PM By: Curtis Sites Signed: 10/12/2018 7:15:48 AM By: Lenda Kelp PA-C Entered By: Curtis Sites on 10/07/2018 10:20:43 George Paris  ([id number]) -------------------------------------------------------------------------------- Problem List Details Patient Name: SATHVIK, TIEDT. Date of Service: 10/07/2018 9:30 AM Medical Record Number: [id number] Patient Account Number: [id number] Date of Birth/Sex: [birth year] (85 y.o. M) Treating RN: Curtis Sites Primary Care Provider: Bethann Punches Other Clinician: Referring Provider: Bethann Punches Treating Provider/Extender: Linwood Dibbles, HOYT Weeks in Treatment: 4 Active Problems ICD-10 Evaluated Encounter Code Description Active Date  Today Diagnosis S51.002A Unspecified open wound of left elbow, initial encounter 09/04/2018 No Yes S51.802A Unspecified open wound of left forearm, initial encounter 09/04/2018 No Yes S61.502A Unspecified open wound of left wrist, initial encounter 09/04/2018 No Yes S61.402A Unspecified open wound of left hand, initial encounter 09/04/2018 No Yes S81.002A Unspecified open wound, left knee, initial encounter 09/04/2018 No Yes S81.001A Unspecified open wound, right knee, initial encounter 09/04/2018 No Yes I10 Essential (primary) hypertension 09/04/2018 No Yes E11.622 Type 2 diabetes mellitus with other skin ulcer 09/04/2018 No Yes Inactive Problems Resolved Problems Electronic Signature(s) Signed: 10/12/2018 7:15:48 AM By: Lenda Kelp PA-C Entered By: Lenda Kelp on 10/07/2018 09:45:10 MEREK, KASPARIAN ([id number]) JADYEL, RATTLER ([id number]) -------------------------------------------------------------------------------- Progress Note Details Patient Name: EDRIEL, SOM. Date of Service: 10/07/2018 9:30 AM Medical Record Number: [id number] Patient Account Number: [id number] Date of Birth/Sex: [birth year] (85 y.o. M) Treating RN: Curtis Sites Primary Care Provider: Bethann Punches Other Clinician: Referring Provider: Bethann Punches Treating Provider/Extender: Linwood Dibbles, HOYT Weeks in Treatment: 4 Subjective Chief Complaint Information obtained from  Patient Multiple Left UE and Bilateral LE Ulcers History of Present Illness (HPI) 09/04/18 on evaluation today patient actually appears to be doing somewhat poorly upon initial evaluation in regard to his left upper extremity in his bilateral knees. He unfortunately sustained a fall while he was at the beach this past Saturday 08/30/18. Nonetheless he was seen in the emergency department initially and then subsequently was seen by his primary care provider Dr. Hyacinth Meeker who referred him to [place] for further evaluation and treatment. Subsequently he was given a tetanus shot during the course of all this and also was placed on Flex 250 mg three times a day for 10 days by Dr. Hyacinth Meeker. Currently the patient has several wounds in varying stages of stability over the left upper extremity and the bilateral knee regions. He does have a history of diabetes mellitus type II and hypertension. 09/11/18 on evaluation today patient appears to be doing much better in regard to his multiple upper and lower Trinity ulcer's. All are measuring smaller today which is excellent news. Overall he has been tolerating the dressing changes although unfortunately he has not received his dressings. They been using the Bactroban ointment along with nonadherent pads they purchased at the pharmacy. 09/18/18 on evaluation today patient appears to be doing rather well at this point from the standpoint of his ulcers. Fortunately there does not appear to be any evidence of infection at this time which is good news. He seems to be showing signs of improvement both in regard to the upper and lower extremities currently. No fevers, chills, nausea, or vomiting noted at this time. 09/25/18 on evaluation today patient appears to be doing very well in regard to his multiple ulcers of the upper and lower extremities. He has been tolerating the dressing changes without complication. Fortunately there is no evidence of infection at this time. Overall  very pleased with how things appear to be progressing. Several of the wounds have healed as of today. 10/07/18 on evaluation today patient appears to be doing rather well in regard to his wounds in general. In fact everything is almost completely healed he just has three locations where were still watching and ensuring that these are doing okay with some slight openings remaining. Fortunately there's no sign of infection overall which is good news. Patient History Information obtained from Patient. Social History Former smoker - cigar, Marital Status - Married, Alcohol Use - Never, Drug Use -  No History, Caffeine Use - Daily. Medical History Respiratory Patient has history of Asthma Cardiovascular Patient has history of Hypertension Endocrine Patient has history of Type II Diabetes - 20 years oral meds Integumentary (Skin) SLAYDEN, MENNENGA ([id number]) Denies history of History of Burn, History of pressure wounds Medical And Surgical History Notes Eyes macular degeneration Cardiovascular mitral valve disorder 2000 stent placed Neurologic Stroke Review of Systems (ROS) Constitutional Symptoms (General Health) Denies complaints or symptoms of Fever, Chills. Respiratory The patient has no complaints or symptoms. Cardiovascular The patient has no complaints or symptoms. Psychiatric The patient has no complaints or symptoms. Objective Constitutional Well-nourished and well-hydrated in no acute distress. Vitals Time Taken: 9:33 AM, Height: 70 in, Weight: 160 lbs, BMI: 23, Temperature: 97.8 F, Pulse: 76 bpm, Respiratory Rate: 18 breaths/min, Blood Pressure: 124/67 mmHg. Respiratory normal breathing without difficulty. clear to auscultation bilaterally. Cardiovascular regular rate and rhythm with normal S1, S2. Psychiatric this patient is able to make decisions and demonstrates good insight into disease process. Alert and Oriented x 3. pleasant and cooperative. General Notes:  Patient's wounds currently are shown signs of good improvement and in fact he appears to be almost completely healed. I'm very pleased in this regard. Fortunately there's no evidence of infection at this time. Integumentary (Hair, Skin) Wound #3R status is Open. Original cause of wound was Trauma. The wound is located on the Left,Distal Forearm. The wound measures 0.1cm length x 0.1cm width x 0.1cm depth; 0.008cm^2 area and 0.001cm^3 volume. There is Fat Layer (Subcutaneous Tissue) Exposed exposed. There is no tunneling or undermining noted. There is a medium amount of serosanguineous drainage noted. The wound margin is flat and intact. There is large (67-100%) pink granulation within the wound bed. There is no necrotic tissue within the wound bed. The periwound skin appearance exhibited: Hemosiderin Staining. Periwound temperature was noted as No Abnormality. The periwound has tenderness on palpation. TAVORIS, BRISK ([id number]) Wound #4 status is Healed - Epithelialized. Original cause of wound was Trauma. The wound is located on the Left,Lateral Wrist. The wound measures 0cm length x 0cm width x 0cm depth; 0cm^2 area and 0cm^3 volume. There is Fat Layer (Subcutaneous Tissue) Exposed exposed. There is no tunneling or undermining noted. There is a medium amount of serosanguineous drainage noted. The wound margin is flat and intact. There is no granulation within the wound bed. There is a large (67-100%) amount of necrotic tissue within the wound bed including Eschar. The periwound skin appearance exhibited: Hemosiderin Staining. The periwound skin appearance did not exhibit: Callus, Crepitus, Excoriation, Induration, Rash, Scarring, Dry/Scaly, Maceration, Atrophie Blanche, Cyanosis, Ecchymosis, Mottled, Pallor, Rubor, Erythema. Periwound temperature was noted as No Abnormality. The periwound has tenderness on palpation. Wound #5 status is Open. Original cause of wound was Trauma. The wound is  located on the Left Hand - 5th Digit. The wound measures 0.1cm length x 0.1cm width x 0.1cm depth; 0.008cm^2 area and 0.001cm^3 volume. There is Fat Layer (Subcutaneous Tissue) Exposed exposed. There is no tunneling or undermining noted. There is a medium amount of serosanguineous drainage noted. The wound margin is flat and intact. There is no granulation within the wound bed. There is a large (67-100%) amount of necrotic tissue within the wound bed including Eschar. The periwound skin appearance did not exhibit: Callus, Crepitus, Excoriation, Induration, Rash, Scarring, Dry/Scaly, Maceration, Atrophie Blanche, Cyanosis, Ecchymosis, Hemosiderin Staining, Mottled, Pallor, Rubor, Erythema. Periwound temperature was noted as No Abnormality. The periwound has tenderness on palpation. Wound #7 status is  Open. Original cause of wound was Trauma. The wound is located on the Left Knee. The wound measures 0.5cm length x 3.4cm width x 0.1cm depth; 1.335cm^2 area and 0.134cm^3 volume. There is Fat Layer (Subcutaneous Tissue) Exposed exposed. There is no tunneling or undermining noted. There is a none present amount of drainage noted. The wound margin is flat and intact. There is no granulation within the wound bed. There is a large (67-100%) amount of necrotic tissue within the wound bed including Eschar and Adherent Slough. The periwound skin appearance exhibited: Erythema. The periwound skin appearance did not exhibit: Callus, Crepitus, Excoriation, Induration, Rash, Scarring, Dry/Scaly, Maceration, Atrophie Blanche, Cyanosis, Ecchymosis, Hemosiderin Staining, Mottled, Pallor, Rubor. The surrounding wound skin color is noted with erythema which is circumferential. Periwound temperature was noted as No Abnormality. The periwound has tenderness on palpation. Wound #9 status is Healed - Epithelialized. Original cause of wound was Trauma. The wound is located on the Left,Medial Wrist. The wound measures 0cm  length x 0cm width x 0cm depth; 0cm^2 area and 0cm^3 volume. There is Fat Layer (Subcutaneous Tissue) Exposed exposed. There is no tunneling or undermining noted. There is a medium amount of serous drainage noted. The wound margin is flat and intact. There is no granulation within the wound bed. There is a large (67-100%) amount of necrotic tissue within the wound bed. The periwound skin appearance did not exhibit: Callus, Crepitus, Excoriation, Induration, Rash, Scarring, Dry/Scaly, Maceration, Atrophie Blanche, Cyanosis, Ecchymosis, Hemosiderin Staining, Mottled, Pallor, Rubor, Erythema. Periwound temperature was noted as No Abnormality. Assessment Active Problems ICD-10 Unspecified open wound of left elbow, initial encounter Unspecified open wound of left forearm, initial encounter Unspecified open wound of left wrist, initial encounter Unspecified open wound of left hand, initial encounter Unspecified open wound, left knee, initial encounter Unspecified open wound, right knee, initial encounter Essential (primary) hypertension Type 2 diabetes mellitus with other skin ulcer TURKI, TAPANES ([id number]) Plan Wound Cleansing: Wound #5 Left Hand - 5th Digit: Clean wound with Normal Saline. May Shower, gently pat wound dry prior to applying new dressing. - Please use Dial antibacterial soap to wash your wounds Wound #7 Left Knee: Clean wound with Normal Saline. May Shower, gently pat wound dry prior to applying new dressing. - Please use Dial antibacterial soap to wash your wounds Anesthetic (add to Medication List): Wound #5 Left Hand - 5th Digit: Topical Lidocaine 4% cream applied to wound bed prior to debridement (In Clinic Only). Wound #7 Left Knee: Topical Lidocaine 4% cream applied to wound bed prior to debridement (In Clinic Only). Primary Wound Dressing: Wound #5 Left Hand - 5th Digit: Mupirocin Ointment Wound #3R Left,Distal Forearm: Mupirocin Ointment Wound #7 Left  Knee: Mupirocin Ointment Secondary Dressing: Wound #3R Left,Distal Forearm: Other - coverlet or bandaid Wound #5 Left Hand - 5th Digit: Other - coverlet or bandaid Wound #7 Left Knee: Other - coverlet or bandaid Dressing Change Frequency: Wound #3R Left,Distal Forearm: Change dressing every other day. Wound #5 Left Hand - 5th Digit: Change dressing every other day. Wound #7 Left Knee: Change dressing every other day. Follow-up Appointments: Wound #3R Left,Distal Forearm: Return Appointment in 2 weeks. Wound #5 Left Hand - 5th Digit: Return Appointment in 2 weeks. Wound #7 Left Knee: Return Appointment in 2 weeks. My suggestion at this point is gonna be that we go ahead and initiate the above wound care measures for the next week. The patient is in agreement with plan. We will subsequently see were things stand at  follow-up. Please see above for specific wound care orders. We will see patient for re-evaluation in 1 week(s) here in the clinic. If anything worsens or changes patient will contact our office for additional recommendations. Electronic Signature(s) Signed: 10/12/2018 7:15:48 AM By: Lenda Kelp PA-C Entered By: Lenda Kelp on 10/07/2018 10:27:52 CAIGE, ALMEDA ([id number], CARILLO ([id number]) -------------------------------------------------------------------------------- ROS/PFSH Details Patient Name: DONIS, PINDER. Date of Service: 10/07/2018 9:30 AM Medical Record Number: [id number] Patient Account Number: [id number] Date of Birth/Sex: 05-04-1931 (85 y.o. M) Treating RN: Curtis Sites Primary Care Provider: Bethann Punches Other Clinician: Referring Provider: Bethann Punches Treating Provider/Extender: Linwood Dibbles, HOYT Weeks in Treatment: 4 Information Obtained From Patient Wound History Do you currently have one or more open woundso Yes How many open wounds do you currently haveo 6 Approximately how long have you had your woundso 1 week How  have you been treating your wound(s) until nowo non adherant dressing, compression Has your wound(s) ever healed and then re-openedo No Have you had any lab work done in the past montho No Have you tested positive for an antibiotic resistant organism (MRSA, VRE)o No Have you tested positive for osteomyelitis (bone infection)o No Have you had any tests for circulation on your legso No Constitutional Symptoms (General Health) Complaints and Symptoms: Negative for: Fever; Chills Eyes Medical History: Past Medical History Notes: macular degeneration Respiratory Complaints and Symptoms: No Complaints or Symptoms Medical History: Positive for: Asthma Cardiovascular Complaints and Symptoms: No Complaints or Symptoms Medical History: Positive for: Hypertension Past Medical History Notes: mitral valve disorder 2000 stent placed Endocrine Medical History: Positive for: Type II Diabetes - 20 years oral meds Time with diabetes: 20 years Treated with: Oral agents MOMODOU, CONSIGLIO ([id number]) Blood sugar tested every day: No Integumentary (Skin) Medical History: Negative for: History of Burn; History of pressure wounds Neurologic Medical History: Past Medical History Notes: Stroke Psychiatric Complaints and Symptoms: No Complaints or Symptoms Immunizations Pneumococcal Vaccine: Received Pneumococcal Vaccination: Yes Implantable Devices Family and Social History Former smoker - cigar; Marital Status - Married; Alcohol Use: Never; Drug Use: No History; Caffeine Use: Daily; Financial Concerns: No; Food, Clothing or Shelter Needs: No; Support System Lacking: No; Transportation Concerns: No Physician Affirmation I have reviewed and agree with the above information. Electronic Signature(s) Signed: 10/07/2018 4:52:44 PM By: Curtis Sites Signed: 10/12/2018 7:15:48 AM By: Lenda Kelp PA-C Entered By: Lenda Kelp on 10/07/2018 10:27:20 George Paris  ([id number]) -------------------------------------------------------------------------------- SuperBill Details Patient Name: XABI, WITTLER. Date of Service: 10/07/2018 Medical Record Number: [id number] Patient Account Number: [id number] Date of Birth/Sex: April 14, 1931 (85 y.o. M) Treating RN: Curtis Sites Primary Care Provider: Bethann Punches Other Clinician: Referring Provider: Bethann Punches Treating Provider/Extender: Linwood Dibbles, HOYT Weeks in Treatment: 4 Diagnosis Coding ICD-10 Codes Code Description S51.002A Unspecified open wound of left elbow, initial encounter S51.802A Unspecified open wound of left forearm, initial encounter S61.502A Unspecified open wound of left wrist, initial encounter S61.402A Unspecified open wound of left hand, initial encounter S81.002A Unspecified open wound, left knee, initial encounter S81.001A Unspecified open wound, right knee, initial encounter I10 Essential (primary) hypertension E11.622 Type 2 diabetes mellitus with other skin ulcer Facility Procedures CPT4 Code: [id number] Description: 64332 - WOUND CARE VISIT-LEV 5 EST PT Modifier: Quantity: 1 Physician Procedures CPT4 Code: [id number] Description: 99214 - WC PHYS LEVEL 4 - EST PT ICD-10 Diagnosis Description S51.002A Unspecified open wound of left elbow, initial encounter S51.802A Unspecified open wound of left forearm, initial encounter Y60.630Z Unspecified  open wound of left wrist,  initial encounter S61.402A Unspecified open wound of left hand, initial encounter Modifier: Quantity: 1 Electronic Signature(s) Signed: 10/07/2018 4:52:44 PM By: Curtis Sites Signed: 10/12/2018 7:15:48 AM By: Lenda Kelp PA-C Entered By: Curtis Sites on 10/07/2018 10:34:58

## 2018-10-12 NOTE — Progress Notes (Signed)
George Hensley, George Hensley ([id number]) Visit Report for 10/07/2018 Arrival Information Details Patient Name: George Hensley, George Hensley. Date of Service: 10/07/2018 9:30 AM Medical Record Number: [id number] Patient Account Number: [id number] Date of Birth/Sex: 1931-03-12 (85 y.o. M) Treating RN: Curtis Sites Primary Care Killian Ress: Bethann Punches Other Clinician: Referring Kymora Sciara: Bethann Punches Treating Jonell Krontz/Extender: Linwood Dibbles, HOYT Weeks in Treatment: 4 Visit Information History Since Last Visit Added or deleted any medications: No Patient Arrived: Ambulatory Any new allergies or adverse reactions: No Arrival Time: 09:32 Had a fall or experienced change in No Accompanied By: self activities of daily living that may affect Transfer Assistance: None risk of falls: Patient Identification Verified: Yes Signs or symptoms of abuse/neglect since last visito No Secondary Verification Process Completed: Yes Hospitalized since last visit: No Implantable device outside of the clinic excluding No cellular tissue based products placed in the center since last visit: Pain Present Now: No Electronic Signature(s) Signed: 10/07/2018 3:52:51 PM By: Dayton Martes RCP, RRT, CHT Entered By: Dayton Martes on 10/07/2018 09:33:38 George Paris ([id number]) -------------------------------------------------------------------------------- Clinic Level of Care Assessment Details Patient Name: George Hensley, George Hensley. Date of Service: 10/07/2018 9:30 AM Medical Record Number: [id number] Patient Account Number: [id number] Date of Birth/Sex: [birth year] (85 y.o. M) Treating RN: Curtis Sites Primary Care Rolena Knutson: Bethann Punches Other Clinician: Referring Shakeila Pfarr: Bethann Punches Treating Beren Yniguez/Extender: Linwood Dibbles, HOYT Weeks in Treatment: 4 Clinic Level of Care Assessment Items TOOL 4 Quantity Score []  - Use when only an EandM is performed on FOLLOW-UP visit 0 ASSESSMENTS - Nursing Assessment  / Reassessment X - Reassessment of Co-morbidities (includes updates in patient status) 1 10 X- 1 5 Reassessment of Adherence to Treatment Plan ASSESSMENTS - Wound and Skin Assessment / Reassessment []  - Simple Wound Assessment / Reassessment - one wound 0 X- 5 5 Complex Wound Assessment / Reassessment - multiple wounds []  - 0 Dermatologic / Skin Assessment (not related to wound area) ASSESSMENTS - Focused Assessment []  - Circumferential Edema Measurements - multi extremities 0 []  - 0 Nutritional Assessment / Counseling / Intervention []  - 0 Lower Extremity Assessment (monofilament, tuning fork, pulses) []  - 0 Peripheral Arterial Disease Assessment (using hand held doppler) ASSESSMENTS - Ostomy and/or Continence Assessment and Care []  - Incontinence Assessment and Management 0 []  - 0 Ostomy Care Assessment and Management (repouching, etc.) PROCESS - Coordination of Care X - Simple Patient / Family Education for ongoing care 1 15 []  - 0 Complex (extensive) Patient / Family Education for ongoing care X- 1 10 Staff obtains Chiropractor, Records, Test Results / Process Orders []  - 0 Staff telephones HHA, Nursing Homes / Clarify orders / etc []  - 0 Routine Transfer to another Facility (non-emergent condition) []  - 0 Routine Hospital Admission (non-emergent condition) []  - 0 New Admissions / Manufacturing engineer / Ordering NPWT, Apligraf, etc. []  - 0 Emergency Hospital Admission (emergent condition) X- 1 10 Simple Discharge Coordination George Hensley, George Hensley ([id number]) []  - 0 Complex (extensive) Discharge Coordination PROCESS - Special Needs []  - Pediatric / Minor Patient Management 0 []  - 0 Isolation Patient Management []  - 0 Hearing / Language / Visual special needs []  - 0 Assessment of Community assistance (transportation, D/C planning, etc.) []  - 0 Additional assistance / Altered mentation []  - 0 Support Surface(s) Assessment (bed, cushion, seat, etc.) INTERVENTIONS -  Wound Cleansing / Measurement []  - Simple Wound Cleansing - one wound 0 X- 5 5 Complex Wound Cleansing - multiple wounds X- 1 5 Wound Imaging (photographs - any number  of wounds) []  - 0 Wound Tracing (instead of photographs) []  - 0 Simple Wound Measurement - one wound X- 5 5 Complex Wound Measurement - multiple wounds INTERVENTIONS - Wound Dressings X - Small Wound Dressing one or multiple wounds 3 10 []  - 0 Medium Wound Dressing one or multiple wounds []  - 0 Large Wound Dressing one or multiple wounds []  - 0 Application of Medications - topical []  - 0 Application of Medications - injection INTERVENTIONS - Miscellaneous []  - External ear exam 0 []  - 0 Specimen Collection (cultures, biopsies, blood, body fluids, etc.) []  - 0 Specimen(s) / Culture(s) sent or taken to Lab for analysis []  - 0 Patient Transfer (multiple staff / Nurse, adultHoyer Lift / Similar devices) []  - 0 Simple Staple / Suture removal (25 or less) []  - 0 Complex Staple / Suture removal (26 or more) []  - 0 Hypo / Hyperglycemic Management (close monitor of Blood Glucose) []  - 0 Ankle / Brachial Index (ABI) - do not check if billed separately X- 1 5 Vital Signs George Hensley, George F. ([id number]) Has the patient been seen at the hospital within the last three years: Yes Total Score: 165 Level Of Care: New/Established - Level 5 Electronic Signature(s) Signed: 10/07/2018 4:52:44 PM By: Curtis Sitesorthy, Joanna Entered By: Curtis Sitesorthy, Joanna on 10/07/2018 10:34:49 George Hensley, George F. ([id number]) -------------------------------------------------------------------------------- Encounter Discharge Information Details Patient Name: George Hensley, George F. Date of Service: 10/07/2018 9:30 AM Medical Record Number: [id number] Patient Account Number: [id number] Date of Birth/Sex: Apr 05, 1931 (85 y.o. M) Treating RN: Curtis Sitesorthy, Joanna Primary Care Iann Rodier: Bethann PunchesMiller, Mark Other Clinician: Referring Tari Lecount: Bethann PunchesMiller, Mark Treating Isiaha Greenup/Extender: Linwood DibblesSTONE  III, HOYT Weeks in Treatment: 4 Encounter Discharge Information Items Discharge Condition: Stable Ambulatory Status: Ambulatory Discharge Destination: Home Transportation: Private Auto Accompanied By: self Schedule Follow-up Appointment: Yes Clinical Summary of Care: Electronic Signature(s) Signed: 10/07/2018 4:52:44 PM By: Curtis Sitesorthy, Joanna Entered By: Curtis Sitesorthy, Joanna on 10/07/2018 10:37:44 George Hensley, Rihaan F. ([id number]) -------------------------------------------------------------------------------- Lower Extremity Assessment Details Patient Name: George Hensley, George F. Date of Service: 10/07/2018 9:30 AM Medical Record Number: [id number] Patient Account Number: [id number] Date of Birth/Sex: Apr 05, 1931 (85 y.o. M) Treating RN: Rodell PernaScott, Dajea Primary Care Jervis Trapani: Bethann PunchesMiller, Mark Other Clinician: Referring Kieren Ricci: Bethann PunchesMiller, Mark Treating Donette Mainwaring/Extender: Linwood DibblesSTONE III, HOYT Weeks in Treatment: 4 Edema Assessment Assessed: [Left: No] [Right: No] Edema: [Left: N] [Right: o] Vascular Assessment Pulses: Posterior Tibial Extremity colors, hair growth, and conditions: Extremity Color: [Left:Normal] Hair Growth on Extremity: [Left:No] Temperature of Extremity: [Left:Warm] Capillary Refill: [Left:< 3 seconds] Electronic Signature(s) Signed: 10/07/2018 4:35:39 PM By: Rodell PernaScott, Dajea Entered By: Rodell PernaScott, Dajea on 10/07/2018 09:55:51 George Hensley, George F. ([id number]) -------------------------------------------------------------------------------- Multi Wound Chart Details Patient Name: George Hensley, George F. Date of Service: 10/07/2018 9:30 AM Medical Record Number: [id number] Patient Account Number: [id number] Date of Birth/Sex: Apr 05, 1931 (85 y.o. M) Treating RN: Curtis Sitesorthy, Joanna Primary Care Ikeisha Blumberg: Bethann PunchesMiller, Mark Other Clinician: Referring Javon Hupfer: Bethann PunchesMiller, Mark Treating Lonell Stamos/Extender: Linwood DibblesSTONE III, HOYT Weeks in Treatment: 4 Vital Signs Height(in): 70 Pulse(bpm): 76 Weight(lbs): 160 Blood  Pressure(mmHg): 124/67 Body Mass Index(BMI): 23 Temperature(F): 97.8 Respiratory Rate 18 (breaths/min): Photos: [3R:No Photos] [4:No Photos] [5:No Photos] Wound Location: [3R:Left, Distal Forearm] [4:Left, Lateral Wrist] [5:Left Hand - 5th Digit] Wounding Event: [3R:Trauma] [4:Trauma] [5:Trauma] Primary Etiology: [3R:Trauma, Other] [4:Trauma, Other] [5:Trauma, Other] Comorbid History: [3R:Asthma, Hypertension, Type II Diabetes] [4:Asthma, Hypertension, Type II Diabetes] [5:Asthma, Hypertension, Type II Diabetes] Date Acquired: [3R:08/30/2018] [4:08/30/2018] [5:08/30/2018] Weeks of Treatment: [3R:4] [4:4] [5:4] Wound Status: [3R:Open] [4:Healed - Epithelialized] [5:Open] Wound Recurrence: [3R:Yes] [4:No] [5:No] Clustered Wound: [3R:No] [4:No] [5:No]  Clustered Quantity: [3R:N/A] [4:N/A] [5:N/A] Measurements L x W x D [3R:0.1x0.1x0.1] [4:0x0x0] [5:0.1x0.1x0.1] (cm) Area (cm) : [3R:0.008] [4:0] [5:0.008] Volume (cm) : [3R:0.001] [4:0] [5:0.001] % Reduction in Area: [3R:99.90%] [4:100.00%] [5:89.90%] % Reduction in Volume: [3R:99.90%] [4:100.00%] [5:87.50%] Classification: [3R:Full Thickness Without Exposed Support Structures] [4:Full Thickness Without Exposed Support Structures] [5:Partial Thickness] Exudate Amount: [3R:Medium] [4:Medium] [5:Medium] Exudate Type: [3R:Serosanguineous] [4:Serosanguineous] [5:Serosanguineous] Exudate Color: [3R:red, brown] [4:red, brown] [5:red, brown] Wound Margin: [3R:Flat and Intact] [4:Flat and Intact] [5:Flat and Intact] Granulation Amount: [3R:Large (67-100%)] [4:None Present (0%)] [5:None Present (0%)] Granulation Quality: [3R:Pink] [4:N/A] [5:N/A] Necrotic Amount: [3R:None Present (0%)] [4:Large (67-100%)] [5:Large (67-100%)] Necrotic Tissue: [3R:N/A] [4:Eschar] [5:Eschar] Exposed Structures: [3R:Fat Layer (Subcutaneous Tissue) Exposed: Yes Fascia: No Tendon: No Muscle: No Joint: No Bone: No] [4:Fat Layer (Subcutaneous Tissue) Exposed: Yes Fascia:  No Tendon: No Muscle: No Joint: No Bone: No] [5:Fat Layer (Subcutaneous Tissue) Exposed: Yes  Fascia: No Tendon: No Muscle: No Joint: No Bone: No] Epithelialization: [3R:Small (1-33%)] [4:Small (1-33%)] [5:Small (1-33%)] Periwound Skin Texture: [3R:No Abnormalities Noted] Excoriation: No Excoriation: No Induration: No Induration: No Callus: No Callus: No Crepitus: No Crepitus: No Rash: No Rash: No Scarring: No Scarring: No Periwound Skin Moisture: No Abnormalities Noted Maceration: No Maceration: No Dry/Scaly: No Dry/Scaly: No Periwound Skin Color: Hemosiderin Staining: Yes Hemosiderin Staining: Yes Atrophie Blanche: No Atrophie Blanche: No Cyanosis: No Cyanosis: No Ecchymosis: No Ecchymosis: No Erythema: No Erythema: No Hemosiderin Staining: No Mottled: No Mottled: No Pallor: No Pallor: No Rubor: No Rubor: No Erythema Location: N/A N/A N/A Temperature: No Abnormality No Abnormality No Abnormality Tenderness on Palpation: Yes Yes Yes Wound Preparation: Ulcer Cleansing: Ulcer Cleansing: Ulcer Cleansing: Rinsed/Irrigated with Saline Rinsed/Irrigated with Saline Rinsed/Irrigated with Saline Topical Anesthetic Applied: Topical Anesthetic Applied: Topical Anesthetic Applied: Other: lidocaine 4% None Other: lidocaine 4% Wound Number: [id number] N/A Photos: No Photos No Photos N/A Wound Location: Left Knee Left, Medial Wrist N/A Wounding Event: Trauma Trauma N/A Primary Etiology: Trauma, Other Skin Tear N/A Comorbid History: Asthma, Hypertension, Type II Asthma, Hypertension, Type II N/A Diabetes Diabetes Date Acquired: 08/30/2018 09/18/2018 N/A Weeks of Treatment: 4 2 N/A Wound Status: Open Healed - Epithelialized N/A Wound Recurrence: No No N/A Clustered Wound: Yes No N/A Clustered Quantity: 2 N/A N/A Measurements L x W x D 0.5x3.4x0.1 0x0x0 N/A (cm) Area (cm) : 1.335 0 N/A Volume (cm) : 0.134 0 N/A % Reduction in Area: 81.10% 100.00% N/A % Reduction in  Volume: 81.00% 100.00% N/A Classification: Full Thickness Without Full Thickness Without N/A Exposed Support Structures Exposed Support Structures Exudate Amount: None Present Medium N/A Exudate Type: N/A Serous N/A Exudate Color: N/A amber N/A Wound Margin: Flat and Intact Flat and Intact N/A Granulation Amount: None Present (0%) None Present (0%) N/A Granulation Quality: N/A N/A N/A Necrotic Amount: Large (67-100%) Large (67-100%) N/A Necrotic Tissue: Eschar, Adherent Slough N/A N/A Exposed Structures: Fat Layer (Subcutaneous Fat Layer (Subcutaneous N/A Tissue) Exposed: Yes Tissue) Exposed: Yes Fascia: No Fascia: No NICHOLA, WARREN ([id number]) Tendon: No Tendon: No Muscle: No Muscle: No Joint: No Joint: No Bone: No Bone: No Epithelialization: Small (1-33%) Small (1-33%) N/A Periwound Skin Texture: Excoriation: No Excoriation: No N/A Induration: No Induration: No Callus: No Callus: No Crepitus: No Crepitus: No Rash: No Rash: No Scarring: No Scarring: No Periwound Skin Moisture: Maceration: No Maceration: No N/A Dry/Scaly: No Dry/Scaly: No Periwound Skin Color: Erythema: Yes Atrophie Blanche: No N/A Atrophie Blanche: No Cyanosis: No Cyanosis: No Ecchymosis: No Ecchymosis: No Erythema: No Hemosiderin Staining:  No Hemosiderin Staining: No Mottled: No Mottled: No Pallor: No Pallor: No Rubor: No Rubor: No Erythema Location: Circumferential N/A N/A Temperature: No Abnormality No Abnormality N/A Tenderness on Palpation: Yes No N/A Wound Preparation: Ulcer Cleansing: Ulcer Cleansing: N/A Rinsed/Irrigated with Saline Rinsed/Irrigated with Saline Topical Anesthetic Applied: Topical Anesthetic Applied: Other: lidocaine 4% None Treatment Notes Electronic Signature(s) Signed: 10/07/2018 4:52:44 PM By: Curtis Sites Entered By: Curtis Sites on 10/07/2018 10:19:47 George Paris  ([id number]) -------------------------------------------------------------------------------- Multi-Disciplinary Care Plan Details Patient Name: George Hensley, George Hensley. Date of Service: 10/07/2018 9:30 AM Medical Record Number: [id number] Patient Account Number: [id number] Date of Birth/Sex: 1931-05-29 (85 y.o. M) Treating RN: Curtis Sites Primary Care Elainna Eshleman: Bethann Punches Other Clinician: Referring Adeleine Pask: Bethann Punches Treating Inge Waldroup/Extender: Linwood Dibbles, HOYT Weeks in Treatment: 4 Active Inactive Abuse / Safety / Falls / Self Care Management Nursing Diagnoses: History of Falls Goals: Patient will not experience any injury related to falls Date Initiated: 09/04/2018 Target Resolution Date: 11/29/2018 Goal Status: Active Interventions: Assess fall risk on admission and as needed Notes: Orientation to the Wound Care Program Nursing Diagnoses: Knowledge deficit related to the wound healing center program Goals: Patient/caregiver will verbalize understanding of the Wound Healing Center Program Date Initiated: 09/04/2018 Target Resolution Date: 11/29/2018 Goal Status: Active Interventions: Provide education on orientation to the wound center Notes: Wound/Skin Impairment Nursing Diagnoses: Impaired tissue integrity Goals: Ulcer/skin breakdown will heal within 14 weeks Date Initiated: 09/04/2018 Target Resolution Date: 11/29/2018 Goal Status: Active Interventions: Assess patient/caregiver ability to obtain necessary supplies JUNIOUS, RAGONE ([id number]) Assess patient/caregiver ability to perform ulcer/skin care regimen upon admission and as needed Assess ulceration(s) every visit Notes: Electronic Signature(s) Signed: 10/07/2018 4:52:44 PM By: Curtis Sites Entered By: Curtis Sites on 10/07/2018 10:18:14 George Paris ([id number]) -------------------------------------------------------------------------------- Pain Assessment Details Patient Name: George Paris. Date of  Service: 10/07/2018 9:30 AM Medical Record Number: [id number] Patient Account Number: [id number] Date of Birth/Sex: 1931-05-22 (85 y.o. M) Treating RN: Curtis Sites Primary Care Monty Spicher: Bethann Punches Other Clinician: Referring Sean Malinowski: Bethann Punches Treating Melinda Pottinger/Extender: Linwood Dibbles, HOYT Weeks in Treatment: 4 Active Problems Location of Pain Severity and Description of Pain Patient Has Paino No Site Locations Pain Management and Medication Current Pain Management: Electronic Signature(s) Signed: 10/07/2018 3:52:51 PM By: Sallee Provencal, RRT, CHT Signed: 10/07/2018 4:52:44 PM By: Curtis Sites Entered By: Dayton Martes on 10/07/2018 09:33:46 George Paris ([id number]) -------------------------------------------------------------------------------- Patient/Caregiver Education Details Patient Name: George Hensley, George Hensley. Date of Service: 10/07/2018 9:30 AM Medical Record Number: [id number] Patient Account Number: [id number] Date of Birth/Gender: 1931-06-16 (85 y.o. M) Treating RN: Curtis Sites Primary Care Physician: Bethann Punches Other Clinician: Referring Physician: Bethann Punches Treating Physician/Extender: Skeet Simmer in Treatment: 4 Education Assessment Education Provided To: Patient Education Topics Provided Wound/Skin Impairment: Handouts: Other: wound care as ordered Methods: Demonstration, Explain/Verbal Responses: State content correctly Electronic Signature(s) Signed: 10/07/2018 4:52:44 PM By: Curtis Sites Entered By: Curtis Sites on 10/07/2018 10:37:58 George Paris ([id number]) -------------------------------------------------------------------------------- Wound Assessment Details Patient Name: George Hensley, George Hensley. Date of Service: 10/07/2018 9:30 AM Medical Record Number: [id number] Patient Account Number: [id number] Date of Birth/Sex: 10/18/1930 (85 y.o. M) Treating RN: Curtis Sites Primary Care Eladia Frame:  Bethann Punches Other Clinician: Referring Kristoph Sattler: Bethann Punches Treating Boy Delamater/Extender: Linwood Dibbles, HOYT Weeks in Treatment: 4 Wound Status Wound Number: 3R Primary Etiology: Trauma, Other Wound Location: Left, Distal Forearm Wound Status: Open Wounding Event: Trauma Comorbid History: Asthma, Hypertension, Type II Diabetes Date Acquired: 08/30/2018 Weeks Of Treatment: 4  Clustered Wound: No Photos Photo Uploaded By: Rodell Perna on 10/07/2018 10:25:11 Wound Measurements Length: (cm) 0.1 Width: (cm) 0.1 Depth: (cm) 0.1 Area: (cm) 0.008 Volume: (cm) 0.001 % Reduction in Area: 99.9% % Reduction in Volume: 99.9% Epithelialization: Small (1-33%) Tunneling: No Undermining: No Wound Description Full Thickness Without Exposed Support Foul Odo Classification: Structures Slough/F Wound Margin: Flat and Intact Exudate Medium Amount: Exudate Type: Serosanguineous Exudate Color: red, brown r After Cleansing: No ibrino No Wound Bed Granulation Amount: Large (67-100%) Exposed Structure Granulation Quality: Pink Fascia Exposed: No Necrotic Amount: None Present (0%) Fat Layer (Subcutaneous Tissue) Exposed: Yes Tendon Exposed: No Muscle Exposed: No Joint Exposed: No Bone Exposed: No George Hensley, George Hensley ([id number]) Periwound Skin Texture Texture Color No Abnormalities Noted: No No Abnormalities Noted: No Hemosiderin Staining: Yes Moisture No Abnormalities Noted: No Temperature / Pain Temperature: No Abnormality Tenderness on Palpation: Yes Wound Preparation Ulcer Cleansing: Rinsed/Irrigated with Saline Topical Anesthetic Applied: Other: lidocaine 4%, Treatment Notes Wound #3R (Left, Distal Forearm) Notes bactroban L arm - non adherent pad,conform and netting; knee and hand - coverlet Electronic Signature(s) Signed: 10/07/2018 4:52:44 PM By: Curtis Sites Entered By: Curtis Sites on 10/07/2018 10:18:37 George Paris  ([id number]) -------------------------------------------------------------------------------- Wound Assessment Details Patient Name: George Paris. Date of Service: 10/07/2018 9:30 AM Medical Record Number: [id number] Patient Account Number: [id number] Date of Birth/Sex: 1931/01/31 (85 y.o. M) Treating RN: Curtis Sites Primary Care Fannye Myer: Bethann Punches Other Clinician: Referring Elexius Minar: Bethann Punches Treating Phinneas Shakoor/Extender: Linwood Dibbles, HOYT Weeks in Treatment: 4 Wound Status Wound Number: 4 Primary Etiology: Trauma, Other Wound Location: Left, Lateral Wrist Wound Status: Healed - Epithelialized Wounding Event: Trauma Comorbid History: Asthma, Hypertension, Type II Diabetes Date Acquired: 08/30/2018 Weeks Of Treatment: 4 Clustered Wound: No Photos Photo Uploaded By: Rodell Perna on 10/07/2018 10:25:12 Wound Measurements Length: (cm) 0 % Reduct Width: (cm) 0 % Reduct Depth: (cm) 0 Epitheli Area: (cm) 0 Tunneli Volume: (cm) 0 Undermi ion in Area: 100% ion in Volume: 100% alization: Small (1-33%) ng: No ning: No Wound Description Full Thickness Without Exposed Support Foul Odo Classification: Structures Slough/F Wound Margin: Flat and Intact Exudate Medium Amount: Exudate Type: Serosanguineous Exudate Color: red, brown r After Cleansing: No ibrino No Wound Bed Granulation Amount: None Present (0%) Exposed Structure Necrotic Amount: Large (67-100%) Fascia Exposed: No Necrotic Quality: Eschar Fat Layer (Subcutaneous Tissue) Exposed: Yes Tendon Exposed: No Muscle Exposed: No Joint Exposed: No Bone Exposed: No George Hensley, George Hensley ([id number]) Periwound Skin Texture Texture Color No Abnormalities Noted: No No Abnormalities Noted: No Callus: No Atrophie Blanche: No Crepitus: No Cyanosis: No Excoriation: No Ecchymosis: No Induration: No Erythema: No Rash: No Hemosiderin Staining: Yes Scarring: No Mottled: No Pallor: No Moisture Rubor: No No  Abnormalities Noted: No Dry / Scaly: No Temperature / Pain Maceration: No Temperature: No Abnormality Tenderness on Palpation: Yes Wound Preparation Ulcer Cleansing: Rinsed/Irrigated with Saline Topical Anesthetic Applied: None Electronic Signature(s) Signed: 10/07/2018 4:52:44 PM By: Curtis Sites Entered By: Curtis Sites on 10/07/2018 10:19:25 George Paris ([id number]) -------------------------------------------------------------------------------- Wound Assessment Details Patient Name: George Paris. Date of Service: 10/07/2018 9:30 AM Medical Record Number: [id number] Patient Account Number: [id number] Date of Birth/Sex: [birth year] (85 y.o. M) Treating RN: Rodell Perna Primary Care Eldrige Pitkin: Bethann Punches Other Clinician: Referring Jettson Crable: Bethann Punches Treating Elara Cocke/Extender: Linwood Dibbles, HOYT Weeks in Treatment: 4 Wound Status Wound Number: 5 Primary Etiology: Trauma, Other Wound Location: Left Hand - 5th Digit Wound Status: Open Wounding Event: Trauma Comorbid History: Asthma, Hypertension, Type II  Diabetes Date Acquired: 08/30/2018 Weeks Of Treatment: 4 Clustered Wound: No Photos Photo Uploaded By: Rodell Perna on 10/07/2018 10:26:17 Wound Measurements Length: (cm) 0.1 Width: (cm) 0.1 Depth: (cm) 0.1 Area: (cm) 0.008 Volume: (cm) 0.001 % Reduction in Area: 89.9% % Reduction in Volume: 87.5% Epithelialization: Small (1-33%) Tunneling: No Undermining: No Wound Description Classification: Partial Thickness Wound Margin: Flat and Intact Exudate Amount: Medium Exudate Type: Serosanguineous Exudate Color: red, brown Foul Odor After Cleansing: No Slough/Fibrino No Wound Bed Granulation Amount: None Present (0%) Exposed Structure Necrotic Amount: Large (67-100%) Fascia Exposed: No Necrotic Quality: Eschar Fat Layer (Subcutaneous Tissue) Exposed: Yes Tendon Exposed: No Muscle Exposed: No Joint Exposed: No Bone Exposed: No Periwound Skin  Texture TORINO, GORDEN ([id number]) Texture Color No Abnormalities Noted: No No Abnormalities Noted: No Callus: No Atrophie Blanche: No Crepitus: No Cyanosis: No Excoriation: No Ecchymosis: No Induration: No Erythema: No Rash: No Hemosiderin Staining: No Scarring: No Mottled: No Pallor: No Moisture Rubor: No No Abnormalities Noted: No Dry / Scaly: No Temperature / Pain Maceration: No Temperature: No Abnormality Tenderness on Palpation: Yes Wound Preparation Ulcer Cleansing: Rinsed/Irrigated with Saline Topical Anesthetic Applied: Other: lidocaine 4%, Treatment Notes Wound #5 (Left Hand - 5th Digit) Notes bactroban L arm - non adherent pad,conform and netting; knee and hand - coverlet Electronic Signature(s) Signed: 10/07/2018 4:35:39 PM By: Rodell Perna Entered By: Rodell Perna on 10/07/2018 09:54:34 George Paris ([id number]) -------------------------------------------------------------------------------- Wound Assessment Details Patient Name: George Paris. Date of Service: 10/07/2018 9:30 AM Medical Record Number: [id number] Patient Account Number: [id number] Date of Birth/Sex: 09-25-1930 (85 y.o. M) Treating RN: Rodell Perna Primary Care Emrik Erhard: Bethann Punches Other Clinician: Referring Nikita Surman: Bethann Punches Treating Aysa Larivee/Extender: Linwood Dibbles, HOYT Weeks in Treatment: 4 Wound Status Wound Number: 7 Primary Etiology: Trauma, Other Wound Location: Left Knee Wound Status: Open Wounding Event: Trauma Comorbid History: Asthma, Hypertension, Type II Diabetes Date Acquired: 08/30/2018 Weeks Of Treatment: 4 Clustered Wound: Yes Photos Photo Uploaded By: Rodell Perna on 10/07/2018 10:26:18 Wound Measurements Length: (cm) 0.5 Width: (cm) 3.4 Depth: (cm) 0.1 Clustered Quantity: 2 Area: (cm) 1.335 Volume: (cm) 0.134 % Reduction in Area: 81.1% % Reduction in Volume: 81% Epithelialization: Small (1-33%) Tunneling: No Undermining: No Wound  Description Full Thickness Without Exposed Support Classification: Structures Wound Margin: Flat and Intact Exudate None Present Amount: Foul Odor After Cleansing: No Slough/Fibrino No Wound Bed Granulation Amount: None Present (0%) Exposed Structure Necrotic Amount: Large (67-100%) Fascia Exposed: No Necrotic Quality: Eschar, Adherent Slough Fat Layer (Subcutaneous Tissue) Exposed: Yes Tendon Exposed: No Muscle Exposed: No Joint Exposed: No Bone Exposed: No Periwound Skin Texture HARSHA, ALCIVAR ([id number]) Texture Color No Abnormalities Noted: No No Abnormalities Noted: No Callus: No Atrophie Blanche: No Crepitus: No Cyanosis: No Excoriation: No Ecchymosis: No Induration: No Erythema: Yes Rash: No Erythema Location: Circumferential Scarring: No Hemosiderin Staining: No Mottled: No Moisture Pallor: No No Abnormalities Noted: No Rubor: No Dry / Scaly: No Maceration: No Temperature / Pain Temperature: No Abnormality Tenderness on Palpation: Yes Wound Preparation Ulcer Cleansing: Rinsed/Irrigated with Saline Topical Anesthetic Applied: Other: lidocaine 4%, Treatment Notes Wound #7 (Left Knee) Notes bactroban L arm - non adherent pad,conform and netting; knee and hand - coverlet Electronic Signature(s) Signed: 10/07/2018 4:35:39 PM By: Rodell Perna Entered By: Rodell Perna on 10/07/2018 09:54:59 George Paris ([id number]) -------------------------------------------------------------------------------- Wound Assessment Details Patient Name: KAHIL, HEIS. Date of Service: 10/07/2018 9:30 AM Medical Record Number: [id number] Patient Account Number: [id number] Date of Birth/Sex: 17-May-1931 (85 y.o. M)  Treating RN: Curtis Sitesorthy, Joanna Primary Care Daziyah Cogan: Bethann PunchesMiller, Mark Other Clinician: Referring Reginold Beale: Bethann PunchesMiller, Mark Treating Simon Llamas/Extender: Linwood DibblesSTONE III, HOYT Weeks in Treatment: 4 Wound Status Wound Number: 9 Primary Etiology: Skin Tear Wound  Location: Left, Medial Wrist Wound Status: Healed - Epithelialized Wounding Event: Trauma Comorbid History: Asthma, Hypertension, Type II Diabetes Date Acquired: 09/18/2018 Weeks Of Treatment: 2 Clustered Wound: No Photos Photo Uploaded By: Rodell PernaScott, Dajea on 10/07/2018 10:28:26 Wound Measurements Length: (cm) 0 % Reduct Width: (cm) 0 % Reduct Depth: (cm) 0 Epitheli Area: (cm) 0 Tunneli Volume: (cm) 0 Undermi ion in Area: 100% ion in Volume: 100% alization: Small (1-33%) ng: No ning: No Wound Description Full Thickness Without Exposed Support Foul Odo Classification: Structures Slough/F Wound Margin: Flat and Intact Exudate Medium Amount: Exudate Type: Serous Exudate Color: amber r After Cleansing: No ibrino No Wound Bed Granulation Amount: None Present (0%) Exposed Structure Necrotic Amount: Large (67-100%) Fascia Exposed: No Fat Layer (Subcutaneous Tissue) Exposed: Yes Tendon Exposed: No Muscle Exposed: No Joint Exposed: No Bone Exposed: No George Hensley, Zamarian F. ([id number]) Periwound Skin Texture Texture Color No Abnormalities Noted: No No Abnormalities Noted: No Callus: No Atrophie Blanche: No Crepitus: No Cyanosis: No Excoriation: No Ecchymosis: No Induration: No Erythema: No Rash: No Hemosiderin Staining: No Scarring: No Mottled: No Pallor: No Moisture Rubor: No No Abnormalities Noted: No Dry / Scaly: No Temperature / Pain Maceration: No Temperature: No Abnormality Wound Preparation Ulcer Cleansing: Rinsed/Irrigated with Saline Topical Anesthetic Applied: None Electronic Signature(s) Signed: 10/07/2018 4:52:44 PM By: Curtis Sitesorthy, Joanna Entered By: Curtis Sitesorthy, Joanna on 10/07/2018 10:19:25 George Hensley, Earle F. ([id number]) -------------------------------------------------------------------------------- Vitals Details Patient Name: George Hensley, Nimai F. Date of Service: 10/07/2018 9:30 AM Medical Record Number: [id number] Patient Account Number:  [id number] Date of Birth/Sex: 1930-10-09 (85 y.o. M) Treating RN: Curtis Sitesorthy, Joanna Primary Care Aldrich Lloyd: Bethann PunchesMiller, Mark Other Clinician: Referring Clinton Wahlberg: Bethann PunchesMiller, Mark Treating Mizani Dilday/Extender: Linwood DibblesSTONE III, HOYT Weeks in Treatment: 4 Vital Signs Time Taken: 09:33 Temperature (F): 97.8 Height (in): 70 Pulse (bpm): 76 Weight (lbs): 160 Respiratory Rate (breaths/min): 18 Body Mass Index (BMI): 23 Blood Pressure (mmHg): 124/67 Reference Range: 80 - 120 mg / dl Airway Electronic Signature(s) Signed: 10/07/2018 3:52:51 PM By: Dayton MartesWallace, RCP,RRT,CHT, Sallie RCP, RRT, CHT Entered By: Dayton MartesWallace, RCP,RRT,CHT, Sallie on 10/07/2018 09:35:49

## 2018-10-16 DIAGNOSIS — J4540 Moderate persistent asthma, uncomplicated: Secondary | ICD-10-CM | POA: Diagnosis not present

## 2018-10-16 DIAGNOSIS — J471 Bronchiectasis with (acute) exacerbation: Secondary | ICD-10-CM | POA: Diagnosis not present

## 2018-10-16 DIAGNOSIS — J439 Emphysema, unspecified: Secondary | ICD-10-CM | POA: Diagnosis not present

## 2018-10-21 ENCOUNTER — Encounter: Payer: Medicare Other | Admitting: Physician Assistant

## 2018-10-21 DIAGNOSIS — Z87891 Personal history of nicotine dependence: Secondary | ICD-10-CM | POA: Diagnosis not present

## 2018-10-21 DIAGNOSIS — L98492 Non-pressure chronic ulcer of skin of other sites with fat layer exposed: Secondary | ICD-10-CM | POA: Diagnosis not present

## 2018-10-21 DIAGNOSIS — L97822 Non-pressure chronic ulcer of other part of left lower leg with fat layer exposed: Secondary | ICD-10-CM | POA: Diagnosis not present

## 2018-10-21 DIAGNOSIS — I10 Essential (primary) hypertension: Secondary | ICD-10-CM | POA: Diagnosis not present

## 2018-10-21 DIAGNOSIS — Z8673 Personal history of transient ischemic attack (TIA), and cerebral infarction without residual deficits: Secondary | ICD-10-CM | POA: Diagnosis not present

## 2018-10-21 DIAGNOSIS — E11622 Type 2 diabetes mellitus with other skin ulcer: Secondary | ICD-10-CM | POA: Diagnosis not present

## 2018-10-22 NOTE — Progress Notes (Signed)
George Hensley, George F. ([id number]) Visit Report for 10/21/2018 Chief Complaint Document Details Patient Name: George Hensley, George F. Date of Service: 10/21/2018 8:15 AM Medical Record Number: [id number] Patient Account Number: [id number] Date of Birth/Sex: Sep 28, 1930 [age] y.o. Male) Treating RN: George Hensley Primary Care Provider: Bethann PunchesMiller, Mark Other Clinician: Referring Provider: Bethann PunchesMiller, Mark Treating Provider/Extender: George Hensley, George Hensley: 6 Information Obtained from: Patient Chief Complaint Multiple Left UE and Bilateral LE Ulcers Electronic Signature(s) Signed: 10/21/2018 9:39:30 AM By: Lenda KelpStone Hensley, George Hensley Entered By: Lenda KelpStone Hensley, George Hensley on 10/21/2018 08:42:52 George Hensley, George F. ([id number]) -------------------------------------------------------------------------------- HPI Details Patient Name: George Hensley, George F. Date of Service: 10/21/2018 8:15 AM Medical Record Number: [id number] Patient Account Number: [id number] Date of Birth/Sex: Sep 28, 1930 [age] y.o. Male) Treating RN: George Hensley Primary Care Provider: Bethann PunchesMiller, Mark Other Clinician: Referring Provider: Bethann PunchesMiller, Mark Treating Provider/Extender: George Hensley, George Hensley: 6 History of Present Illness HPI Description: 09/04/18 on evaluation today patient actually appears to be doing somewhat poorly upon initial evaluation in regard to his left upper extremity in his bilateral knees. He unfortunately sustained a fall [place] 08[place]/18. Nonetheless he was seen in the emergency department initially and then subsequently was seen by his primary care provider Dr. Hyacinth MeekerMiller who referred him to us for further evaluation and Hensley. Subsequently he was given a tetanus shot during the course of all this and also was placed on Flex 250 mg three times a day for 10 days by Dr. Hyacinth MeekerMiller. Currently the patient has several wounds in varying stages of stability over the left upper extremity and the  bilateral knee regions. He does have a history of diabetes mellitus type II and hypertension. 09/11/18 on evaluation today patient appears to be doing much better in regard to his multiple upper and lower Trinity ulcer's. All are measuring smaller today which is excellent news. Overall he has been tolerating the dressing changes although unfortunately he has not received his dressings. They been using the Bactroban ointment along with nonadherent pads they purchased at the pharmacy. 09/18/18 on evaluation today patient appears to be doing rather well at this point from the standpoint of his ulcers. Fortunately there does not appear to be any evidence of infection at this time which is good news. He seems to be showing signs of improvement both in regard to the upper and lower extremities currently. No fevers, chills, nausea, or vomiting noted at this time. 09/25/18 on evaluation today patient appears to be doing very well in regard to his multiple ulcers of the upper and lower extremities. He has been tolerating the dressing changes without complication. Fortunately there is no evidence of infection at this time. Overall very pleased with how things appear to be progressing. Several of the wounds have healed as of today. 10/07/18 on evaluation today patient appears to be doing rather well in regard to his wounds in general. In fact everything is almost completely healed he just has three locations where were still watching and ensuring that these are doing okay with some slight openings remaining. Fortunately there's no sign of infection overall which is good news. 10/21/18 on evaluation today patient appears to be doing very well in regard to his multiple upper and lower extremity ulcers. In fact everything appears to be completely healed at this point. This is excellent news. There's no signs of any new injuries today which is excellent as well. Electronic Signature(s) Signed: 10/21/2018 9:39:30 AM  By: Lenda KelpStone Hensley, George Hensley  Entered By: Lenda Kelp on 10/21/2018 09:23:01 George Hensley ([id number]) -------------------------------------------------------------------------------- Physical Exam Details Patient Name: George Hensley, George Hensley. Date of Service: 10/21/2018 8:15 AM Medical Record Number: [id number] Patient Account Number: [id number] Date of Birth/Sex: Jan 05, 1931 (85 y.o. Male) Treating RN: George Sites Primary Care Provider: Bethann Punches Other Clinician: Referring Provider: Bethann Punches Treating Provider/Extender: George Hensley, George Hensley: 6 Constitutional Well-nourished and well-hydrated in no acute distress. Respiratory normal breathing without difficulty. Psychiatric this patient is able to make decisions and demonstrates good insight into disease process. Alert and Oriented x 3. pleasant and cooperative. Notes Patient's wounds all appear to show complete epithelialization which is excellent news. Overall this is great news and I'm very pleased that he seems to be doing so well. Electronic Signature(s) Signed: 10/21/2018 9:39:30 AM By: Lenda Kelp Hensley Entered By: Lenda Kelp on 10/21/2018 09:23:48 Hensley, George ([id number]) -------------------------------------------------------------------------------- Physician Orders Details Patient Name: George Hensley, AGE. Date of Service: 10/21/2018 8:15 AM Medical Record Number: [id number] Patient Account Number: [id number] Date of Birth/Sex: Aug 07, 1931 (85 y.o. Male) Treating RN: George Sites Primary Care Provider: Bethann Punches Other Clinician: Referring Provider: Bethann Punches Treating Provider/Extender: George Dibbles, George Hensley: 6 Verbal / Phone Orders: No Diagnosis Coding ICD-10 Coding Code Description S51.002A Unspecified open wound of left elbow, initial encounter S51.802A Unspecified open wound of left forearm, initial encounter S61.502A Unspecified open wound of left wrist, initial  encounter S61.402A Unspecified open wound of left hand, initial encounter S81.002A Unspecified open wound, left knee, initial encounter S81.001A Unspecified open wound, right knee, initial encounter I10 Essential (primary) hypertension E11.622 Type 2 diabetes mellitus with other skin ulcer Discharge From [facility] Services o Discharge from Wound Care Center Electronic Signature(s) Signed: 10/21/2018 9:39:30 AM By: Lenda Kelp Hensley Signed: 10/21/2018 5:30:34 PM By: George Sites Entered By: George Sites on 10/21/2018 08:45:16 George Hensley ([id number]) -------------------------------------------------------------------------------- Problem List Details Patient Name: George Hensley, SUNGA. Date of Service: 10/21/2018 8:15 AM Medical Record Number: [id number] Patient Account Number: [id number] Date of Birth/Sex: 11-28-1930 (85 y.o. Male) Treating RN: George Sites Primary Care Provider: Bethann Punches Other Clinician: Referring Provider: Bethann Punches Treating Provider/Extender: George Dibbles, George Hensley: 6 Active Problems ICD-10 Evaluated Encounter Code Description Active Date Today Diagnosis S51.002A Unspecified open wound of left elbow, initial encounter 09/04/2018 No Yes S51.802A Unspecified open wound of left forearm, initial encounter 09/04/2018 No Yes S61.502A Unspecified open wound of left wrist, initial encounter 09/04/2018 No Yes S61.402A Unspecified open wound of left hand, initial encounter 09/04/2018 No Yes S81.002A Unspecified open wound, left knee, initial encounter 09/04/2018 No Yes S81.001A Unspecified open wound, right knee, initial encounter 09/04/2018 No Yes I10 Essential (primary) hypertension 09/04/2018 No Yes E11.622 Type 2 diabetes mellitus with other skin ulcer 09/04/2018 No Yes Inactive Problems Resolved Problems Electronic Signature(s) Signed: 10/21/2018 9:39:30 AM By: Lenda Kelp Hensley Entered By: Lenda Kelp on 10/21/2018 08:42:38 GERMAIN, KOOPMANN  ([id number], CORONADO ([id number]) -------------------------------------------------------------------------------- Progress Note Details Patient Name: George Hensley. Date of Service: 10/21/2018 8:15 AM Medical Record Number: [id number] Patient Account Number: [id number] Date of Birth/Sex: 21-Aug-1931 (85 y.o. Male) Treating RN: George Sites Primary Care Provider: Bethann Punches Other Clinician: Referring Provider: Bethann Punches Treating Provider/Extender: George Dibbles, George Hensley: 6 Subjective Chief Complaint Information obtained from Patient Multiple Left UE and Bilateral LE Ulcers History of Present Illness (HPI) 09/04/18 on evaluation today patient actually appears to be doing somewhat poorly upon initial evaluation in  regard to his left upper extremity in his bilateral knees. He unfortunately sustained a fall while he was at the beach this past Saturday 08/30/18. Nonetheless he was seen in the emergency department initially and then subsequently was seen by his primary care provider Dr. Hyacinth Meeker who referred him to [place] for further evaluation and Hensley. Subsequently he was given a tetanus shot during the course of all this and also was placed on Flex 250 mg three times a day for 10 days by Dr. Hyacinth Meeker. Currently the patient has several wounds in varying stages of stability over the left upper extremity and the bilateral knee regions. He does have a history of diabetes mellitus type II and hypertension. 09/11/18 on evaluation today patient appears to be doing much better in regard to his multiple upper and lower Trinity ulcer's. All are measuring smaller today which is excellent news. Overall he has been tolerating the dressing changes although unfortunately he has not received his dressings. They been using the Bactroban ointment along with nonadherent pads they purchased at the pharmacy. 09/18/18 on evaluation today patient appears to be doing rather well at this point  from the standpoint of his ulcers. Fortunately there does not appear to be any evidence of infection at this time which is good news. He seems to be showing signs of improvement both in regard to the upper and lower extremities currently. No fevers, chills, nausea, or vomiting noted at this time. 09/25/18 on evaluation today patient appears to be doing very well in regard to his multiple ulcers of the upper and lower extremities. He has been tolerating the dressing changes without complication. Fortunately there is no evidence of infection at this time. Overall very pleased with how things appear to be progressing. Several of the wounds have healed as of today. 10/07/18 on evaluation today patient appears to be doing rather well in regard to his wounds in general. In fact everything is almost completely healed he just has three locations where were still watching and ensuring that these are doing okay with some slight openings remaining. Fortunately there's no sign of infection overall which is good news. 10/21/18 on evaluation today patient appears to be doing very well in regard to his multiple upper and lower extremity ulcers. In fact everything appears to be completely healed at this point. This is excellent news. There's no signs of any new injuries today which is excellent as well. Patient History Information obtained from Patient. Social History Former smoker - cigar, Marital Status - Married, Alcohol Use - Never, Drug Use - No History, Caffeine Use - Daily. Medical History Respiratory Patient has history of Asthma Cardiovascular LEHI, PHIFER ([id number]) Patient has history of Hypertension Endocrine Patient has history of Type II Diabetes - 20 years oral meds Integumentary (Skin) Denies history of History of Burn, History of pressure wounds Medical And Surgical History Notes Eyes macular degeneration Cardiovascular mitral valve disorder 2000 stent  placed Neurologic Stroke Review of Systems (ROS) Constitutional Symptoms (General Health) Denies complaints or symptoms of Fever, Chills. Respiratory The patient has no complaints or symptoms. Cardiovascular The patient has no complaints or symptoms. Psychiatric The patient has no complaints or symptoms. Objective Constitutional Well-nourished and well-hydrated in no acute distress. Vitals Time Taken: 8:15 AM, Height: 70 in, Weight: 160 lbs, BMI: 23, Temperature: 98.5 F, Pulse: 73 bpm, Respiratory Rate: 16 breaths/min, Blood Pressure: 136/68 mmHg. Respiratory normal breathing without difficulty. Psychiatric this patient is able to make decisions and demonstrates good insight into disease process.  Alert and Oriented x 3. pleasant and cooperative. General Notes: Patient's wounds all appear to show complete epithelialization which is excellent news. Overall this is great news and I'm very pleased that he seems to be doing so well. Integumentary (Hair, Skin) Wound #3R status is Healed - Epithelialized. Original cause of wound was Trauma. The wound is located on the Left,Distal Forearm. The wound measures 0cm length x 0cm width x 0cm depth; 0cm^2 area and 0cm^3 volume. There is Fat Layer (Subcutaneous Tissue) Exposed exposed. There is no tunneling or undermining noted. There is a medium amount of serosanguineous drainage noted. The wound margin is flat and intact. There is large (67-100%) pink granulation within the wound bed. There is no necrotic tissue within the wound bed. The periwound skin appearance exhibited: Hemosiderin Staining. Periwound temperature was noted as No Abnormality. The periwound has tenderness on palpation. JEROMEY, CUNY ([id number]) Wound #5 status is Healed - Epithelialized. Original cause of wound was Trauma. The wound is located on the Left Hand - 5th Digit. The wound measures 0cm length x 0cm width x 0cm depth; 0cm^2 area and 0cm^3 volume. There is Fat  Layer (Subcutaneous Tissue) Exposed exposed. There is no tunneling or undermining noted. There is a medium amount of serosanguineous drainage noted. The wound margin is flat and intact. There is no granulation within the wound bed. There is a large (67-100%) amount of necrotic tissue within the wound bed including Eschar. The periwound skin appearance did not exhibit: Callus, Crepitus, Excoriation, Induration, Rash, Scarring, Dry/Scaly, Maceration, Atrophie Blanche, Cyanosis, Ecchymosis, Hemosiderin Staining, Mottled, Pallor, Rubor, Erythema. Periwound temperature was noted as No Abnormality. The periwound has tenderness on palpation. Wound #7 status is Healed - Epithelialized. Original cause of wound was Trauma. The wound is located on the Left Knee. The wound measures 0cm length x 0cm width x 0cm depth; 0cm^2 area and 0cm^3 volume. There is Fat Layer (Subcutaneous Tissue) Exposed exposed. There is no tunneling or undermining noted. There is a none present amount of drainage noted. The wound margin is flat and intact. There is no granulation within the wound bed. There is no necrotic tissue within the wound bed. The periwound skin appearance exhibited: Erythema. The periwound skin appearance did not exhibit: Callus, Crepitus, Excoriation, Induration, Rash, Scarring, Dry/Scaly, Maceration, Atrophie Blanche, Cyanosis, Ecchymosis, Hemosiderin Staining, Mottled, Pallor, Rubor. The surrounding wound skin color is noted with erythema which is circumferential. Periwound temperature was noted as No Abnormality. The periwound has tenderness on palpation. Assessment Active Problems ICD-10 Unspecified open wound of left elbow, initial encounter Unspecified open wound of left forearm, initial encounter Unspecified open wound of left wrist, initial encounter Unspecified open wound of left hand, initial encounter Unspecified open wound, left knee, initial encounter Unspecified open wound, right knee,  initial encounter Essential (primary) hypertension Type 2 diabetes mellitus with other skin ulcer Plan Discharge From [facility] Services: Discharge from Wound Care Center At this point I'm gonna discontinue wound care services since the patient appears to be doing so well he's in agreement with plan. We will see were things stand in the future if he has any other concerns or questions he will let us know. Otherwise I did advise him to be very careful and cautious with the skin obviously his skin is very fragile and does tear very easily. Electronic Signature(s) Signed: 10/21/2018 9:39:30 AM By: Lenda Kelp Hensley Entered By: Lenda Kelp on 10/21/2018 09:24:09 George Hensley, George Hensley ([id number], George Hensley ([id number]) -------------------------------------------------------------------------------- ROS/PFSH Details Patient Name:  George Blight F. Date of Service: 10/21/2018 8:15 AM Medical Record Number: [id number] Patient Account Number: [id number] Date of Birth/Sex: [birth year] (85 y.o. Male) Treating RN: George Sites Primary Care Provider: Bethann Punches Other Clinician: Referring Provider: Bethann Punches Treating Provider/Extender: George Dibbles, George Hensley: 6 Information Obtained From Patient Wound History Do you currently have one or more open woundso Yes How many open wounds do you currently haveo 6 Approximately how long have you had your woundso 1 week How have you been treating your wound(s) until nowo non adherant dressing, compression Has your wound(s) ever healed and then re-openedo No Have you had any lab work done in the past montho No Have you tested positive for an antibiotic resistant organism (MRSA, VRE)o No Have you tested positive for osteomyelitis (bone infection)o No Have you had any tests for circulation on your legso No Constitutional Symptoms (General Health) Complaints and Symptoms: Negative for: Fever; Chills Eyes Medical History: Past Medical  History Notes: macular degeneration Respiratory Complaints and Symptoms: No Complaints or Symptoms Medical History: Positive for: Asthma Cardiovascular Complaints and Symptoms: No Complaints or Symptoms Medical History: Positive for: Hypertension Past Medical History Notes: mitral valve disorder 2000 stent placed Endocrine Medical History: Positive for: Type II Diabetes - 20 years oral meds Time with diabetes: 20 years Treated with: Oral agents KINCAID, PEBLEY ([id number]) Blood sugar tested every day: No Integumentary (Skin) Medical History: Negative for: History of Burn; History of pressure wounds Neurologic Medical History: Past Medical History Notes: Stroke Psychiatric Complaints and Symptoms: No Complaints or Symptoms Immunizations Pneumococcal Vaccine: Received Pneumococcal Vaccination: Yes Implantable Devices No devices added Family and Social History Former smoker - cigar; Marital Status - Married; Alcohol Use: Never; Drug Use: No History; Caffeine Use: Daily; Financial Concerns: No; Food, Clothing or Shelter Needs: No; Support System Lacking: No; Transportation Concerns: No Physician Affirmation I have reviewed and agree with the above information. Electronic Signature(s) Signed: 10/21/2018 9:39:30 AM By: Lenda Kelp Hensley Signed: 10/21/2018 5:30:34 PM By: George Sites Entered By: Lenda Kelp on 10/21/2018 09:23:20 DARYLL, DOTO ([id number]) -------------------------------------------------------------------------------- SuperBill Details Patient Name: BASSAM, SACCO. Date of Service: 10/21/2018 Medical Record Number: [id number] Patient Account Number: [id number] Date of Birth/Sex: 11/20/1930 (85 y.o. Male) Treating RN: George Sites Primary Care Provider: Bethann Punches Other Clinician: Referring Provider: Bethann Punches Treating Provider/Extender: George Dibbles, George Hensley: 6 Diagnosis Coding ICD-10 Codes Code  Description S51.002A Unspecified open wound of left elbow, initial encounter S51.802A Unspecified open wound of left forearm, initial encounter S61.502A Unspecified open wound of left wrist, initial encounter S61.402A Unspecified open wound of left hand, initial encounter S81.002A Unspecified open wound, left knee, initial encounter S81.001A Unspecified open wound, right knee, initial encounter I10 Essential (primary) hypertension E11.622 Type 2 diabetes mellitus with other skin ulcer Facility Procedures CPT4 Code: 41937902 Description: 99213 - WOUND CARE VISIT-LEV 3 EST PT Modifier: Quantity: 1 Physician Procedures CPT4 Code: [id number] Description: 29924 - WC PHYS LEVEL 2 - EST PT ICD-10 Diagnosis Description S51.002A Unspecified open wound of left elbow, initial encounter S51.802A Unspecified open wound of left forearm, initial encounter S61.502A Unspecified open wound of left wrist,  initial encounter S61.402A Unspecified open wound of left hand, initial encounter Modifier: Quantity: 1 Electronic Signature(s) Signed: 10/21/2018 9:39:30 AM By: Lenda Kelp Hensley Entered By: Lenda Kelp on 10/21/2018 26:83:41

## 2018-10-23 NOTE — Progress Notes (Signed)
George Hensley, George Hensley ([id number]) Visit Report for 10/21/2018 Arrival Information Details Patient Name: George Hensley, George Hensley. Date of Service: 10/21/2018 8:15 AM Medical Record Number: [id number] Patient Account Number: [id number] Date of Birth/Sex: [birth year] (85 y.o. Male) Treating RN: Curtis Sites Primary Care Katrine Radich: Bethann Punches Other Clinician: Referring Kele Barthelemy: Bethann Punches Treating Allah Reason/Extender: Linwood Dibbles, HOYT Weeks in Treatment: 6 Visit Information History Since Last Visit Added or deleted any medications: No Patient Arrived: Ambulatory Any new allergies or adverse reactions: No Arrival Time: 08:15 Had a fall or experienced change in No Accompanied By: self activities of daily living that may affect Transfer Assistance: None risk of falls: Patient Identification Verified: Yes Signs or symptoms of abuse/neglect since last visito No Secondary Verification Process Completed: Yes Hospitalized since last visit: No Implantable device outside of the clinic excluding No cellular tissue based products placed in the center since last visit: Has Dressing in Place as Prescribed: Yes Pain Present Now: No Electronic Signature(s) Signed: 10/21/2018 3:26:36 PM By: George Hensley, RRT, CHT Entered By: George Martes on 10/21/2018 08:15:38 George Hensley ([id number]) -------------------------------------------------------------------------------- Clinic Level of Care Assessment Details Patient Name: George Hensley. Date of Service: 10/21/2018 8:15 AM Medical Record Number: [id number] Patient Account Number: [id number] Date of Birth/Sex: 10/14/1930 (85 y.o. Male) Treating RN: Curtis Sites Primary Care Sway Guttierrez: Bethann Punches Other Clinician: Referring Tyniah Kastens: Bethann Punches Treating Hermilo Dutter/Extender: Linwood Dibbles, HOYT Weeks in Treatment: 6 Clinic Level of Care Assessment Items TOOL 4 Quantity Score  - Use when only an EandM is performed on  FOLLOW-UP visit 0 ASSESSMENTS - Nursing Assessment / Reassessment X - Reassessment of Co-morbidities (includes updates in patient status) 1 10 X- 1 5 Reassessment of Adherence to Treatment Plan ASSESSMENTS - Wound and Skin Assessment / Reassessment  - Simple Wound Assessment / Reassessment - one wound 0 X- 3 5 Complex Wound Assessment / Reassessment - multiple wounds  - 0 Dermatologic / Skin Assessment (not related to wound area) ASSESSMENTS - Focused Assessment  - Circumferential Edema Measurements - multi extremities 0  - 0 Nutritional Assessment / Counseling / Intervention  - 0 Lower Extremity Assessment (monofilament, tuning fork, pulses)  - 0 Peripheral Arterial Disease Assessment (using hand held doppler) ASSESSMENTS - Ostomy and/or Continence Assessment and Care  - Incontinence Assessment and Management 0  - 0 Ostomy Care Assessment and Management (repouching, etc.) PROCESS - Coordination of Care X - Simple Patient / Family Education for ongoing care 1 15  - 0 Complex (extensive) Patient / Family Education for ongoing care X- 1 10 Staff obtains Chiropractor, Records, Test Results / Process Orders  - 0 Staff telephones HHA, Nursing Homes / Clarify orders / etc  - 0 Routine Transfer to another Facility (non-emergent condition)  - 0 Routine Hospital Admission (non-emergent condition)  - 0 New Admissions / Manufacturing engineer / Ordering NPWT, Apligraf, etc.  - 0 Emergency Hospital Admission (emergent condition) X- 1 10 Simple Discharge Coordination George Hensley, George Hensley ([id number])  - 0 Complex (extensive) Discharge Coordination PROCESS - Special Needs  - Pediatric / Minor Patient Management 0  - 0 Isolation Patient Management  - 0 Hearing / Language / Visual special needs  - 0 Assessment of Community assistance (transportation, D/C planning, etc.)  - 0 Additional assistance / Altered mentation  - 0 Support Surface(s)  Assessment (bed, cushion, seat, etc.) INTERVENTIONS - Wound Cleansing / Measurement  - Simple Wound Cleansing - one wound 0 X- 3 5 Complex Wound Cleansing - multiple wounds X- 1  5 Wound Imaging (photographs - any number of wounds) []  - 0 Wound Tracing (instead of photographs) []  - 0 Simple Wound Measurement - one wound X- 3 5 Complex Wound Measurement - multiple wounds INTERVENTIONS - Wound Dressings []  - Small Wound Dressing one or multiple wounds 0 []  - 0 Medium Wound Dressing one or multiple wounds []  - 0 Large Wound Dressing one or multiple wounds []  - 0 Application of Medications - topical []  - 0 Application of Medications - injection INTERVENTIONS - Miscellaneous []  - External ear exam 0 []  - 0 Specimen Collection (cultures, biopsies, blood, body fluids, etc.) []  - 0 Specimen(s) / Culture(s) sent or taken to Lab for analysis []  - 0 Patient Transfer (multiple staff / Nurse, adult / Similar devices) []  - 0 Simple Staple / Suture removal (25 or less) []  - 0 Complex Staple / Suture removal (26 or more) []  - 0 Hypo / Hyperglycemic Management (close monitor of Blood Glucose) []  - 0 Ankle / Brachial Index (ABI) - do not check if billed separately X- 1 5 Vital Signs George Hensley ([id number]) Has the patient been seen at the hospital within the last three years: Yes Total Score: 105 Level Of Care: New/Established - Level 3 Electronic Signature(s) Signed: 10/21/2018 5:30:34 PM By: Curtis Sites Entered By: Curtis Sites on 10/21/2018 08:45:43 George Hensley ([id number]) -------------------------------------------------------------------------------- Encounter Discharge Information Details Patient Name: George Hensley, George Hensley. Date of Service: 10/21/2018 8:15 AM Medical Record Number: [id number] Patient Account Number: [id number] Date of Birth/Sex: May 10, 1931 (85 y.o. Male) Treating RN: Curtis Sites Primary Care Shalie Schremp: Bethann Punches Other Clinician: Referring  Caridad Silveira: Bethann Punches Treating Deone Leifheit/Extender: Linwood Dibbles, HOYT Weeks in Treatment: 6 Encounter Discharge Information Items Discharge Condition: Stable Ambulatory Status: Ambulatory Discharge Destination: Home Transportation: Private Auto Accompanied By: self Schedule Follow-up Appointment: No Clinical Summary of Care: Electronic Signature(s) Signed: 10/21/2018 5:30:34 PM By: Curtis Sites Entered By: Curtis Sites on 10/21/2018 08:46:37 George Hensley ([id number]) -------------------------------------------------------------------------------- Lower Extremity Assessment Details Patient Name: George Hensley, George Hensley. Date of Service: 10/21/2018 8:15 AM Medical Record Number: [id number] Patient Account Number: [id number] Date of Birth/Sex: [birth year] (85 y.o. Male) Treating RN: Rodell Perna Primary Care Kinisha Soper: Bethann Punches Other Clinician: Referring Keller Bounds: Bethann Punches Treating Jupiter Boys/Extender: Linwood Dibbles, HOYT Weeks in Treatment: 6 Electronic Signature(s) Signed: 10/22/2018 9:17:40 AM By: Rodell Perna Entered By: Rodell Perna on 10/21/2018 08:24:16 George Hensley ([id number]) -------------------------------------------------------------------------------- Multi-Disciplinary Care Plan Details Patient Name: George Hensley, George Hensley. Date of Service: 10/21/2018 8:15 AM Medical Record Number: [id number] Patient Account Number: [id number] Date of Birth/Sex: May 27, 1931 (85 y.o. Male) Treating RN: Curtis Sites Primary Care Yurani Fettes: Bethann Punches Other Clinician: Referring Rawlins Stuard: Bethann Punches Treating Kenichi Cassada/Extender: Linwood Dibbles, HOYT Weeks in Treatment: 6 Active Inactive Electronic Signature(s) Signed: 10/21/2018 5:30:34 PM By: Curtis Sites Entered By: Curtis Sites on 10/21/2018 08:47:03 ETZIO, SINEX ([id number]) -------------------------------------------------------------------------------- Pain Assessment Details Patient Name: George Hensley, George Hensley. Date of Service:  10/21/2018 8:15 AM Medical Record Number: [id number] Patient Account Number: [id number] Date of Birth/Sex: Nov 22, 1930 (85 y.o. Male) Treating RN: Curtis Sites Primary Care Daelyn Mozer: Bethann Punches Other Clinician: Referring Donyell Carrell: Bethann Punches Treating Aidan Moten/Extender: Linwood Dibbles, HOYT Weeks in Treatment: 6 Active Problems Location of Pain Severity and Description of Pain Patient Has Paino No Site Locations Pain Management and Medication Current Pain Management: Electronic Signature(s) Signed: 10/21/2018 3:26:36 PM By: Sallee Provencal, RRT, CHT Signed: 10/21/2018 5:30:34 PM By: Curtis Sites Entered By: George Martes on 10/21/2018 08:15:48 George Hensley ([id number]) -------------------------------------------------------------------------------- Patient/Caregiver  Education Details Patient Name: George Hensley, George Hensley. Date of Service: 10/21/2018 8:15 AM Medical Record Number: [id number] Patient Account Number: [id number] Date of Birth/Gender: April 17, 1931 (85 y.o. Male) Treating RN: Curtis Sites Primary Care Physician: Bethann Punches Other Clinician: Referring Physician: Bethann Punches Treating Physician/Extender: Linwood Dibbles, HOYT Weeks in Treatment: 6 Education Assessment Education Provided To: Patient Education Topics Provided Basic Hygiene: Handouts: Other: care of newly healed ulcer site Methods: Explain/Verbal Responses: State content correctly Electronic Signature(s) Signed: 10/21/2018 5:30:34 PM By: Curtis Sites Entered By: Curtis Sites on 10/21/2018 08:46:52 George Hensley, George Hensley ([id number]) -------------------------------------------------------------------------------- Wound Assessment Details Patient Name: George Hensley, George Hensley. Date of Service: 10/21/2018 8:15 AM Medical Record Number: [id number] Patient Account Number: [id number] Date of Birth/Sex: Oct 09, 1930 (85 y.o. Male) Treating RN: Curtis Sites Primary Care Rossie Scarfone: Bethann Punches  Other Clinician: Referring Kjerstin Abrigo: Bethann Punches Treating Arvind Mexicano/Extender: Linwood Dibbles, HOYT Weeks in Treatment: 6 Wound Status Wound Number: 3R Primary Etiology: Trauma, Other Wound Location: Left, Distal Forearm Wound Status: Healed - Epithelialized Wounding Event: Trauma Comorbid History: Asthma, Hypertension, Type II Diabetes Date Acquired: 08/30/2018 Weeks Of Treatment: 6 Clustered Wound: No Photos Photo Uploaded By: Rodell Perna on 10/21/2018 08:37:58 Wound Measurements Length: (cm) 0 % Reduct Width: (cm) 0 % Reduct Depth: (cm) 0 Epitheli Area: (cm) 0 Tunneli Volume: (cm) 0 Undermi ion in Area: 100% ion in Volume: 100% alization: Small (1-33%) ng: No ning: No Wound Description Full Thickness Without Exposed Support Foul Od Classification: Structures Slough/ Wound Margin: Flat and Intact Exudate Medium Amount: Exudate Type: Serosanguineous Exudate Color: red, brown or After Cleansing: No Fibrino No Wound Bed Granulation Amount: Large (67-100%) Exposed Structure Granulation Quality: Pink Fascia Exposed: No Necrotic Amount: None Present (0%) Fat Layer (Subcutaneous Tissue) Exposed: Yes Tendon Exposed: No Muscle Exposed: No Joint Exposed: No Bone Exposed: No BABACAR, HAYCRAFT ([id number]) Periwound Skin Texture Texture Color No Abnormalities Noted: No No Abnormalities Noted: No Hemosiderin Staining: Yes Moisture No Abnormalities Noted: No Temperature / Pain Temperature: No Abnormality Tenderness on Palpation: Yes Wound Preparation Ulcer Cleansing: Rinsed/Irrigated with Saline Topical Anesthetic Applied: Other: lidocaine 4%, Electronic Signature(s) Signed: 10/21/2018 5:30:34 PM By: Curtis Sites Entered By: Curtis Sites on 10/21/2018 08:45:02 ALEXEY, RHOADS ([id number]) -------------------------------------------------------------------------------- Wound Assessment Details Patient Name: George Hensley, George Hensley. Date of Service: 10/21/2018 8:15  AM Medical Record Number: [id number] Patient Account Number: [id number] Date of Birth/Sex: [birth year] (85 y.o. Male) Treating RN: Curtis Sites Primary Care Yarden Hillis: Bethann Punches Other Clinician: Referring Dyer Klug: Bethann Punches Treating Alias Villagran/Extender: Linwood Dibbles, HOYT Weeks in Treatment: 6 Wound Status Wound Number: 5 Primary Etiology: Trauma, Other Wound Location: Left Hand - 5th Digit Wound Status: Healed - Epithelialized Wounding Event: Trauma Comorbid History: Asthma, Hypertension, Type II Diabetes Date Acquired: 08/30/2018 Weeks Of Treatment: 6 Clustered Wound: No Photos Photo Uploaded By: Rodell Perna on 10/21/2018 08:37:59 Wound Measurements Length: (cm) 0 % Reduct Width: (cm) 0 % Reduct Depth: (cm) 0 Epitheli Area: (cm) 0 Tunneli Volume: (cm) 0 Undermi ion in Area: 100% ion in Volume: 100% alization: Small (1-33%) ng: No ning: No Wound Description Classification: Partial Thickness Wound Margin: Flat and Intact Exudate Amount: Medium Exudate Type: Serosanguineous Exudate Color: red, brown Foul Odor After Cleansing: No Slough/Fibrino No Wound Bed Granulation Amount: None Present (0%) Exposed Structure Necrotic Amount: Large (67-100%) Fascia Exposed: No Necrotic Quality: Eschar Fat Layer (Subcutaneous Tissue) Exposed: Yes Tendon Exposed: No Muscle Exposed: No Joint Exposed: No Bone Exposed: No [address] GIVANNI, STARON George Hensley. ([id number]) Texture Color No Abnormalities Noted: No No Abnormalities  Noted: No Callus: No Atrophie Blanche: No Crepitus: No Cyanosis: No Excoriation: No Ecchymosis: No Induration: No Erythema: No Rash: No Hemosiderin Staining: No Scarring: No Mottled: No Pallor: No Moisture Rubor: No No Abnormalities Noted: No Dry / Scaly: No Temperature / Pain Maceration: No Temperature: No Abnormality Tenderness on Palpation: Yes Wound Preparation Ulcer Cleansing: Rinsed/Irrigated with Saline Topical Anesthetic  Applied: Other: lidocaine 4%, Electronic Signature(s) Signed: 10/21/2018 5:30:34 PM By: Curtis Sites Entered By: Curtis Sites on 10/21/2018 08:45:03 ALYAN, LUEBKE ([id number]) -------------------------------------------------------------------------------- Wound Assessment Details Patient Name: George Hensley, FAVINGER. Date of Service: 10/21/2018 8:15 AM Medical Record Number: [id number] Patient Account Number: [id number] Date of Birth/Sex: [birth year] (85 y.o. Male) Treating RN: Curtis Sites Primary Care Aloma Boch: Bethann Punches Other Clinician: Referring Veralyn Lopp: Bethann Punches Treating Kelwin Gibler/Extender: Linwood Dibbles, HOYT Weeks in Treatment: 6 Wound Status Wound Number: 7 Primary Etiology: Trauma, Other Wound Location: Left Knee Wound Status: Healed - Epithelialized Wounding Event: Trauma Comorbid History: Asthma, Hypertension, Type II Diabetes Date Acquired: 08/30/2018 Weeks Of Treatment: 6 Clustered Wound: Yes Photos Photo Uploaded By: Rodell Perna on 10/21/2018 08:38:15 Wound Measurements Length: (cm) 0 % Reduct Width: (cm) 0 % Reduct Depth: (cm) 0 Epitheli Clustered Quantity: 2 Tunnelin Area: (cm) 0 Undermi Volume: (cm) 0 ion in Area: 100% ion in Volume: 100% alization: Small (1-33%) g: No ning: No Wound Description Full Thickness Without Exposed Support Classification: Structures Wound Margin: Flat and Intact Exudate None Present Amount: Foul Odor After Cleansing: No Slough/Fibrino No Wound Bed Granulation Amount: None Present (0%) Exposed Structure Necrotic Amount: None Present (0%) Fascia Exposed: No Fat Layer (Subcutaneous Tissue) Exposed: Yes Tendon Exposed: No Muscle Exposed: No Joint Exposed: No Bone Exposed: No Periwound Skin Texture KAELIB, REIERSON ([id number]) Texture Color No Abnormalities Noted: No No Abnormalities Noted: No Callus: No Atrophie Blanche: No Crepitus: No Cyanosis: No Excoriation: No Ecchymosis: No Induration:  No Erythema: Yes Rash: No Erythema Location: Circumferential Scarring: No Hemosiderin Staining: No Mottled: No Moisture Pallor: No No Abnormalities Noted: No Rubor: No Dry / Scaly: No Maceration: No Temperature / Pain Temperature: No Abnormality Tenderness on Palpation: Yes Wound Preparation Ulcer Cleansing: Rinsed/Irrigated with Saline Topical Anesthetic Applied: Other: lidocaine 4%, Electronic Signature(s) Signed: 10/21/2018 5:30:34 PM By: Curtis Sites Entered By: Curtis Sites on 10/21/2018 08:45:03 KAZIMIR, DEVISSER ([id number]) -------------------------------------------------------------------------------- Vitals Details Patient Name: FIYINFOLUWA, IAQUINTA. Date of Service: 10/21/2018 8:15 AM Medical Record Number: [id number] Patient Account Number: [id number] Date of Birth/Sex: [birth year] (85 y.o. Male) Treating RN: Curtis Sites Primary Care Nalaysia Manganiello: Bethann Punches Other Clinician: Referring Sanda Dejoy: Bethann Punches Treating Tyshan Enderle/Extender: Linwood Dibbles, HOYT Weeks in Treatment: 6 Vital Signs Time Taken: 08:15 Temperature (George Hensley): 98.5 Height (in): 70 Pulse (bpm): 73 Weight (lbs): 160 Respiratory Rate (breaths/min): 16 Body Mass Index (BMI): 23 Blood Pressure (mmHg): 136/68 Reference Range: 80 - 120 mg / dl Electronic Signature(s) Signed: 10/21/2018 3:26:36 PM By: George Hensley, RRT, CHT Entered By: George Martes on 10/21/2018 08:18:45

## 2018-11-03 NOTE — Progress Notes (Signed)
George Hensley, George Hensley ([id number]) Visit Report for 09/25/2018 Arrival Information Details Patient Name: George Hensley, George Hensley. Date of Service: 09/25/2018 8:00 AM Medical Record Number: [id number] Patient Account Number: [id number] Date of Birth/Sex: Dec 22, 1930 (85 y.o. Male) Treating RN: Huel Coventry Primary Care Ival Basquez: Bethann Punches Other Clinician: Referring Margaux Engen: Bethann Punches Treating Evalette Montrose/Extender: Linwood Dibbles, HOYT Weeks in Treatment: 3 Visit Information History Since Last Visit Added or deleted any medications: No Patient Arrived: Ambulatory Any new allergies or adverse reactions: No Arrival Time: 08:04 Had a fall or experienced change in No Accompanied By: self activities of daily living that may affect Transfer Assistance: None risk of falls: Patient Identification Verified: Yes Signs or symptoms of abuse/neglect since last visito No Secondary Verification Process Completed: Yes Hospitalized since last visit: No Implantable device outside of the clinic excluding No cellular tissue based products placed in the center since last visit: Has Dressing in Place as Prescribed: Yes Pain Present Now: No Electronic Signature(s) Signed: 09/25/2018 5:40:20 PM By: Elliot Gurney, BSN, RN, CWS, Kim RN, BSN Entered By: Elliot Gurney, BSN, RN, CWS, Kim on 09/25/2018 08:04:54 George Hensley ([id number]) -------------------------------------------------------------------------------- Clinic Level of Care Assessment Details Patient Name: George Hensley, George Hensley. Date of Service: 09/25/2018 8:00 AM Medical Record Number: [id number] Patient Account Number: [id number] Date of Birth/Sex: [birth year] (85 y.o. Male) Treating RN: Arnette Norris Primary Care Broc Caspers: Bethann Punches Other Clinician: Referring Mechelle Pates: Bethann Punches Treating Maizie Garno/Extender: Linwood Dibbles, HOYT Weeks in Treatment: 3 Clinic Level of Care Assessment Items TOOL 4 Quantity Score []  - Use when only an EandM is performed on FOLLOW-UP visit  0 ASSESSMENTS - Nursing Assessment / Reassessment X - Reassessment of Co-morbidities (includes updates in patient status) 1 10 X- 1 5 Reassessment of Adherence to Treatment Plan ASSESSMENTS - Wound and Skin Assessment / Reassessment []  - Simple Wound Assessment / Reassessment - one wound 0 X- 8 5 Complex Wound Assessment / Reassessment - multiple wounds []  - 0 Dermatologic / Skin Assessment (not related to wound area) ASSESSMENTS - Focused Assessment []  - Circumferential Edema Measurements - multi extremities 0 []  - 0 Nutritional Assessment / Counseling / Intervention []  - 0 Lower Extremity Assessment (monofilament, tuning fork, pulses) []  - 0 Peripheral Arterial Disease Assessment (using hand held doppler) ASSESSMENTS - Ostomy and/or Continence Assessment and Care []  - Incontinence Assessment and Management 0 []  - 0 Ostomy Care Assessment and Management (repouching, etc.) PROCESS - Coordination of Care X - Simple Patient / Family Education for ongoing care 1 15 []  - 0 Complex (extensive) Patient / Family Education for ongoing care X- 1 10 Staff obtains Chiropractor, Records, Test Results / Process Orders []  - 0 Staff telephones HHA, Nursing Homes / Clarify orders / etc []  - 0 Routine Transfer to another Facility (non-emergent condition) []  - 0 Routine Hospital Admission (non-emergent condition) []  - 0 New Admissions / Manufacturing engineer / Ordering NPWT, Apligraf, etc. []  - 0 Emergency Hospital Admission (emergent condition) X- 1 10 Simple Discharge Coordination George Hensley, George Hensley ([id number]) []  - 0 Complex (extensive) Discharge Coordination PROCESS - Special Needs []  - Pediatric / Minor Patient Management 0 []  - 0 Isolation Patient Management []  - 0 Hearing / Language / Visual special needs []  - 0 Assessment of Community assistance (transportation, D/C planning, etc.) []  - 0 Additional assistance / Altered mentation []  - 0 Support Surface(s) Assessment (bed,  cushion, seat, etc.) INTERVENTIONS - Wound Cleansing / Measurement []  - Simple Wound Cleansing - one wound 0 X- 8 5 Complex Wound Cleansing - multiple  wounds X- 1 5 Wound Imaging (photographs - any number of wounds)  - 0 Wound Tracing (instead of photographs)  - 0 Simple Wound Measurement - one wound X- 8 5 Complex Wound Measurement - multiple wounds INTERVENTIONS - Wound Dressings  - Small Wound Dressing one or multiple wounds 0 X- 4 15 Medium Wound Dressing one or multiple wounds  - 0 Large Wound Dressing one or multiple wounds  - 0 Application of Medications - topical  - 0 Application of Medications - injection INTERVENTIONS - Miscellaneous  - External ear exam 0  - 0 Specimen Collection (cultures, biopsies, blood, body fluids, etc.)  - 0 Specimen(s) / Culture(s) sent or taken to Lab for analysis  - 0 Patient Transfer (multiple staff / Nurse, adult / Similar devices)  - 0 Simple Staple / Suture removal (25 or less)  - 0 Complex Staple / Suture removal (26 or more)  - 0 Hypo / Hyperglycemic Management (close monitor of Blood Glucose)  - 0 Ankle / Brachial Index (ABI) - do not check if billed separately X- 1 5 Vital Signs George Hensley, George Hensley ([id number]) Has the patient been seen at the hospital within the last three years: Yes Total Score: 240 Level Of Care: New/Established - Level 5 Electronic Signature(s) Signed: 11/03/2018 10:53:34 AM By: Arnette Norris Entered By: Arnette Norris on 09/25/2018 08:35:08 George Hensley ([id number]) -------------------------------------------------------------------------------- Lower Extremity Assessment Details Patient Name: George Hensley. Date of Service: 09/25/2018 8:00 AM Medical Record Number: [id number] Patient Account Number: [id number] Date of Birth/Sex: 1931-05-23 (85 y.o. Male) Treating RN: Huel Coventry Primary Care Gaynel Schaafsma: Bethann Punches Other Clinician: Referring Colleena Kurtenbach: Bethann Punches Treating Maylie Ashton/Extender: STONE III, HOYT Weeks in Treatment: 3 Edema Assessment Assessed: [Left: No] [Right: No] Edema: [Left: No] [Right: No] Vascular Assessment Pulses: Dorsalis Pedis Palpable: [Left:Yes] [Right:Yes] Posterior Tibial Extremity colors, hair growth, and conditions: Extremity Color: [Left:Normal] [Right:Normal] Hair Growth on Extremity: [Left:Yes] [Right:Yes] Temperature of Extremity: [Left:Warm] [Right:Warm] Capillary Refill: [Left:< 3 seconds] [Right:< 3 seconds] Electronic Signature(s) Signed: 09/25/2018 5:40:20 PM By: Elliot Gurney, BSN, RN, CWS, Kim RN, BSN Entered By: Elliot Gurney, BSN, RN, CWS, Kim on 09/25/2018 08:15:32 George Hensley ([id number]) -------------------------------------------------------------------------------- Multi Wound Chart Details Patient Name: George Hensley, George Hensley. Date of Service: 09/25/2018 8:00 AM Medical Record Number: [id number] Patient Account Number: [id number] Date of Birth/Sex: [birth year] (85 y.o. Male) Treating RN: Arnette Norris Primary Care Lekita Kerekes: Bethann Punches Other Clinician: Referring Avenly Roberge: Bethann Punches Treating Jennae Hakeem/Extender: Linwood Dibbles, HOYT Weeks in Treatment: 3 Photos: [1:No Photos] [2:No Photos] [3:No Photos] Wound Location: [1:Left Elbow] [2:Left, Proximal Forearm] [3:Left Forearm - Distal] Wounding Event: [1:Trauma] [2:Trauma] [3:Trauma] Primary Etiology: [1:Trauma, Other] [2:Trauma, Other] [3:Trauma, Other] Comorbid History: [1:N/A] [2:N/A] [3:Asthma, Hypertension, Type II Diabetes] Date Acquired: [1:08/30/2018] [2:08/30/2018] [3:08/30/2018] Weeks of Treatment: [1:3] [2:3] [3:3] Wound Status: [1:Open] [2:Healed - Epithelialized] [3:Open] Clustered Wound: [1:No] [2:No] [3:No] Measurements L x W x D [1:0.1x0.1x0.1] [2:0x0x0] [3:1x4x0.1] (cm) Area (cm) : [1:0.008] [2:0] [3:3.142] Volume (cm) : [1:0.001] [2:0] [3:0.314] % Reduction in Area: [1:99.90%] [2:100.00%] [3:61.90%] % Reduction in Volume: [1:99.90%]  [2:100.00%] [3:61.90%] Classification: [1:Full Thickness Without Exposed Support Structures] [2:Full Thickness Without Exposed Support Structures] [3:Full Thickness Without Exposed Support Structures] Exudate Amount: [1:N/A] [2:N/A] [3:Medium] Exudate Type: [1:N/A] [2:N/A] [3:Serosanguineous] Exudate Color: [1:N/A] [2:N/A] [3:red, brown] Wound Margin: [1:N/A] [2:N/A] [3:Flat and Intact] Granulation Amount: [1:N/A] [2:N/A] [3:Large (67-100%)] Granulation Quality: [1:N/A] [2:N/A] [3:Pink] Necrotic Amount: [1:N/A] [2:N/A] [3:None Present (0%)] Necrotic Tissue: [1:N/A] [2:N/A] [3:N/A] Epithelialization: [1:N/A] [2:N/A] [3:Small (1-33%)] Periwound Skin Texture: [1:No  Abnormalities Noted] [2:No Abnormalities Noted] [3:No Abnormalities Noted] Periwound Skin Moisture: [1:No Abnormalities Noted] [2:No Abnormalities Noted] [3:No Abnormalities Noted] Periwound Skin Color: [1:No Abnormalities Noted] [2:No Abnormalities Noted] [3:Hemosiderin Staining: Yes] Temperature: [1:N/A] [2:N/A] [3:No Abnormality] Tenderness on Palpation: [1:No] [2:No] [3:Yes] Wound Preparation: [1:N/A] [2:N/A] [3:Ulcer Cleansing: Rinsed/Irrigated with Saline] Wound Number: Photos: No Photos No Photos No Photos Wound Location: Left Wrist - Lateral Left Hand - 5th Digit Left, Lateral Knee Wounding Event: Trauma Trauma Trauma Primary Etiology: Trauma, Other Trauma, Other Trauma, Other Comorbid History: Asthma, Hypertension, Type II N/A N/A Diabetes Date Acquired: 08/30/2018 08/30/2018 08/30/2018 Weeks of Treatment: Wound Status: Open Open Open George Hensley, George Hensley ([id number]) Clustered Wound: No No No Measurements L x W x D 0.3x0.2x0.1 1x0.7x0.1 0.1x0.1x0.1 (cm) Area (cm) : 0.047 0.55 0.008 Volume (cm) : 0.005 0.055 0.001 % Reduction in Area: 96.90% -596.20% 99.50% % Reduction in Volume: 96.70% -587.50% 99.40% Classification: Full Thickness Without Partial Thickness Full Thickness Without Exposed Support  Structures Exposed Support Structures Exudate Amount: Medium N/A N/A Exudate Type: Serosanguineous N/A N/A Exudate Color: red, brown N/A N/A Wound Margin: Flat and Intact N/A N/A Granulation Amount: None Present (0%) N/A N/A Granulation Quality: N/A N/A N/A Necrotic Amount: Large (67-100%) N/A N/A Necrotic Tissue: Eschar, Adherent Slough N/A N/A Exposed Structures: Fat Layer (Subcutaneous N/A N/A Tissue) Exposed: Yes Fascia: No Tendon: No Muscle: No Joint: No Bone: No Epithelialization: None N/A N/A Periwound Skin Texture: Excoriation: No No Abnormalities Noted No Abnormalities Noted Induration: No Callus: No Crepitus: No Rash: No Scarring: No Periwound Skin Moisture: Maceration: No No Abnormalities Noted No Abnormalities Noted Dry/Scaly: No Periwound Skin Color: Hemosiderin Staining: Yes No Abnormalities Noted No Abnormalities Noted Atrophie Blanche: No Cyanosis: No Ecchymosis: No Erythema: No Mottled: No Pallor: No Rubor: No Temperature: No Abnormality N/A N/A Tenderness on Palpation: Yes No No Wound Preparation: Ulcer Cleansing: N/A N/A Rinsed/Irrigated with Saline Topical Anesthetic Applied: Other: lidocaine 4% Wound Number: Photos: No Photos No Photos No Photos Wound Location: Left Knee Right Knee Left Wrist - Medial Wounding Event: Trauma Trauma Trauma Primary Etiology: Trauma, Other Trauma, Other Skin Tear Comorbid History: N/A N/A Asthma, Hypertension, Type II Diabetes Date Acquired: 08/30/2018 08/30/2018 09/18/2018 Weeks of Treatment: DELL, BRINER ([id number]) Wound Status: Open Healed - Epithelialized Open Clustered Wound: Yes No No Measurements L x W x D 2x1x0.1 0x0x0 0.5x1x0.1 (cm) Area (cm) : 1.571 0 0.393 Volume (cm) : 0.157 0 0.039 % Reduction in Area: 77.80% 100.00% -318.10% % Reduction in Volume: 77.80% 100.00% -333.30% Classification: Full Thickness Without Full Thickness Without Full Thickness Without Exposed Support  Structures Exposed Support Structures Exposed Support Structures Exudate Amount: N/A N/A Medium Exudate Type: N/A N/A Serous Exudate Color: N/A N/A amber Wound Margin: N/A N/A Flat and Intact Granulation Amount: N/A N/A None Present (0%) Granulation Quality: N/A N/A N/A Necrotic Amount: N/A N/A Large (67-100%) Necrotic Tissue: N/A N/A Eschar Exposed Structures: N/A N/A Fat Layer (Subcutaneous Tissue) Exposed: Yes Fascia: No Tendon: No Muscle: No Joint: No Bone: No Epithelialization: N/A N/A None Periwound Skin Texture: No Abnormalities Noted No Abnormalities Noted Excoriation: No Induration: No Callus: No Crepitus: No Rash: No Scarring: No Periwound Skin Moisture: No Abnormalities Noted No Abnormalities Noted Maceration: No Dry/Scaly: No Periwound Skin Color: No Abnormalities Noted No Abnormalities Noted Atrophie Blanche: No Cyanosis: No Ecchymosis: No Erythema: No Hemosiderin Staining: No Mottled: No Pallor: No Rubor: No Temperature: N/A N/A N/A Tenderness on Palpation:  No No No Wound Preparation: N/A N/A Ulcer Cleansing: Rinsed/Irrigated with Saline Topical Anesthetic Applied: Other: lidocaine 4% Treatment Notes Electronic Signature(s) Signed: 11/03/2018 10:53:34 AM By: Arnette Norris Entered By: Arnette Norris on 09/25/2018 08:14:25 George Hensley, George Hensley ([id number]) George Hensley, George Hensley ([id number]) -------------------------------------------------------------------------------- Multi-Disciplinary Care Plan Details Patient Name: George Hensley, George Hensley. Date of Service: 09/25/2018 8:00 AM Medical Record Number: [id number] Patient Account Number: [id number] Date of Birth/Sex: 1931/03/28 (85 y.o. Male) Treating RN: Arnette Norris Primary Care Nelle Sayed: Bethann Punches Other Clinician: Referring Kerissa Coia: Bethann Punches Treating Barbaraann Avans/Extender: Linwood Dibbles, HOYT Weeks in Treatment: 3 Active Inactive Abuse / Safety / Falls / Self Care Management Nursing Diagnoses: History of  Falls Goals: Patient will not experience any injury related to falls Date Initiated: 09/04/2018 Target Resolution Date: 11/29/2018 Goal Status: Active Interventions: Assess fall risk on admission and as needed Notes: Orientation to the Wound Care Program Nursing Diagnoses: Knowledge deficit related to the wound healing center program Goals: Patient/caregiver will verbalize understanding of the Wound Healing Center Program Date Initiated: 09/04/2018 Target Resolution Date: 11/29/2018 Goal Status: Active Interventions: Provide education on orientation to the wound center Notes: Wound/Skin Impairment Nursing Diagnoses: Impaired tissue integrity Goals: Ulcer/skin breakdown will heal within 14 weeks Date Initiated: 09/04/2018 Target Resolution Date: 11/29/2018 Goal Status: Active Interventions: Assess patient/caregiver ability to obtain necessary supplies ZORA, COHO ([id number]) Assess patient/caregiver ability to perform ulcer/skin care regimen upon admission and as needed Assess ulceration(s) every visit Notes: Electronic Signature(s) Signed: 11/03/2018 10:53:34 AM By: Arnette Norris Entered By: Arnette Norris on 09/25/2018 08:14:12 George Hensley ([id number]) -------------------------------------------------------------------------------- Pain Assessment Details Patient Name: George Hensley. Date of Service: 09/25/2018 8:00 AM Medical Record Number: [id number] Patient Account Number: [id number] Date of Birth/Sex: [birth year] (85 y.o. Male) Treating RN: Huel Coventry Primary Care Porchia Sinkler: Bethann Punches Other Clinician: Referring Contrell Ballentine: Bethann Punches Treating Ronie Fleeger/Extender: Linwood Dibbles, HOYT Weeks in Treatment: 3 Active Problems Location of Pain Severity and Description of Pain Patient Has Paino No Site Locations Pain Management and Medication Current Pain Management: Electronic Signature(s) Signed: 09/25/2018 5:40:20 PM By: Elliot Gurney, BSN, RN, CWS, Kim RN, BSN Entered By:  Elliot Gurney, BSN, RN, CWS, Kim on 09/25/2018 08:05:00 George Hensley ([id number]) -------------------------------------------------------------------------------- Patient/Caregiver Education Details Patient Name: VANDER, YAP. Date of Service: 09/25/2018 8:00 AM Medical Record Number: [id number] Patient Account Number: [id number] Date of Birth/Gender: 09-Aug-1931 (85 y.o. Male) Treating RN: Arnette Norris Primary Care Physician: Bethann Punches Other Clinician: Referring Physician: Bethann Punches Treating Physician/Extender: Linwood Dibbles, HOYT Weeks in Treatment: 3 Education Assessment Education Provided To: Patient Education Topics Provided Wound/Skin Impairment: Handouts: Caring for Your Ulcer Methods: Demonstration, Explain/Verbal Responses: State content correctly Electronic Signature(s) Signed: 11/03/2018 10:53:34 AM By: Arnette Norris Entered By: Arnette Norris on 09/25/2018 08:23:07 George Hensley ([id number]) -------------------------------------------------------------------------------- Wound Assessment Details Patient Name: George Hensley, George Hensley. Date of Service: 09/25/2018 8:00 AM Medical Record Number: [id number] Patient Account Number: [id number] Date of Birth/Sex: 1931-04-21 (85 y.o. Male) Treating RN: Arnette Norris Primary Care Krystian Younglove: Bethann Punches Other Clinician: Referring Tayten Heber: Bethann Punches Treating Michael Ventresca/Extender: Linwood Dibbles, HOYT Weeks in Treatment: 3 Wound Status Wound Number: 1 Primary Etiology: Trauma, Other Wound Location: Left Elbow Wound Status: Healed - Epithelialized Wounding Event: Trauma Date Acquired: 08/30/2018 Weeks Of Treatment: 3 Clustered Wound: No Photos Photo Uploaded By: Elliot Gurney, BSN, RN, CWS, Kim on 09/25/2018 17:30:39 Wound Measurements Length: (cm) 0 Width: (cm) 0 Depth: (cm) 0 Area: (cm) 0 Volume: (cm) 0 % Reduction in Area: 100% % Reduction in Volume: 100% Wound Description Full Thickness  Without Exposed  Support Classification: Structures Periwound Skin Texture Texture Color No Abnormalities Noted: No No Abnormalities Noted: No Moisture No Abnormalities Noted: No Electronic Signature(s) Signed: 11/03/2018 10:53:34 AM By: Arnette Norris Entered By: Arnette Norris on 09/25/2018 08:24:29 George Hensley ([id number]) -------------------------------------------------------------------------------- Wound Assessment Details Patient Name: George Hensley. Date of Service: 09/25/2018 8:00 AM Medical Record Number: [id number] Patient Account Number: [id number] Date of Birth/Sex: 1930/11/08 (85 y.o. Male) Treating RN: Huel Coventry Primary Care Cherina Dhillon: Bethann Punches Other Clinician: Referring Sunset Joshi: Bethann Punches Treating Kimia Finan/Extender: Linwood Dibbles, HOYT Weeks in Treatment: 3 Wound Status Wound Number: 2 Primary Etiology: Trauma, Other Wound Location: Left, Proximal Forearm Wound Status: Healed - Epithelialized Wounding Event: Trauma Date Acquired: 08/30/2018 Weeks Of Treatment: 3 Clustered Wound: No Photos Photo Uploaded By: Elliot Gurney, BSN, RN, CWS, Kim on 09/25/2018 17:30:40 Wound Measurements Length: (cm) 0 Width: (cm) 0 Depth: (cm) 0 Area: (cm) 0 Volume: (cm) 0 % Reduction in Area: 100% % Reduction in Volume: 100% Wound Description Full Thickness Without Exposed Support Classification: Structures Periwound Skin Texture Texture Color No Abnormalities Noted: No No Abnormalities Noted: No Moisture No Abnormalities Noted: No Electronic Signature(s) Signed: 09/25/2018 5:40:20 PM By: Elliot Gurney, BSN, RN, CWS, Kim RN, BSN Entered By: Elliot Gurney, BSN, RN, CWS, Kim on 09/25/2018 08:13:01 George Hensley ([id number]) -------------------------------------------------------------------------------- Wound Assessment Details Patient Name: George Hensley, George Hensley. Date of Service: 09/25/2018 8:00 AM Medical Record Number: [id number] Patient Account Number: [id number] Date of Birth/Sex:  14-Feb-1931 (85 y.o. Male) Treating RN: Arnette Norris Primary Care Prentis Langdon: Bethann Punches Other Clinician: Referring Naksh Radi: Bethann Punches Treating Sherlin Sonier/Extender: Linwood Dibbles, HOYT Weeks in Treatment: 3 Wound Status Wound Number: 3 Primary Etiology: Trauma, Other Wound Location: Left, Distal Forearm Wound Status: Healed - Epithelialized Wounding Event: Trauma Comorbid History: Asthma, Hypertension, Type II Diabetes Date Acquired: 08/30/2018 Weeks Of Treatment: 3 Clustered Wound: No Photos Photo Uploaded By: Elliot Gurney, BSN, RN, CWS, Kim on 09/25/2018 17:31:11 Wound Measurements Length: (cm) 0 % Reduct Width: (cm) 0 % Reduct Depth: (cm) 0 Epitheli Area: (cm) 0 Tunneli Volume: (cm) 0 Undermi ion in Area: 100% ion in Volume: 100% alization: Small (1-33%) ng: No ning: No Wound Description Full Thickness Without Exposed Support Foul Odo Classification: Structures Slough/F Wound Margin: Flat and Intact Exudate Medium Amount: Exudate Type: Serosanguineous Exudate Color: red, brown r After Cleansing: No ibrino Yes Wound Bed Granulation Amount: Large (67-100%) Exposed Structure Granulation Quality: Pink Fascia Exposed: No Necrotic Amount: None Present (0%) Fat Layer (Subcutaneous Tissue) Exposed: Yes Tendon Exposed: No Muscle Exposed: No Joint Exposed: No Bone Exposed: No George Hensley, AYER ([id number]) Periwound Skin Texture Texture Color No Abnormalities Noted: No No Abnormalities Noted: No Hemosiderin Staining: Yes Moisture No Abnormalities Noted: No Temperature / Pain Temperature: No Abnormality Tenderness on Palpation: Yes Wound Preparation Ulcer Cleansing: Rinsed/Irrigated with Saline Electronic Signature(s) Signed: 11/03/2018 10:53:34 AM By: Arnette Norris Entered By: Arnette Norris on 09/25/2018 08:28:45 George Hensley ([id number]) -------------------------------------------------------------------------------- Wound Assessment Details Patient  Name: George Hensley. Date of Service: 09/25/2018 8:00 AM Medical Record Number: [id number] Patient Account Number: [id number] Date of Birth/Sex: [birth year] (85 y.o. Male) Treating RN: Huel Coventry Primary Care Tamaira Ciriello: Bethann Punches Other Clinician: Referring Pricsilla Lindvall: Bethann Punches Treating Tabria Steines/Extender: STONE III, HOYT Weeks in Treatment: 3 Wound Status Wound Number: 4 Primary Etiology: Trauma, Other Wound Location: Left Wrist - Lateral Wound Status: Open Wounding Event: Trauma Comorbid History: Asthma, Hypertension, Type II Diabetes Date Acquired: 08/30/2018 Weeks Of Treatment: 3 Clustered Wound: No Photos Photo Uploaded By: Elliot Gurney, BSN,  RN, CWS, Kim on 09/25/2018 17:31:12 Wound Measurements Length: (cm) 0.3 Width: (cm) 0.2 Depth: (cm) 0.1 Area: (cm) 0.047 Volume: (cm) 0.005 % Reduction in Area: 96.9% % Reduction in Volume: 96.7% Epithelialization: None Tunneling: No Undermining: No Wound Description Full Thickness Without Exposed Support Foul Odo Classification: Structures Slough/F Wound Margin: Flat and Intact Exudate Medium Amount: Exudate Type: Serosanguineous Exudate Color: red, brown r After Cleansing: No ibrino Yes Wound Bed Granulation Amount: None Present (0%) Exposed Structure Necrotic Amount: Large (67-100%) Fascia Exposed: No Necrotic Quality: Eschar, Adherent Slough Fat Layer (Subcutaneous Tissue) Exposed: Yes Tendon Exposed: No Muscle Exposed: No Joint Exposed: No Bone Exposed: No FARON, TUDISCO ([id number]) Periwound Skin Texture Texture Color No Abnormalities Noted: No No Abnormalities Noted: No Callus: No Atrophie Blanche: No Crepitus: No Cyanosis: No Excoriation: No Ecchymosis: No Induration: No Erythema: No Rash: No Hemosiderin Staining: Yes Scarring: No Mottled: No Pallor: No Moisture Rubor: No No Abnormalities Noted: No Dry / Scaly: No Temperature / Pain Maceration: No Temperature: No Abnormality Tenderness on  Palpation: Yes Wound Preparation Ulcer Cleansing: Rinsed/Irrigated with Saline Topical Anesthetic Applied: Other: lidocaine 4%, Electronic Signature(s) Signed: 09/25/2018 5:40:20 PM By: Elliot Gurney, BSN, RN, CWS, Kim RN, BSN Entered By: Elliot Gurney, BSN, RN, CWS, Kim on 09/25/2018 08:14:17 HANZEL, PIZZO ([id number]) -------------------------------------------------------------------------------- Wound Assessment Details Patient Name: BREVAN, LUBERTO. Date of Service: 09/25/2018 8:00 AM Medical Record Number: [id number] Patient Account Number: [id number] Date of Birth/Sex: August 12, 1931 (85 y.o. Male) Treating RN: Huel Coventry Primary Care Odilia Damico: Bethann Punches Other Clinician: Referring Ashten Prats: Bethann Punches Treating Jyra Lagares/Extender: Linwood Dibbles, HOYT Weeks in Treatment: 3 Wound Status Wound Number: 5 Primary Etiology: Trauma, Other Wound Location: Left Hand - 5th Digit Wound Status: Open Wounding Event: Trauma Comorbid History: Asthma, Hypertension, Type II Diabetes Date Acquired: 08/30/2018 Weeks Of Treatment: 3 Clustered Wound: No Photos Photo Uploaded By: Elliot Gurney, BSN, RN, CWS, Kim on 09/25/2018 17:31:50 Wound Measurements Length: (cm) 1 Width: (cm) 0.7 Depth: (cm) 0.1 Area: (cm) 0.55 Volume: (cm) 0.055 % Reduction in Area: -596.2% % Reduction in Volume: -587.5% Epithelialization: None Tunneling: No Undermining: No Wound Description Classification: Partial Thickness Wound Margin: Flat and Intact Exudate Amount: Medium Exudate Type: Serosanguineous Exudate Color: red, brown Foul Odor After Cleansing: No Slough/Fibrino Yes Wound Bed Granulation Amount: None Present (0%) Exposed Structure Necrotic Amount: Large (67-100%) Fascia Exposed: No Necrotic Quality: Eschar, Adherent Slough Fat Layer (Subcutaneous Tissue) Exposed: Yes Tendon Exposed: No Muscle Exposed: No Joint Exposed: No Bone Exposed: No Periwound Skin Texture JAMARI, MOTEN ([id number]) Texture Color No  Abnormalities Noted: No No Abnormalities Noted: No Callus: No Atrophie Blanche: No Crepitus: No Cyanosis: No Excoriation: No Ecchymosis: No Induration: No Erythema: No Rash: No Hemosiderin Staining: Yes Scarring: No Mottled: No Pallor: No Moisture Rubor: No No Abnormalities Noted: No Dry / Scaly: No Temperature / Pain Maceration: No Temperature: No Abnormality Tenderness on Palpation: Yes Wound Preparation Ulcer Cleansing: Rinsed/Irrigated with Saline Topical Anesthetic Applied: Other: lidocaine 4%, Electronic Signature(s) Signed: 09/25/2018 5:40:20 PM By: Elliot Gurney, BSN, RN, CWS, Kim RN, BSN Entered By: Elliot Gurney, BSN, RN, CWS, Kim on 09/25/2018 08:14:35 George Hensley ([id number]) -------------------------------------------------------------------------------- Wound Assessment Details Patient Name: YUVAL, RUBENS. Date of Service: 09/25/2018 8:00 AM Medical Record Number: [id number] Patient Account Number: [id number] Date of Birth/Sex: 12/09/1930 (85 y.o. Male) Treating RN: Arnette Norris Primary Care Camreigh Michie: Bethann Punches Other Clinician: Referring Ivan Lacher: Bethann Punches Treating Giannie Soliday/Extender: STONE III, HOYT Weeks in Treatment: 3 Wound Status Wound Number: 6 Primary Etiology: Trauma, Other Wound Location:  Left, Lateral Knee Wound Status: Healed - Epithelialized Wounding Event: Trauma Date Acquired: 08/30/2018 Weeks Of Treatment: 3 Clustered Wound: No Photos Photo Uploaded By: Elliot Gurney, BSN, RN, CWS, Kim on 09/25/2018 17:31:51 Wound Measurements Length: (cm) 0 Width: (cm) 0 Depth: (cm) 0 Area: (cm) 0 Volume: (cm) 0 % Reduction in Area: 100% % Reduction in Volume: 100% Wound Description Full Thickness Without Exposed Support Classification: Structures Periwound Skin Texture Texture Color No Abnormalities Noted: No No Abnormalities Noted: No Moisture No Abnormalities Noted: No Electronic Signature(s) Signed: 11/03/2018 10:53:34 AM By: Arnette Norris Entered By: Arnette Norris on 09/25/2018 08:25:57 George Hensley ([id number]) -------------------------------------------------------------------------------- Wound Assessment Details Patient Name: George Hensley. Date of Service: 09/25/2018 8:00 AM Medical Record Number: [id number] Patient Account Number: [id number] Date of Birth/Sex: [birth year] (85 y.o. Male) Treating RN: Huel Coventry Primary Care Keena Dinse: Bethann Punches Other Clinician: Referring Charity Tessier: Bethann Punches Treating Delance Weide/Extender: Linwood Dibbles, HOYT Weeks in Treatment: 3 Wound Status Wound Number: 7 Primary Etiology: Trauma, Other Wound Location: Left Knee Wound Status: Open Wounding Event: Trauma Comorbid History: Asthma, Hypertension, Type II Diabetes Date Acquired: 08/30/2018 Weeks Of Treatment: 3 Clustered Wound: Yes Photos Photo Uploaded By: Elliot Gurney, BSN, RN, CWS, Kim on 09/25/2018 17:32:29 Wound Measurements Length: (cm) 2 Width: (cm) 1 Depth: (cm) 0.1 Clustered Quantity: 2 Area: (cm) 1.571 Volume: (cm) 0.157 % Reduction in Area: 77.8% % Reduction in Volume: 77.8% Epithelialization: Medium (34-66%) Tunneling: No Undermining: No Wound Description Full Thickness Without Exposed Support Foul Odor Classification: Structures Slough/Fi Wound Margin: Flat and Intact Exudate None Present Amount: After Cleansing: No brino No Wound Bed Granulation Amount: None Present (0%) Exposed Structure Necrotic Amount: Large (67-100%) Fascia Exposed: No Necrotic Quality: Eschar, Adherent Slough Fat Layer (Subcutaneous Tissue) Exposed: Yes Tendon Exposed: No Muscle Exposed: No Joint Exposed: No Bone Exposed: No Periwound Skin Texture SHAFER, SWAMY ([id number]) Texture Color No Abnormalities Noted: No No Abnormalities Noted: No Callus: No Atrophie Blanche: No Crepitus: No Cyanosis: No Excoriation: No Ecchymosis: No Induration: No Erythema: Yes Rash: No Erythema Location:  Circumferential Scarring: No Hemosiderin Staining: No Mottled: No Moisture Pallor: No No Abnormalities Noted: No Rubor: No Dry / Scaly: No Maceration: No Temperature / Pain Temperature: No Abnormality Tenderness on Palpation: Yes Wound Preparation Ulcer Cleansing: Rinsed/Irrigated with Saline Topical Anesthetic Applied: Other: lidocaine 4%, Electronic Signature(s) Signed: 09/25/2018 5:40:20 PM By: Elliot Gurney, BSN, RN, CWS, Kim RN, BSN Entered By: Elliot Gurney, BSN, RN, CWS, Kim on 09/25/2018 08:14:50 KADEEM, HYLE ([id number]) -------------------------------------------------------------------------------- Wound Assessment Details Patient Name: KOFI, MURRELL. Date of Service: 09/25/2018 8:00 AM Medical Record Number: [id number] Patient Account Number: [id number] Date of Birth/Sex: 08/11/1931 (85 y.o. Male) Treating RN: Huel Coventry Primary Care Blair Mesina: Bethann Punches Other Clinician: Referring Itay Mella: Bethann Punches Treating Dontreal Miera/Extender: Linwood Dibbles, HOYT Weeks in Treatment: 3 Wound Status Wound Number: 8 Primary Etiology: Trauma, Other Wound Location: Right Knee Wound Status: Healed - Epithelialized Wounding Event: Trauma Date Acquired: 08/30/2018 Weeks Of Treatment: 3 Clustered Wound: No Photos Photo Uploaded By: Elliot Gurney, BSN, RN, CWS, Kim on 09/25/2018 17:32:30 Wound Measurements Length: (cm) 0 Width: (cm) 0 Depth: (cm) 0 Area: (cm) 0 Volume: (cm) 0 % Reduction in Area: 100% % Reduction in Volume: 100% Wound Description Full Thickness Without Exposed Support Classification: Structures Periwound Skin Texture Texture Color No Abnormalities Noted: No No Abnormalities Noted: No Moisture No Abnormalities Noted: No Electronic Signature(s) Signed: 09/25/2018 5:40:20 PM By: Elliot Gurney, BSN, RN, CWS, Kim RN, BSN Entered By: Elliot Gurney, BSN, RN, CWS, Kim on 09/25/2018 08:13:01  George ParisJOHNSON, Iyan F.  ([id number]) -------------------------------------------------------------------------------- Wound Assessment Details Patient Name: George ParisJOHNSON, Jaquavis F. Date of Service: 09/25/2018 8:00 AM Medical Record Number: [id number] Patient Account Number: [id number] Date of Birth/Sex: 02-03-1931 [age] y.o. Male) Treating RN: Huel CoventryWoody, Kim Primary Care Zella Dewan: Bethann PunchesMiller, Mark Other Clinician: Referring Jayquon Theiler: Bethann PunchesMiller, Mark Treating Benjiman Sedgwick/Extender: Linwood DibblesSTONE III, HOYT Weeks in Treatment: 3 Wound Status Wound Number: 9 Primary Etiology: Skin Tear Wound Location: Left Wrist - Medial Wound Status: Open Wounding Event: Trauma Comorbid History: Asthma, Hypertension, Type II Diabetes Date Acquired: 09/18/2018 Weeks Of Treatment: 1 Clustered Wound: No Photos Photo Uploaded By: Elliot GurneyWoody, BSN, RN, CWS, Kim on 09/25/2018 17:33:24 Wound Measurements Length: (cm) 0.5 Width: (cm) 1 Depth: (cm) 0.1 Area: (cm) 0.393 Volume: (cm) 0.039 % Reduction in Area: -318.1% % Reduction in Volume: -333.3% Epithelialization: None Tunneling: No Wound Description Full Thickness Without Exposed Support Foul Odo Classification: Structures Slough/F Wound Margin: Flat and Intact Exudate Medium Amount: Exudate Type: Serous Exudate Color: amber r After Cleansing: No ibrino No Wound Bed Granulation Amount: None Present (0%) Exposed Structure Necrotic Amount: Large (67-100%) Fascia Exposed: No Necrotic Quality: Eschar Fat Layer (Subcutaneous Tissue) Exposed: Yes Tendon Exposed: No Muscle Exposed: No Joint Exposed: No Bone Exposed: No George ParisJOHNSON, Simone F. ([id number]) Periwound Skin Texture Texture Color No Abnormalities Noted: No No Abnormalities Noted: No Callus: No Atrophie Blanche: No Crepitus: No Cyanosis: No Excoriation: No Ecchymosis: No Induration: No Erythema: No Rash: No Hemosiderin Staining: No Scarring: No Mottled: No Pallor: No Moisture Rubor: No No Abnormalities Noted: No Dry / Scaly:  No Maceration: No Wound Preparation Ulcer Cleansing: Rinsed/Irrigated with Saline Topical Anesthetic Applied: Other: lidocaine 4%, Electronic Signature(s) Signed: 09/25/2018 5:40:20 PM By: Elliot GurneyWoody, BSN, RN, CWS, Kim RN, BSN Entered By: Elliot GurneyWoody, BSN, RN, CWS, Kim on 09/25/2018 08:13:21 George ParisJOHNSON, Aydeen F. ([id number]) -------------------------------------------------------------------------------- Vitals Details Patient Name: George ParisJOHNSON, Kingsly F. Date of Service: 09/25/2018 8:00 AM Medical Record Number: [id number] Patient Account Number: [id number] Date of Birth/Sex: 02-03-1931 [age] y.o. Male) Treating RN: Arnette NorrisBiell, Kristina Primary Care Tarena Gockley: Bethann PunchesMiller, Mark Other Clinician: Referring Sameer Teeple: Bethann PunchesMiller, Mark Treating Cressie Betzler/Extender: Linwood DibblesSTONE III, HOYT Weeks in Treatment: 3 Vital Signs Time Taken: 08:05 Temperature (F): 97.8 Height (in): 70 Pulse (bpm): 82 Weight (lbs): 160 Respiratory Rate (breaths/min): 18 Body Mass Index (BMI): 23 Blood Pressure (mmHg): 145/74 Reference Range: 80 - 120 mg / dl Electronic Signature(s) Signed: 09/25/2018 11:23:59 AM By: Dayton MartesWallace, RCP,RRT,CHT, Sallie RCP, RRT, CHT Entered By: Dayton MartesWallace, RCP,RRT,CHT, Sallie on 09/25/2018 08:17:58

## 2018-11-03 NOTE — Progress Notes (Signed)
WALDEN, CROFOOT ([id number]) Visit Report for 09/25/2018 Chief Complaint Document Details Patient Name: George Hensley, George Hensley. Date of Service: 09/25/2018 8:00 AM Medical Record Number: [id number] Patient Account Number: [id number] Date of Birth/Sex: [birth year] (85 y.o. Male) Treating RN: Arnette Norris Primary Care Provider: Bethann Punches Other Clinician: Referring Provider: Bethann Punches Treating Provider/Extender: Linwood Dibbles, HOYT Weeks in Treatment: 3 Information Obtained from: Patient Chief Complaint Multiple Left UE and Bilateral LE Ulcers Electronic Signature(s) Signed: 09/25/2018 9:52:35 PM By: Lenda Kelp PA-C Entered By: Lenda Kelp on 09/25/2018 08:19:19 JAXSTIN, ZENISEK ([id number]) -------------------------------------------------------------------------------- HPI Details Patient Name: Hensley, George. Date of Service: 09/25/2018 8:00 AM Medical Record Number: [id number] Patient Account Number: [id number] Date of Birth/Sex: 11/10/1930 (85 y.o. Male) Treating RN: Arnette Norris Primary Care Provider: Bethann Punches Other Clinician: Referring Provider: Bethann Punches Treating Provider/Extender: Linwood Dibbles, HOYT Weeks in Treatment: 3 History of Present Illness HPI Description: 09/04/18 on evaluation today patient actually appears to be doing somewhat poorly upon initial evaluation in regard to his left upper extremity in his bilateral knees. He unfortunately sustained a fall while he was at the beach this past Saturday 08/30/18. Nonetheless he was seen in the emergency department initially and then subsequently was seen by his primary care provider Dr. Hyacinth Meeker who referred him to [place] for further evaluation and treatment. Subsequently he was given a tetanus shot during the course of all this and also was placed on Flex 250 mg three times a day for 10 days by Dr. Hyacinth Meeker. Currently the patient has several wounds in varying stages of stability over the left upper extremity and the  bilateral knee regions. He does have a history of diabetes mellitus type II and hypertension. 09/11/18 on evaluation today patient appears to be doing much better in regard to his multiple upper and lower Trinity ulcer's. All are measuring smaller today which is excellent news. Overall he has been tolerating the dressing changes although unfortunately he has not received his dressings. They been using the Bactroban ointment along with nonadherent pads they purchased at the pharmacy. 09/18/18 on evaluation today patient appears to be doing rather well at this point from the standpoint of his ulcers. Fortunately there does not appear to be any evidence of infection at this time which is good news. He seems to be showing signs of improvement both in regard to the upper and lower extremities currently. No fevers, chills, nausea, or vomiting noted at this time. 09/25/18 on evaluation today patient appears to be doing very well in regard to his multiple ulcers of the upper and lower extremities. He has been tolerating the dressing changes without complication. Fortunately there is no evidence of infection at this time. Overall very pleased with how things appear to be progressing. Several of the wounds have healed as of today. Electronic Signature(s) Signed: 09/25/2018 9:52:35 PM By: Lenda Kelp PA-C Entered By: Lenda Kelp on 09/25/2018 09:14:41 [name] ([id number]) -------------------------------------------------------------------------------- Physical Exam Details Patient Name: Hensley, George. Date of Service: 09/25/2018 8:00 AM Medical Record Number: [id number] Patient Account Number: [id number] Date of Birth/Sex: [birth year] (85 y.o. Male) Treating RN: Arnette Norris Primary Care Provider: Bethann Punches Other Clinician: Referring Provider: Bethann Punches Treating Provider/Extender: STONE III, HOYT Weeks in Treatment: 3 Constitutional Well-nourished and well-hydrated in no acute  distress. Respiratory normal breathing without difficulty. clear to auscultation bilaterally. Cardiovascular regular rate and rhythm with normal S1, S2. Psychiatric this patient is able to make decisions and demonstrates good insight into disease  process. Alert and Oriented x 3. pleasant and cooperative. Notes Patient's wound bed currently shows signs of good epithelialization pretty much all locations and overall again he has several regions which have completely healed and ones that have not are definitely on the way. Wound overall very pleased with the progress and how things seem to be trending at this point. Electronic Signature(s) Signed: 09/25/2018 9:52:35 PM By: Lenda Kelp PA-C Entered By: Lenda Kelp on 09/25/2018 09:15:14 [name] ([id number]) -------------------------------------------------------------------------------- Physician Orders Details Patient Name: Hensley, George. Date of Service: 09/25/2018 8:00 AM Medical Record Number: [id number] Patient Account Number: [id number] Date of Birth/Sex: [birth year] (86 y.o. Male) Treating RN: Arnette Norris Primary Care Provider: Bethann Punches Other Clinician: Referring Provider: Bethann Punches Treating Provider/Extender: Linwood Dibbles, HOYT Weeks in Treatment: 3 Verbal / Phone Orders: No Diagnosis Coding ICD-10 Coding Code Description S51.002A Unspecified open wound of left elbow, initial encounter S51.802A Unspecified open wound of left forearm, initial encounter S61.502A Unspecified open wound of left wrist, initial encounter S61.402A Unspecified open wound of left hand, initial encounter S81.002A Unspecified open wound, left knee, initial encounter S81.001A Unspecified open wound, right knee, initial encounter I10 Essential (primary) hypertension E11.622 Type 2 diabetes mellitus with other skin ulcer Wound Cleansing Wound #4 Left,Lateral Wrist o Clean wound with Normal Saline. o May Shower, gently pat  wound dry prior to applying new dressing. - Please use Dial antibacterial soap to wash your wounds Wound #5 Left Hand - 5th Digit o Clean wound with Normal Saline. o May Shower, gently pat wound dry prior to applying new dressing. - Please use Dial antibacterial soap to wash your wounds Wound #7 Left Knee o Clean wound with Normal Saline. o May Shower, gently pat wound dry prior to applying new dressing. - Please use Dial antibacterial soap to wash your wounds Wound #9 Left,Medial Wrist o Clean wound with Normal Saline. o May Shower, gently pat wound dry prior to applying new dressing. - Please use Dial antibacterial soap to wash your wounds Anesthetic (add to Medication List) Wound #4 Left,Lateral Wrist o Topical Lidocaine 4% cream applied to wound bed prior to debridement (In Clinic Only). Wound #5 Left Hand - 5th Digit o Topical Lidocaine 4% cream applied to wound bed prior to debridement (In Clinic Only). Wound #7 Left Knee o Topical Lidocaine 4% cream applied to wound bed prior to debridement (In Clinic Only). Wound #9 Left,Medial Wrist GLENDEL, JAGGERS ([id number]) o Topical Lidocaine 4% cream applied to wound bed prior to debridement (In Clinic Only). Primary Wound Dressing Wound #4 Left,Lateral Wrist o Mupirocin Ointment Wound #5 Left Hand - 5th Digit o Mupirocin Ointment Wound #7 Left Knee o Mupirocin Ointment Wound #9 Left,Medial Wrist o Mupirocin Ointment Secondary Dressing Wound #4 Left,Lateral Wrist o Conform/Kerlix - Stretch net o Non-adherent pad Wound #5 Left Hand - 5th Digit o Conform/Kerlix - Stretch net o Non-adherent pad Wound #7 Left Knee o Conform/Kerlix - Stretch net o Non-adherent pad Wound #9 Left,Medial Wrist o Conform/Kerlix - Stretch net o Non-adherent pad Dressing Change Frequency Wound #4 Left,Lateral Wrist o Change dressing every other day. Wound #5 Left Hand - 5th Digit o Change dressing  every other day. Wound #7 Left Knee o Change dressing every other day. Wound #9 Left,Medial Wrist o Change dressing every other day. Follow-up Appointments Wound #4 Left,Lateral Wrist o Return Appointment in 1 week. Wound #5 Left Hand - 5th Digit o Return Appointment in 1 week. Wound #7 Left Knee o  Return Appointment in 1 week. Wound #9 Left,Medial Wrist RENAY, LANGENBERG ([id number]) o Return Appointment in 1 week. Electronic Signature(s) Signed: 09/25/2018 9:52:35 PM By: Lenda Kelp PA-C Signed: 11/03/2018 10:53:34 AM By: Arnette Norris Entered By: Arnette Norris on 09/25/2018 08:33:54 [name] ([id number]) -------------------------------------------------------------------------------- Problem List Details Patient Name: KRISTOFF, CHRONISTER. Date of Service: 09/25/2018 8:00 AM Medical Record Number: [id number] Patient Account Number: [id number] Date of Birth/Sex: 03-Mar-1931 (85 y.o. Male) Treating RN: Arnette Norris Primary Care Provider: Bethann Punches Other Clinician: Referring Provider: Bethann Punches Treating Provider/Extender: Linwood Dibbles, HOYT Weeks in Treatment: 3 Active Problems ICD-10 Evaluated Encounter Code Description Active Date Today Diagnosis S51.002A Unspecified open wound of left elbow, initial encounter 09/04/2018 No Yes S51.802A Unspecified open wound of left forearm, initial encounter 09/04/2018 No Yes S61.502A Unspecified open wound of left wrist, initial encounter 09/04/2018 No Yes S61.402A Unspecified open wound of left hand, initial encounter 09/04/2018 No Yes S81.002A Unspecified open wound, left knee, initial encounter 09/04/2018 No Yes S81.001A Unspecified open wound, right knee, initial encounter 09/04/2018 No Yes I10 Essential (primary) hypertension 09/04/2018 No Yes E11.622 Type 2 diabetes mellitus with other skin ulcer 09/04/2018 No Yes Inactive Problems Resolved Problems Electronic Signature(s) Signed: 09/25/2018 9:52:35 PM By: Lenda Kelp PA-C Entered By: Lenda Kelp on 09/25/2018 08:19:14 SAVAN, CARREAU ([id number], KOSAK ([id number]) -------------------------------------------------------------------------------- Progress Note Details Patient Name: [name]. Date of Service: 09/25/2018 8:00 AM Medical Record Number: [id number] Patient Account Number: [id number] Date of Birth/Sex: July 17, 1931 (85 y.o. Male) Treating RN: Arnette Norris Primary Care Provider: Bethann Punches Other Clinician: Referring Provider: Bethann Punches Treating Provider/Extender: Linwood Dibbles, HOYT Weeks in Treatment: 3 Subjective Chief Complaint Information obtained from Patient Multiple Left UE and Bilateral LE Ulcers History of Present Illness (HPI) 09/04/18 on evaluation today patient actually appears to be doing somewhat poorly upon initial evaluation in regard to his left upper extremity in his bilateral knees. He unfortunately sustained a fall while he was at the beach this past Saturday 08/30/18. Nonetheless he was seen in the emergency department initially and then subsequently was seen by his primary care provider Dr. Hyacinth Meeker who referred him to [place] for further evaluation and treatment. Subsequently he was given a tetanus shot during the course of all this and also was placed on Flex 250 mg three times a day for 10 days by Dr. Hyacinth Meeker. Currently the patient has several wounds in varying stages of stability over the left upper extremity and the bilateral knee regions. He does have a history of diabetes mellitus type II and hypertension. 09/11/18 on evaluation today patient appears to be doing much better in regard to his multiple upper and lower Trinity ulcer's. All are measuring smaller today which is excellent news. Overall he has been tolerating the dressing changes although unfortunately he has not received his dressings. They been using the Bactroban ointment along with nonadherent pads they purchased at the  pharmacy. 09/18/18 on evaluation today patient appears to be doing rather well at this point from the standpoint of his ulcers. Fortunately there does not appear to be any evidence of infection at this time which is good news. He seems to be showing signs of improvement both in regard to the upper and lower extremities currently. No fevers, chills, nausea, or vomiting noted at this time. 09/25/18 on evaluation today patient appears to be doing very well in regard to his multiple ulcers of the upper and lower extremities. He has been tolerating the dressing  changes without complication. Fortunately there is no evidence of infection at this time. Overall very pleased with how things appear to be progressing. Several of the wounds have healed as of today. Patient History Information obtained from Patient. Social History Former smoker - cigar, Marital Status - Married, Alcohol Use - Never, Drug Use - No History, Caffeine Use - Daily. Medical History Respiratory Patient has history of Asthma Cardiovascular Patient has history of Hypertension Endocrine Patient has history of Type II Diabetes - 20 years oral meds Integumentary (Skin) Denies history of History of Burn, History of pressure wounds Medical And Surgical History Notes Eyes KAITO, [place] ([id number]) macular degeneration Cardiovascular mitral valve disorder 2000 stent placed Neurologic Stroke Review of Systems (ROS) Constitutional Symptoms (General Health) Denies complaints or symptoms of Fever, Chills. Respiratory The patient has no complaints or symptoms. Cardiovascular The patient has no complaints or symptoms. Psychiatric The patient has no complaints or symptoms. Objective Constitutional Well-nourished and well-hydrated in no acute distress. Vitals Time Taken: 8:05 AM, Height: 70 in, Weight: 160 lbs, BMI: 23, Temperature: 97.8 F, Pulse: 82 bpm, Respiratory Rate: 18 breaths/min, Blood Pressure: 145/74  mmHg. Respiratory normal breathing without difficulty. clear to auscultation bilaterally. Cardiovascular regular rate and rhythm with normal S1, S2. Psychiatric this patient is able to make decisions and demonstrates good insight into disease process. Alert and Oriented x 3. pleasant and cooperative. General Notes: Patient's wound bed currently shows signs of good epithelialization pretty much all locations and overall again he has several regions which have completely healed and ones that have not are definitely on the way. Wound overall very pleased with the progress and how things seem to be trending at this point. Integumentary (Hair, Skin) Wound #1 status is Healed - Epithelialized. Original cause of wound was Trauma. The wound is located on the Left Elbow. The wound measures 0cm length x 0cm width x 0cm depth; 0cm^2 area and 0cm^3 volume. Wound #2 status is Healed - Epithelialized. Original cause of wound was Trauma. The wound is located on the Left,Proximal Forearm. The wound measures 0cm length x 0cm width x 0cm depth; 0cm^2 area and 0cm^3 volume. Wound #3 status is Healed - Epithelialized. Original cause of wound was Trauma. The wound is located on the Left,Distal Forearm. The wound measures 0cm length x 0cm width x 0cm depth; 0cm^2 area and 0cm^3 volume. There is Fat Layer (Subcutaneous Tissue) Exposed exposed. There is no tunneling or undermining noted. There is a medium amount of serosanguineous drainage noted. The wound margin is flat and intact. There is large (67-100%) pink granulation within the [place]. ([id number]) wound bed. There is no necrotic tissue within the wound bed. The periwound skin appearance exhibited: Hemosiderin Staining. Periwound temperature was noted as No Abnormality. The periwound has tenderness on palpation. Wound #4 status is Open. Original cause of wound was Trauma. The wound is located on the Left,Lateral Wrist. The wound measures 0.3cm length  x 0.2cm width x 0.1cm depth; 0.047cm^2 area and 0.005cm^3 volume. There is Fat Layer (Subcutaneous Tissue) Exposed exposed. There is no tunneling or undermining noted. There is a medium amount of serosanguineous drainage noted. The wound margin is flat and intact. There is no granulation within the wound bed. There is a large (67-100%) amount of necrotic tissue within the wound bed including Eschar and Adherent Slough. The periwound skin appearance exhibited: Hemosiderin Staining. The periwound skin appearance did not exhibit: Callus, Crepitus, Excoriation, Induration, Rash, Scarring, Dry/Scaly, Maceration, Atrophie Blanche, Cyanosis, Ecchymosis, Mottled, Pallor,  Rubor, Erythema. Periwound temperature was noted as No Abnormality. The periwound has tenderness on palpation. Wound #5 status is Open. Original cause of wound was Trauma. The wound is located on the Left Hand - 5th Digit. The wound measures 1cm length x 0.7cm width x 0.1cm depth; 0.55cm^2 area and 0.055cm^3 volume. There is Fat Layer (Subcutaneous Tissue) Exposed exposed. There is no tunneling or undermining noted. There is a medium amount of serosanguineous drainage noted. The wound margin is flat and intact. There is no granulation within the wound bed. There is a large (67-100%) amount of necrotic tissue within the wound bed including Eschar and Adherent Slough. The periwound skin appearance exhibited: Hemosiderin Staining. The periwound skin appearance did not exhibit: Callus, Crepitus, Excoriation, Induration, Rash, Scarring, Dry/Scaly, Maceration, Atrophie Blanche, Cyanosis, Ecchymosis, Mottled, Pallor, Rubor, Erythema. Periwound temperature was noted as No Abnormality. The periwound has tenderness on palpation. Wound #6 status is Healed - Epithelialized. Original cause of wound was Trauma. The wound is located on the Left,Lateral Knee. The wound measures 0cm length x 0cm width x 0cm depth; 0cm^2 area and 0cm^3 volume. Wound #7  status is Open. Original cause of wound was Trauma. The wound is located on the Left Knee. The wound measures 2cm length x 1cm width x 0.1cm depth; 1.571cm^2 area and 0.157cm^3 volume. There is Fat Layer (Subcutaneous Tissue) Exposed exposed. There is no tunneling or undermining noted. There is a none present amount of drainage noted. The wound margin is flat and intact. There is no granulation within the wound bed. There is a large (67-100%) amount of necrotic tissue within the wound bed including Eschar and Adherent Slough. The periwound skin appearance exhibited: Erythema. The periwound skin appearance did not exhibit: Callus, Crepitus, Excoriation, Induration, Rash, Scarring, Dry/Scaly, Maceration, Atrophie Blanche, Cyanosis, Ecchymosis, Hemosiderin Staining, Mottled, Pallor, Rubor. The surrounding wound skin color is noted with erythema which is circumferential. Periwound temperature was noted as No Abnormality. The periwound has tenderness on palpation. Wound #8 status is Healed - Epithelialized. Original cause of wound was Trauma. The wound is located on the Right Knee. The wound measures 0cm length x 0cm width x 0cm depth; 0cm^2 area and 0cm^3 volume. Wound #9 status is Open. Original cause of wound was Trauma. The wound is located on the Left,Medial Wrist. The wound measures 0.5cm length x 1cm width x 0.1cm depth; 0.393cm^2 area and 0.039cm^3 volume. There is Fat Layer (Subcutaneous Tissue) Exposed exposed. There is no tunneling noted. There is a medium amount of serous drainage noted. The wound margin is flat and intact. There is no granulation within the wound bed. There is a large (67-100%) amount of necrotic tissue within the wound bed including Eschar. The periwound skin appearance did not exhibit: Callus, Crepitus, Excoriation, Induration, Rash, Scarring, Dry/Scaly, Maceration, Atrophie Blanche, Cyanosis, Ecchymosis, Hemosiderin Staining, Mottled, Pallor, Rubor,  Erythema. Assessment Active Problems ICD-10 Unspecified open wound of left elbow, initial encounter Unspecified open wound of left forearm, initial encounter Unspecified open wound of left wrist, initial encounter Unspecified open wound of left hand, initial encounter Unspecified open wound, left knee, initial encounter Unspecified open wound, right knee, initial encounter OSBORNE, SERIO ([id number]) Essential (primary) hypertension Type 2 diabetes mellitus with other skin ulcer Plan Wound Cleansing: Wound #4 Left,Lateral Wrist: Clean wound with Normal Saline. May Shower, gently pat wound dry prior to applying new dressing. - Please use Dial antibacterial soap to wash your wounds Wound #5 Left Hand - 5th Digit: Clean wound with Normal Saline. May Shower,  gently pat wound dry prior to applying new dressing. - Please use Dial antibacterial soap to wash your wounds Wound #7 Left Knee: Clean wound with Normal Saline. May Shower, gently pat wound dry prior to applying new dressing. - Please use Dial antibacterial soap to wash your wounds Wound #9 Left,Medial Wrist: Clean wound with Normal Saline. May Shower, gently pat wound dry prior to applying new dressing. - Please use Dial antibacterial soap to wash your wounds Anesthetic (add to Medication List): Wound #4 Left,Lateral Wrist: Topical Lidocaine 4% cream applied to wound bed prior to debridement (In Clinic Only). Wound #5 Left Hand - 5th Digit: Topical Lidocaine 4% cream applied to wound bed prior to debridement (In Clinic Only). Wound #7 Left Knee: Topical Lidocaine 4% cream applied to wound bed prior to debridement (In Clinic Only). Wound #9 Left,Medial Wrist: Topical Lidocaine 4% cream applied to wound bed prior to debridement (In Clinic Only). Primary Wound Dressing: Wound #4 Left,Lateral Wrist: Mupirocin Ointment Wound #5 Left Hand - 5th Digit: Mupirocin Ointment Wound #7 Left Knee: Mupirocin Ointment Wound #9  Left,Medial Wrist: Mupirocin Ointment Secondary Dressing: Wound #4 Left,Lateral Wrist: Conform/Kerlix - Stretch net Non-adherent pad Wound #5 Left Hand - 5th Digit: Conform/Kerlix - Stretch net Non-adherent pad Wound #7 Left Knee: Conform/Kerlix - Stretch net Non-adherent pad Wound #9 Left,Medial Wrist: Conform/Kerlix - Stretch net Non-adherent pad Dressing Change Frequency: Wound #4 Left,Lateral Wrist: Change dressing every other day. Wound #5 Left Hand - 5th Digit: Change dressing every other day. KELL, FERRIS ([id number]) Wound #7 Left Knee: Change dressing every other day. Wound #9 Left,Medial Wrist: Change dressing every other day. Follow-up Appointments: Wound #4 Left,Lateral Wrist: Return Appointment in 1 week. Wound #5 Left Hand - 5th Digit: Return Appointment in 1 week. Wound #7 Left Knee: Return Appointment in 1 week. Wound #9 Left,Medial Wrist: Return Appointment in 1 week. My suggestion is gonna be that we continue with the above wound care measures for the next week and the patient is in agreement that plan. If anything changes in the meantime he will let us know otherwise will see were things stand at follow- up. Please see above for specific wound care orders. We will see patient for re-evaluation in 1 week(s) here in the clinic. If anything worsens or changes patient will contact our office for additional recommendations. Electronic Signature(s) Signed: 09/25/2018 9:52:35 PM By: Lenda Kelp PA-C Entered By: Lenda Kelp on 09/25/2018 09:15:36 [name] ([id number]) -------------------------------------------------------------------------------- ROS/PFSH Details Patient Name: NICODEMUS, DENK. Date of Service: 09/25/2018 8:00 AM Medical Record Number: [id number] Patient Account Number: [id number] Date of Birth/Sex: 1930/09/20 (85 y.o. Male) Treating RN: Arnette Norris Primary Care Provider: Bethann Punches Other Clinician: Referring  Provider: Bethann Punches Treating Provider/Extender: Linwood Dibbles, HOYT Weeks in Treatment: 3 Information Obtained From Patient Wound History Do you currently have one or more open woundso Yes How many open wounds do you currently haveo 6 Approximately how long have you had your woundso 1 week How have you been treating your wound(s) until nowo non adherant dressing, compression Has your wound(s) ever healed and then re-openedo No Have you had any lab work done in the past montho No Have you tested positive for an antibiotic resistant organism (MRSA, VRE)o No Have you tested positive for osteomyelitis (bone infection)o No Have you had any tests for circulation on your legso No Constitutional Symptoms (General Health) Complaints and Symptoms: Negative for: Fever; Chills Eyes Medical History: Past Medical History Notes: macular degeneration  Respiratory Complaints and Symptoms: No Complaints or Symptoms Medical History: Positive for: Asthma Cardiovascular Complaints and Symptoms: No Complaints or Symptoms Medical History: Positive for: Hypertension Past Medical History Notes: mitral valve disorder 2000 stent placed Endocrine Medical History: Positive for: Type II Diabetes - 20 years oral meds Time with diabetes: 20 years Treated with: Oral agents NOLTON, DENIS ([id number]) Blood sugar tested every day: No Integumentary (Skin) Medical History: Negative for: History of Burn; History of pressure wounds Neurologic Medical History: Past Medical History Notes: Stroke Psychiatric Complaints and Symptoms: No Complaints or Symptoms Immunizations Pneumococcal Vaccine: Received Pneumococcal Vaccination: Yes Implantable Devices Family and Social History Former smoker - cigar; Marital Status - Married; Alcohol Use: Never; Drug Use: No History; Caffeine Use: Daily; Financial Concerns: No; Food, Clothing or Shelter Needs: No; Support System Lacking: No; Transportation Concerns:  No Physician Affirmation I have reviewed and agree with the above information. Electronic Signature(s) Signed: 09/25/2018 9:52:35 PM By: Lenda Kelp PA-C Signed: 11/03/2018 10:53:34 AM By: Arnette Norris Entered By: Lenda Kelp on 09/25/2018 09:14:54 [name] ([id number]) -------------------------------------------------------------------------------- SuperBill Details Patient Name: JACARI, IANNELLO. Date of Service: 09/25/2018 Medical Record Number: [id number] Patient Account Number: [id number] Date of Birth/Sex: Jan 25, 1931 (85 y.o. Male) Treating RN: Arnette Norris Primary Care Provider: Bethann Punches Other Clinician: Referring Provider: Bethann Punches Treating Provider/Extender: Linwood Dibbles, HOYT Weeks in Treatment: 3 Diagnosis Coding ICD-10 Codes Code Description S51.002A Unspecified open wound of left elbow, initial encounter S51.802A Unspecified open wound of left forearm, initial encounter S61.502A Unspecified open wound of left wrist, initial encounter S61.402A Unspecified open wound of left hand, initial encounter S81.002A Unspecified open wound, left knee, initial encounter S81.001A Unspecified open wound, right knee, initial encounter I10 Essential (primary) hypertension E11.622 Type 2 diabetes mellitus with other skin ulcer Facility Procedures CPT4 Code: 08657846 Description: [phone] - WOUND CARE VISIT-LEV 5 EST PT Modifier: Quantity: 1 Physician Procedures CPT4 Code: 2841324 Description: 99214 - WC PHYS LEVEL 4 - EST PT ICD-10 Diagnosis Description S51.002A Unspecified open wound of left elbow, initial encounter S51.802A Unspecified open wound of left forearm, initial encounter S61.502A Unspecified open wound of left wrist,  initial encounter S61.402A Unspecified open wound of left hand, initial encounter Modifier: Quantity: 1 Electronic Signature(s) Signed: 09/26/2018 1:52:57 PM By: Francie Massing Signed: 09/26/2018 4:22:32 PM By: Lenda Kelp  PA-C Previous Signature: 09/25/2018 9:52:35 PM Version By: Lenda Kelp PA-C Entered By: Francie Massing on 09/26/2018 13:52:56

## 2018-11-14 DIAGNOSIS — J439 Emphysema, unspecified: Secondary | ICD-10-CM | POA: Diagnosis not present

## 2018-11-14 DIAGNOSIS — J471 Bronchiectasis with (acute) exacerbation: Secondary | ICD-10-CM | POA: Diagnosis not present

## 2018-11-14 DIAGNOSIS — J4540 Moderate persistent asthma, uncomplicated: Secondary | ICD-10-CM | POA: Diagnosis not present

## 2018-12-12 DIAGNOSIS — J441 Chronic obstructive pulmonary disease with (acute) exacerbation: Secondary | ICD-10-CM | POA: Diagnosis not present

## 2018-12-15 DIAGNOSIS — J471 Bronchiectasis with (acute) exacerbation: Secondary | ICD-10-CM | POA: Diagnosis not present

## 2018-12-15 DIAGNOSIS — J4540 Moderate persistent asthma, uncomplicated: Secondary | ICD-10-CM | POA: Diagnosis not present

## 2018-12-15 DIAGNOSIS — J439 Emphysema, unspecified: Secondary | ICD-10-CM | POA: Diagnosis not present

## 2019-01-14 DIAGNOSIS — J471 Bronchiectasis with (acute) exacerbation: Secondary | ICD-10-CM | POA: Diagnosis not present

## 2019-01-14 DIAGNOSIS — J4540 Moderate persistent asthma, uncomplicated: Secondary | ICD-10-CM | POA: Diagnosis not present

## 2019-01-14 DIAGNOSIS — J439 Emphysema, unspecified: Secondary | ICD-10-CM | POA: Diagnosis not present

## 2019-02-03 DIAGNOSIS — E1169 Type 2 diabetes mellitus with other specified complication: Secondary | ICD-10-CM | POA: Diagnosis not present

## 2019-02-03 DIAGNOSIS — Z125 Encounter for screening for malignant neoplasm of prostate: Secondary | ICD-10-CM | POA: Diagnosis not present

## 2019-02-03 DIAGNOSIS — E538 Deficiency of other specified B group vitamins: Secondary | ICD-10-CM | POA: Diagnosis not present

## 2019-02-03 DIAGNOSIS — E782 Mixed hyperlipidemia: Secondary | ICD-10-CM | POA: Diagnosis not present

## 2019-02-11 DIAGNOSIS — E538 Deficiency of other specified B group vitamins: Secondary | ICD-10-CM | POA: Diagnosis not present

## 2019-02-11 DIAGNOSIS — Z0000 Encounter for general adult medical examination without abnormal findings: Secondary | ICD-10-CM | POA: Diagnosis not present

## 2019-02-11 DIAGNOSIS — E1169 Type 2 diabetes mellitus with other specified complication: Secondary | ICD-10-CM | POA: Diagnosis not present

## 2019-02-11 DIAGNOSIS — J479 Bronchiectasis, uncomplicated: Secondary | ICD-10-CM | POA: Diagnosis not present

## 2019-02-14 DIAGNOSIS — J439 Emphysema, unspecified: Secondary | ICD-10-CM | POA: Diagnosis not present

## 2019-02-14 DIAGNOSIS — J4540 Moderate persistent asthma, uncomplicated: Secondary | ICD-10-CM | POA: Diagnosis not present

## 2019-02-14 DIAGNOSIS — J471 Bronchiectasis with (acute) exacerbation: Secondary | ICD-10-CM | POA: Diagnosis not present

## 2019-02-23 DIAGNOSIS — E1169 Type 2 diabetes mellitus with other specified complication: Secondary | ICD-10-CM | POA: Diagnosis not present

## 2019-02-23 DIAGNOSIS — I2510 Atherosclerotic heart disease of native coronary artery without angina pectoris: Secondary | ICD-10-CM | POA: Diagnosis not present

## 2019-02-23 DIAGNOSIS — I10 Essential (primary) hypertension: Secondary | ICD-10-CM | POA: Diagnosis not present

## 2019-02-23 DIAGNOSIS — E782 Mixed hyperlipidemia: Secondary | ICD-10-CM | POA: Diagnosis not present

## 2019-03-16 DIAGNOSIS — J471 Bronchiectasis with (acute) exacerbation: Secondary | ICD-10-CM | POA: Diagnosis not present

## 2019-03-16 DIAGNOSIS — J4540 Moderate persistent asthma, uncomplicated: Secondary | ICD-10-CM | POA: Diagnosis not present

## 2019-03-16 DIAGNOSIS — J439 Emphysema, unspecified: Secondary | ICD-10-CM | POA: Diagnosis not present

## 2019-03-18 DIAGNOSIS — H16201 Unspecified keratoconjunctivitis, right eye: Secondary | ICD-10-CM | POA: Diagnosis not present

## 2019-04-01 DIAGNOSIS — H16201 Unspecified keratoconjunctivitis, right eye: Secondary | ICD-10-CM | POA: Diagnosis not present

## 2019-04-16 DIAGNOSIS — J471 Bronchiectasis with (acute) exacerbation: Secondary | ICD-10-CM | POA: Diagnosis not present

## 2019-04-16 DIAGNOSIS — J4540 Moderate persistent asthma, uncomplicated: Secondary | ICD-10-CM | POA: Diagnosis not present

## 2019-04-16 DIAGNOSIS — J439 Emphysema, unspecified: Secondary | ICD-10-CM | POA: Diagnosis not present

## 2019-05-07 DIAGNOSIS — E1165 Type 2 diabetes mellitus with hyperglycemia: Secondary | ICD-10-CM | POA: Diagnosis not present

## 2019-05-15 DIAGNOSIS — J449 Chronic obstructive pulmonary disease, unspecified: Secondary | ICD-10-CM | POA: Diagnosis not present

## 2019-05-15 DIAGNOSIS — E119 Type 2 diabetes mellitus without complications: Secondary | ICD-10-CM | POA: Diagnosis not present

## 2019-05-17 DIAGNOSIS — J439 Emphysema, unspecified: Secondary | ICD-10-CM | POA: Diagnosis not present

## 2019-05-17 DIAGNOSIS — J4540 Moderate persistent asthma, uncomplicated: Secondary | ICD-10-CM | POA: Diagnosis not present

## 2019-05-17 DIAGNOSIS — J471 Bronchiectasis with (acute) exacerbation: Secondary | ICD-10-CM | POA: Diagnosis not present

## 2019-06-16 DIAGNOSIS — J439 Emphysema, unspecified: Secondary | ICD-10-CM | POA: Diagnosis not present

## 2019-06-16 DIAGNOSIS — J4540 Moderate persistent asthma, uncomplicated: Secondary | ICD-10-CM | POA: Diagnosis not present

## 2019-06-16 DIAGNOSIS — J471 Bronchiectasis with (acute) exacerbation: Secondary | ICD-10-CM | POA: Diagnosis not present

## 2019-07-17 DIAGNOSIS — J471 Bronchiectasis with (acute) exacerbation: Secondary | ICD-10-CM | POA: Diagnosis not present

## 2019-07-17 DIAGNOSIS — J4540 Moderate persistent asthma, uncomplicated: Secondary | ICD-10-CM | POA: Diagnosis not present

## 2019-07-17 DIAGNOSIS — J439 Emphysema, unspecified: Secondary | ICD-10-CM | POA: Diagnosis not present

## 2019-08-16 DIAGNOSIS — J4540 Moderate persistent asthma, uncomplicated: Secondary | ICD-10-CM | POA: Diagnosis not present

## 2019-08-16 DIAGNOSIS — J439 Emphysema, unspecified: Secondary | ICD-10-CM | POA: Diagnosis not present

## 2019-08-16 DIAGNOSIS — J471 Bronchiectasis with (acute) exacerbation: Secondary | ICD-10-CM | POA: Diagnosis not present

## 2019-10-24 ENCOUNTER — Encounter: Payer: Self-pay | Admitting: Emergency Medicine

## 2019-10-24 ENCOUNTER — Emergency Department
Admission: EM | Admit: 2019-10-24 | Discharge: 2019-10-24 | Disposition: A | Discharge status: Home / Self Care | Payer: No Typology Code available for payment source | Attending: Emergency Medicine | Admitting: Emergency Medicine

## 2019-10-24 ENCOUNTER — Other Ambulatory Visit: Payer: Self-pay

## 2019-10-24 DIAGNOSIS — F17220 Nicotine dependence, chewing tobacco, uncomplicated: Secondary | ICD-10-CM | POA: Insufficient documentation

## 2019-10-24 DIAGNOSIS — E119 Type 2 diabetes mellitus without complications: Secondary | ICD-10-CM | POA: Insufficient documentation

## 2019-10-24 DIAGNOSIS — W268XXA Contact with other sharp object(s), not elsewhere classified, initial encounter: Secondary | ICD-10-CM | POA: Insufficient documentation

## 2019-10-24 DIAGNOSIS — Z23 Encounter for immunization: Secondary | ICD-10-CM | POA: Insufficient documentation

## 2019-10-24 DIAGNOSIS — S81811A Laceration without foreign body, right lower leg, initial encounter: Secondary | ICD-10-CM | POA: Insufficient documentation

## 2019-10-24 DIAGNOSIS — J449 Chronic obstructive pulmonary disease, unspecified: Secondary | ICD-10-CM | POA: Insufficient documentation

## 2019-10-24 DIAGNOSIS — Y9389 Activity, other specified: Secondary | ICD-10-CM | POA: Insufficient documentation

## 2019-10-24 DIAGNOSIS — Y929 Unspecified place or not applicable: Secondary | ICD-10-CM | POA: Diagnosis not present

## 2019-10-24 DIAGNOSIS — Z7982 Long term (current) use of aspirin: Secondary | ICD-10-CM | POA: Diagnosis not present

## 2019-10-24 DIAGNOSIS — Y999 Unspecified external cause status: Secondary | ICD-10-CM | POA: Insufficient documentation

## 2019-10-24 DIAGNOSIS — Z79899 Other long term (current) drug therapy: Secondary | ICD-10-CM | POA: Diagnosis not present

## 2019-10-24 DIAGNOSIS — Z7984 Long term (current) use of oral hypoglycemic drugs: Secondary | ICD-10-CM | POA: Insufficient documentation

## 2019-10-24 DIAGNOSIS — J45909 Unspecified asthma, uncomplicated: Secondary | ICD-10-CM | POA: Diagnosis not present

## 2019-10-24 DIAGNOSIS — S8991XA Unspecified injury of right lower leg, initial encounter: Secondary | ICD-10-CM | POA: Diagnosis present

## 2019-10-24 MED ORDER — TETANUS-DIPHTH-ACELL PERTUSSIS 5-2.5-18.5 LF-MCG/0.5 IM SUSP
0.5000 mL | Freq: Once | INTRAMUSCULAR | Status: AC
  Administered 2019-10-24: 0.5 mL via INTRAMUSCULAR
  Filled 2019-10-24: qty 0.5

## 2019-10-24 MED ORDER — LIDOCAINE-EPINEPHRINE 1 %-1:100000 IJ SOLN
30.0000 mL | Freq: Once | INTRAMUSCULAR | Status: AC
  Administered 2019-10-24: 15:00:00 1 mL
  Filled 2019-10-24: qty 2

## 2019-10-24 MED ORDER — CEPHALEXIN 500 MG PO CAPS: 500.0000 mg | ORAL_CAPSULE | Freq: Three times a day (TID) | ORAL | 0 refills | Status: AC

## 2019-10-24 NOTE — ED Notes (Signed)
Pt reports shutting his car door on his leg today and has a 3 inch gash to his right anterior lower leg.

## 2019-10-24 NOTE — Discharge Instructions (Addendum)
You may shower and get laceration site wet but do not submerge underwater.  Cleanse laceration daily with either hydrogen peroxide or alcohol.  Keep laceration covered with a dressing or Band-Aid daily.  Take antibiotic as prescribed to prevent infection.  Return to the ER for any increasing pain, swelling warmth redness drainage or fevers.

## 2019-10-24 NOTE — ED Triage Notes (Signed)
FIRST NURSE NOTE:  Laceration noted to right lower leg, bleeding controlled at this time, bandage in place from home.

## 2019-10-24 NOTE — ED Provider Notes (Signed)
[facility] EMERGENCY DEPARTMENT Provider Note   CSN: [id number] Arrival date & time: 10/24/19  1309     History Chief Complaint  Patient presents with  . Laceration    George Hensley is a 86 y.o. male for evaluation of right lower extremity laceration.  Patient suffered a large skin tear/laceration to the right lower leg as he was closing his car door, the corner of the door hit his lower leg, patient was wearing long pants.  No hole in the pants.  His tetanus is not known to be up-to-date.  He denies any significant pain or discomfort.  Denies any issues with ambulation.  States he is here for the laceration.  Denies any joint or bone pain.  No other injury to his body.  Denies falling.  HPI     Past Medical History:  Diagnosis Date  . Asthma   . Bronchiectasis ([place])   . COPD (chronic obstructive pulmonary disease) ([place])   . Diabetes mellitus     Patient Active Problem List   Diagnosis Date Noted  . Acute on chronic respiratory failure with hypoxia and hypercapnia ([place]) 05/29/2018  . CAP (community acquired pneumonia) 10/12/2017  . Dyspnea 06/26/2016  . Asthma exacerbation 06/26/2016  . Hypoxia 06/12/2016  . Acute on chronic respiratory failure with hypoxia ([place]) 08/02/2015  . Asthma 04/04/2011  . Pulmonary nodule 03/06/2011    Past Surgical History:  Procedure Laterality Date  . CHOLECYSTECTOMY  2000  . CORONARY STENT PLACEMENT  1999       Family History  Problem Relation Age of Onset  . Asthma Brother     Social History   Tobacco Use  . Smoking status: Never Smoker  . Smokeless tobacco: Current User    Types: Chew  Substance Use Topics  . Alcohol use: Yes    Comment: socially last drink 2 weeks ago  . Drug use: No    Home Medications Prior to Admission medications   Medication Sig Start Date End Date Taking? Authorizing Provider  aspirin EC 81 MG tablet Take 81 mg by mouth daily.  09/22/12   [provider]    cephALEXin (KEFLEX) 500 MG capsule Take 1 capsule (500 mg total) by mouth 3 (three) times daily for 7 days. 10/24/19 10/31/19  Duanne Guess, PA-C  Cholecalciferol (VITAMIN D-1000 MAX ST) 1000 units tablet Take 1 tablet by mouth 3 (three) times a week.    [provider]  diltiazem (CARDIZEM SR) 120 MG 12 hr capsule Take 120 mg by mouth daily.    [provider]  empagliflozin (JARDIANCE) 25 MG TABS tablet Take 25 mg by mouth daily.    [provider]  finasteride (PROSCAR) 5 MG tablet Take 5 mg by mouth daily.    [provider]  glimepiride (AMARYL) 4 MG tablet Take 4 mg by mouth 2 (two) times daily.    [provider]  ketorolac (ACULAR) 0.5 % ophthalmic solution Place 1 drop into the left eye 3 (three) times daily. 10/05/17   [provider]  metFORMIN (GLUCOPHAGE) 1000 MG tablet Take 1,000 mg by mouth 2 (two) times daily.    [provider]  mometasone-formoterol (DULERA) 200-5 MCG/ACT AERO Inhale 2 puffs into the lungs 2 (two) times daily. 06/27/16   Hillary Bow, MD  ofloxacin (OCUFLOX) 0.3 % ophthalmic solution Place 1 drop into the left eye 3 (three) times daily. 10/05/17   [provider]  ondansetron (ZOFRAN ODT) 4 MG disintegrating  tablet Allow 1-2 tablets to dissolve in your mouth every 8 hours as needed for nausea/vomiting 08/08/18   Loleta Rose, MD  prednisoLONE acetate (PRED FORTE) 1 % ophthalmic suspension Place 1 drop into the left eye 3 (three) times daily. 10/05/17   [provider]  predniSONE (STERAPRED UNI-PAK 21 TAB) 10 MG (21) TBPK tablet Take 6 tabs first day, 5 tab on day 2, then 4 on day 3rd, 3 tabs on day 4th , 2 tab on day 5th, and 1 tab on 6th day.- after that cont 10 mg daily, as was taking at home. 05/30/18   Altamese Dilling, MD  sitaGLIPtin (JANUVIA) 100 MG tablet Take 1 tablet by mouth daily.    [provider]  tiotropium (SPIRIVA) 18 MCG inhalation capsule Place 18 mcg  into inhaler and inhale daily.    [provider]  VENTOLIN HFA 108 (90 Base) MCG/ACT inhaler Inhale 1 puff into the lungs every 6 (six) hours as needed. 06/25/16   [provider]    Allergies    Metoprolol  Review of Systems   Review of Systems  Constitutional: Negative for chills and fever.  Musculoskeletal: Negative for arthralgias, gait problem, joint swelling and myalgias.  Skin: Positive for wound. Negative for color change and rash.  Neurological: Negative for dizziness and headaches.    Physical Exam Updated Vital Signs BP 129/71 (BP Location: Left Arm)   Pulse 80   Temp 98.5 F (36.9 C) (Oral)   Resp 18   Ht 5\' 10"  (1.778 m)   Wt 72.6 kg   SpO2 96%   BMI 22.96 kg/m   Physical Exam Constitutional:      Appearance: He is well-developed.  HENT:     Head: Normocephalic and atraumatic.  Eyes:     Conjunctiva/sclera: Conjunctivae normal.  Cardiovascular:     Rate and Rhythm: Normal rate.  Pulmonary:     Effort: Pulmonary effort is normal. No respiratory distress.  Musculoskeletal:     Cervical back: Normal range of motion.     Comments: Right lower extremity with V-shaped laceration to the anterior middle third of the right lower leg.  Bleeding well controlled.  No swelling of the compartments.  Ankle plantarflexion dorsiflexion is intact.  Laceration is clean, no visible or palpable foreign body.  Laceration measures 3+6+2 cm.  Skin:    General: Skin is warm.     Findings: No rash.  Neurological:     Mental Status: He is alert and oriented to person, place, and time.  Psychiatric:        Behavior: Behavior normal.        Thought Content: Thought content normal.     ED Results / Procedures / Treatments   Labs (all labs ordered are listed, but only abnormal results are displayed) Labs Reviewed - No data to display  EKG None  Radiology No results found.  Procedures . Laceration Repair  Date/Time: 10/24/2019 3:20 PM Performed by:  10/26/2019, PA-C Authorized by: Evon Slack, PA-C   Consent:    Consent obtained:  Verbal   Consent given by:  Patient   Risks discussed:  Infection and pain   Alternatives discussed:  No treatment Anesthesia (see MAR for exact dosages):    Anesthesia method:  Local infiltration   Local anesthetic:  Lidocaine 1% WITH epi Laceration details:    Location:  Leg   Leg location:  R lower leg   Length (cm):  11   Depth (mm):  3 Repair type:    Repair type:  Simple Pre-procedure details:    Preparation:  Patient was prepped and draped in usual sterile fashion Exploration:    Hemostasis achieved with:  LET   Wound exploration: wound explored through full range of motion     Contaminated: no   Treatment:    Area cleansed with:  Betadine and saline   Amount of cleaning:  Extensive   Irrigation solution:  Sterile saline   Irrigation volume:  60   Irrigation method:  Pressure wash   Visualized foreign bodies/material removed: no   Skin repair:    Repair method:  Sutures   Suture size:  5-0   Suture material:  Nylon   Suture technique:  Simple interrupted   Number of sutures:  16 Approximation:    Approximation:  Close Post-procedure details:    Dressing:  Non-adherent dressing and bulky dressing   Patient tolerance of procedure:  Tolerated well, no immediate complications Comments:     Ace wrap applied   (including critical care time)  Medications Ordered in ED Medications  lidocaine-EPINEPHrine (XYLOCAINE W/EPI) 1 %-1:100000 (with pres) injection 30 mL (has no administration in time range)  Tdap (BOOSTRIX) injection 0.5 mL (0.5 mLs Intramuscular Given 10/24/19 1411)    ED Course  I have reviewed the triage vital signs and the nursing notes.  Pertinent labs & imaging results that were available during my care of the patient were reviewed by me and considered in my medical decision making (see chart for details).    MDM Rules/Calculators/A&P                       86 year old male with laceration to the right lower extremity.  No pain or concern for fracture.  Compartments are soft.  No tendon deficits.  Laceration thoroughly irrigated and repaired with 16 nylon sutures.  Tetanus is updated in the ED.  He was placed on a prophylactic antibiotic.  He will follow-up in 10 to 14 days with PCP or walk-in clinic for suture removal and Steri-Strip application.  He understands signs and symptoms to return to ED for. Final Clinical Impression(s) / ED Diagnoses Final diagnoses:  Leg laceration, right, initial encounter    Rx / DC Orders ED Discharge Orders         Ordered    cephALEXin (KEFLEX) 500 MG capsule  3 times daily     10/24/19 1514           Ronnette Juniper 10/24/19 1524    Minna Antis, MD 10/25/19 1244

## 2019-11-02 ENCOUNTER — Ambulatory Visit: Payer: No Typology Code available for payment source | Admitting: Physician Assistant

## 2020-01-11 IMAGING — DX DG CHEST 1V PORT
1 series · 1 of 1 positions shown · non-contrast
Comparison: 10/12/2017 chest radiograph.

CLINICAL DATA: 86 y/o  M; respiratory distress.

EXAM:
PORTABLE CHEST 1 VIEW

[chest ap]
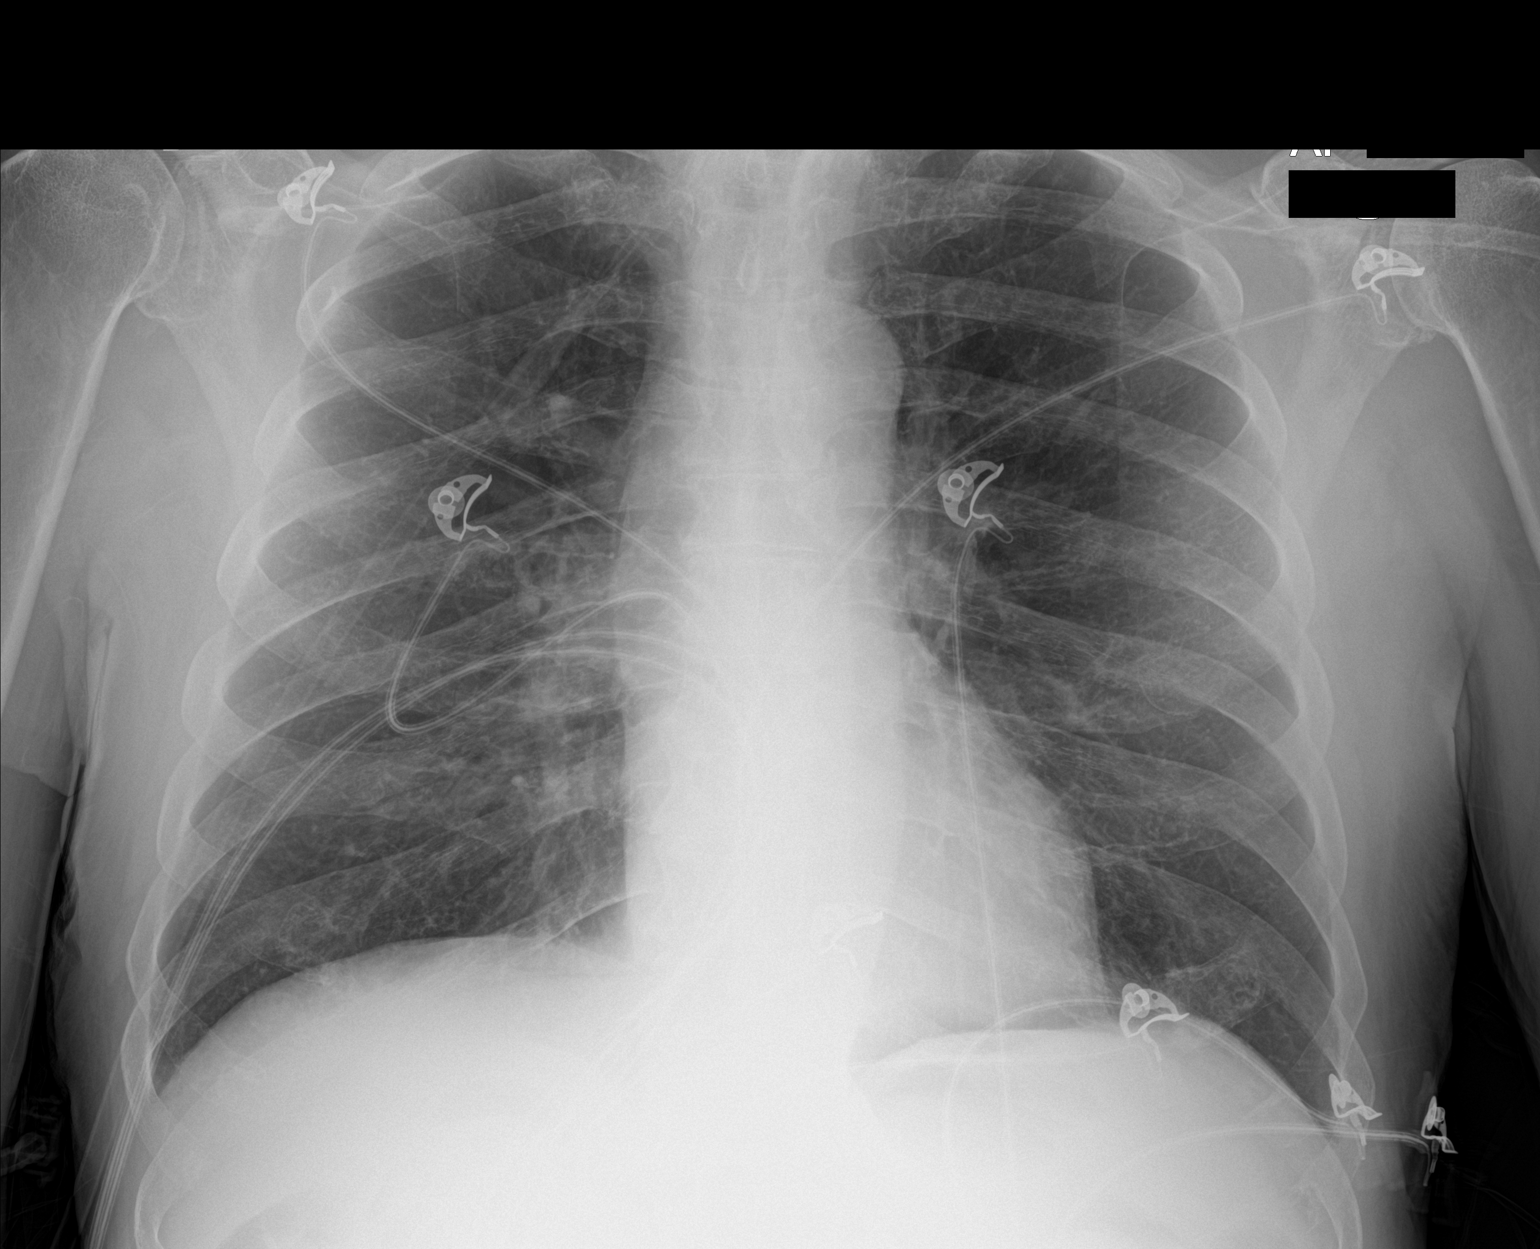

[1 of 1 positions shown; findings below may reference images not displayed]

FINDINGS: Stable heart size and mediastinal contours are within normal limits.
Both lungs are clear. The visualized skeletal structures are
unremarkable.
IMPRESSION: No active disease.

By: Hard Tiger M.D.

## 2020-07-31 ENCOUNTER — Inpatient Hospital Stay
Admission: EM | Admit: 2020-07-31 | Discharge: 2020-08-03 | DRG: 177 | Disposition: A | Discharge status: Home / Self Care | Payer: Medicare Other | Attending: Hospitalist | Admitting: Hospitalist

## 2020-07-31 ENCOUNTER — Encounter: Payer: Self-pay | Admitting: Emergency Medicine

## 2020-07-31 ENCOUNTER — Other Ambulatory Visit: Payer: Self-pay

## 2020-07-31 ENCOUNTER — Emergency Department: Payer: Medicare Other

## 2020-07-31 DIAGNOSIS — Z955 Presence of coronary angioplasty implant and graft: Secondary | ICD-10-CM

## 2020-07-31 DIAGNOSIS — I214 Non-ST elevation (NSTEMI) myocardial infarction: Secondary | ICD-10-CM | POA: Diagnosis present

## 2020-07-31 DIAGNOSIS — J441 Chronic obstructive pulmonary disease with (acute) exacerbation: Secondary | ICD-10-CM | POA: Diagnosis present

## 2020-07-31 DIAGNOSIS — Z79899 Other long term (current) drug therapy: Secondary | ICD-10-CM | POA: Diagnosis not present

## 2020-07-31 DIAGNOSIS — I5022 Chronic systolic (congestive) heart failure: Secondary | ICD-10-CM | POA: Diagnosis present

## 2020-07-31 DIAGNOSIS — Z9049 Acquired absence of other specified parts of digestive tract: Secondary | ICD-10-CM

## 2020-07-31 DIAGNOSIS — E119 Type 2 diabetes mellitus without complications: Secondary | ICD-10-CM | POA: Diagnosis present

## 2020-07-31 DIAGNOSIS — J479 Bronchiectasis, uncomplicated: Secondary | ICD-10-CM

## 2020-07-31 DIAGNOSIS — J45901 Unspecified asthma with (acute) exacerbation: Secondary | ICD-10-CM | POA: Diagnosis present

## 2020-07-31 DIAGNOSIS — J1282 Pneumonia due to coronavirus disease 2019: Secondary | ICD-10-CM | POA: Diagnosis present

## 2020-07-31 DIAGNOSIS — R778 Other specified abnormalities of plasma proteins: Secondary | ICD-10-CM | POA: Diagnosis present

## 2020-07-31 DIAGNOSIS — Z7984 Long term (current) use of oral hypoglycemic drugs: Secondary | ICD-10-CM | POA: Diagnosis not present

## 2020-07-31 DIAGNOSIS — A419 Sepsis, unspecified organism: Secondary | ICD-10-CM | POA: Diagnosis present

## 2020-07-31 DIAGNOSIS — Z7982 Long term (current) use of aspirin: Secondary | ICD-10-CM | POA: Diagnosis not present

## 2020-07-31 DIAGNOSIS — J4551 Severe persistent asthma with (acute) exacerbation: Secondary | ICD-10-CM | POA: Diagnosis not present

## 2020-07-31 DIAGNOSIS — I429 Cardiomyopathy, unspecified: Secondary | ICD-10-CM | POA: Diagnosis present

## 2020-07-31 DIAGNOSIS — J9621 Acute and chronic respiratory failure with hypoxia: Secondary | ICD-10-CM | POA: Diagnosis present

## 2020-07-31 DIAGNOSIS — I2510 Atherosclerotic heart disease of native coronary artery without angina pectoris: Secondary | ICD-10-CM | POA: Diagnosis present

## 2020-07-31 DIAGNOSIS — I959 Hypotension, unspecified: Secondary | ICD-10-CM | POA: Diagnosis present

## 2020-07-31 DIAGNOSIS — E876 Hypokalemia: Secondary | ICD-10-CM | POA: Diagnosis present

## 2020-07-31 DIAGNOSIS — J9601 Acute respiratory failure with hypoxia: Secondary | ICD-10-CM | POA: Diagnosis present

## 2020-07-31 DIAGNOSIS — R7989 Other specified abnormal findings of blood chemistry: Secondary | ICD-10-CM | POA: Diagnosis present

## 2020-07-31 DIAGNOSIS — J439 Emphysema, unspecified: Secondary | ICD-10-CM | POA: Diagnosis present

## 2020-07-31 DIAGNOSIS — Z9981 Dependence on supplemental oxygen: Secondary | ICD-10-CM

## 2020-07-31 DIAGNOSIS — U071 COVID-19: Principal | ICD-10-CM | POA: Diagnosis present

## 2020-07-31 LAB — CBC WITH DIFFERENTIAL/PLATELET
Abs Immature Granulocytes: 0.03 10*3/uL (ref 0.00–0.07)
Basophils Absolute: 0 10*3/uL (ref 0.0–0.1)
Basophils Relative: 0 %
Eosinophils Absolute: 0.1 10*3/uL (ref 0.0–0.5)
Eosinophils Relative: 2 %
HCT: 43.1 % (ref 39.0–52.0)
Hemoglobin: 14 g/dL (ref 13.0–17.0)
Immature Granulocytes: 0 %
Lymphocytes Relative: 5 %
Lymphs Abs: 0.5 10*3/uL — ABNORMAL LOW (ref 0.7–4.0)
MCH: 30.6 pg (ref 26.0–34.0)
MCHC: 32.5 g/dL (ref 30.0–36.0)
MCV: 94.3 fL (ref 80.0–100.0)
Monocytes Absolute: 0.3 10*3/uL (ref 0.1–1.0)
Monocytes Relative: 4 %
Neutro Abs: 8.2 10*3/uL — ABNORMAL HIGH (ref 1.7–7.7)
Neutrophils Relative %: 89 %
Platelets: 255 10*3/uL (ref 150–400)
RBC: 4.57 MIL/uL (ref 4.22–5.81)
RDW: 13.6 % (ref 11.5–15.5)
WBC: 9.2 10*3/uL (ref 4.0–10.5)
nRBC: 0 % (ref 0.0–0.2)

## 2020-07-31 LAB — URINALYSIS, COMPLETE (UACMP) WITH MICROSCOPIC
Bacteria, UA: NONE SEEN
Bilirubin Urine: NEGATIVE
Glucose, UA: >=500 mg/dL — AB
Ketones, ur: NEGATIVE mg/dL
Leukocytes,Ua: NEGATIVE
Nitrite: NEGATIVE
Protein, ur: NEGATIVE mg/dL
Specific Gravity, Urine: 1.024 (ref 1.005–1.030)
Squamous Epithelial / HPF: NONE SEEN (ref 0–5)
pH: 5 (ref 5.0–8.0)

## 2020-07-31 LAB — BLOOD GAS, VENOUS
Acid-base deficit: 4.2 mmol/L — ABNORMAL HIGH (ref 0.0–2.0)
Bicarbonate: 23 mmol/L (ref 20.0–28.0)
O2 Saturation: 66.4 %
Patient temperature: 37
pCO2, Ven: 50 mmHg (ref 44.0–60.0)
pH, Ven: 7.27 (ref 7.250–7.430)
pO2, Ven: 40 mmHg (ref 32.0–45.0)

## 2020-07-31 LAB — CBG MONITORING, ED
Glucose-Capillary: 117 mg/dL — ABNORMAL HIGH (ref 70–99)
Glucose-Capillary: 218 mg/dL — ABNORMAL HIGH (ref 70–99)
Glucose-Capillary: 330 mg/dL — ABNORMAL HIGH (ref 70–99)

## 2020-07-31 LAB — TROPONIN I (HIGH SENSITIVITY)
Troponin I (High Sensitivity): 30 ng/L — ABNORMAL HIGH (ref <18)
Troponin I (High Sensitivity): 125 ng/L (ref <18)
Troponin I (High Sensitivity): 363 ng/L (ref <18)
Troponin I (High Sensitivity): 605 ng/L (ref <18)

## 2020-07-31 LAB — COMPREHENSIVE METABOLIC PANEL
ALT: 10 U/L (ref 0–44)
AST: 14 U/L — ABNORMAL LOW (ref 15–41)
Albumin: 2.7 g/dL — ABNORMAL LOW (ref 3.5–5.0)
Alkaline Phosphatase: 50 U/L (ref 38–126)
Anion gap: 8 (ref 5–15)
BUN: 19 mg/dL (ref 8–23)
CO2: 19 mmol/L — ABNORMAL LOW (ref 22–32)
Calcium: 6.8 mg/dL — ABNORMAL LOW (ref 8.9–10.3)
Chloride: 112 mmol/L — ABNORMAL HIGH (ref 98–111)
Creatinine, Ser: 1 mg/dL (ref 0.61–1.24)
GFR, Estimated: >60 mL/min (ref >60)
Glucose, Bld: 136 mg/dL — ABNORMAL HIGH (ref 70–99)
Potassium: 3.4 mmol/L — ABNORMAL LOW (ref 3.5–5.1)
Sodium: 139 mmol/L (ref 135–145)
Total Bilirubin: 1.1 mg/dL (ref 0.3–1.2)
Total Protein: 5.6 g/dL — ABNORMAL LOW (ref 6.5–8.1)

## 2020-07-31 LAB — RESP PANEL BY RT-PCR (FLU A&B, COVID) ARPGX2
Influenza A by PCR: NEGATIVE
Influenza B by PCR: NEGATIVE
SARS Coronavirus 2 by RT PCR: POSITIVE — AB

## 2020-07-31 LAB — LACTATE DEHYDROGENASE: LDH: 114 U/L (ref 98–192)

## 2020-07-31 LAB — HEPATITIS B SURFACE ANTIGEN: Hepatitis B Surface Ag: NONREACTIVE

## 2020-07-31 LAB — PHOSPHORUS: Phosphorus: 2.7 mg/dL (ref 2.5–4.6)

## 2020-07-31 LAB — FIBRINOGEN: Fibrinogen: 457 mg/dL (ref 210–475)

## 2020-07-31 LAB — FERRITIN: Ferritin: 56 ng/mL (ref 24–336)

## 2020-07-31 LAB — LACTIC ACID, PLASMA
Lactic Acid, Venous: 1.3 mmol/L (ref 0.5–1.9)
Lactic Acid, Venous: 1.2 mmol/L (ref 0.5–1.9)

## 2020-07-31 LAB — PROTIME-INR
INR: 1.1 (ref 0.8–1.2)
Prothrombin Time: 13.6 seconds (ref 11.4–15.2)

## 2020-07-31 LAB — TRIGLYCERIDES: Triglycerides: 32 mg/dL (ref <150)

## 2020-07-31 LAB — HEMOGLOBIN A1C
Hgb A1c MFr Bld: 7.7 % — ABNORMAL HIGH (ref 4.8–5.6)
Mean Plasma Glucose: 174.29 mg/dL

## 2020-07-31 LAB — BRAIN NATRIURETIC PEPTIDE: B Natriuretic Peptide: 128.2 pg/mL — ABNORMAL HIGH (ref 0.0–100.0)

## 2020-07-31 LAB — C-REACTIVE PROTEIN: CRP: 0.7 mg/dL (ref <1.0)

## 2020-07-31 LAB — FIBRIN DERIVATIVES D-DIMER (ARMC ONLY): Fibrin derivatives D-dimer (ARMC): 1982.16 ng/mL (FEU) — ABNORMAL HIGH (ref 0.00–499.00)

## 2020-07-31 LAB — APTT: aPTT: 26 seconds (ref 24–36)

## 2020-07-31 LAB — PROCALCITONIN: Procalcitonin: 0.12 ng/mL

## 2020-07-31 LAB — MAGNESIUM: Magnesium: 1.6 mg/dL — ABNORMAL LOW (ref 1.7–2.4)

## 2020-07-31 MED ORDER — POTASSIUM CHLORIDE CRYS ER 20 MEQ PO TBCR
40.0000 meq | EXTENDED_RELEASE_TABLET | Freq: Once | ORAL | Status: AC
  Administered 2020-07-31: 40 meq via ORAL
  Filled 2020-07-31: qty 2

## 2020-07-31 MED ORDER — IPRATROPIUM BROMIDE HFA 17 MCG/ACT IN AERS
2.0000 | INHALATION_SPRAY | RESPIRATORY_TRACT | Status: DC
  Administered 2020-07-31 – 2020-08-03 (×15): 2 via RESPIRATORY_TRACT
  Filled 2020-07-31 (×2): qty 12.9

## 2020-07-31 MED ORDER — LACTATED RINGERS IV BOLUS (SEPSIS)
1000.0000 mL | Freq: Once | INTRAVENOUS | Status: AC
  Administered 2020-07-31: 1000 mL via INTRAVENOUS

## 2020-07-31 MED ORDER — SODIUM CHLORIDE 0.9 % IV SOLN
200.0000 mg | Freq: Once | INTRAVENOUS | Status: AC
  Administered 2020-07-31: 200 mg via INTRAVENOUS
  Filled 2020-07-31: qty 200

## 2020-07-31 MED ORDER — ENOXAPARIN SODIUM 40 MG/0.4ML ~~LOC~~ SOLN: 40.0000 mg | SUBCUTANEOUS | Status: DC

## 2020-07-31 MED ORDER — INSULIN ASPART 100 UNIT/ML ~~LOC~~ SOLN
0.0000 [IU] | Freq: Three times a day (TID) | SUBCUTANEOUS | Status: DC
  Administered 2020-07-31: 3 [IU] via SUBCUTANEOUS
  Administered 2020-08-01: 14:00:00 1 [IU] via SUBCUTANEOUS
  Administered 2020-08-01 (×2): 2 [IU] via SUBCUTANEOUS
  Administered 2020-08-02: 13:00:00 5 [IU] via SUBCUTANEOUS
  Administered 2020-08-02: 2 [IU] via SUBCUTANEOUS
  Administered 2020-08-02 – 2020-08-03 (×2): 3 [IU] via SUBCUTANEOUS
  Administered 2020-08-03: 5 [IU] via SUBCUTANEOUS
  Filled 2020-07-31 (×10): qty 1

## 2020-07-31 MED ORDER — MAGNESIUM SULFATE 2 GM/50ML IV SOLN
2.0000 g | Freq: Once | INTRAVENOUS | Status: AC
  Administered 2020-07-31: 2 g via INTRAVENOUS
  Filled 2020-07-31: qty 50

## 2020-07-31 MED ORDER — ATORVASTATIN CALCIUM 20 MG PO TABS
40.0000 mg | ORAL_TABLET | Freq: Every day | ORAL | Status: DC
  Administered 2020-07-31 – 2020-08-03 (×4): 40 mg via ORAL
  Filled 2020-07-31 (×3): qty 2

## 2020-07-31 MED ORDER — HEPARIN (PORCINE) 25000 UT/250ML-% IV SOLN
1300.0000 [IU]/h | INTRAVENOUS | Status: AC
  Administered 2020-07-31: 900 [IU]/h via INTRAVENOUS
  Administered 2020-08-01: 13:00:00 1150 [IU]/h via INTRAVENOUS
  Administered 2020-08-02: 1300 [IU]/h via INTRAVENOUS
  Filled 2020-07-31 (×3): qty 250

## 2020-07-31 MED ORDER — METHYLPREDNISOLONE SODIUM SUCC 40 MG IJ SOLR
40.0000 mg | Freq: Two times a day (BID) | INTRAMUSCULAR | Status: DC
  Administered 2020-08-01 – 2020-08-03 (×4): 40 mg via INTRAVENOUS
  Filled 2020-07-31 (×4): qty 1

## 2020-07-31 MED ORDER — ASPIRIN 81 MG PO CHEW
324.0000 mg | CHEWABLE_TABLET | Freq: Once | ORAL | Status: AC
  Administered 2020-07-31: 324 mg via ORAL
  Filled 2020-07-31: qty 4

## 2020-07-31 MED ORDER — SODIUM CHLORIDE 0.9 % IV SOLN
500.0000 mg | INTRAVENOUS | Status: DC
  Administered 2020-07-31: 500 mg via INTRAVENOUS
  Filled 2020-07-31: qty 500

## 2020-07-31 MED ORDER — BARICITINIB 2 MG PO TABS
4.0000 mg | ORAL_TABLET | Freq: Every day | ORAL | Status: DC
  Administered 2020-07-31 – 2020-08-02 (×3): 4 mg via ORAL
  Filled 2020-07-31 (×4): qty 2

## 2020-07-31 MED ORDER — IPRATROPIUM-ALBUTEROL 0.5-2.5 (3) MG/3ML IN SOLN
9.0000 mL | Freq: Once | RESPIRATORY_TRACT | Status: AC
  Administered 2020-07-31: 9 mL via RESPIRATORY_TRACT
  Filled 2020-07-31: qty 9

## 2020-07-31 MED ORDER — CALCIUM GLUCONATE-NACL 2-0.675 GM/100ML-% IV SOLN
2.0000 g | Freq: Once | INTRAVENOUS | Status: AC
  Administered 2020-07-31: 2000 mg via INTRAVENOUS
  Filled 2020-07-31: qty 100

## 2020-07-31 MED ORDER — SODIUM CHLORIDE 0.9 % IV SOLN
2.0000 g | Freq: Once | INTRAVENOUS | Status: AC
  Administered 2020-07-31: 2 g via INTRAVENOUS
  Filled 2020-07-31: qty 2

## 2020-07-31 MED ORDER — ONDANSETRON HCL 4 MG/2ML IJ SOLN: 4.0000 mg | Freq: Three times a day (TID) | INTRAMUSCULAR | Status: DC | PRN

## 2020-07-31 MED ORDER — LACTATED RINGERS IV SOLN: INTRAVENOUS | Status: DC

## 2020-07-31 MED ORDER — ALBUTEROL SULFATE HFA 108 (90 BASE) MCG/ACT IN AERS
2.0000 | INHALATION_SPRAY | RESPIRATORY_TRACT | Status: DC | PRN
  Filled 2020-07-31: qty 6.7

## 2020-07-31 MED ORDER — INSULIN ASPART 100 UNIT/ML ~~LOC~~ SOLN
0.0000 [IU] | Freq: Every day | SUBCUTANEOUS | Status: DC
  Administered 2020-07-31: 4 [IU] via SUBCUTANEOUS
  Administered 2020-08-02: 2 [IU] via SUBCUTANEOUS
  Filled 2020-07-31 (×2): qty 1

## 2020-07-31 MED ORDER — ACETAMINOPHEN 500 MG PO TABS
1000.0000 mg | ORAL_TABLET | Freq: Once | ORAL | Status: AC
  Administered 2020-07-31: 1000 mg via ORAL
  Filled 2020-07-31: qty 2

## 2020-07-31 MED ORDER — NOREPINEPHRINE 4 MG/250ML-% IV SOLN: 0.0000 ug/min | INTRAVENOUS | Status: DC

## 2020-07-31 MED ORDER — ASCORBIC ACID 500 MG PO TABS
500.0000 mg | ORAL_TABLET | Freq: Every day | ORAL | Status: DC
  Administered 2020-07-31 – 2020-08-03 (×4): 500 mg via ORAL
  Filled 2020-07-31 (×4): qty 1

## 2020-07-31 MED ORDER — AZITHROMYCIN 500 MG PO TABS
500.0000 mg | ORAL_TABLET | Freq: Every day | ORAL | Status: AC
  Administered 2020-07-31: 500 mg via ORAL
  Filled 2020-07-31: qty 1

## 2020-07-31 MED ORDER — HEPARIN BOLUS VIA INFUSION
4000.0000 [IU] | Freq: Once | INTRAVENOUS | Status: AC
  Administered 2020-07-31: 4000 [IU] via INTRAVENOUS
  Filled 2020-07-31: qty 4000

## 2020-07-31 MED ORDER — METHYLPREDNISOLONE SODIUM SUCC 125 MG IJ SOLR
125.0000 mg | Freq: Once | INTRAMUSCULAR | Status: AC
  Administered 2020-07-31: 125 mg via INTRAVENOUS
  Filled 2020-07-31: qty 2

## 2020-07-31 MED ORDER — AZITHROMYCIN 500 MG PO TABS
250.0000 mg | ORAL_TABLET | Freq: Every day | ORAL | Status: DC
  Administered 2020-08-01 – 2020-08-03 (×3): 250 mg via ORAL
  Filled 2020-07-31 (×3): qty 1

## 2020-07-31 MED ORDER — ZINC SULFATE 220 (50 ZN) MG PO CAPS
220.0000 mg | ORAL_CAPSULE | Freq: Every day | ORAL | Status: DC
  Administered 2020-07-31 – 2020-08-03 (×4): 220 mg via ORAL
  Filled 2020-07-31 (×4): qty 1

## 2020-07-31 MED ORDER — ACETAMINOPHEN 325 MG PO TABS: 650.0000 mg | ORAL_TABLET | Freq: Four times a day (QID) | ORAL | Status: DC | PRN

## 2020-07-31 MED ORDER — SODIUM CHLORIDE 0.9 % IV SOLN: 2.0000 g | INTRAVENOUS | Status: DC

## 2020-07-31 MED ORDER — CALCIUM CARBONATE-VITAMIN D 500-200 MG-UNIT PO TABS
1.0000 | ORAL_TABLET | Freq: Every day | ORAL | Status: DC
  Administered 2020-08-01 – 2020-08-03 (×3): 1 via ORAL
  Filled 2020-07-31 (×4): qty 1

## 2020-07-31 MED ORDER — DM-GUAIFENESIN ER 30-600 MG PO TB12: 1.0000 | ORAL_TABLET | Freq: Two times a day (BID) | ORAL | Status: DC | PRN

## 2020-07-31 MED ORDER — SODIUM CHLORIDE 0.9 % IV SOLN
100.0000 mg | Freq: Every day | INTRAVENOUS | Status: DC
  Administered 2020-08-01 – 2020-08-03 (×3): 100 mg via INTRAVENOUS
  Filled 2020-07-31 (×3): qty 20

## 2020-07-31 NOTE — ED Notes (Signed)
Spoke with wife at this time. Given update on patient at this time. Attempted to educate wife on reasoning for no visitors at this time due to his dx.

## 2020-07-31 NOTE — ED Notes (Addendum)
Pt up out of bed attempting to use urinal. Non skid socks  placed on feet previously. Call bell provided to pt who states he has not had one today until now, however is noted on siderail next to pt. siderail on right side elevated by this RN for safety. Po fluids provided previously. Pt has consumed approx 80% of meal tray. Urinal emptied of of urine. Pt has spilled urine/water on floor, floor cleansed. Trash removed from room, room straighted for pt. Pt instructed to please use call bell before getting up and to wait for staff before standing. Pt verbalizes understanding. atrovent at beside on bedside table.

## 2020-07-31 NOTE — ED Provider Notes (Signed)
[facility] Emergency Department Provider Note  ____________________________________________   First MD Initiated Contact with Patient 07/31/20 0827     (approximate)  I have reviewed the triage vital signs and the nursing notes.   HISTORY  Chief Complaint Shortness of Breath   HPI George Hensley is a 86 y.o. male with past medical history of asthma, COPD, bronchiectasis and DM who presents via EMS from home for assessment of acute onset of shortness of breath that began sometime overnight last night.  Patient denies any chest pain or change in his cough from baseline.  States he has previously been on oxygen but has not worn any in the last year and a half.  Per EMS patient had a room air sat of 70% which improved to mid 90s on nonrebreather.  He does endorse a little bit of nausea but denies any vomiting or diarrhea.  Denies any headache, earache, sore throat, abdominal pain, back pain, urinary symptoms, rash or extremity pain.          Past Medical History:  Diagnosis Date  . Asthma   . Bronchiectasis (HCC)   . COPD (chronic obstructive pulmonary disease) (HCC)   . Diabetes mellitus     Patient Active Problem List   Diagnosis Date Noted  . Pneumonia due to COVID-19 virus 07/31/2020  . COPD exacerbation (HCC) 07/31/2020  . Diabetes mellitus without complication (HCC) 07/31/2020  . Sepsis (HCC) 07/31/2020  . Hypokalemia 07/31/2020  . Hypomagnesemia 07/31/2020  . Acute on chronic respiratory failure with hypoxia and hypercapnia (HCC) 05/29/2018  . CAP (community acquired pneumonia) 10/12/2017  . Dyspnea 06/26/2016  . Asthma exacerbation 06/26/2016  . Hypoxia 06/12/2016  . Acute on chronic respiratory failure with hypoxia (HCC) 08/02/2015  . Asthma 04/04/2011  . Pulmonary nodule 03/06/2011    Past Surgical History:  Procedure Laterality Date  . CHOLECYSTECTOMY  2000  . CORONARY STENT PLACEMENT  1999    Prior to Admission medications    Medication Sig Start Date End Date Taking? Authorizing Provider  glimepiride (AMARYL) 4 MG tablet Take 4 mg by mouth 2 (two) times daily.   Yes [provider]  aspirin EC 81 MG tablet Take 81 mg by mouth daily.  09/22/12   [provider]  Cholecalciferol (VITAMIN D-1000 MAX ST) 1000 units tablet Take 1 tablet by mouth 3 (three) times a week.    [provider]  diltiazem (CARDIZEM SR) 120 MG 12 hr capsule Take 120 mg by mouth daily.    [provider]  empagliflozin (JARDIANCE) 25 MG TABS tablet Take 25 mg by mouth daily.    [provider]  finasteride (PROSCAR) 5 MG tablet Take 5 mg by mouth daily.    [provider]  ketorolac (ACULAR) 0.5 % ophthalmic solution Place 1 drop into the left eye 3 (three) times daily. 10/05/17   [provider]  metFORMIN (GLUCOPHAGE) 1000 MG tablet Take 1,000 mg by mouth 2 (two) times daily.    [provider]  ofloxacin (OCUFLOX) 0.3 % ophthalmic solution Place 1 drop into the left eye 3 (three) times daily. 10/05/17   [provider]  prednisoLONE acetate (PRED FORTE) 1 % ophthalmic suspension Place 1 drop into the left eye 3 (three) times daily. 10/05/17   [provider]  sitaGLIPtin (JANUVIA) 100 MG tablet Take 1 tablet by mouth daily.    [provider]  tiotropium (SPIRIVA) 18 MCG inhalation capsule Place 18 mcg into inhaler and  inhale daily.    [provider]  VENTOLIN HFA 108 (90 Base) MCG/ACT inhaler Inhale 1 puff into the lungs every 6 (six) hours as needed. 06/25/16   [provider]    Allergies Metoprolol  Family History  Problem Relation Age of Onset  . Asthma Brother     Social History Social History   Tobacco Use  . Smoking status: Never Smoker  . Smokeless tobacco: Current User    Types: Chew  Vaping Use  . Vaping Use: Never used  Substance Use Topics  . Alcohol use: Yes    Comment: socially last drink 2 weeks ago  .  Drug use: No    Review of Systems  Review of Systems  Constitutional: Positive for fever and malaise/fatigue. Negative for chills.  HENT: Negative for sore throat.   Eyes: Negative for pain.  Respiratory: Positive for cough ( chronic) and shortness of breath. Negative for stridor.   Cardiovascular: Negative for chest pain.  Gastrointestinal: Positive for nausea. Negative for vomiting.  Genitourinary: Negative for dysuria.  Musculoskeletal: Negative for myalgias.  Skin: Negative for rash.  Neurological: Negative for seizures, loss of consciousness and headaches.  Psychiatric/Behavioral: Negative for suicidal ideas.  All other systems reviewed and are negative.     ____________________________________________   PHYSICAL EXAM:  VITAL SIGNS: ED Triage Vitals [07/31/20 0827]  Enc Vitals Group     BP 133/72     Pulse Rate (!) 115     Resp (!) 37     Temp (!) 104 F (40 C)     Temp Source Oral     SpO2 93 %     Weight      Height      Head Circumference      Peak Flow      Pain Score      Pain Loc      Pain Edu?      Excl. in GC?    Vitals:   07/31/20 0953 07/31/20 1000  BP:  (!) 113/51  Pulse:  (!) 102  Resp:  (!) 21  Temp: 97.9 F (36.6 C)   SpO2:  94%   Physical Exam Vitals and nursing note reviewed.  Constitutional:      General: He is in acute distress.     Appearance: He is well-developed. He is ill-appearing.  HENT:     Head: Normocephalic and atraumatic.     Right Ear: External ear normal.     Left Ear: External ear normal.     Nose: Nose normal.     Mouth/Throat:     Mouth: Mucous membranes are dry.  Eyes:     Conjunctiva/sclera: Conjunctivae normal.  Cardiovascular:     Rate and Rhythm: Regular rhythm. Tachycardia present.     Heart sounds: No murmur heard.   Pulmonary:     Effort: Tachypnea and respiratory distress present.     Breath sounds: Wheezing and rhonchi present.  Abdominal:     Palpations: Abdomen is soft.     Tenderness:  There is no abdominal tenderness.  Musculoskeletal:     Cervical back: Neck supple.     Right lower leg: No edema.     Left lower leg: No edema.  Skin:    General: Skin is warm and dry.     Capillary Refill: Capillary refill takes 2 to 3 seconds.  Neurological:     Mental Status: He is alert and oriented to person, place, and time.  Psychiatric:  Mood and Affect: Mood normal.      ____________________________________________   LABS (all labs ordered are listed, but only abnormal results are displayed)  Labs Reviewed  RESP PANEL BY RT-PCR (FLU A&B, COVID) ARPGX2 - Abnormal; Notable for the following components:      Result Value   SARS Coronavirus 2 by RT PCR POSITIVE (*)    All other components within normal limits  COMPREHENSIVE METABOLIC PANEL - Abnormal; Notable for the following components:   Potassium 3.4 (*)    Chloride 112 (*)    CO2 19 (*)    Glucose, Bld 136 (*)    Calcium 6.8 (*)    Total Protein 5.6 (*)    Albumin 2.7 (*)    AST 14 (*)    All other components within normal limits  CBC WITH DIFFERENTIAL/PLATELET - Abnormal; Notable for the following components:   Neutro Abs 8.2 (*)    Lymphs Abs 0.5 (*)    All other components within normal limits  URINALYSIS, COMPLETE (UACMP) WITH MICROSCOPIC - Abnormal; Notable for the following components:   Color, Urine YELLOW (*)    APPearance CLEAR (*)    Glucose, UA >=500 (*)    Hgb urine dipstick SMALL (*)    All other components within normal limits  MAGNESIUM - Abnormal; Notable for the following components:   Magnesium 1.6 (*)    All other components within normal limits  BRAIN NATRIURETIC PEPTIDE - Abnormal; Notable for the following components:   B Natriuretic Peptide 128.2 (*)    All other components within normal limits  TROPONIN I (HIGH SENSITIVITY) - Abnormal; Notable for the following components:   Troponin I (High Sensitivity) 30 (*)    All other components within normal limits  CULTURE, BLOOD  (ROUTINE X 2)  CULTURE, BLOOD (ROUTINE X 2)  URINE CULTURE  LACTIC ACID, PLASMA  PROTIME-INR  APTT  PROCALCITONIN  LACTIC ACID, PLASMA  BLOOD GAS, VENOUS  HEMOGLOBIN A1C  PHOSPHORUS  TROPONIN I (HIGH SENSITIVITY)   ____________________________________________  EKG  Sinus tachycardia with a ventricular rate of 119, left bundle branch block he  ____________________________________________  RADIOLOGY  ED MD interpretation: Multifocal pneumonia without overt edema, large effusion, pneumothorax, or other clear acute intrathoracic process  Official radiology report(s): DG Chest Port 1 View  Result Date: 07/31/2020 CLINICAL DATA:  Short of breath.  Questionable sepsis.  COPD. EXAM: PORTABLE CHEST 1 VIEW COMPARISON:  08/08/2018. FINDINGS: Lungs are hyperexpanded. The cardiopericardial silhouette is within normal limits for size. Interstitial markings are diffusely coarsened with chronic features. Patchy airspace disease is seen in the left lung, most prominently in the left upper lobe where it has a somewhat nodular/masslike character. The visualized bony structures of the thorax show no acute abnormality. Telemetry leads overlie the chest. IMPRESSION: Emphysema with patchy airspace disease in the left lung, most prominently in the left upper lobe. Features suggest pneumonia. Given the relatively focal appearance in the left upper lung, follow-up imaging recommended to ensure resolution. Electronically Signed   By: Kennith CenterEric  Mansell M.D.   On: 07/31/2020 09:35    ____________________________________________   PROCEDURES  Procedure(s) performed (including Critical Care):  .Critical Care Performed by: Gilles ChiquitoSmith, Jamiah Homeyer P, MD Authorized by: Gilles ChiquitoSmith, Anivea Velasques P, MD   Critical care provider statement:    Critical care time (minutes):  45   Critical care time was exclusive of:  Separately billable procedures and treating other patients   Critical care was necessary to treat or prevent imminent or  life-threatening  deterioration of the following conditions:  Respiratory failure, sepsis and dehydration   Critical care was time spent personally by me on the following activities:  Discussions with consultants, evaluation of patient's response to treatment, examination of patient, ordering and performing treatments and interventions, ordering and review of laboratory studies, ordering and review of radiographic studies, pulse oximetry, re-evaluation of patient's condition, obtaining history from patient or surrogate and review of old charts     ____________________________________________   INITIAL IMPRESSION / ASSESSMENT AND PLAN / ED COURSE      Patient presents above to history exam for assessment of acute onset of shortness of breath over the last 12 hours.  Per EMS patient was initially hypoxic and arrived on nonrebreather.  On arrival patient is noted to be normotensive, febrile with a temp of 104, tachypneic with RR 37, tachycardic with a heart rate of 115 and requiring 5 L nasal cannula to maintain sats in the low 90s.  Given fever tachycardia and concern for infectious etiology of symptoms code sepsis was immediately initiated.  No history of trauma or toxic ingestion.  There is some wheezing and rhonchi on exam and given history of COPD and asthma patient was given duo nebs and Solu-Medrol as well as antibiotics and IV fluids.  Patient is noted to be Covid positive.  Remdesivir was ordered.  CMP remarkable for K of 3.4, bicarb of 19, and otherwise no significant derangements.  Initial lactic acid is 1.3.  CBC is unremarkable.  Urine has hemoglobin and blood but no evidence of infection.  Troponin is elevated at 30 I suspect this represents demand ischemia and not an acute coronary occlusion.  BNP is just above reference range at 128 and a very low suspicion for significant heart failure volume overload contributing to presentation at this time.  Patient will be admitted to hospital  service for further evaluation management  ____________________________________________   FINAL CLINICAL IMPRESSION(S) / ED DIAGNOSES  Final diagnoses:  Acute on chronic respiratory failure with hypoxia (HCC)  COVID-19  Troponin I above reference range  Hypomagnesemia  Hypokalemia    Medications  magnesium sulfate IVPB 2 g 50 mL (2 g Intravenous New Bag/Given 07/31/20 0955)  potassium chloride SA (KLOR-CON) CR tablet 40 mEq (has no administration in time range)  aspirin chewable tablet 324 mg (has no administration in time range)  remdesivir 200 mg in sodium chloride 0.9% 250 mL IVPB (has no administration in time range)    Followed by  remdesivir 100 mg in sodium chloride 0.9 % 100 mL IVPB (has no administration in time range)  ipratropium (ATROVENT HFA) inhaler 2 puff (has no administration in time range)  albuterol (VENTOLIN HFA) 108 (90 Base) MCG/ACT inhaler 2 puff (has no administration in time range)  dextromethorphan-guaiFENesin (MUCINEX DM) 30-600 MG per 12 hr tablet 1 tablet (has no administration in time range)  ondansetron (ZOFRAN) injection 4 mg (has no administration in time range)  acetaminophen (TYLENOL) tablet 650 mg (has no administration in time range)  insulin aspart (novoLOG) injection 0-9 Units (has no administration in time range)  insulin aspart (novoLOG) injection 0-5 Units (has no administration in time range)  methylPREDNISolone sodium succinate (SOLU-MEDROL) 40 mg/mL injection 40 mg (has no administration in time range)  ascorbic acid (VITAMIN C) tablet 500 mg (has no administration in time range)  zinc sulfate capsule 220 mg (has no administration in time range)  calcium gluconate 2 g/ 100 mL sodium chloride IVPB (has no administration in time range)  azithromycin (ZITHROMAX) tablet 500 mg (has no administration in time range)    Followed by  azithromycin (ZITHROMAX) tablet 250 mg (has no administration in time range)  lactated ringers bolus 1,000 mL (0  mLs Intravenous Stopped 07/31/20 0911)    And  lactated ringers bolus 1,000 mL (0 mLs Intravenous Stopped 07/31/20 0936)  acetaminophen (TYLENOL) tablet 1,000 mg (1,000 mg Oral Given 07/31/20 0842)  ceFEPIme (MAXIPIME) 2 g in sodium chloride 0.9 % 100 mL IVPB (0 g Intravenous Stopped 07/31/20 0947)  methylPREDNISolone sodium succinate (SOLU-MEDROL) 125 mg/2 mL injection 125 mg (125 mg Intravenous Given 07/31/20 0907)  ipratropium-albuterol (DUONEB) 0.5-2.5 (3) MG/3ML nebulizer solution 9 mL (9 mLs Nebulization Given 07/31/20 0908)     ED Discharge Orders    None       Note:  This document was prepared using Dragon voice recognition software and may include unintentional dictation errors.   Gilles Chiquito, MD 07/31/20 1036

## 2020-07-31 NOTE — ED Notes (Signed)
Date and time results received: 07/31/20 11:27 AM (use smartphrase ".now" to insert current time)  Test: troponin Critical Value: 125  Name of Provider Notified: Clyde Lundborg, MD   Orders Received? Or Actions Taken?: Actions Taken: Provider aware

## 2020-07-31 NOTE — Consult Note (Signed)
PHARMACY -  BRIEF ANTIBIOTIC NOTE   Pharmacy has received consult(s) for Cefepime from an ED provider.  The patient's profile has been reviewed for ht/wt/allergies/indication/available labs.    One time order(s) placed for Cefepime 2g IV x 1  Further antibiotics/pharmacy consults should be ordered by admitting physician if indicated.                       Thank you,  Albina Billet, PharmD, BCPS Clinical Pharmacist 07/31/2020 9:07 AM

## 2020-07-31 NOTE — ED Notes (Signed)
Date and time results received: 07/31/20 10:08 AM   Test: Covid Critical Value: positive  Name of Provider Notified: Dr. Michiel Sites

## 2020-07-31 NOTE — Consult Note (Signed)
CODE SEPSIS - PHARMACY COMMUNICATION  **Broad Spectrum Antibiotics should be administered within 1 hour of Sepsis diagnosis**  Time Code Sepsis Called/Page Received: 0830  Antibiotics Ordered: Cefepime and Azithromycin  Time of 1st antibiotic administration: 846  Additional action taken by pharmacy: none  If necessary, Name of Provider/Nurse Contacted: na    Albina Billet ,PharmD Clinical Pharmacist  07/31/2020  8:30 AM

## 2020-07-31 NOTE — Consult Note (Signed)
ANTICOAGULATION CONSULT NOTE - Initial Consult  Pharmacy Consult for Heparin Drip Indication: chest pain/ACS/STEMI  Allergies  Allergen Reactions  . Metoprolol     Pt states syncope with lopressor    Patient Measurements: Height: 5\' 9"  (175.3 cm) Weight: 72.6 kg (160 lb) IBW/kg (Calculated) : 70.7 Heparin Dosing Weight: 72.6kg  Vital Signs: Temp: 97.9 F (36.6 C) (12/05 0953) Temp Source: Oral (12/05 0953) BP: 106/70 (12/05 1430) Pulse Rate: 82 (12/05 1430)  Labs: Recent Labs    07/31/20 0832 07/31/20 1040 07/31/20 1340  HGB 14.0  --   --   HCT 43.1  --   --   PLT 255  --   --   APTT 26  --   --   LABPROT 13.6  --   --   INR 1.1  --   --   CREATININE 1.00  --   --   TROPONINIHS 30* 125* 363*    Estimated Creatinine Clearance: 52 mL/min (by C-G formula based on SCr of 1 mg/dL).   Medical History: Past Medical History:  Diagnosis Date  . Asthma   . Bronchiectasis (HCC)   . COPD (chronic obstructive pulmonary disease) (HCC)   . Diabetes mellitus     Medications:  No PTA anticoagulant of record  Assessment: 86 y.o. male with medical history significant of COPD, diabetes mellitus, asthma, bronchiectasis, presenting with shortness breath, fever, COVID, and troponins trending up.  Pharmacy has been consulted to initiate and monitor a heparin drip.  Goal of Therapy:  Heparin level 0.3-0.7 units/ml Monitor platelets by anticoagulation protocol: Yes   Plan:  Give 4000 units bolus x 1 Start heparin infusion at 900 units/hr Check anti-Xa level in 8 hours and daily while on heparin Continue to monitor H&H and platelets  97, PharmD, BCPS Clinical Pharmacist 07/31/2020 3:22 PM

## 2020-07-31 NOTE — ED Notes (Signed)
Pt wearing clothing when transferred to room 38.

## 2020-07-31 NOTE — ED Notes (Signed)
Pt provided with diet tray at this time. Tray set up for pt per request.

## 2020-07-31 NOTE — ED Notes (Signed)
Attempted to contact wife, line is busy at this time.

## 2020-07-31 NOTE — ED Notes (Addendum)
Pt provided with diet tray at this time. Pt asleep at this time.

## 2020-07-31 NOTE — ED Notes (Signed)
Pt provided with diet tray at this time. Set up for pt. Updated patient that wife had called.

## 2020-07-31 NOTE — ED Notes (Signed)
Pt's sats noted to be 74%; in room to check on pt, had O2 taken off to eat. Replaced O2 and sats returned to 92%. Educated pt regarding keeping O2 on at all times due to need.

## 2020-07-31 NOTE — ED Triage Notes (Signed)
Pt to ED via [facility] from home for shortness of breath. Per EMS pt is chronically on 2 liters of oxygen. Pt woke up with shortness of breath, EMs reports pts SpO2 70% on 2 liters, temp 103.1 and pt tachy in the 120's. Pt was placed on 15 liters via NR[facility] and sats improved to 98%.  Pt is able to speak in complete sentences at his time. Pt is in NAD.

## 2020-07-31 NOTE — ED Notes (Signed)
Report given to April, RN

## 2020-07-31 NOTE — ED Notes (Signed)
Report to candice.

## 2020-07-31 NOTE — Consult Note (Signed)
Remdesivir - Pharmacy Brief Note   O:  ALT: 10 CXR: Emphysema with patchy airspace disease in the left lung, most prominently in the left upper lobe. Features suggest pneumonia SpO2: 94% on 5L    A/P:  Remdesivir 200 mg IVPB once followed by 100 mg IVPB daily x 4 days.   Raiford Noble, PharmD Pharmacy Resident  07/31/2020 10:09 AM

## 2020-07-31 NOTE — ED Notes (Signed)
Patient denies pain and is resting comfortably.  

## 2020-07-31 NOTE — Consult Note (Signed)
CARDIOLOGY CONSULT NOTE               Patient ID: DADEN MAHANY MRN: [id number] DOB/AGE: Jan 05, 1931 86 y.o.  Admit date: 07/31/2020 Referring Physician .Dr Clyde Lundborg hospitalist Primary Physician Dr. Lianne Moris Primary Cardiologist Gwen Pounds Reason for Consultation shortness of breath elevated troponins  HPI: Patient is a 86 year old male multiple medical problems coronary disease COPD with hypoxemia on home O2 diabetes asthma bronchiectasis low-grade fever found to have Covid pneumonia patient had already been vaccinated but developed dyspnea shortness of breath to brown to emergency room do not be Covid positive troponins have been elevated on admission.  Denies any significant chest pain just feels weak and tired and short of breath  Review of systems complete and found to be negative unless listed above     Past Medical History:  Diagnosis Date  . Asthma   . Bronchiectasis (HCC)   . COPD (chronic obstructive pulmonary disease) (HCC)   . Diabetes mellitus     Past Surgical History:  Procedure Laterality Date  . CHOLECYSTECTOMY  2000  . CORONARY STENT PLACEMENT  1999    (Not in a hospital admission)  Social History   Socioeconomic History  . Marital status: Married    Spouse name: Not on file  . Number of children: 1  . Years of education: Not on file  . Highest education level: Not on file  Occupational History  . Occupation: Retired    Comment: Surveyor, minerals  Tobacco Use  . Smoking status: Never Smoker  . Smokeless tobacco: Current User    Types: Chew  Vaping Use  . Vaping Use: Never used  Substance and Sexual Activity  . Alcohol use: Yes    Comment: socially last drink 2 weeks ago  . Drug use: No  . Sexual activity: Yes  Other Topics Concern  . Not on file  Social History Narrative  . Not on file   Social Determinants of Health   Financial Resource Strain:   . Difficulty of Paying Living Expenses: Not on file  Food Insecurity:   . Worried About  Programme researcher, broadcasting/film/video in the Last Year: Not on file  . Ran Out of Food in the Last Year: Not on file  Transportation Needs:   . Lack of Transportation (Medical): Not on file  . Lack of Transportation (Non-Medical): Not on file  Physical Activity:   . Days of Exercise per Week: Not on file  . Minutes of Exercise per Session: Not on file  Stress:   . Feeling of Stress : Not on file  Social Connections:   . Frequency of Communication with Friends and Family: Not on file  . Frequency of Social Gatherings with Friends and Family: Not on file  . Attends Religious Services: Not on file  . Active Member of Clubs or Organizations: Not on file  . Attends Banker Meetings: Not on file  . Marital Status: Not on file  Intimate Partner Violence:   . Fear of Current or Ex-Partner: Not on file  . Emotionally Abused: Not on file  . Physically Abused: Not on file  . Sexually Abused: Not on file    Family History  Problem Relation Age of Onset  . Asthma Brother       Review of systems complete and found to be negative unless listed above      PHYSICAL EXAM  General: Well developed, well nourished, in no acute distress HEENT:  Normocephalic and atramatic  Neck:  No JVD.  Lungs: Rhonchi bilaterally to auscultation and percussion. Heart: HRRR . Normal S1 and S2 without gallops or murmurs.  Abdomen: Bowel sounds are positive, abdomen soft and non-tender  Msk:  Back normal, normal gait. Normal strength and tone for age. Extremities: No clubbing, cyanosis or edema.   Neuro: Alert and oriented X 3. Psych:  Good affect, responds appropriately  Labs:   Lab Results  Component Value Date   WBC 9.2 07/31/2020   HGB 14.0 07/31/2020   HCT 43.1 07/31/2020   MCV 94.3 07/31/2020   PLT 255 07/31/2020    Recent Labs  Lab 07/31/20 0832  NA 139  K 3.4*  CL 112*  CO2 19*  BUN 19  CREATININE 1.00  CALCIUM 6.8*  PROT 5.6*  BILITOT 1.1  ALKPHOS 50  ALT 10  AST 14*  GLUCOSE 136*    Lab Results  Component Value Date   CKTOTAL 146 09/21/2013   CKMB 1.7 09/21/2013   TROPONINI <0.03 08/08/2018    Lab Results  Component Value Date   CHOL  06/14/2008    141        ATP III CLASSIFICATION:  <200     mg/dL   Desirable  841-324  mg/dL   Borderline High  >=401    mg/dL   High   Lab Results  Component Value Date   HDL 70 06/14/2008   Lab Results  Component Value Date   LDLCALC  06/14/2008    60        Total Cholesterol/HDL:CHD Risk Coronary Heart Disease Risk Table                     Men   Women  1/2 Average Risk   3.4   3.3   Lab Results  Component Value Date   TRIG 32 07/31/2020   TRIG 57 06/14/2008   Lab Results  Component Value Date   CHOLHDL 2.0 06/14/2008   No results found for: LDLDIRECT    Radiology: [facility] Chest Port 1 View  Result Date: 07/31/2020 CLINICAL DATA:  Short of breath.  Questionable sepsis.  COPD. EXAM: PORTABLE CHEST 1 VIEW COMPARISON:  08/08/2018. FINDINGS: Lungs are hyperexpanded. The cardiopericardial silhouette is within normal limits for size. Interstitial markings are diffusely coarsened with chronic features. Patchy airspace disease is seen in the left lung, most prominently in the left upper lobe where it has a somewhat nodular/masslike character. The visualized bony structures of the thorax show no acute abnormality. Telemetry leads overlie the chest. IMPRESSION: Emphysema with patchy airspace disease in the left lung, most prominently in the left upper lobe. Features suggest pneumonia. Given the relatively focal appearance in the left upper lung, follow-up imaging recommended to ensure resolution. Electronically Signed   By: Kennith Center M.D.   On: 07/31/2020 09:35    EKG: Sinus tachycardia interventricular conduction delay  ASSESSMENT AND PLAN:  Elevated troponins Shortness of breath Covid 19 Bronchiectasis COPD Diabetes Possible myocarditis versus non-STEMI  Plan Recommend supplemental oxygen therapy for  Covid Consider steroid therapy for Covid pneumonia Anticoagulation with heparin for elevated troponins Echocardiogram for further assessment evaluation Inhalers and respiratory therapy for bronchitis bronchiectasis COPD Supportive medical care for now Unlikely to pursue invasive strategy Signed: Alwyn Pea MD 07/31/2020, 2:38 PM

## 2020-07-31 NOTE — H&P (Addendum)
History and Physical    George Hensley ZOX:[id number] DOB: 02-11-1931 DOA: 07/31/2020  Referring MD/NP/PA:   PCP: Danella Penton, MD   Patient coming from:  The patient is coming from home.  At baseline, pt is independent for most of ADL.        Chief Complaint: SOB, fever  HPI: George Hensley is a 86 y.o. male with medical history significant of COPD (on 2 L oxygen sometimes), diabetes mellitus, asthma, bronchiectasis, who presents with shortness breath and fever.  Patient states that he started having shortness breath since last night, it has been progressively worsening. Patient also has cough with yellow-colored mucus production per his wife.  No chest pain.  Patient has chills and fever with body temperature 103.1 at home. His temperature was 104 initially ED. He has nausea, no vomiting, diarrhea or abdominal pain.  No symptoms of UTI or unilateral weakness.  ED Course: pt was found to have positive Covid PCR, troponin level 30, 125, BNP 128, negative urinalysis, lactic acid 1.3, INR 1.1, potassium 3.4, calcium 6.8 which is corrected to 7.8, magnesium 1.6, renal function okay, blood pressure 96/54, tachycardia with heart rate of 116, RR 37, oxygen saturation 88% on room air, which improved to 94% on 5 L oxygen.  Chest x-ray showed emphysema and patchy infiltration in left lower lobe.  Patient is admitted to MedSurg bed as inpatient. Dr. Juliann Pares of card is consulted.  Review of Systems:   General: has fevers, chills, no body weight gain, has fatigue HEENT: no blurry vision, hearing changes or sore throat Respiratory: has dyspnea, coughing, wheezing CV: no chest pain, no palpitations GI: has nausea, no vomiting, abdominal pain, diarrhea, constipation GU: no dysuria, burning on urination, increased urinary frequency, hematuria  Ext: no leg edema Neuro: no unilateral weakness, numbness, or tingling, no vision change or hearing loss Skin: no rash, no skin tear. MSK: No muscle spasm,  no deformity, no limitation of range of movement in spin Heme: No easy bruising.  Travel history: No recent long distant travel.  Allergy:  Allergies  Allergen Reactions  . Metoprolol     Pt states syncope with lopressor    Past Medical History:  Diagnosis Date  . Asthma   . Bronchiectasis (HCC)   . COPD (chronic obstructive pulmonary disease) (HCC)   . Diabetes mellitus     Past Surgical History:  Procedure Laterality Date  . CHOLECYSTECTOMY  2000  . CORONARY STENT PLACEMENT  1999    Social History:  reports that he has never smoked. His smokeless tobacco use includes chew. He reports current alcohol use. He reports that he does not use drugs.  Family History:  Family History  Problem Relation Age of Onset  . Asthma Brother      Prior to Admission medications   Medication Sig Start Date End Date Taking? Authorizing Provider  glimepiride (AMARYL) 4 MG tablet Take 4 mg by mouth 2 (two) times daily.   Yes [provider]  aspirin EC 81 MG tablet Take 81 mg by mouth daily.  09/22/12   [provider]  Cholecalciferol (VITAMIN D-1000 MAX ST) 1000 units tablet Take 1 tablet by mouth 3 (three) times a week.    [provider]  diltiazem (CARDIZEM SR) 120 MG 12 hr capsule Take 120 mg by mouth daily.    [provider]  empagliflozin (JARDIANCE) 25 MG TABS tablet Take 25 mg by mouth daily.    [provider]  finasteride (PROSCAR)  5 MG tablet Take 5 mg by mouth daily.    [provider]  ketorolac (ACULAR) 0.5 % ophthalmic solution Place 1 drop into the left eye 3 (three) times daily. 10/05/17   [provider]  metFORMIN (GLUCOPHAGE) 1000 MG tablet Take 1,000 mg by mouth 2 (two) times daily.    [provider]  ofloxacin (OCUFLOX) 0.3 % ophthalmic solution Place 1 drop into the left eye 3 (three) times daily. 10/05/17   [provider]  prednisoLONE acetate (PRED FORTE) 1 % ophthalmic suspension Place 1  drop into the left eye 3 (three) times daily. 10/05/17   [provider]  sitaGLIPtin (JANUVIA) 100 MG tablet Take 1 tablet by mouth daily.    [provider]  tiotropium (SPIRIVA) 18 MCG inhalation capsule Place 18 mcg into inhaler and inhale daily.    [provider]  VENTOLIN HFA 108 (90 Base) MCG/ACT inhaler Inhale 1 puff into the lungs every 6 (six) hours as needed. 06/25/16   [provider]    Physical Exam: Vitals:   07/31/20 1300 07/31/20 1330 07/31/20 1400 07/31/20 1430  BP: (!) 100/59 99/60 114/67 106/70  Pulse:  80 94 82  Resp: 20 (!) 21 (!) 26 (!) 28  Temp:      TempSrc:      SpO2:  99% 92% 100%  Weight:      Height:       General: Not in acute distress HEENT:       Eyes: PERRL, EOMI, no scleral icterus.       ENT: No discharge from the ears and nose, no pharynx injection, no tonsillar enlargement.        Neck: No JVD, no bruit, no mass felt. Heme: No neck lymph node enlargement. Cardiac: S1/S2, RRR, No murmurs, No gallops or rubs. Respiratory: Has wheezing rhonchi bilaterally GI: Soft, nondistended, nontender, no rebound pain, no organomegaly, BS present. GU: No hematuria Ext: No pitting leg edema bilaterally. 1+DP/PT pulse bilaterally. Musculoskeletal: No joint deformities, No joint redness or warmth, no limitation of ROM in spin. Skin: No rashes.  Neuro: Alert, oriented X3, cranial nerves II-XII grossly intact, moves all extremities normally.  Psych: Patient is not psychotic, no suicidal or hemocidal ideation.  Labs on Admission: I have personally reviewed following labs and imaging studies  CBC: Recent Labs  Lab 07/31/20 0832  WBC 9.2  NEUTROABS 8.2*  HGB 14.0  HCT 43.1  MCV 94.3  PLT 255   Basic Metabolic Panel: Recent Labs  Lab 07/31/20 0832  NA 139  K 3.4*  CL 112*  CO2 19*  GLUCOSE 136*  BUN 19  CREATININE 1.00  CALCIUM 6.8*  MG 1.6*  PHOS 2.7   GFR: Estimated Creatinine Clearance: 52 mL/min (by C-G  formula based on SCr of 1 mg/dL). Liver Function Tests: Recent Labs  Lab 07/31/20 0832  AST 14*  ALT 10  ALKPHOS 50  BILITOT 1.1  PROT 5.6*  ALBUMIN 2.7*   No results for input(s): LIPASE, AMYLASE in the last 168 hours. No results for input(s): AMMONIA in the last 168 hours. Coagulation Profile: Recent Labs  Lab 07/31/20 0832  INR 1.1   Cardiac Enzymes: No results for input(s): CKTOTAL, CKMB, CKMBINDEX, TROPONINI in the last 168 hours. BNP (last 3 results) No results for input(s): PROBNP in the last 8760 hours. HbA1C: Recent Labs    07/31/20 1040  HGBA1C 7.7*   CBG: Recent Labs  Lab 07/31/20 1341  GLUCAP 117*  Lipid Profile: Recent Labs    07/31/20 1040  TRIG 32   Thyroid Function Tests: No results for input(s): TSH, T4TOTAL, FREET4, T3FREE, THYROIDAB in the last 72 hours. Anemia Panel: Recent Labs    07/31/20 0832  FERRITIN 56   Urine analysis:    Component Value Date/Time   COLORURINE YELLOW (A) 07/31/2020 0832   APPEARANCEUR CLEAR (A) 07/31/2020 0832   APPEARANCEUR Cloudy 05/16/2014 0851   LABSPEC 1.024 07/31/2020 0832   LABSPEC 1.020 05/16/2014 0851   PHURINE 5.0 07/31/2020 0832   GLUCOSEU >=500 (A) 07/31/2020 0832   GLUCOSEU 150 mg/dL 62/13/0865 7846   HGBUR SMALL (A) 07/31/2020 0832   BILIRUBINUR NEGATIVE 07/31/2020 0832   BILIRUBINUR Negative 05/16/2014 0851   KETONESUR NEGATIVE 07/31/2020 0832   PROTEINUR NEGATIVE 07/31/2020 0832   NITRITE NEGATIVE 07/31/2020 0832   LEUKOCYTESUR NEGATIVE 07/31/2020 0832   LEUKOCYTESUR 3+ 05/16/2014 0851   Sepsis Labs: @LABRCNTIP (procalcitonin:4,lacticidven:4) ) Recent Results (from the past 240 hour(s))  Resp Panel by RT-PCR (Flu A&B, Covid) Nasopharyngeal Swab     Status: Abnormal   Collection Time: 07/31/20  8:32 AM   Specimen: Nasopharyngeal Swab; Nasopharyngeal(NP) swabs in vial transport medium  Result Value Ref Range Status   SARS Coronavirus 2 by RT PCR POSITIVE (A) NEGATIVE Final     Comment: RESULT CALLED TO, READ BACK BY AND VERIFIED WITH: JENNIFER WHITTLEY 07/31/20 AT 1005 BY ACR (NOTE) SARS-CoV-2 target nucleic acids are DETECTED.  The SARS-CoV-2 RNA is generally detectable in upper respiratory specimens during the acute phase of infection. Positive results are indicative of the presence of the identified virus, but do not rule out bacterial infection or co-infection with other pathogens not detected by the test. Clinical correlation with patient history and other diagnostic information is necessary to determine patient infection status. The expected result is Negative.  Fact Sheet for Patients: 14/5/21  Fact Sheet for Healthcare Providers: BloggerCourse.com  This test is not yet approved or cleared by the [employer]iousBroker.[place] FDA and  has been authorized for detection and/or diagnosis of SARS-CoV-2 by FDA under an Emergency Use Authorization (EUA).  This EUA will remain in effect (meaning this test  can be used) for the duration of  the COVID-19 declaration under Section 564(b)(1) of the Act, 21 U.S.C. section 360bbb-3(b)(1), unless the authorization is terminated or revoked sooner.     Influenza A by PCR NEGATIVE NEGATIVE Final   Influenza B by PCR NEGATIVE NEGATIVE Final    Comment: (NOTE) The Xpert Xpress SARS-CoV-2/FLU/RSV plus assay is intended as an aid in the diagnosis of influenza from Nasopharyngeal swab specimens and should not be used as a sole basis for treatment. Nasal washings and aspirates are unacceptable for Xpert Xpress SARS-CoV-2/FLU/RSV testing.  Fact Sheet for Patients: [place]  Fact Sheet for Healthcare Providers: BloggerCourse.com  This test is not yet approved or cleared by the SeriousBroker.it FDA and has been authorized for detection and/or diagnosis of SARS-CoV-2 by FDA under an Emergency Use Authorization (EUA).  This EUA will remain in effect (meaning this test can be used) for the duration of the COVID-19 declaration under Section 564(b)(1) of the Act, 21 U.S.C. section 360bbb-3(b)(1), unless the authorization is terminated or revoked.  Performed at [facility], [address] [place], [place]      Radiological Exams on Admission: DG Chest [facility] 1 View  Result Date: 07/31/2020 CLINICAL DATA:  Short of breath.  Questionable sepsis.  COPD. EXAM: PORTABLE CHEST 1 VIEW COMPARISON:  08/08/2018. FINDINGS:  Lungs are hyperexpanded. The cardiopericardial silhouette is within normal limits for size. Interstitial markings are diffusely coarsened with chronic features. Patchy airspace disease is seen in the left lung, most prominently in the left upper lobe where it has a somewhat nodular/masslike character. The visualized bony structures of the thorax show no acute abnormality. Telemetry leads overlie the chest. IMPRESSION: Emphysema with patchy airspace disease in the left lung, most prominently in the left upper lobe. Features suggest pneumonia. Given the relatively focal appearance in the left upper lung, follow-up imaging recommended to ensure resolution. Electronically Signed   By: Kennith Center M.D.   On: 07/31/2020 09:35     EKG: I have personally reviewed.  Sinus rhythm, tachycardia, QTC 452, LAE, LAD, poor R wave progression, left bundle blockade.   Assessment/Plan Principal Problem:   Pneumonia due to COVID-19 virus Active Problems:   Acute on chronic respiratory failure with hypoxia (HCC)   Asthma exacerbation   COPD exacerbation (HCC)   Diabetes mellitus without complication (HCC)   Sepsis (HCC)   Hypokalemia   Hypomagnesemia   Elevated troponin   Bronchiectasis (HCC)   NSTEMI (non-ST elevated myocardial infarction) (HCC)   Acute on chronic respiratory failure with hypoxia and sepsis due to pneumonia due to COVID-19 virus, and COPD/Asthma exacerbation and hx of  bronchiectasis: Patient is normally on as needed 2 L oxygen, currently on 5 L oxygen. Chest x-ray showed patchy infiltration left lower lobe and emphysema. Patient meets criteria for sepsis with fever, tachycardia with heart rate of 116, tachypnea with RR 37, temperature 104. Lactic acid is normal 1.3. No leukocytosis.  -will admit to med-surg bed as inpt - Z pak (patient received 1 dose of cefepime in ED) -Remdesivir per pharm -Baricitinib started -Solumedrol 40 mg bid -vitamin C, zinc.  -Bronchodilators -PRN Mucinex for cough -f/u Blood culture and sputum culture -Gentle IV fluid: 2L LR in ED -D-dimer, BNP,Trop, LFT, CRP, LDH, Procalcitonin, Ferritin, fibinogen, TG, Hep B SAg -Daily CRP, Ferritin, D-dimer, -Will ask the patient to maintain an awake prone position for 16+ hours a day, if possible, with a minimum of 2-3 hours at a time -Will attempt to maintain euvolemia to a net negative fluid status -IF patient deteriorates, will consult PCCM and ID  Asthma exacerbation, COPD exacerbation and bronchiectasis: -See above  Diabetes mellitus without complication [facility]): Recent A1c 9.8, poorly controlled. Patient still taking Jardiance and Amaryl at home -SSI  Electrolytes disturbance including hypocalcemia, Hypokalemia and hypomagnesemia: K 3.4, Mg 1.6 and calcium 6.8 which is corrected to 7.8 -Repleted all  -check Vd 1,25 level -start Vd-calcium supplement  Elevated troponin and NSTEMI: Troponin 30, 125, 360. Patient has left bundle blockage on EKG, but no chest pain. Dr.Callwood of card is consulted -Continue aspirin -start lipitor  -Trend troponin -Check A1c, FLP -Repeat EKG in morning -2d echo - IV heparin per Dr. Juliann Pares    DVT ppx: on IV heparin Code Status: Partial code per his wife who is POA (PCR OK, no intubati0n) Family Communication: Yes, patient's wife by phone Disposition Plan:  Anticipate discharge back to previous environment Consults called: Dr. Juliann Pares of  card Admission status: Med-surg bed as inpt       Status is: Inpatient  Remains inpatient appropriate because:Inpatient level of care appropriate due to severity of illness.  Patient has multiple comorbidities, now presents with acute on chronic respiratory failure due to COVID-19 pneumonia and COPD/asthma exacerbation.  Patient also has elevated troponin and sepsis.  His presentation is highly complicated.  Patient is high risk of deteriorating.  Need to be treated in hospital for at least 2 days.   Dispo: The patient is from: Home              Anticipated d/c is to: Home              Anticipated d/c date is: 2 days              Patient currently is not medically stable to d/c.          Date of Service 07/31/2020    Lorretta HarpXilin Ezell Poke [facility]   If 7PM-7AM, please contact night-coverage [email or web address] 07/31/2020, 3:13 PM

## 2020-07-31 NOTE — ED Notes (Signed)
Updated wife again at this time, no changes in status. Wife continues to ask if she can come see him. Educated again reasoning behind no visitors for covid patients. Informed her that she could have someone bring a phone for facetime if she wanted to see him. Informed her that he was in ED, and we would get belongings to him if brought, encouraged her to stay home and quarantine and seek treatment if needed. Verbalized understanding.

## 2020-07-31 NOTE — ED Notes (Signed)
Pt Spo2 dropped to 89%, pt O2 increased to 4 liters with no improvement in SpO2. O2 increased to 5 liters. Pt with increased RR

## 2020-08-01 ENCOUNTER — Inpatient Hospital Stay
Admit: 2020-08-01 | Discharge: 2020-08-01 | Disposition: A | Discharge status: Home / Self Care | Payer: Medicare Other | Attending: Internal Medicine | Admitting: Internal Medicine

## 2020-08-01 ENCOUNTER — Other Ambulatory Visit: Payer: Self-pay

## 2020-08-01 ENCOUNTER — Encounter: Payer: Self-pay | Admitting: Internal Medicine

## 2020-08-01 LAB — CBC WITH DIFFERENTIAL/PLATELET
Abs Immature Granulocytes: 0.09 10*3/uL — ABNORMAL HIGH (ref 0.00–0.07)
Basophils Absolute: 0 10*3/uL (ref 0.0–0.1)
Basophils Relative: 0 %
Eosinophils Absolute: 0 10*3/uL (ref 0.0–0.5)
Eosinophils Relative: 0 %
HCT: 37.6 % — ABNORMAL LOW (ref 39.0–52.0)
Hemoglobin: 12 g/dL — ABNORMAL LOW (ref 13.0–17.0)
Immature Granulocytes: 1 %
Lymphocytes Relative: 6 %
Lymphs Abs: 1.2 10*3/uL (ref 0.7–4.0)
MCH: 30.7 pg (ref 26.0–34.0)
MCHC: 31.9 g/dL (ref 30.0–36.0)
MCV: 96.2 fL (ref 80.0–100.0)
Monocytes Absolute: 1.2 10*3/uL — ABNORMAL HIGH (ref 0.1–1.0)
Monocytes Relative: 7 %
Neutro Abs: 16.4 10*3/uL — ABNORMAL HIGH (ref 1.7–7.7)
Neutrophils Relative %: 86 %
Platelets: 213 10*3/uL (ref 150–400)
RBC: 3.91 MIL/uL — ABNORMAL LOW (ref 4.22–5.81)
RDW: 14.1 % (ref 11.5–15.5)
WBC: 18.9 10*3/uL — ABNORMAL HIGH (ref 4.0–10.5)
nRBC: 0 % (ref 0.0–0.2)

## 2020-08-01 LAB — COMPREHENSIVE METABOLIC PANEL
ALT: 12 U/L (ref 0–44)
AST: 23 U/L (ref 15–41)
Albumin: 3 g/dL — ABNORMAL LOW (ref 3.5–5.0)
Alkaline Phosphatase: 56 U/L (ref 38–126)
Anion gap: 6 (ref 5–15)
BUN: 41 mg/dL — ABNORMAL HIGH (ref 8–23)
CO2: 24 mmol/L (ref 22–32)
Calcium: 8.3 mg/dL — ABNORMAL LOW (ref 8.9–10.3)
Chloride: 107 mmol/L (ref 98–111)
Creatinine, Ser: 1.35 mg/dL — ABNORMAL HIGH (ref 0.61–1.24)
GFR, Estimated: 51 mL/min — ABNORMAL LOW (ref >60)
Glucose, Bld: 175 mg/dL — ABNORMAL HIGH (ref 70–99)
Potassium: 4.5 mmol/L (ref 3.5–5.1)
Sodium: 137 mmol/L (ref 135–145)
Total Bilirubin: 0.8 mg/dL (ref 0.3–1.2)
Total Protein: 6.2 g/dL — ABNORMAL LOW (ref 6.5–8.1)

## 2020-08-01 LAB — ECHOCARDIOGRAM COMPLETE
Height: 69 in
S' Lateral: 4.77 cm
Weight: 2560 oz

## 2020-08-01 LAB — TROPONIN I (HIGH SENSITIVITY)
Troponin I (High Sensitivity): 603 ng/L (ref <18)
Troponin I (High Sensitivity): 578 ng/L (ref <18)

## 2020-08-01 LAB — HEPARIN LEVEL (UNFRACTIONATED)
Heparin Unfractionated: 0.25 IU/mL — ABNORMAL LOW (ref 0.30–0.70)
Heparin Unfractionated: 0.37 IU/mL (ref 0.30–0.70)
Heparin Unfractionated: <0.1 IU/mL — ABNORMAL LOW (ref 0.30–0.70)

## 2020-08-01 LAB — CBG MONITORING, ED: Glucose-Capillary: 151 mg/dL — ABNORMAL HIGH (ref 70–99)

## 2020-08-01 LAB — LIPID PANEL
Cholesterol: 113 mg/dL (ref 0–200)
HDL: 61 mg/dL (ref >40)
LDL Cholesterol: 48 mg/dL (ref 0–99)
Total CHOL/HDL Ratio: 1.9 RATIO
Triglycerides: 21 mg/dL (ref <150)
VLDL: 4 mg/dL (ref 0–40)

## 2020-08-01 LAB — GLUCOSE, CAPILLARY
Glucose-Capillary: 128 mg/dL — ABNORMAL HIGH (ref 70–99)
Glucose-Capillary: 199 mg/dL — ABNORMAL HIGH (ref 70–99)
Glucose-Capillary: 98 mg/dL (ref 70–99)

## 2020-08-01 LAB — URINE CULTURE: Culture: NO GROWTH

## 2020-08-01 LAB — CALCITRIOL (1,25 DI-OH VIT D): Vit D, 1,25-Dihydroxy: 35.3 pg/mL (ref 19.9–79.3)

## 2020-08-01 LAB — FERRITIN: Ferritin: 100 ng/mL (ref 24–336)

## 2020-08-01 LAB — C-REACTIVE PROTEIN: CRP: 12.1 mg/dL — ABNORMAL HIGH (ref <1.0)

## 2020-08-01 LAB — MAGNESIUM: Magnesium: 2.5 mg/dL — ABNORMAL HIGH (ref 1.7–2.4)

## 2020-08-01 LAB — FIBRIN DERIVATIVES D-DIMER (ARMC ONLY): Fibrin derivatives D-dimer (ARMC): 1181.99 ng/mL (FEU) — ABNORMAL HIGH (ref 0.00–499.00)

## 2020-08-01 MED ORDER — HEPARIN BOLUS VIA INFUSION
1000.0000 [IU] | Freq: Once | INTRAVENOUS | Status: AC
  Administered 2020-08-01: 1000 [IU] via INTRAVENOUS
  Filled 2020-08-01: qty 1000

## 2020-08-01 MED ORDER — HEPARIN BOLUS VIA INFUSION
2100.0000 [IU] | Freq: Once | INTRAVENOUS | Status: AC
  Administered 2020-08-01: 2100 [IU] via INTRAVENOUS
  Filled 2020-08-01: qty 2100

## 2020-08-01 MED ORDER — FLUTICASONE FUROATE-VILANTEROL 100-25 MCG/INH IN AEPB
1.0000 | INHALATION_SPRAY | Freq: Every day | RESPIRATORY_TRACT | Status: DC
  Administered 2020-08-01 – 2020-08-03 (×3): 1 via RESPIRATORY_TRACT
  Filled 2020-08-01 (×2): qty 28

## 2020-08-01 MED ORDER — DILTIAZEM HCL ER COATED BEADS 120 MG PO CP24
120.0000 mg | ORAL_CAPSULE | Freq: Every day | ORAL | Status: DC
  Administered 2020-08-01 – 2020-08-03 (×3): 120 mg via ORAL
  Filled 2020-08-01 (×3): qty 1

## 2020-08-01 MED ORDER — FLUTICASONE-UMECLIDIN-VILANT 100-62.5-25 MCG/INH IN AEPB: 1.0000 | INHALATION_SPRAY | Freq: Every day | RESPIRATORY_TRACT | Status: DC

## 2020-08-01 MED ORDER — UMECLIDINIUM BROMIDE 62.5 MCG/INH IN AEPB
1.0000 | INHALATION_SPRAY | Freq: Every day | RESPIRATORY_TRACT | Status: DC
  Administered 2020-08-02 – 2020-08-03 (×2): 1 via RESPIRATORY_TRACT
  Filled 2020-08-01 (×2): qty 7

## 2020-08-01 MED ORDER — HYDROXYZINE HCL 25 MG PO TABS
25.0000 mg | ORAL_TABLET | Freq: Two times a day (BID) | ORAL | Status: DC | PRN
  Filled 2020-08-01: qty 1

## 2020-08-01 MED ORDER — ORAL CARE MOUTH RINSE
15.0000 mL | Freq: Two times a day (BID) | OROMUCOSAL | Status: DC
  Administered 2020-08-01 – 2020-08-03 (×4): 15 mL via OROMUCOSAL

## 2020-08-01 MED ORDER — ASPIRIN EC 81 MG PO TBEC
81.0000 mg | DELAYED_RELEASE_TABLET | Freq: Every day | ORAL | Status: DC
  Administered 2020-08-01 – 2020-08-03 (×3): 81 mg via ORAL
  Filled 2020-08-01 (×3): qty 1

## 2020-08-01 NOTE — Consult Note (Signed)
ANTICOAGULATION CONSULT NOTE - Initial Consult  Pharmacy Consult for Heparin Drip Indication: chest pain/ACS/STEMI  Allergies  Allergen Reactions  . Metoprolol     Pt states syncope with lopressor    Patient Measurements: Height: 5\' 9"  (175.3 cm) Weight: 72.6 kg (160 lb) IBW/kg (Calculated) : 70.7 Heparin Dosing Weight: 72.6kg  Vital Signs: Temp: 98.1 F (36.7 C) (12/06 0015) Temp Source: Oral (12/06 0015) BP: 115/74 (12/06 0015) Pulse Rate: 77 (12/06 0015)  Labs: Recent Labs    07/31/20 0832 07/31/20 0832 07/31/20 1040 07/31/20 1340 07/31/20 1716 07/31/20 2349  HGB 14.0  --   --   --   --   --   HCT 43.1  --   --   --   --   --   PLT 255  --   --   --   --   --   APTT 26  --   --   --   --   --   LABPROT 13.6  --   --   --   --   --   INR 1.1  --   --   --   --   --   HEPARINUNFRC  --   --   --   --   --  <0.10*  CREATININE 1.00  --   --   --   --   --   TROPONINIHS 30*   < > 125* 363* 605*  --    < > = values in this interval not displayed.    Estimated Creatinine Clearance: 52 mL/min (by C-G formula based on SCr of 1 mg/dL).   Medical History: Past Medical History:  Diagnosis Date  . Asthma   . Bronchiectasis (HCC)   . COPD (chronic obstructive pulmonary disease) (HCC)   . Diabetes mellitus     Medications:  No PTA anticoagulant of record  Assessment: 86 y.o. male with medical history significant of COPD, diabetes mellitus, asthma, bronchiectasis, presenting with shortness breath, fever, COVID, and troponins trending up.  Pharmacy has been consulted to initiate and monitor a heparin drip.  Goal of Therapy:  Heparin level 0.3-0.7 units/ml Monitor platelets by anticoagulation protocol: Yes   Plan:   12/5:  HL @ 2349 = < 0.1 Will order Heparin 2100 units IV X 1 and increase drip rate to 1150 units/hr.  Will recheck HL 8 hrs after rate change.   14/5, PharmD Clinical Pharmacist 08/01/2020 1:54 AM

## 2020-08-01 NOTE — Progress Notes (Signed)
*  PRELIMINARY RESULTS* Echocardiogram 2D Echocardiogram has been performed.  Cristela Blue 08/01/2020, 8:04 AM

## 2020-08-01 NOTE — Progress Notes (Signed)
PROGRESS NOTE    George Hensley  ATF:[id number] DOB: [birth year] DOA: 07/31/2020 PCP: Rusty Aus, MD   Brief Narrative: 86 y.o. male with medical history significant of COPD (on 2 L oxygen sometimes), diabetes mellitus, asthma, bronchiectasis, who presents with shortness breath and fever.  Patient states that he started having shortness breath since last night, it has been progressively worsening. Patient also has cough with yellow-colored mucus production per his wife.  No chest pain.  Patient has chills and fever with body temperature 103.1 at home. His temperature was 104 initially ED. He has nausea, no vomiting, diarrhea or abdominal pain  pt was found to have positive Covid PCR.  Also found to have elevated troponin concerning for NSTEMI.  Cardiology was consulted.  Patient was started on a heparin drip per ACS protocol.  Not anticipating any intervention.  Also started on remdesivir and intravenous steroids for COVID-19 pneumonia.  Assessment & Plan:   Principal Problem:   Pneumonia due to COVID-19 virus Active Problems:   Acute on chronic respiratory failure with hypoxia (HCC)   Asthma exacerbation   COPD exacerbation (HCC)   Diabetes mellitus without complication (HCC)   Sepsis ([place])   Hypokalemia   Hypomagnesemia   Elevated troponin   Bronchiectasis (HCC)   NSTEMI (non-ST elevated myocardial infarction) ([place])  Multifocal pneumonia due to COVID-19 Sepsis due to above Acute on chronic hypoxic respiratory failure secondary to above Possible concomitant COPD/asthma exacerbation history of bronchiectasis Chest x-ray demonstrating patchy infiltrate left lower lobe Evidence of emphysema on imaging Has been improving over interval Sepsis criteria met with tachycardia, tachypnea, fever Plan: Continue remdesivir, dose 2/5 Continue baricitinib, dose 2/14 IV Solu-Medrol, dose 2/10 Supplemental oxygen, wean as tolerated Stress incentive spirometry and flutter use As  needed bronchodilators As needed antitussives Prone as tolerated Maintain net 0 fluid balance  Elevated troponin Demand ischemia versus NSTEMI No ischemic changes on EKG No chest pain Cardiology consulted from admission Plan: IV heparin Continue aspirin Continue Lipitor Trend troponin to peak EKG as needed chest pain 2D echocardiogram Follow any further cardiac recommendations  Diabetes mellitus without complication Recent Y7C 9.8.  Indicate poor control Patient on Jardiance and Amaryl at home Plan: Hold oral agents Sliding scale coverage  Hypocalcemia Hypokalemia Hypomagnesemia Repleted Monitoring continue to replace as necessary    DVT prophylaxis: IV heparin Code Status: Partial Family Communication: None today Disposition Plan: Status is: Inpatient  Remains inpatient appropriate because:Inpatient level of care appropriate due to severity of illness   Dispo: The patient is from: Home              Anticipated d/c is to: Home              Anticipated d/c date is: 2 days              Patient currently is not medically stable to d/c.  Patient remains on IV heparin infusion for presumed NSTEMI.  Relatively mild presentation of COVID-19 pneumonia thus far.  Anticipate discharge within 48 hours.       Consultants:   Cardiology  Procedures:   None  Antimicrobials:  Remdesivir   Subjective: Seen and examined.  Reports improvement in shortness of breath.  Denies chest pain.  No other pain complaints  Objective: Vitals:   08/01/20 0249 08/01/20 0645 08/01/20 1059 08/01/20 1233  BP: 101/64 96/62 (!) 93/56 (!) 96/54  Pulse: 74 78 69 70  Resp: $Remo'20 19 19 20  'nPLhf$ Temp:   98.2 F (36.8  C) 97.7 F (36.5 C)  TempSrc:   Oral Oral  SpO2: 99% 100% 100% 100%  Weight:      Height:        Intake/Output Summary (Last 24 hours) at 08/01/2020 1241 Last data filed at 08/01/2020 0305 Gross per 24 hour  Intake 133.79 ml  Output 500 ml  Net -366.21 ml   Filed  Weights   07/31/20 0828  Weight: 72.6 kg    Examination:  General exam: Appears calm and comfortable  Respiratory system: Scattered and expiratory wheeze, bibasilar crackles, normal work of breathing, 2 L Cardiovascular system: S1 & S2 heard, RRR. No JVD, murmurs, rubs, gallops or clicks. No pedal edema. Gastrointestinal system: Abdomen is nondistended, soft and nontender. No organomegaly or masses felt. Normal bowel sounds heard. Central nervous system: Alert and oriented. No focal neurological deficits. Extremities: Symmetric 5 x 5 power. Skin: No rashes, lesions or ulcers Psychiatry: Judgement and insight appear normal. Mood & affect appropriate.     Data Reviewed: I have personally reviewed following labs and imaging studies  CBC: Recent Labs  Lab 07/31/20 0832  WBC 9.2  NEUTROABS 8.2*  HGB 14.0  HCT 43.1  MCV 94.3  PLT 825   Basic Metabolic Panel: Recent Labs  Lab 07/31/20 0832  NA 139  K 3.4*  CL 112*  CO2 19*  GLUCOSE 136*  BUN 19  CREATININE 1.00  CALCIUM 6.8*  MG 1.6*  PHOS 2.7   GFR: Estimated Creatinine Clearance: 52 mL/min (by C-G formula based on SCr of 1 mg/dL). Liver Function Tests: Recent Labs  Lab 07/31/20 0832  AST 14*  ALT 10  ALKPHOS 50  BILITOT 1.1  PROT 5.6*  ALBUMIN 2.7*   No results for input(s): LIPASE, AMYLASE in the last 168 hours. No results for input(s): AMMONIA in the last 168 hours. Coagulation Profile: Recent Labs  Lab 07/31/20 0832  INR 1.1   Cardiac Enzymes: No results for input(s): CKTOTAL, CKMB, CKMBINDEX, TROPONINI in the last 168 hours. BNP (last 3 results) No results for input(s): PROBNP in the last 8760 hours. HbA1C: Recent Labs    07/31/20 1040  HGBA1C 7.7*   CBG: Recent Labs  Lab 07/31/20 1341 07/31/20 1714 07/31/20 2335 08/01/20 1030  GLUCAP 117* 218* 330* 151*   Lipid Profile: Recent Labs    07/31/20 1040  TRIG 32   Thyroid Function Tests: No results for input(s): TSH, T4TOTAL,  FREET4, T3FREE, THYROIDAB in the last 72 hours. Anemia Panel: Recent Labs    07/31/20 0832  FERRITIN 56   Sepsis Labs: Recent Labs  Lab 07/31/20 0832 07/31/20 1040  PROCALCITON 0.12  --   LATICACIDVEN 1.3 1.2    Recent Results (from the past 240 hour(s))  Resp Panel by RT-PCR (Flu A&B, Covid) Nasopharyngeal Swab     Status: Abnormal   Collection Time: 07/31/20  8:32 AM   Specimen: Nasopharyngeal Swab; Nasopharyngeal(NP) swabs in vial transport medium  Result Value Ref Range Status   SARS Coronavirus 2 by RT PCR POSITIVE (A) NEGATIVE Final    Comment: RESULT CALLED TO, READ BACK BY AND VERIFIED WITH: JENNIFER WHITTLEY 07/31/20 AT 1005 BY ACR (NOTE) SARS-CoV-2 target nucleic acids are DETECTED.  The SARS-CoV-2 RNA is generally detectable in upper respiratory specimens during the acute phase of infection. Positive results are indicative of the presence of the identified virus, but do not rule out bacterial infection or co-infection with other pathogens not detected by the test. Clinical correlation with patient history and other  diagnostic information is necessary to determine patient infection status. The expected result is Negative.  Fact Sheet for Patients: EntrepreneurPulse.com.au  Fact Sheet for Healthcare Providers: IncredibleEmployment.be  This test is not yet approved or cleared by the [place] FDA and  has been authorized for detection and/or diagnosis of SARS-CoV-2 by FDA under an Emergency Use Authorization (EUA).  This EUA will remain in effect (meaning this test  can be used) for the duration of  the COVID-19 declaration under Section 564(b)(1) of the Act, 21 U.S.C. section 360bbb-3(b)(1), unless the authorization is terminated or revoked sooner.     Influenza A by PCR NEGATIVE NEGATIVE Final   Influenza B by PCR NEGATIVE NEGATIVE Final    Comment: (NOTE) The Xpert Xpress SARS-CoV-2/FLU/RSV plus assay is intended as  an aid in the diagnosis of influenza from Nasopharyngeal swab specimens and should not be used as a sole basis for treatment. Nasal washings and aspirates are unacceptable for Xpert Xpress SARS-CoV-2/FLU/RSV testing.  Fact Sheet for Patients: EntrepreneurPulse.com.au  Fact Sheet for Healthcare Providers: IncredibleEmployment.be  This test is not yet approved or cleared by the [place] FDA and has been authorized for detection and/or diagnosis of SARS-CoV-2 by FDA under an Emergency Use Authorization (EUA). This EUA will remain in effect (meaning this test can be used) for the duration of the COVID-19 declaration under Section 564(b)(1) of the Act, 21 U.S.C. section 360bbb-3(b)(1), unless the authorization is terminated or revoked.  Performed at [facility], [address] [place], [place] [address]   Blood Culture (routine x 2)     Status: None (Preliminary result)   Collection Time: 07/31/20  8:32 AM   Specimen: BLOOD  Result Value Ref Range Status   Specimen Description BLOOD LEFT FA  Final   Special Requests   Final    BOTTLES DRAWN AEROBIC AND ANAEROBIC Blood Culture adequate volume   Culture   Final    NO GROWTH < 24 HOURS Performed at [facility], [address], [place], [place] [address]    Report Status PENDING  Incomplete  Blood Culture (routine x 2)     Status: None (Preliminary result)   Collection Time: 07/31/20  8:32 AM   Specimen: BLOOD  Result Value Ref Range Status   Specimen Description BLOOD LEFT HAND  Final   Special Requests   Final    BOTTLES DRAWN AEROBIC AND ANAEROBIC Blood Culture adequate volume   Culture   Final    NO GROWTH < 24 HOURS Performed at [facility], [address] [place], [place] [address]    Report Status PENDING  Incomplete  Urine culture     Status: None   Collection Time: 07/31/20  8:32 AM   Specimen: In/Out Cath Urine  Result Value Ref Range Status    Specimen Description   Final    IN/OUT CATH URINE Performed at [facility], [address], [place], [place] [address]    Special Requests   Final    NONE Performed at [facility], [address] [place], [place] [address]    Culture   Final    NO GROWTH Performed at [facility], [place] [address] [place], [place] [address]    Report Status 08/01/2020 FINAL  Final         Radiology Studies: DG Chest Port 1 View  Result Date: 07/31/2020 CLINICAL DATA:  Short of breath.  Questionable sepsis.  COPD. EXAM: PORTABLE CHEST 1 VIEW COMPARISON:  08/08/2018. FINDINGS: Lungs are hyperexpanded. The cardiopericardial silhouette is within normal limits for size. Interstitial markings are diffusely coarsened with chronic features. Patchy airspace disease is seen in the left lung, most prominently in the left upper lobe where it has a somewhat nodular/masslike character. The visualized bony structures of the thorax show no acute abnormality. Telemetry leads overlie the chest. IMPRESSION: Emphysema with patchy airspace disease in the left lung, most prominently in the left upper lobe. Features suggest pneumonia. Given the relatively focal appearance in the left upper lung, follow-up imaging recommended to ensure resolution. Electronically Signed   By: Misty Stanley M.D.   On: 07/31/2020 09:35   ECHOCARDIOGRAM COMPLETE  Result Date: 08/01/2020    ECHOCARDIOGRAM REPORT   Patient Name:   George Hensley Date of Exam: 08/01/2020 Medical Rec #:  [id number]       Height:       69.0 in Accession #:    [id number]      Weight:       160.0 lb Date of Birth:  05-Jul-1931      BSA:          1.879 m Patient Age:    10 years        BP:           96/62 mmHg Patient Gender: M               HR:           78 bpm. Exam Location:  [facility] Procedure: 2D Echo, Cardiac Doppler and Color Doppler Indications:     Elevated troponin  History:         Patient has no prior history of Echocardiogram  examinations.                  COPD; Risk Factors:Diabetes.  Sonographer:     Sherrie Sport RDCS (AE) Referring Phys:  Baker Janus Soledad Gerlach NIU Diagnosing Phys: Bartholome Bill MD  Sonographer Comments: Technically challenging study due to limited acoustic windows and no apical window. Image acquisition challenging due to COPD. IMPRESSIONS  1. Left ventricular ejection fraction, by estimation, is 30 to 35%. The left ventricle has moderate to severely decreased function. The left ventricle demonstrates global hypokinesis. The left ventricular internal cavity size was mildly dilated. Left ventricular diastolic parameters were normal.  2. Right ventricular systolic function is normal. The right ventricular size is normal.  3. Left atrial size was mildly dilated.  4. The mitral valve is grossly normal. Trivial mitral valve regurgitation.  5. The aortic valve was not well visualized. Aortic valve regurgitation is trivial. FINDINGS  Left Ventricle: Left ventricular ejection fraction, by estimation, is 30 to 35%. The left ventricle has moderate to severely decreased function. The left ventricle demonstrates global hypokinesis. The left ventricular internal cavity size was mildly dilated. There is borderline left ventricular hypertrophy. Left ventricular diastolic parameters were normal. Right Ventricle: The right ventricular size is normal. No increase in right ventricular wall thickness. Right ventricular systolic function is normal. Left Atrium: Left atrial size was mildly dilated. Right Atrium: Right atrial size was normal in size. Pericardium: There is no evidence of pericardial effusion. Mitral Valve: The mitral valve is grossly normal. Trivial mitral valve regurgitation. Tricuspid Valve: The tricuspid valve is not well visualized. Tricuspid valve regurgitation is trivial. Aortic Valve: The aortic valve was not well visualized. Aortic valve regurgitation is trivial. Pulmonic Valve: The pulmonic valve was not well visualized. Pulmonic  valve regurgitation is trivial. Aorta: The  aortic root is normal in size and structure and the aortic root was not well visualized. IAS/Shunts: The interatrial septum was not assessed.  LEFT VENTRICLE PLAX 2D LVIDd:         5.37 cm LVIDs:         4.77 cm LV PW:         1.25 cm LV IVS:        1.00 cm LVOT diam:     2.10 cm LVOT Area:     3.46 cm  LEFT ATRIUM         Index LA diam:    3.40 cm 1.81 cm/m                        PULMONIC VALVE AORTA                 PV Vmax:        0.55 m/s Ao Root diam: 2.90 cm PV Peak grad:   1.2 mmHg                       RVOT Peak grad: 2 mmHg  TRICUSPID VALVE TR Peak grad:   5.2 mmHg TR Vmax:        114.00 cm/s  SHUNTS Systemic Diam: 2.10 cm Bartholome Bill MD Electronically signed by Bartholome Bill MD Signature Date/Time: 08/01/2020/11:52:33 AM    Final         Scheduled Meds: . vitamin C  500 mg Oral Daily  . aspirin EC  81 mg Oral Daily  . atorvastatin  40 mg Oral Daily  . azithromycin  250 mg Oral Daily  . baricitinib  4 mg Oral Daily  . calcium-vitamin D  1 tablet Oral Q breakfast  . diltiazem  120 mg Oral Daily  . fluticasone furoate-vilanterol  1 puff Inhalation Daily   And  . umeclidinium bromide  1 puff Inhalation Daily  . insulin aspart  0-5 Units Subcutaneous QHS  . insulin aspart  0-9 Units Subcutaneous TID WC  . ipratropium  2 puff Inhalation Q4H  . methylPREDNISolone (SOLU-MEDROL) injection  40 mg Intravenous Q12H  . zinc sulfate  220 mg Oral Daily   Continuous Infusions: . heparin 1,150 Units/hr (08/01/20 0242)  . remdesivir 100 mg in NS 100 mL 100 mg (08/01/20 1043)     LOS: 1 day    Time spent: 35 minutes    Sidney Ace, MD [facility] Pager [phone]  If 7PM-7AM, please contact night-coverage 08/01/2020, 12:41 PM

## 2020-08-01 NOTE — Consult Note (Signed)
ANTICOAGULATION CONSULT NOTE  Pharmacy Consult for Heparin Drip Indication: chest pain/ACS/STEMI  Allergies  Allergen Reactions  . Metoprolol     Pt states syncope with lopressor    Patient Measurements: Height: 5\' 9"  (175.3 cm) Weight: 72.6 kg (160 lb) IBW/kg (Calculated) : 70.7 Heparin Dosing Weight: 72.6kg  Vital Signs: Temp: 97.7 F (36.5 C) (12/06 1233) Temp Source: Oral (12/06 1233) BP: 96/54 (12/06 1233) Pulse Rate: 70 (12/06 1233)  Labs: Recent Labs    07/31/20 0832 07/31/20 1040 07/31/20 1340 07/31/20 1716 07/31/20 2349 08/01/20 1057 08/01/20 1330  HGB 14.0  --   --   --   --   --  12.0*  HCT 43.1  --   --   --   --   --  37.6*  PLT 255  --   --   --   --   --  213  APTT 26  --   --   --   --   --   --   LABPROT 13.6  --   --   --   --   --   --   INR 1.1  --   --   --   --   --   --   HEPARINUNFRC  --   --   --   --  <0.10* 0.25*  --   CREATININE 1.00  --   --   --   --   --  1.35*  TROPONINIHS 30*   < > 363* 605*  --   --  603*   < > = values in this interval not displayed.    Estimated Creatinine Clearance: 38.6 mL/min (A) (by C-G formula based on SCr of 1.35 mg/dL (H)).   Medical History: Past Medical History:  Diagnosis Date  . Asthma   . Bronchiectasis (HCC)   . COPD (chronic obstructive pulmonary disease) (HCC)   . Diabetes mellitus     Medications:  No PTA anticoagulant of record  Assessment: 86 y.o. male with medical history significant of COPD, diabetes mellitus, asthma, bronchiectasis, presenting with shortness breath, fever, COVID, and troponins trending up.  Pharmacy has been consulted to initiate and monitor a heparin drip.  Goal of Therapy:  Heparin level 0.3-0.7 units/ml Monitor platelets by anticoagulation protocol: Yes   Plan:  12/6 1057 HL 0.25 subtherapeutic. Heparin 1000 unit bolus followed by increase in heparin drip to 1300 units/hr. Recheck HL at 2200. CBC daily while on heparin drip.   14/6,  PharmD Clinical Pharmacist 08/01/2020 3:05 PM

## 2020-08-01 NOTE — Progress Notes (Signed)
[facility] Cardiology    SUBJECTIVE: Patient states that he feels reasonably well not as short of breath no chest pain no leg edema sitting up eating he has no main complaints still gets short of breath obese exerts himself but at rest he feels reasonably well   Vitals:   08/01/20 0015 08/01/20 0155 08/01/20 0249 08/01/20 0645  BP: 115/74 118/74 101/64 96/62  Pulse: 77 78 74 78  Resp: 17 17 20 19   Temp: 98.1 F (36.7 C)     TempSrc: Oral     SpO2: 96% 98% 99% 100%  Weight:      Height:         Intake/Output Summary (Last 24 hours) at 08/01/2020 1012 Last data filed at 08/01/2020 0305 Gross per 24 hour  Intake 133.79 ml  Output 500 ml  Net -366.21 ml      PHYSICAL EXAM  General: Well developed, well nourished, in no acute distress HEENT:  Normocephalic and atramatic Neck:  No JVD.  Lungs: Diffuse rhonchi bilaterally to auscultation and percussion. Heart: HRRR . Normal S1 and S2 without gallops or murmurs.  Abdomen: Bowel sounds are positive, abdomen soft and non-tender  Msk:  Back normal, normal gait. Normal strength and tone for age. Extremities: No clubbing, cyanosis or edema.   Neuro: Alert and oriented X 3. Psych:  Good affect, responds appropriately   LABS: Basic Metabolic Panel: Recent Labs    07/31/20 0832  NA 139  K 3.4*  CL 112*  CO2 19*  GLUCOSE 136*  BUN 19  CREATININE 1.00  CALCIUM 6.8*  MG 1.6*  PHOS 2.7   Liver Function Tests: Recent Labs    07/31/20 0832  AST 14*  ALT 10  ALKPHOS 50  BILITOT 1.1  PROT 5.6*  ALBUMIN 2.7*   No results for input(s): LIPASE, AMYLASE in the last 72 hours. CBC: Recent Labs    07/31/20 0832  WBC 9.2  NEUTROABS 8.2*  HGB 14.0  HCT 43.1  MCV 94.3  PLT 255   Cardiac Enzymes: No results for input(s): CKTOTAL, CKMB, CKMBINDEX, TROPONINI in the last 72 hours. BNP: Invalid input(s): POCBNP D-Dimer: No results for input(s): DDIMER in the last 72 hours. Hemoglobin A1C: Recent Labs    07/31/20 1040   HGBA1C 7.7*   Fasting Lipid Panel: Recent Labs    07/31/20 1040  TRIG 32   Thyroid Function Tests: No results for input(s): TSH, T4TOTAL, T3FREE, THYROIDAB in the last 72 hours.  Invalid input(s): FREET3 Anemia Panel: Recent Labs    07/31/20 0832  FERRITIN 56    DG Chest Port 1 View  Result Date: 07/31/2020 CLINICAL DATA:  Short of breath.  Questionable sepsis.  COPD. EXAM: PORTABLE CHEST 1 VIEW COMPARISON:  08/08/2018. FINDINGS: Lungs are hyperexpanded. The cardiopericardial silhouette is within normal limits for size. Interstitial markings are diffusely coarsened with chronic features. Patchy airspace disease is seen in the left lung, most prominently in the left upper lobe where it has a somewhat nodular/masslike character. The visualized bony structures of the thorax show no acute abnormality. Telemetry leads overlie the chest. IMPRESSION: Emphysema with patchy airspace disease in the left lung, most prominently in the left upper lobe. Features suggest pneumonia. Given the relatively focal appearance in the left upper lung, follow-up imaging recommended to ensure resolution. Electronically Signed   By: 08/10/2018 M.D.   On: 07/31/2020 09:35     Echo moderate cardiomyopathy ejection fraction between 30 and 35% presumed ischemic  TELEMETRY: Sinus/tachycardia around  110 IVCD  ASSESSMENT AND PLAN:  Principal Problem:   Pneumonia due to COVID-19 virus Active Problems:   Acute on chronic respiratory failure with hypoxia (HCC)   Asthma exacerbation   COPD exacerbation (HCC)   Diabetes mellitus without complication (HCC)   Sepsis (HCC)   Hypokalemia   Hypomagnesemia   Elevated troponin   Bronchiectasis (HCC)   NSTEMI (non-ST elevated myocardial infarction) (HCC)    Plan Continue respiratory support secondary to Covid with supplemental oxygen inhalers as necessary Agree with steroid therapy to help treat Covid pneumonia Short-term anticoagulation with heparin because  of elevated troponins Troponins probably represent non-STEMI but the patient hemodynamically stable from a cardiac standpoint without any chest pain so recommend conservative medical therapy Echocardiogram to assess left ventricular function Continue current therapy for cardiomyopathy known coronary disease Continue conservative noninvasive approach for now   Alwyn Pea, MD 08/01/2020 10:12 AM

## 2020-08-02 LAB — CBC WITH DIFFERENTIAL/PLATELET
Abs Immature Granulocytes: 0.13 10*3/uL — ABNORMAL HIGH (ref 0.00–0.07)
Basophils Absolute: 0 10*3/uL (ref 0.0–0.1)
Basophils Relative: 0 %
Eosinophils Absolute: 0 10*3/uL (ref 0.0–0.5)
Eosinophils Relative: 0 %
HCT: 38.2 % — ABNORMAL LOW (ref 39.0–52.0)
Hemoglobin: 12.4 g/dL — ABNORMAL LOW (ref 13.0–17.0)
Immature Granulocytes: 1 %
Lymphocytes Relative: 5 %
Lymphs Abs: 0.7 10*3/uL (ref 0.7–4.0)
MCH: 31 pg (ref 26.0–34.0)
MCHC: 32.5 g/dL (ref 30.0–36.0)
MCV: 95.5 fL (ref 80.0–100.0)
Monocytes Absolute: 0.3 10*3/uL (ref 0.1–1.0)
Monocytes Relative: 2 %
Neutro Abs: 12 10*3/uL — ABNORMAL HIGH (ref 1.7–7.7)
Neutrophils Relative %: 92 %
Platelets: 207 10*3/uL (ref 150–400)
RBC: 4 MIL/uL — ABNORMAL LOW (ref 4.22–5.81)
RDW: 14.1 % (ref 11.5–15.5)
WBC: 13.1 10*3/uL — ABNORMAL HIGH (ref 4.0–10.5)
nRBC: 0 % (ref 0.0–0.2)

## 2020-08-02 LAB — GLUCOSE, CAPILLARY
Glucose-Capillary: 172 mg/dL — ABNORMAL HIGH (ref 70–99)
Glucose-Capillary: 255 mg/dL — ABNORMAL HIGH (ref 70–99)
Glucose-Capillary: 237 mg/dL — ABNORMAL HIGH (ref 70–99)
Glucose-Capillary: 221 mg/dL — ABNORMAL HIGH (ref 70–99)

## 2020-08-02 LAB — COMPREHENSIVE METABOLIC PANEL
ALT: 14 U/L (ref 0–44)
AST: 20 U/L (ref 15–41)
Albumin: 3 g/dL — ABNORMAL LOW (ref 3.5–5.0)
Alkaline Phosphatase: 57 U/L (ref 38–126)
Anion gap: 8 (ref 5–15)
BUN: 45 mg/dL — ABNORMAL HIGH (ref 8–23)
CO2: 23 mmol/L (ref 22–32)
Calcium: 8.2 mg/dL — ABNORMAL LOW (ref 8.9–10.3)
Chloride: 104 mmol/L (ref 98–111)
Creatinine, Ser: 1.22 mg/dL (ref 0.61–1.24)
GFR, Estimated: 57 mL/min — ABNORMAL LOW (ref >60)
Glucose, Bld: 181 mg/dL — ABNORMAL HIGH (ref 70–99)
Potassium: 4.6 mmol/L (ref 3.5–5.1)
Sodium: 135 mmol/L (ref 135–145)
Total Bilirubin: 1.2 mg/dL (ref 0.3–1.2)
Total Protein: 6.2 g/dL — ABNORMAL LOW (ref 6.5–8.1)

## 2020-08-02 LAB — MAGNESIUM: Magnesium: 2.4 mg/dL (ref 1.7–2.4)

## 2020-08-02 LAB — HEPARIN LEVEL (UNFRACTIONATED): Heparin Unfractionated: 0.65 IU/mL (ref 0.30–0.70)

## 2020-08-02 LAB — C-REACTIVE PROTEIN: CRP: 8.5 mg/dL — ABNORMAL HIGH (ref <1.0)

## 2020-08-02 MED ORDER — LOSARTAN POTASSIUM 25 MG PO TABS
25.0000 mg | ORAL_TABLET | Freq: Every day | ORAL | Status: DC
  Administered 2020-08-02: 13:00:00 25 mg via ORAL
  Filled 2020-08-02 (×2): qty 1

## 2020-08-02 MED ORDER — LINAGLIPTIN 5 MG PO TABS
5.0000 mg | ORAL_TABLET | Freq: Every day | ORAL | Status: DC
  Administered 2020-08-02 – 2020-08-03 (×2): 5 mg via ORAL
  Filled 2020-08-02 (×2): qty 1

## 2020-08-02 MED ORDER — MELATONIN 5 MG PO TABS
5.0000 mg | ORAL_TABLET | Freq: Every day | ORAL | Status: DC
  Administered 2020-08-02: 5 mg via ORAL
  Filled 2020-08-02 (×2): qty 1

## 2020-08-02 NOTE — Progress Notes (Signed)
Inpatient Diabetes Program Recommendations  AACE/ADA: New Consensus Statement on Inpatient Glycemic Control (2015)  Target Ranges:  Prepandial:   less than 140 mg/dL      Peak postprandial:   less than 180 mg/dL (1-2 hours)      Critically ill patients:  140 - 180 mg/dL   Lab Results  Component Value Date   GLUCAP 255 (H) 08/02/2020   HGBA1C 7.7 (H) 07/31/2020    Review of Glycemic Control Results for George Hensley, George Hensley (MRN [id number]) as of 08/02/2020 14:03  Ref. Range 08/01/2020 16:21 08/01/2020 21:29 08/02/2020 07:56 08/02/2020 11:55  Glucose-Capillary Latest Ref Range: 70 - 99 mg/dL 427 (H) 98 062 (H) 376 (H)   Diabetes history: DM 2 Outpatient Diabetes medications: Jardiance 25 mg daily, Amaryl 4 mg bid Current orders for Inpatient glycemic control:  Novolog sensitive tid with meals and HS Solumedrol 40 mg IV q 12 hours Inpatient Diabetes Program Recommendations:   Consider adding Tradjenta 5 mg daily while in the hospital.   Thanks  Beryl Meager, RN, BC-ADM Inpatient Diabetes Coordinator Pager [phone] (8a-5p)

## 2020-08-02 NOTE — Progress Notes (Signed)
PROGRESS NOTE    George Hensley  [name]:[id number] DOB: 1931/03/29 DOA: 07/31/2020 PCP: Rusty Aus, MD   Brief Narrative: 86 y.o. male with medical history significant of COPD (on 2 L oxygen sometimes), diabetes mellitus, asthma, bronchiectasis, who presents with shortness breath and fever.  Patient states that he started having shortness breath since last night, it has been progressively worsening. Patient also has cough with yellow-colored mucus production per his wife.  No chest pain.  Patient has chills and fever with body temperature 103.1 at home. His temperature was 104 initially ED. He has nausea, no vomiting, diarrhea or abdominal pain  pt was found to have positive Covid PCR.  Also found to have elevated troponin concerning for NSTEMI.  Cardiology was consulted.  Patient was started on a heparin drip per ACS protocol.  Not anticipating any intervention.  Also started on remdesivir and intravenous steroids for COVID-19 pneumonia.  12/7: Patient symptomatically improved.  Troponins trended to peak of 600, subsequently downtrending.  No ischemic changes on EKG.  No chest pain.  At this time weaned to room air.  Symptomatically feels well.  Assessment & Plan:   Principal Problem:   Pneumonia due to COVID-19 virus Active Problems:   Acute on chronic respiratory failure with hypoxia (HCC)   Asthma exacerbation   COPD exacerbation (HCC)   Diabetes mellitus without complication (HCC)   Sepsis ([place])   Hypokalemia   Hypomagnesemia   Elevated troponin   Bronchiectasis (HCC)   NSTEMI (non-ST elevated myocardial infarction) (HCC)  Multifocal pneumonia due to COVID-19 Sepsis due to above, improving Acute on chronic hypoxic respiratory failure secondary to above, improving Possible concomitant COPD/asthma exacerbation history of bronchiectasis Chest x-ray demonstrating patchy infiltrate left lower lobe Evidence of emphysema on imaging Has been improving over interval Sepsis  criteria met with tachycardia, tachypnea, fever CRP elevated to 12.1 12/7: Weaned to room air Plan: Continue remdesivir, dose 3/5 Continue baricitinib, dose 3/14 Continue IV Solu-Medrol, dose 3/10 Supplemental oxygen, wean as tolerated Stress incentive spirometry and flutter use As needed bronchodilators As needed antitussives Prone as tolerated Maintain net 0 fluid balance Patient may be medically stable for discharge soon as 08/03/2020 if cardiology not planning any further diagnostics in house  Heart failure with reduced ejection fraction 2D echocardiogram noted reduced ejection fraction of 30 to 35% Per echo report no previous echo on file No indication of acute exacerbation Plan: Continue telemetry monitoring Follow any further cardiology recommendations   Elevated troponin Demand ischemia versus NSTEMI No ischemic changes on EKG No chest pain Cardiology consulted from admission Patient has been on IV heparin since admission Troponins peaked at 600, now downtrending Plan: Continue IV heparin for 48 hours, infusion to run until 1530 today Continue aspirin Continue Lipitor No beta-blocker due to allergy EKG as needed chest pain 2D echocardiogram Follow any further cardiology recommendations  Diabetes mellitus without complication Recent I3J 9.8.  Indicate poor control Patient on Jardiance and Amaryl at home Plan: Hold oral agents Sliding scale coverage  Hypocalcemia Hypokalemia Hypomagnesemia Repleted Monitoring continue to replace as necessary    DVT prophylaxis: IV heparin Code Status: Partial Family Communication: None today Disposition Plan: Status is: Inpatient  Remains inpatient appropriate because:Inpatient level of care appropriate due to severity of illness   Dispo: The patient is from: Home              Anticipated d/c is to: Home              Anticipated  d/c date is: 1 day              Patient currently is not medically stable to d/c.    Resolving acute hypoxic respiratory failure in the setting of COVID-19 pneumonia.  Patient has NSTEMI, resolving and newly diagnosed heart failure with reduced ejection fraction.  Pending cardiology follow-up.  From a respiratory standpoint patient may be medically stable for discharge on 08/03/2020.  Therapy evaluations requested today    Consultants:   Cardiology  Procedures:   None  Antimicrobials:  Remdesivir   Subjective: Patient seen and examined.  Reports improvement in shortness of breath.  No cough.  No chest pain.  No other pain complaints.  On room air  Objective: Vitals:   08/01/20 1950 08/01/20 2300 08/02/20 0421 08/02/20 0757  BP: 102/65 98/61 107/70 123/74  Pulse: 70 70 73 88  Resp: $Remo'18 18 17 18  'UZdzo$ Temp: 98.2 F (36.8 C) 98.1 F (36.7 C) 97.8 F (36.6 C) 98.5 F (36.9 C)  TempSrc: Oral Oral    SpO2: 96% 96% 94% 94%  Weight:      Height:        Intake/Output Summary (Last 24 hours) at 08/02/2020 1031 Last data filed at 08/01/2020 1520 Gross per 24 hour  Intake 275.89 ml  Output 400 ml  Net -124.11 ml   Filed Weights   07/31/20 0828  Weight: 72.6 kg    Examination:  General exam: Appears calm and comfortable  Respiratory system: Mild bibasilar crackles.  Normal work of breathing.  Room air  cardiovascular system: S1 & S2 heard, RRR. No JVD, murmurs, rubs, gallops or clicks. No pedal edema. Gastrointestinal system: Abdomen is nondistended, soft and nontender. No organomegaly or masses felt. Normal bowel sounds heard. Central nervous system: Alert and oriented. No focal neurological deficits. Extremities: Symmetric 5 x 5 power. Skin: No rashes, lesions or ulcers Psychiatry: Judgement and insight appear normal. Mood & affect appropriate.     Data Reviewed: I have personally reviewed following labs and imaging studies  CBC: Recent Labs  Lab 07/31/20 0832 08/01/20 1330 08/02/20 0653  WBC 9.2 18.9* 13.1*  NEUTROABS 8.2* 16.4* 12.0*  HGB  14.0 12.0* 12.4*  HCT 43.1 37.6* 38.2*  MCV 94.3 96.2 95.5  PLT 255 213 025   Basic Metabolic Panel: Recent Labs  Lab 07/31/20 0832 08/01/20 1330 08/02/20 0653  NA 139 137 135  K 3.4* 4.5 4.6  CL 112* 107 104  CO2 19* 24 23  GLUCOSE 136* 175* 181*  BUN 19 41* 45*  CREATININE 1.00 1.35* 1.22  CALCIUM 6.8* 8.3* 8.2*  MG 1.6* 2.5* 2.4  PHOS 2.7  --   --    GFR: Estimated Creatinine Clearance: 42.7 mL/min (by C-G formula based on SCr of 1.22 mg/dL). Liver Function Tests: Recent Labs  Lab 07/31/20 0832 08/01/20 1330 08/02/20 0653  AST 14* 23 20  ALT $Re'10 12 14  'hgJ$ ALKPHOS 50 56 57  BILITOT 1.1 0.8 1.2  PROT 5.6* 6.2* 6.2*  ALBUMIN 2.7* 3.0* 3.0*   No results for input(s): LIPASE, AMYLASE in the last 168 hours. No results for input(s): AMMONIA in the last 168 hours. Coagulation Profile: Recent Labs  Lab 07/31/20 0832  INR 1.1   Cardiac Enzymes: No results for input(s): CKTOTAL, CKMB, CKMBINDEX, TROPONINI in the last 168 hours. BNP (last 3 results) No results for input(s): PROBNP in the last 8760 hours. HbA1C: Recent Labs    07/31/20 1040  HGBA1C 7.7*   CBG: Recent  Labs  Lab 08/01/20 1030 08/01/20 1248 08/01/20 1621 08/01/20 2129 08/02/20 0756  GLUCAP 151* 128* 199* 98 172*   Lipid Profile: Recent Labs    07/31/20 1040 08/01/20 1330  CHOL  --  113  HDL  --  61  LDLCALC  --  48  TRIG 32 21  CHOLHDL  --  1.9   Thyroid Function Tests: No results for input(s): TSH, T4TOTAL, FREET4, T3FREE, THYROIDAB in the last 72 hours. Anemia Panel: Recent Labs    07/31/20 0832 08/01/20 1330  FERRITIN 56 100   Sepsis Labs: Recent Labs  Lab 07/31/20 0832 07/31/20 1040  PROCALCITON 0.12  --   LATICACIDVEN 1.3 1.2    Recent Results (from the past 240 hour(s))  Resp Panel by RT-PCR (Flu A&B, Covid) Nasopharyngeal Swab     Status: Abnormal   Collection Time: 07/31/20  8:32 AM   Specimen: Nasopharyngeal Swab; Nasopharyngeal(NP) swabs in vial transport  medium  Result Value Ref Range Status   SARS Coronavirus 2 by RT PCR POSITIVE (A) NEGATIVE Final    Comment: RESULT CALLED TO, READ BACK BY AND VERIFIED WITH: JENNIFER WHITTLEY 07/31/20 AT 1005 BY ACR (NOTE) SARS-CoV-2 target nucleic acids are DETECTED.  The SARS-CoV-2 RNA is generally detectable in upper respiratory specimens during the acute phase of infection. Positive results are indicative of the presence of the identified virus, but do not rule out bacterial infection or co-infection with other pathogens not detected by the test. Clinical correlation with patient history and other diagnostic information is necessary to determine patient infection status. The expected result is Negative.  Fact Sheet for Patients: EntrepreneurPulse.com.au  Fact Sheet for Healthcare Providers: IncredibleEmployment.be  This test is not yet approved or cleared by the [place] FDA and  has been authorized for detection and/or diagnosis of SARS-CoV-2 by FDA under an Emergency Use Authorization (EUA).  This EUA will remain in effect (meaning this test  can be used) for the duration of  the COVID-19 declaration under Section 564(b)(1) of the Act, 21 U.S.C. section 360bbb-3(b)(1), unless the authorization is terminated or revoked sooner.     Influenza A by PCR NEGATIVE NEGATIVE Final   Influenza B by PCR NEGATIVE NEGATIVE Final    Comment: (NOTE) The Xpert Xpress SARS-CoV-2/FLU/RSV plus assay is intended as an aid in the diagnosis of influenza from Nasopharyngeal swab specimens and should not be used as a sole basis for treatment. Nasal washings and aspirates are unacceptable for Xpert Xpress SARS-CoV-2/FLU/RSV testing.  Fact Sheet for Patients: EntrepreneurPulse.com.au  Fact Sheet for Healthcare Providers: IncredibleEmployment.be  This test is not yet approved or cleared by the [place] FDA and has been  authorized for detection and/or diagnosis of SARS-CoV-2 by FDA under an Emergency Use Authorization (EUA). This EUA will remain in effect (meaning this test can be used) for the duration of the COVID-19 declaration under Section 564(b)(1) of the Act, 21 U.S.C. section 360bbb-3(b)(1), unless the authorization is terminated or revoked.  Performed at [facility], [address] [place], [place] [address]   Blood Culture (routine x 2)     Status: None (Preliminary result)   Collection Time: 07/31/20  8:32 AM   Specimen: BLOOD  Result Value Ref Range Status   Specimen Description BLOOD LEFT FA  Final   Special Requests   Final    BOTTLES DRAWN AEROBIC AND ANAEROBIC Blood Culture adequate volume   Culture   Final    NO GROWTH 2 DAYS Performed at [facility]  Lab, [place], [place] [address]    Report Status PENDING  Incomplete  Blood Culture (routine x 2)     Status: None (Preliminary result)   Collection Time: 07/31/20  8:32 AM   Specimen: BLOOD  Result Value Ref Range Status   Specimen Description BLOOD LEFT HAND  Final   Special Requests   Final    BOTTLES DRAWN AEROBIC AND ANAEROBIC Blood Culture adequate volume   Culture   Final    NO GROWTH 2 DAYS Performed at [facility], [address] [place], [place] [address]    Report Status PENDING  Incomplete  Urine culture     Status: None   Collection Time: 07/31/20  8:32 AM   Specimen: In/Out Cath Urine  Result Value Ref Range Status   Specimen Description   Final    IN/OUT CATH URINE Performed at [facility], [address] [place], [place] [address]    Special Requests   Final    NONE Performed at [facility], [address], [place], [place] [address]    Culture   Final    NO GROWTH Performed at [facility], [place] [address], [place], [place] [address]    Report Status 08/01/2020 FINAL  Final         Radiology Studies: ECHOCARDIOGRAM  COMPLETE  Result Date: 08/01/2020    ECHOCARDIOGRAM REPORT   Patient Name:   George Hensley Date of Exam: 08/01/2020 Medical Rec #:  [id number]       Height:       69.0 in Accession #:    [id number]      Weight:       160.0 lb Date of Birth:  1931/07/21      BSA:          1.879 m Patient Age:    28 years        BP:           96/62 mmHg Patient Gender: M               HR:           78 bpm. Exam Location:  [facility] Procedure: 2D Echo, Cardiac Doppler and Color Doppler Indications:     Elevated troponin  History:         Patient has no prior history of Echocardiogram examinations.                  COPD; Risk Factors:Diabetes.  Sonographer:     Sherrie Sport RDCS (AE) Referring Phys:  Baker Janus Soledad Gerlach NIU Diagnosing Phys: Bartholome Bill MD  Sonographer Comments: Technically challenging study due to limited acoustic windows and no apical window. Image acquisition challenging due to COPD. IMPRESSIONS  1. Left ventricular ejection fraction, by estimation, is 30 to 35%. The left ventricle has moderate to severely decreased function. The left ventricle demonstrates global hypokinesis. The left ventricular internal cavity size was mildly dilated. Left ventricular diastolic parameters were normal.  2. Right ventricular systolic function is normal. The right ventricular size is normal.  3. Left atrial size was mildly dilated.  4. The mitral valve is grossly normal. Trivial mitral valve regurgitation.  5. The aortic valve was not well visualized. Aortic valve regurgitation is trivial. FINDINGS  Left Ventricle: Left ventricular ejection fraction, by estimation, is 30 to 35%. The left ventricle has moderate to severely decreased function. The left ventricle demonstrates global hypokinesis. The left ventricular internal cavity size  was mildly dilated. There is borderline left ventricular hypertrophy. Left ventricular diastolic parameters were normal. Right Ventricle: The right ventricular size is normal. No increase in right ventricular wall  thickness. Right ventricular systolic function is normal. Left Atrium: Left atrial size was mildly dilated. Right Atrium: Right atrial size was normal in size. Pericardium: There is no evidence of pericardial effusion. Mitral Valve: The mitral valve is grossly normal. Trivial mitral valve regurgitation. Tricuspid Valve: The tricuspid valve is not well visualized. Tricuspid valve regurgitation is trivial. Aortic Valve: The aortic valve was not well visualized. Aortic valve regurgitation is trivial. Pulmonic Valve: The pulmonic valve was not well visualized. Pulmonic valve regurgitation is trivial. Aorta: The aortic root is normal in size and structure and the aortic root was not well visualized. IAS/Shunts: The interatrial septum was not assessed.  LEFT VENTRICLE PLAX 2D LVIDd:         5.37 cm LVIDs:         4.77 cm LV PW:         1.25 cm LV IVS:        1.00 cm LVOT diam:     2.10 cm LVOT Area:     3.46 cm  LEFT ATRIUM         Index LA diam:    3.40 cm 1.81 cm/m                        PULMONIC VALVE AORTA                 PV Vmax:        0.55 m/s Ao Root diam: 2.90 cm PV Peak grad:   1.2 mmHg                       RVOT Peak grad: 2 mmHg  TRICUSPID VALVE TR Peak grad:   5.2 mmHg TR Vmax:        114.00 cm/s  SHUNTS Systemic Diam: 2.10 cm Bartholome Bill MD Electronically signed by Bartholome Bill MD Signature Date/Time: 08/01/2020/11:52:33 AM    Final         Scheduled Meds: . vitamin C  500 mg Oral Daily  . aspirin EC  81 mg Oral Daily  . atorvastatin  40 mg Oral Daily  . azithromycin  250 mg Oral Daily  . baricitinib  4 mg Oral Daily  . calcium-vitamin D  1 tablet Oral Q breakfast  . diltiazem  120 mg Oral Daily  . fluticasone furoate-vilanterol  1 puff Inhalation Daily   And  . umeclidinium bromide  1 puff Inhalation Daily  . insulin aspart  0-5 Units Subcutaneous QHS  . insulin aspart  0-9 Units Subcutaneous TID WC  . ipratropium  2 puff Inhalation Q4H  . mouth rinse  15 mL Mouth Rinse BID  .  methylPREDNISolone (SOLU-MEDROL) injection  40 mg Intravenous Q12H  . zinc sulfate  220 mg Oral Daily   Continuous Infusions: . heparin 1,300 Units/hr (08/02/20 0808)  . remdesivir 100 mg in NS 100 mL 100 mg (08/02/20 0811)     LOS: 2 days    Time spent: 25 minutes    Sidney Ace, MD [facility] Pager [phone]  If 7PM-7AM, please contact night-coverage 08/02/2020, 10:31 AM

## 2020-08-02 NOTE — Consult Note (Signed)
ANTICOAGULATION CONSULT NOTE  Pharmacy Consult for Heparin Drip Indication: chest pain/ACS/STEMI  Allergies  Allergen Reactions  . Metoprolol     Pt states syncope with lopressor   Patient Measurements: Height: 5\' 9"  (175.3 cm) Weight: 72.6 kg (160 lb) IBW/kg (Calculated) : 70.7 Heparin Dosing Weight: 72.6kg  Vital Signs: Temp: 97.8 F (36.6 C) (12/07 0421) Temp Source: Oral (12/06 2300) BP: 107/70 (12/07 0421) Pulse Rate: 73 (12/07 0421)  Labs: Recent Labs    07/31/20 0832 07/31/20 0832 07/31/20 1040 07/31/20 1716 07/31/20 2349 08/01/20 1057 08/01/20 1330 08/01/20 1527 08/01/20 2210 08/02/20 0653  HGB 14.0   < >  --   --   --   --  12.0*  --   --  12.4*  HCT 43.1  --   --   --   --   --  37.6*  --   --  38.2*  PLT 255  --   --   --   --   --  213  --   --  207  APTT 26  --   --   --   --   --   --   --   --   --   LABPROT 13.6  --   --   --   --   --   --   --   --   --   INR 1.1  --   --   --   --   --   --   --   --   --   HEPARINUNFRC  --   --   --   --    < > 0.25*  --   --  0.37 0.65  CREATININE 1.00  --   --   --   --   --  1.35*  --   --  1.22  TROPONINIHS 30*  --    < > 605*  --   --  603* 578*  --   --    < > = values in this interval not displayed.    Estimated Creatinine Clearance: 42.7 mL/min (by C-G formula based on SCr of 1.22 mg/dL).  Medical History: Past Medical History:  Diagnosis Date  . Asthma   . Bronchiectasis (HCC)   . COPD (chronic obstructive pulmonary disease) (HCC)   . Diabetes mellitus    Medications:  No PTA anticoagulant of record  Assessment: 86 y.o. male with medical history significant of COPD, diabetes mellitus, asthma, bronchiectasis, presenting with shortness breath, fever, COVID, and troponins trending up.  Pharmacy has been consulted to initiate and monitor a heparin drip.  Goal of Therapy:  Heparin level 0.3-0.7 units/ml Monitor platelets by anticoagulation protocol: Yes   Plan:  12/7 0653 HL 0.65  therapeutic x 2. Continue Heparin at current rate of 1300 units/hr. HL and CBC daily while on heparin drip.   14/7, PharmD Clinical Pharmacist 08/02/2020 7:56 AM

## 2020-08-02 NOTE — Consult Note (Signed)
ANTICOAGULATION CONSULT NOTE  Pharmacy Consult for Heparin Drip Indication: chest pain/ACS/STEMI  Allergies  Allergen Reactions  . Metoprolol     Pt states syncope with lopressor   Patient Measurements: Height: 5\' 9"  (175.3 cm) Weight: 72.6 kg (160 lb) IBW/kg (Calculated) : 70.7 Heparin Dosing Weight: 72.6kg  Vital Signs: Temp: 98.1 F (36.7 C) (12/06 2300) Temp Source: Oral (12/06 2300) BP: 98/61 (12/06 2300) Pulse Rate: 70 (12/06 2300)  Labs: Recent Labs    07/31/20 0832 07/31/20 1040 07/31/20 1716 07/31/20 2349 08/01/20 1057 08/01/20 1330 08/01/20 1527 08/01/20 2210  HGB 14.0  --   --   --   --  12.0*  --   --   HCT 43.1  --   --   --   --  37.6*  --   --   PLT 255  --   --   --   --  213  --   --   APTT 26  --   --   --   --   --   --   --   LABPROT 13.6  --   --   --   --   --   --   --   INR 1.1  --   --   --   --   --   --   --   HEPARINUNFRC  --   --   --  <0.10* 0.25*  --   --  0.37  CREATININE 1.00  --   --   --   --  1.35*  --   --   TROPONINIHS 30*   < > 605*  --   --  603* 578*  --    < > = values in this interval not displayed.    Estimated Creatinine Clearance: 38.6 mL/min (A) (by C-G formula based on SCr of 1.35 mg/dL (H)).  Medical History: Past Medical History:  Diagnosis Date  . Asthma   . Bronchiectasis (HCC)   . COPD (chronic obstructive pulmonary disease) (HCC)   . Diabetes mellitus    Medications:  No PTA anticoagulant of record  Assessment: 86 y.o. male with medical history significant of COPD, diabetes mellitus, asthma, bronchiectasis, presenting with shortness breath, fever, COVID, and troponins trending up.  Pharmacy has been consulted to initiate and monitor a heparin drip.  Goal of Therapy:  Heparin level 0.3-0.7 units/ml Monitor platelets by anticoagulation protocol: Yes   Plan:  12/6 2210 HL 0.37 therapeutic x 1.  Continue Heparin at current rate of 1300 units/hr. Recheck HL in ~ 8 hours to confirm. CBC daily while on  heparin drip.   14/6, PharmD Clinical Pharmacist 08/02/2020 12:19 AM

## 2020-08-02 NOTE — Evaluation (Signed)
Occupational Therapy Evaluation Patient Details Name: George Hensley MRN: [id number] DOB: 1931-06-25 Today's Date: 08/02/2020    History of Present Illness 86 y.o. male with medical history significant of COPD (on 2 L oxygen sometimes), diabetes mellitus, asthma, bronchiectasis, who presents with shortness breath and fever. Found to be positive for Covid-19.   Clinical Impression   Pt was seen for OT evaluation this date. Prior to hospital admission, pt was active and independent, going to the gym 3x/wk. Pt lives with his spouse in a level entry 1 story home. No DME use at baseline, no falls. Currently pt demonstrates mild impairments in activity tolerance but denies SOB, pain, or difficulty performing ADL mobility in room with RW and LB dressing EOB. Pt near baseline independence. Supervision for ADL transfers with RW, cues for technique. Educated in activity pacing and energy conservation principles to return to prior level of activity. Do not anticipate need for additional skilled OT needs. Will sign off.     Follow Up Recommendations  No OT follow up    Equipment Recommendations  None recommended by OT    Recommendations for Other Services       Precautions / Restrictions Precautions Precautions: Fall Restrictions Weight Bearing Restrictions: No      Mobility Bed Mobility Overal bed mobility: Modified Independent                  Transfers Overall transfer level: Needs assistance Equipment used: Rolling walker (2 wheeled) Transfers: Sit to/from Stand Sit to Stand: Supervision         General transfer comment: cues for hand placement with noted improvement in ease of transfer    Balance Overall balance assessment: No apparent balance deficits (not formally assessed)                                         ADL either performed or assessed with clinical judgement   ADL Overall ADL's : Independent                                              Vision Patient Visual Report: No change from baseline       Perception     Praxis      Pertinent Vitals/Pain Pain Assessment: No/denies pain     Hand Dominance Right   Extremity/Trunk Assessment Upper Extremity Assessment Upper Extremity Assessment: Overall WFL for tasks assessed   Lower Extremity Assessment Lower Extremity Assessment: Overall WFL for tasks assessed   Cervical / Trunk Assessment Cervical / Trunk Assessment: Normal   Communication Communication Communication: No difficulties   Cognition Arousal/Alertness: Awake/alert Behavior During Therapy: WFL for tasks assessed/performed Overall Cognitive Status: Within Functional Limits for tasks assessed                                     General Comments       Exercises Other Exercises Other Exercises: Pt instructed in energy conservation principles for ADL and IADL tasks   Shoulder Instructions      Home Living Family/patient expects to be discharged to:: Private residence Living Arrangements: Spouse/significant other Available Help at Discharge: Family Type of Home: House Home Access: Level entry  Home Layout: One level     Bathroom Shower/Tub: Producer, television/film/video: Handicapped height     Home Equipment: Environmental consultant - 2 wheels   Additional Comments: 2ww is his wife's and she uses it for toileting over night      Prior Functioning/Environment Level of Independence: Independent        Comments: indep, goes to gym 3x/wk, no falls, indep with ADL, drives,        OT Problem List: Decreased activity tolerance      OT Treatment/Interventions:      OT Goals(Current goals can be found in the care plan section) Acute Rehab OT Goals Patient Stated Goal: go home and get back to the gym OT Goal Formulation: All assessment and education complete, DC therapy  OT Frequency:     Barriers to D/C:            Co-evaluation               AM-PAC OT "6 Clicks" Daily Activity     Outcome Measure Help from another person eating meals?: None Help from another person taking care of personal grooming?: None Help from another person toileting, which includes using toliet, bedpan, or urinal?: None Help from another person bathing (including washing, rinsing, drying)?: A Little Help from another person to put on and taking off regular upper body clothing?: None Help from another person to put on and taking off regular lower body clothing?: None 6 Click Score: 23   End of Session Equipment Utilized During Treatment: Gait belt;Rolling walker Nurse Communication: Mobility status  Activity Tolerance: Patient tolerated treatment well Patient left: in chair;with call bell/phone within reach;with chair alarm set  OT Visit Diagnosis: Other abnormalities of gait and mobility (R26.89)                Time: 4008-6761 OT Time Calculation (min): 27 min Charges:  OT General Charges $OT Visit: 1 Visit OT Evaluation $OT Eval Low Complexity: 1 Low OT Treatments $Self Care/Home Management : 8-22 mins  Richrd Prime, MPH, MS, OTR/L ascom [phone] 08/02/20, 4:35 PM

## 2020-08-02 NOTE — Progress Notes (Addendum)
[facility]    SUBJECTIVE: Patient states to be doing reasonably well no shortness of breath no dyspnea no chest pain feels well has not ambulated much no sputum production no cough no palpitations or tachycardia   Vitals:   08/01/20 1950 08/01/20 2300 08/02/20 0421 08/02/20 0757  BP: 102/65 98/61 107/70 123/74  Pulse: 70 70 73 88  Resp: 18 18 17 18   Temp: 98.2 F (36.8 C) 98.1 F (36.7 C) 97.8 F (36.6 C) 98.5 F (36.9 C)  TempSrc: Oral Oral    SpO2: 96% 96% 94% 94%  Weight:      Height:         Intake/Output Summary (Last 24 hours) at 08/02/2020 1023 Last data filed at 08/01/2020 1520 Gross per 24 hour  Intake 275.89 ml  Output 400 ml  Net -124.11 ml      PHYSICAL EXAM  General: Well developed, well nourished, in no acute distress HEENT:  Normocephalic and atramatic Neck:  No JVD.  Lungs: Diffuse rhonchi bilaterally to auscultation and percussion. Heart: HRRR . Normal S1 and S2 without gallops or murmurs.  Abdomen: Bowel sounds are positive, abdomen soft and non-tender  Msk:  Back normal, normal gait. Normal strength and tone for age. Extremities: No clubbing, cyanosis or edema.   Neuro: Alert and oriented X 3. Psych:  Good affect, responds appropriately   LABS: Basic Metabolic Panel: Recent Labs    07/31/20 0832 07/31/20 0832 08/01/20 1330 08/02/20 0653  NA 139   < > 137 135  K 3.4*   < > 4.5 4.6  CL 112*   < > 107 104  CO2 19*   < > 24 23  GLUCOSE 136*   < > 175* 181*  BUN 19   < > 41* 45*  CREATININE 1.00   < > 1.35* 1.22  CALCIUM 6.8*   < > 8.3* 8.2*  MG 1.6*   < > 2.5* 2.4  PHOS 2.7  --   --   --    < > = values in this interval not displayed.   Liver Function Tests: Recent Labs    08/01/20 1330 08/02/20 0653  AST 23 20  ALT 12 14  ALKPHOS 56 57  BILITOT 0.8 1.2  PROT 6.2* 6.2*  ALBUMIN 3.0* 3.0*   No results for input(s): LIPASE, AMYLASE in the last 72 hours. CBC: Recent Labs    08/01/20 1330 08/02/20 0653  WBC 18.9* 13.1*   NEUTROABS 16.4* 12.0*  HGB 12.0* 12.4*  HCT 37.6* 38.2*  MCV 96.2 95.5  PLT 213 207   Cardiac Enzymes: No results for input(s): CKTOTAL, CKMB, CKMBINDEX, TROPONINI in the last 72 hours. BNP: Invalid input(s): POCBNP D-Dimer: No results for input(s): DDIMER in the last 72 hours. Hemoglobin A1C: Recent Labs    07/31/20 1040  HGBA1C 7.7*   Fasting Lipid Panel: Recent Labs    08/01/20 1330  CHOL 113  HDL 61  LDLCALC 48  TRIG 21  CHOLHDL 1.9   Thyroid Function Tests: No results for input(s): TSH, T4TOTAL, T3FREE, THYROIDAB in the last 72 hours.  Invalid input(s): FREET3 Anemia Panel: Recent Labs    08/01/20 1330  FERRITIN 100    ECHOCARDIOGRAM COMPLETE  Result Date: 08/01/2020    ECHOCARDIOGRAM REPORT   Patient Name:   George Hensley Date of Exam: 08/01/2020 Medical Rec #:  14/01/2020       Height:       69.0 in Accession #:    [id number]  Weight:       160.0 lb Date of Birth:  11/28/1930      BSA:          1.879 m Patient Age:    86 years        BP:           96/62 mmHg Patient Gender: M               HR:           78 bpm. Exam Location:  [facility] Procedure: 2D Echo, Cardiac Doppler and Color Doppler Indications:     Elevated troponin  History:         Patient has no prior history of Echocardiogram examinations.                  COPD; Risk Factors:Diabetes.  Sonographer:     Cristela Blue RDCS (AE) Referring Phys:  Kern Reap Brien Few NIU Diagnosing Phys: Harold Hedge MD  Sonographer Comments: Technically challenging study due to limited acoustic windows and no apical window. Image acquisition challenging due to COPD. IMPRESSIONS  1. Left ventricular ejection fraction, by estimation, is 30 to 35%. The left ventricle has moderate to severely decreased function. The left ventricle demonstrates global hypokinesis. The left ventricular internal cavity size was mildly dilated. Left ventricular diastolic parameters were normal.  2. Right ventricular systolic function is normal. The right  ventricular size is normal.  3. Left atrial size was mildly dilated.  4. The mitral valve is grossly normal. Trivial mitral valve regurgitation.  5. The aortic valve was not well visualized. Aortic valve regurgitation is trivial. FINDINGS  Left Ventricle: Left ventricular ejection fraction, by estimation, is 30 to 35%. The left ventricle has moderate to severely decreased function. The left ventricle demonstrates global hypokinesis. The left ventricular internal cavity size was mildly dilated. There is borderline left ventricular hypertrophy. Left ventricular diastolic parameters were normal. Right Ventricle: The right ventricular size is normal. No increase in right ventricular wall thickness. Right ventricular systolic function is normal. Left Atrium: Left atrial size was mildly dilated. Right Atrium: Right atrial size was normal in size. Pericardium: There is no evidence of pericardial effusion. Mitral Valve: The mitral valve is grossly normal. Trivial mitral valve regurgitation. Tricuspid Valve: The tricuspid valve is not well visualized. Tricuspid valve regurgitation is trivial. Aortic Valve: The aortic valve was not well visualized. Aortic valve regurgitation is trivial. Pulmonic Valve: The pulmonic valve was not well visualized. Pulmonic valve regurgitation is trivial. Aorta: The aortic root is normal in size and structure and the aortic root was not well visualized. IAS/Shunts: The interatrial septum was not assessed.  LEFT VENTRICLE PLAX 2D LVIDd:         5.37 cm LVIDs:         4.77 cm LV PW:         1.25 cm LV IVS:        1.00 cm LVOT diam:     2.10 cm LVOT Area:     3.46 cm  LEFT ATRIUM         Index LA diam:    3.40 cm 1.81 cm/m                        PULMONIC VALVE AORTA                 PV Vmax:        0.55 m/s Ao Root diam: 2.90 cm PV  Peak grad:   1.2 mmHg                       RVOT Peak grad: 2 mmHg  TRICUSPID VALVE TR Peak grad:   5.2 mmHg TR Vmax:        114.00 cm/s  SHUNTS Systemic Diam: 2.10 cm  Harold Hedge MD Electronically signed by Harold Hedge MD Signature Date/Time: 08/01/2020/11:52:33 AM    Final      Echo mildly depressed left ventricular function 30 to 35%  TELEMETRY: Sinus rhythm interventricular conduction delay 90  ASSESSMENT AND PLAN:  Principal Problem:   Pneumonia due to COVID-19 virus Active Problems:   Acute on chronic respiratory failure with hypoxia (HCC)   Asthma exacerbation   COPD exacerbation (HCC)   Diabetes mellitus without complication (HCC)   Sepsis (HCC)   Hypokalemia   Hypomagnesemia   Elevated troponin   Bronchiectasis (HCC)   NSTEMI (non-ST elevated myocardial infarction) (HCC) Moderate cardiomyopathy EF 30 to 35%  Plan Recommend supportive care for Covid Continue Zithromax therapy antibiotics for possible pneumonia Continue diabetes management and control Possible non-STEMI but rec recommend conservative management Inhalers support oxygen as necessary for COPD and pneumonia Recommend conservative management Continue diltiazem for rate control blood pressure since the patient is reportedly allergic to metoprolol.  We will leave evaluate to see if the patient is able to tolerate coronary Start losartan 25 mg daily for cardiomyopathy Do not recommend invasive strategy at this point will treat medically patient is not having any angina or chest pain    Alwyn Pea, MD 08/02/2020 10:23 AM

## 2020-08-03 LAB — CBC WITH DIFFERENTIAL/PLATELET
Abs Immature Granulocytes: 0.07 10*3/uL (ref 0.00–0.07)
Basophils Absolute: 0 10*3/uL (ref 0.0–0.1)
Basophils Relative: 0 %
Eosinophils Absolute: 0 10*3/uL (ref 0.0–0.5)
Eosinophils Relative: 0 %
HCT: 37.3 % — ABNORMAL LOW (ref 39.0–52.0)
Hemoglobin: 12.3 g/dL — ABNORMAL LOW (ref 13.0–17.0)
Immature Granulocytes: 1 %
Lymphocytes Relative: 6 %
Lymphs Abs: 0.6 10*3/uL — ABNORMAL LOW (ref 0.7–4.0)
MCH: 31.1 pg (ref 26.0–34.0)
MCHC: 33 g/dL (ref 30.0–36.0)
MCV: 94.2 fL (ref 80.0–100.0)
Monocytes Absolute: 0.3 10*3/uL (ref 0.1–1.0)
Monocytes Relative: 3 %
Neutro Abs: 9.9 10*3/uL — ABNORMAL HIGH (ref 1.7–7.7)
Neutrophils Relative %: 90 %
Platelets: 203 10*3/uL (ref 150–400)
RBC: 3.96 MIL/uL — ABNORMAL LOW (ref 4.22–5.81)
RDW: 13.9 % (ref 11.5–15.5)
WBC: 10.9 10*3/uL — ABNORMAL HIGH (ref 4.0–10.5)
nRBC: 0 % (ref 0.0–0.2)

## 2020-08-03 LAB — COMPREHENSIVE METABOLIC PANEL
ALT: 14 U/L (ref 0–44)
AST: 15 U/L (ref 15–41)
Albumin: 3 g/dL — ABNORMAL LOW (ref 3.5–5.0)
Alkaline Phosphatase: 54 U/L (ref 38–126)
Anion gap: 8 (ref 5–15)
BUN: 48 mg/dL — ABNORMAL HIGH (ref 8–23)
CO2: 22 mmol/L (ref 22–32)
Calcium: 8.2 mg/dL — ABNORMAL LOW (ref 8.9–10.3)
Chloride: 106 mmol/L (ref 98–111)
Creatinine, Ser: 1.21 mg/dL (ref 0.61–1.24)
GFR, Estimated: 58 mL/min — ABNORMAL LOW (ref >60)
Glucose, Bld: 224 mg/dL — ABNORMAL HIGH (ref 70–99)
Potassium: 4.5 mmol/L (ref 3.5–5.1)
Sodium: 136 mmol/L (ref 135–145)
Total Bilirubin: 1 mg/dL (ref 0.3–1.2)
Total Protein: 6.3 g/dL — ABNORMAL LOW (ref 6.5–8.1)

## 2020-08-03 LAB — C-REACTIVE PROTEIN: CRP: 5.5 mg/dL — ABNORMAL HIGH (ref <1.0)

## 2020-08-03 LAB — GLUCOSE, CAPILLARY
Glucose-Capillary: 235 mg/dL — ABNORMAL HIGH (ref 70–99)
Glucose-Capillary: 295 mg/dL — ABNORMAL HIGH (ref 70–99)
Glucose-Capillary: 208 mg/dL — ABNORMAL HIGH (ref 70–99)

## 2020-08-03 LAB — MAGNESIUM: Magnesium: 2.6 mg/dL — ABNORMAL HIGH (ref 1.7–2.4)

## 2020-08-03 MED ORDER — LOSARTAN POTASSIUM 25 MG PO TABS: 25.0000 mg | ORAL_TABLET | Freq: Every day | ORAL | 2 refills | Status: DC

## 2020-08-03 MED ORDER — DEXAMETHASONE 6 MG PO TABS: 6.0000 mg | ORAL_TABLET | Freq: Every day | ORAL | 0 refills | Status: AC

## 2020-08-03 MED ORDER — DEXAMETHASONE 4 MG PO TABS
6.0000 mg | ORAL_TABLET | Freq: Every day | ORAL | Status: DC
  Administered 2020-08-03: 18:00:00 6 mg via ORAL
  Filled 2020-08-03: qty 2

## 2020-08-03 MED ORDER — ASCORBIC ACID 500 MG PO TABS: 500.0000 mg | ORAL_TABLET | Freq: Every day | ORAL | 0 refills | Status: AC

## 2020-08-03 MED ORDER — ZINC SULFATE 220 (50 ZN) MG PO CAPS: 220.0000 mg | ORAL_CAPSULE | Freq: Every day | ORAL | 0 refills | Status: AC

## 2020-08-03 MED ORDER — ATORVASTATIN CALCIUM 40 MG PO TABS: 40.0000 mg | ORAL_TABLET | Freq: Every day | ORAL | 2 refills | Status: DC

## 2020-08-03 NOTE — Care Management Important Message (Signed)
Important Message  Patient Details  Name: George Hensley MRN: [id number] Date of Birth: 1931/01/23   Medicare Important Message Given:  Yes     Allayne Butcher, RN 08/03/2020, 11:30 AM

## 2020-08-03 NOTE — Progress Notes (Signed)
SATURATION QUALIFICATIONS: (This note is used to comply with regulatory documentation for home oxygen)  Patient Saturations on Room Air at Rest = 91%  Patient Saturations on Room Air while Ambulating = 94%  Patient Saturations on Liters of oxygen while Ambulating =  Please briefly explain why patient needs home oxygen:

## 2020-08-03 NOTE — Progress Notes (Signed)
Inpatient Diabetes Program Recommendations  AACE/ADA: New Consensus Statement on Inpatient Glycemic Control (2015)  Target Ranges:  Prepandial:   less than 140 mg/dL      Peak postprandial:   less than 180 mg/dL (1-2 hours)      Critically ill patients:  140 - 180 mg/dL   Lab Results  Component Value Date   GLUCAP 235 (H) 08/03/2020   HGBA1C 7.7 (H) 07/31/2020    Review of Glycemic Control Results for TYRICE, HEWITT (MRN [id number]) as of 08/03/2020 11:12  Ref. Range 08/02/2020 11:55 08/02/2020 15:59 08/02/2020 21:03 08/03/2020 08:41  Glucose-Capillary Latest Ref Range: 70 - 99 mg/dL 945 (H) 038 (H) 882 (H) 235 (H)   Diabetes history: DM 2 Outpatient Diabetes medications: Jardiance 25 mg daily, Amaryl 4 mg bid Current orders for Inpatient glycemic control:  Novolog sensitive tid with meals and HS Solumedrol 40 mg IV q 12 hours Tradjenta 5 mg daily  Inpatient Diabetes Program Recommendations:    Please consider increasing Novolog correction to moderate tid with meals and HS.  Thanks,  Beryl Meager, RN, BC-ADM Inpatient Diabetes Coordinator Pager [phone] (8a-5p)

## 2020-08-03 NOTE — Evaluation (Signed)
Physical Therapy Evaluation Patient Details Name: George Hensley MRN: [id number] DOB: [birth year] Today's Date: 08/03/2020   History of Present Illness  86 y.o. male with medical history significant of COPD (on 2 L oxygen sometimes), diabetes mellitus, asthma, bronchiectasis, who presents with shortness breath and fever. Found to be positive for Covid-19.  Clinical Impression  Pt did very well with PT exam, he showed good U&LE strength, displayed safe balance for return to home and though he did have some minimal fatigue with ~75 ft of ambulation (on room air, no AD) he ultimately did well. He feels good about being able to return home with assist from wife, realizes that he may not be ready/able to go right back to the gym but after some recovery and quarantine is eager to do so. Pt does not need further PT f/u.    Follow Up Recommendations No PT follow up    Equipment Recommendations  None recommended by PT    Recommendations for Other Services       Precautions / Restrictions Precautions Precautions: Fall Restrictions Weight Bearing Restrictions: No      Mobility  Bed Mobility Overal bed mobility: Modified Independent             General bed mobility comments: easily gets up to EOB    Transfers Overall transfer level: Modified independent Equipment used: None Transfers: Sit to/from Stand Sit to Stand: Supervision         General transfer comment: able to rise w/o assist, good confidence  Ambulation/Gait Ambulation/Gait assistance: Supervision Gait Distance (Feet): 75 Feet Assistive device: None       General Gait Details: multiple loops in room on room air with sats remaining in the 90s (HR in the 70-90s range) with no shortness of breath or subjective fatigue.  No LOBs or stagger stepping, good overall tolerance though close to baseline he does acknowledge that he is weaker and quicker to tire than his normal  Stairs            Wheelchair  Mobility    Modified Rankin (Stroke Patients Only)       Balance Overall balance assessment: No apparent balance deficits (not formally assessed)                                           Pertinent Vitals/Pain Pain Assessment: No/denies pain    Home Living Family/patient expects to be discharged to:: Private residence Living Arrangements: Spouse/significant other Available Help at Discharge: Family Type of Home: House Home Access: Level entry     Home Layout: One level Home Equipment: Environmental consultant - 2 wheels      Prior Function Level of Independence: Independent         Comments: indep, goes to gym 3x/wk, no falls, indep with ADL, drives,     Hand Dominance        Extremity/Trunk Assessment   Upper Extremity Assessment Upper Extremity Assessment: Generalized weakness    Lower Extremity Assessment Lower Extremity Assessment: Generalized weakness       Communication   Communication: No difficulties  Cognition Arousal/Alertness: Awake/alert Behavior During Therapy: WFL for tasks assessed/performed Overall Cognitive Status: Within Functional Limits for tasks assessed  General Comments General comments (skin integrity, edema, etc.): Pt with good balance, safety and confidence with multiple in-room loops    Exercises     Assessment/Plan    PT Assessment Patent does not need any further PT services  PT Problem List         PT Treatment Interventions      PT Goals (Current goals can be found in the Care Plan section)  Acute Rehab PT Goals Patient Stated Goal: go home and get back to the gym PT Goal Formulation: With patient Time For Goal Achievement: 08/17/20 Potential to Achieve Goals: Good    Frequency     Barriers to discharge        Co-evaluation               AM-PAC PT "6 Clicks" Mobility  Outcome Measure Help needed turning from your back to your side while  in a flat bed without using bedrails?: None Help needed moving from lying on your back to sitting on the side of a flat bed without using bedrails?: None Help needed moving to and from a bed to a chair (including a wheelchair)?: None Help needed standing up from a chair using your arms (e.g., wheelchair or bedside chair)?: None Help needed to walk in hospital room?: None Help needed climbing 3-5 steps with a railing? : A Little 6 Click Score: 23    End of Session Equipment Utilized During Treatment: Gait belt Activity Tolerance: Patient tolerated treatment well Patient left: with call bell/phone within reach;with bed alarm set Nurse Communication: Mobility status PT Visit Diagnosis: Muscle weakness (generalized) (M62.81);Difficulty in walking, not elsewhere classified (R26.2)    Time: 5009-3818 PT Time Calculation (min) (ACUTE ONLY): 19 min   Charges:   PT Evaluation $PT Eval Low Complexity: 1 Low          Malachi Pro, DPT 08/03/2020, 3:24 PM

## 2020-08-03 NOTE — Discharge Summary (Signed)
Physician Discharge Summary   George Hensley  male DOB: 12-09-1930  ZOX:[id number]  PCP: Danella Penton, MD  Admit date: 07/31/2020 Discharge date: 08/03/2020  Admitted From: home Disposition:  Home.  Wife updated on the phone on the day of discharge about discharge plans.  Home Health: No  PT evaluated and pt has no needs. CODE STATUS: Limited code   Hospital Course:  For full details, please see H&P, progress notes, consult notes and ancillary notes.  Briefly,   George Hensley is a 86 y.o.malewith medical history significant ofCOPD (on 2 L oxygen PRN), diabetes mellitus, asthma, bronchiectasis, who presented with shortness breath and fever.  Patient also has cough with yellow-colored mucus productionper his wife. No chest pain. Patient has chills and fever with body temperature 103.1 at home. Histemperature was 104 initially ED. He has nausea, no vomiting, diarrhea or abdominal pain.  Pt was found to have positive Covid PCR.  Also found to have elevated troponin concerning for NSTEMI.  Cardiology was consulted.  Patient was started on a heparin drip per ACS protocol.   Sepsis ruled out SIRS criteria due to having COVID infection.  Procal low.  No evidence of other bacterial infection.  Multifocal pneumonia due to COVID-19 Acute on chronic hypoxic respiratory failure secondary to above COPD exacerbation  Chest x-ray demonstrating patchy infiltrate left lower lobe.  Evidence of emphysema on imaging.  Pt received 4 doses of Remdesivir and was already weaned down to room air by 12/7.   CRP high was 12.1, and pt received solumedrol 40 mg q12h until discharge when steroid was switched to oral decadron for 5 more days.  Pt received bronchodilators while inpatient and was discharged back to home Trelegy.  Pt also received azithromycin while inpatient.  History of bronchiectasis Pt received azithromycin while inpatient and discharged back on home Bactrim (for ppx, per  wife).  Heart failure with reduced ejection fraction 2D echocardiogram noted reduced ejection fraction of 30 to 35%.  This appears to be a new dx as pt last saw cardiology Dr. Gwen Pounds in June 2021 and the note mentioned no CHF.  No signs of acute exacerbation on presentation.  Cardiology consulted and recommended continuing home diltiazem for rate control blood pressure since the patient is reportedly allergic to metoprolol.  Pt was started on losartan 25 mg daily for cardiomyopathy, however, couldn't give due to low BP.  Cardiology did not recommend invasive strategy at this point since patient was not having any angina or chest pain.  Pt will continue followup with his outpatient cardiologist.  Hx of CAD with remote hx of stenting Continued home ASA 81.  Per outpatient cardiologist, statin therapy is deferred due to pt's age and previously voiced concerns for side effects.  LDL 48, well controlled.  Elevated troponin Possible NSTEMI No ischemic changes on EKG.  No chest pain.  Cardiology consulted from admission.  Troponins peaked at 600.  Pt was on IV heparin for 48 hours.  Continued home ASA 81.  No beta-blocker due to allergy.  No losartan due to low BP.  Medical management for now.  Diabetes mellitus without complication Recent A1c 9.8.  Indicate poor control.  Oral agents held during hospitalization and pt received SSI.  Pt discharged back on home Jardiance and Amaryl.  Hypokalemia Hypomagnesemia Repleted PRN.   Discharge Diagnoses:  Principal Problem:   Pneumonia due to COVID-19 virus Active Problems:   Acute on chronic respiratory failure with hypoxia (HCC)   Asthma exacerbation  COPD exacerbation (HCC)   Diabetes mellitus without complication (HCC)   Sepsis (HCC)   Hypokalemia   Hypomagnesemia   Elevated troponin   Bronchiectasis (HCC)   NSTEMI (non-ST elevated myocardial infarction) [facility](HCC)    Discharge Instructions:  Allergies as of 08/03/2020      Reactions    Metoprolol    Pt states syncope with lopressor      Medication List    TAKE these medications   ascorbic acid 500 MG tablet Commonly known as: VITAMIN C Take 1 tablet (500 mg total) by mouth daily. Start taking on: August 04, 2020   aspirin EC 81 MG tablet Take 81 mg by mouth daily.   dexamethasone 6 MG tablet Commonly known as: DECADRON Take 1 tablet (6 mg total) by mouth daily for 5 days. Start taking on: August 04, 2020   diltiazem 120 MG 24 hr capsule Commonly known as: DILACOR XR Take 120 mg by mouth daily.   glimepiride 4 MG tablet Commonly known as: AMARYL Take 4 mg by mouth 2 (two) times daily.   hydrOXYzine 25 MG tablet Commonly known as: ATARAX/VISTARIL Take 25 mg by mouth 2 (two) times daily as needed for itching or sleep.   Jardiance 25 MG Tabs tablet Generic drug: empagliflozin Take 25 mg by mouth daily.   sulfamethoxazole-trimethoprim 400-80 MG tablet Commonly known as: BACTRIM Take 1 tablet by mouth daily.   Trelegy Ellipta 100-62.5-25 MCG/INH Aepb Generic drug: Fluticasone-Umeclidin-Vilant Inhale 1 puff into the lungs daily.   Ventolin HFA 108 (90 Base) MCG/ACT inhaler Generic drug: albuterol Inhale 1-2 puffs into the lungs every 6 (six) hours as needed for wheezing or shortness of breath.   zinc sulfate 220 (50 Zn) MG capsule Take 1 capsule (220 mg total) by mouth daily. Start taking on: August 04, 2020        Follow-up Information    Danella PentonMiller, Mark F, MD. Schedule an appointment as soon as possible for a visit in 1 week.   Specialty: Internal Medicine Why: patient to make own follow up appointment; office closed Contact information: [address] [place] [address] [phone]               Allergies  Allergen Reactions  . Metoprolol     Pt states syncope with lopressor     The results of significant diagnostics from this hospitalization (including imaging, microbiology, ancillary and laboratory) are listed  below for reference.   Consultations:   Procedures/Studies: DG Chest Port 1 View  Result Date: 07/31/2020 CLINICAL DATA:  Short of breath.  Questionable sepsis.  COPD. EXAM: PORTABLE CHEST 1 VIEW COMPARISON:  08/08/2018. FINDINGS: Lungs are hyperexpanded. The cardiopericardial silhouette is within normal limits for size. Interstitial markings are diffusely coarsened with chronic features. Patchy airspace disease is seen in the left lung, most prominently in the left upper lobe where it has a somewhat nodular/masslike character. The visualized bony structures of the thorax show no acute abnormality. Telemetry leads overlie the chest. IMPRESSION: Emphysema with patchy airspace disease in the left lung, most prominently in the left upper lobe. Features suggest pneumonia. Given the relatively focal appearance in the left upper lung, follow-up imaging recommended to ensure resolution. Electronically Signed   By: Kennith CenterEric  Mansell M.D.   On: 07/31/2020 09:35   ECHOCARDIOGRAM COMPLETE  Result Date: 08/01/2020    ECHOCARDIOGRAM REPORT   Patient Name:   George Hensley Date of Exam: 08/01/2020 Medical Rec #:  [id number]  Height:       69.0 in Accession #:    [id number]      Weight:       160.0 lb Date of Birth:  [birth year]      BSA:          1.879 m Patient Age:    86 years        BP:           96/62 mmHg Patient Gender: M               HR:           78 bpm. Exam Location:  [facility] Procedure: 2D Echo, Cardiac Doppler and Color Doppler Indications:     Elevated troponin  History:         Patient has no prior history of Echocardiogram examinations.                  COPD; Risk Factors:Diabetes.  Sonographer:     Cristela Blue RDCS (AE) Referring Phys:  Kern Reap Brien Few NIU Diagnosing Phys: Harold Hedge MD  Sonographer Comments: Technically challenging study due to limited acoustic windows and no apical window. Image acquisition challenging due to COPD. IMPRESSIONS  1. Left ventricular ejection fraction, by estimation, is 30 to  35%. The left ventricle has moderate to severely decreased function. The left ventricle demonstrates global hypokinesis. The left ventricular internal cavity size was mildly dilated. Left ventricular diastolic parameters were normal.  2. Right ventricular systolic function is normal. The right ventricular size is normal.  3. Left atrial size was mildly dilated.  4. The mitral valve is grossly normal. Trivial mitral valve regurgitation.  5. The aortic valve was not well visualized. Aortic valve regurgitation is trivial. FINDINGS  Left Ventricle: Left ventricular ejection fraction, by estimation, is 30 to 35%. The left ventricle has moderate to severely decreased function. The left ventricle demonstrates global hypokinesis. The left ventricular internal cavity size was mildly dilated. There is borderline left ventricular hypertrophy. Left ventricular diastolic parameters were normal. Right Ventricle: The right ventricular size is normal. No increase in right ventricular wall thickness. Right ventricular systolic function is normal. Left Atrium: Left atrial size was mildly dilated. Right Atrium: Right atrial size was normal in size. Pericardium: There is no evidence of pericardial effusion. Mitral Valve: The mitral valve is grossly normal. Trivial mitral valve regurgitation. Tricuspid Valve: The tricuspid valve is not well visualized. Tricuspid valve regurgitation is trivial. Aortic Valve: The aortic valve was not well visualized. Aortic valve regurgitation is trivial. Pulmonic Valve: The pulmonic valve was not well visualized. Pulmonic valve regurgitation is trivial. Aorta: The aortic root is normal in size and structure and the aortic root was not well visualized. IAS/Shunts: The interatrial septum was not assessed.  LEFT VENTRICLE PLAX 2D LVIDd:         5.37 cm LVIDs:         4.77 cm LV PW:         1.25 cm LV IVS:        1.00 cm LVOT diam:     2.10 cm LVOT Area:     3.46 cm  LEFT ATRIUM         Index LA diam:     3.40 cm 1.81 cm/m                        PULMONIC VALVE AORTA  PV Vmax:        0.55 m/s Ao Root diam: 2.90 cm PV Peak grad:   1.2 mmHg                       RVOT Peak grad: 2 mmHg  TRICUSPID VALVE TR Peak grad:   5.2 mmHg TR Vmax:        114.00 cm/s  SHUNTS Systemic Diam: 2.10 cm Harold Hedge MD Electronically signed by Harold Hedge MD Signature Date/Time: 08/01/2020/11:52:33 AM    Final       Labs: BNP (last 3 results) Recent Labs    07/31/20 0832  BNP 128.2*   Basic Metabolic Panel: Recent Labs  Lab 07/31/20 0832 08/01/20 1330 08/02/20 0653 08/03/20 0505  NA 139 137 135 136  K 3.4* 4.5 4.6 4.5  CL 112* 107 104 106  CO2 19* 24 23 22   GLUCOSE 136* 175* 181* 224*  BUN 19 41* 45* 48*  CREATININE 1.00 1.35* 1.22 1.21  CALCIUM 6.8* 8.3* 8.2* 8.2*  MG 1.6* 2.5* 2.4 2.6*  PHOS 2.7  --   --   --    Liver Function Tests: Recent Labs  Lab 07/31/20 0832 08/01/20 1330 08/02/20 0653 08/03/20 0505  AST 14* 23 20 15   ALT 10 12 14 14   ALKPHOS 50 56 57 54  BILITOT 1.1 0.8 1.2 1.0  PROT 5.6* 6.2* 6.2* 6.3*  ALBUMIN 2.7* 3.0* 3.0* 3.0*   No results for input(s): LIPASE, AMYLASE in the last 168 hours. No results for input(s): AMMONIA in the last 168 hours. CBC: Recent Labs  Lab 07/31/20 0832 08/01/20 1330 08/02/20 0653 08/03/20 0505  WBC 9.2 18.9* 13.1* 10.9*  NEUTROABS 8.2* 16.4* 12.0* 9.9*  HGB 14.0 12.0* 12.4* 12.3*  HCT 43.1 37.6* 38.2* 37.3*  MCV 94.3 96.2 95.5 94.2  PLT 255 213 207 203   Cardiac Enzymes: No results for input(s): CKTOTAL, CKMB, CKMBINDEX, TROPONINI in the last 168 hours. BNP: Invalid input(s): POCBNP CBG: Recent Labs  Lab 08/02/20 1559 08/02/20 2103 08/03/20 0841 08/03/20 1131 08/03/20 1655  GLUCAP 237* 221* 235* 295* 208*   D-Dimer No results for input(s): DDIMER in the last 72 hours. Hgb A1c No results for input(s): HGBA1C in the last 72 hours. Lipid Profile Recent Labs    08/01/20 1330  CHOL 113  HDL 61  LDLCALC  48  TRIG 21  CHOLHDL 1.9   Thyroid function studies No results for input(s): TSH, T4TOTAL, T3FREE, THYROIDAB in the last 72 hours.  Invalid input(s): FREET3 Anemia work up Recent Labs    08/01/20 1330  FERRITIN 100   Urinalysis    Component Value Date/Time   COLORURINE YELLOW (A) 07/31/2020 0832   APPEARANCEUR CLEAR (A) 07/31/2020 0832   APPEARANCEUR Cloudy 05/16/2014 0851   LABSPEC 1.024 07/31/2020 0832   LABSPEC 1.020 05/16/2014 0851   PHURINE 5.0 07/31/2020 0832   GLUCOSEU >=500 (A) 07/31/2020 0832   GLUCOSEU 150 mg/dL 05/18/2014 14/12/2019   HGBUR SMALL (A) 07/31/2020 0832   BILIRUBINUR NEGATIVE 07/31/2020 0832   BILIRUBINUR Negative 05/16/2014 0851   KETONESUR NEGATIVE 07/31/2020 0832   PROTEINUR NEGATIVE 07/31/2020 0832   NITRITE NEGATIVE 07/31/2020 0832   LEUKOCYTESUR NEGATIVE 07/31/2020 0832   LEUKOCYTESUR 3+ 05/16/2014 0851   Sepsis Labs Invalid input(s): PROCALCITONIN,  WBC,  LACTICIDVEN Microbiology Recent Results (from the past 240 hour(s))  Resp Panel by RT-PCR (Flu A&B, Covid) Nasopharyngeal Swab     Status: Abnormal   Collection Time:  07/31/20  8:32 AM   Specimen: Nasopharyngeal Swab; Nasopharyngeal(NP) swabs in vial transport medium  Result Value Ref Range Status   SARS Coronavirus 2 by RT PCR POSITIVE (A) NEGATIVE Final    Comment: RESULT CALLED TO, READ BACK BY AND VERIFIED WITH: JENNIFER WHITTLEY 07/31/20 AT 1005 BY ACR (NOTE) SARS-CoV-2 target nucleic acids are DETECTED.  The SARS-CoV-2 RNA is generally detectable in upper respiratory specimens during the acute phase of infection. Positive results are indicative of the presence of the identified virus, but do not rule out bacterial infection or co-infection with other pathogens not detected by the test. Clinical correlation with patient history and other diagnostic information is necessary to determine patient infection status. The expected result is Negative.  Fact Sheet for  Patients: BloggerCourse.com  Fact Sheet for Healthcare Providers: SeriousBroker.it  This test is not yet approved or cleared by the [place] FDA and  has been authorized for detection and/or diagnosis of SARS-CoV-2 by FDA under an Emergency Use Authorization (EUA).  This EUA will remain in effect (meaning this test  can be used) for the duration of  the COVID-19 declaration under Section 564(b)(1) of the Act, 21 U.S.C. section 360bbb-3(b)(1), unless the authorization is terminated or revoked sooner.     Influenza A by PCR NEGATIVE NEGATIVE Final   Influenza B by PCR NEGATIVE NEGATIVE Final    Comment: (NOTE) The Xpert Xpress SARS-CoV-2/FLU/RSV plus assay is intended as an aid in the diagnosis of influenza from Nasopharyngeal swab specimens and should not be used as a sole basis for treatment. Nasal washings and aspirates are unacceptable for Xpert Xpress SARS-CoV-2/FLU/RSV testing.  Fact Sheet for Patients: BloggerCourse.com  Fact Sheet for Healthcare Providers: SeriousBroker.it  This test is not yet approved or cleared by the [place] FDA and has been authorized for detection and/or diagnosis of SARS-CoV-2 by FDA under an Emergency Use Authorization (EUA). This EUA will remain in effect (meaning this test can be used) for the duration of the COVID-19 declaration under Section 564(b)(1) of the Act, 21 U.S.C. section 360bbb-3(b)(1), unless the authorization is terminated or revoked.  Performed at [facility], [address] [place], [place] [address]   Blood Culture (routine x 2)     Status: None (Preliminary result)   Collection Time: 07/31/20  8:32 AM   Specimen: BLOOD  Result Value Ref Range Status   Specimen Description BLOOD LEFT FA  Final   Special Requests   Final    BOTTLES DRAWN AEROBIC AND ANAEROBIC Blood Culture adequate volume   Culture    Final    NO GROWTH 3 DAYS Performed at [facility], [address], [place], [place] [address]    Report Status PENDING  Incomplete  Blood Culture (routine x 2)     Status: None (Preliminary result)   Collection Time: 07/31/20  8:32 AM   Specimen: BLOOD  Result Value Ref Range Status   Specimen Description BLOOD LEFT HAND  Final   Special Requests   Final    BOTTLES DRAWN AEROBIC AND ANAEROBIC Blood Culture adequate volume   Culture   Final    NO GROWTH 3 DAYS Performed at [facility], [address], [place], [place] [address]    Report Status PENDING  Incomplete  Urine culture     Status: None   Collection Time: 07/31/20  8:32 AM   Specimen: In/Out Cath Urine  Result Value Ref Range Status   Specimen Description   Final  IN/OUT CATH URINE Performed at [facility], [address], [place], [place] [address]    Special Requests   Final    NONE Performed at [facility], [address] [place], [place] [address]    Culture   Final    NO GROWTH Performed at [facility], [address], [place], [place] [address]    Report Status 08/01/2020 FINAL  Final     Total time spend on discharging this patient, including the last patient exam, discussing the hospital stay, instructions for ongoing care as it relates to all pertinent caregivers, as well as preparing the medical discharge records, prescriptions, and/or referrals as applicable, is 45 minutes.    Darlin Priestly, MD  [facility] 08/03/2020, 7:10 PM

## 2020-08-05 LAB — CULTURE, BLOOD (ROUTINE X 2)
Culture: NO GROWTH
Culture: NO GROWTH
Special Requests: ADEQUATE
Special Requests: ADEQUATE

## 2020-08-06 ENCOUNTER — Emergency Department
Admission: EM | Admit: 2020-08-06 | Discharge: 2020-08-06 | Disposition: A | Discharge status: Home / Self Care | Payer: No Typology Code available for payment source | Attending: Emergency Medicine | Admitting: Emergency Medicine

## 2020-08-06 ENCOUNTER — Other Ambulatory Visit: Payer: Self-pay

## 2020-08-06 ENCOUNTER — Encounter: Payer: Self-pay | Admitting: Emergency Medicine

## 2020-08-06 ENCOUNTER — Emergency Department: Payer: No Typology Code available for payment source

## 2020-08-06 DIAGNOSIS — Z7982 Long term (current) use of aspirin: Secondary | ICD-10-CM | POA: Insufficient documentation

## 2020-08-06 DIAGNOSIS — Z955 Presence of coronary angioplasty implant and graft: Secondary | ICD-10-CM | POA: Insufficient documentation

## 2020-08-06 DIAGNOSIS — J45901 Unspecified asthma with (acute) exacerbation: Secondary | ICD-10-CM | POA: Diagnosis not present

## 2020-08-06 DIAGNOSIS — Z7951 Long term (current) use of inhaled steroids: Secondary | ICD-10-CM | POA: Insufficient documentation

## 2020-08-06 DIAGNOSIS — Z79899 Other long term (current) drug therapy: Secondary | ICD-10-CM | POA: Insufficient documentation

## 2020-08-06 DIAGNOSIS — U071 COVID-19: Secondary | ICD-10-CM | POA: Diagnosis not present

## 2020-08-06 DIAGNOSIS — N3000 Acute cystitis without hematuria: Secondary | ICD-10-CM | POA: Diagnosis not present

## 2020-08-06 DIAGNOSIS — E860 Dehydration: Secondary | ICD-10-CM | POA: Insufficient documentation

## 2020-08-06 DIAGNOSIS — Z7984 Long term (current) use of oral hypoglycemic drugs: Secondary | ICD-10-CM | POA: Insufficient documentation

## 2020-08-06 DIAGNOSIS — J441 Chronic obstructive pulmonary disease with (acute) exacerbation: Secondary | ICD-10-CM | POA: Diagnosis not present

## 2020-08-06 DIAGNOSIS — R531 Weakness: Secondary | ICD-10-CM | POA: Diagnosis present

## 2020-08-06 LAB — BASIC METABOLIC PANEL WITH GFR
Anion gap: 9 (ref 5–15)
BUN: 33 mg/dL — ABNORMAL HIGH (ref 8–23)
CO2: 27 mmol/L (ref 22–32)
Calcium: 8.2 mg/dL — ABNORMAL LOW (ref 8.9–10.3)
Chloride: 99 mmol/L (ref 98–111)
Creatinine, Ser: 1.1 mg/dL (ref 0.61–1.24)
GFR, Estimated: >60 mL/min
Glucose, Bld: 159 mg/dL — ABNORMAL HIGH (ref 70–99)
Potassium: 4.1 mmol/L (ref 3.5–5.1)
Sodium: 135 mmol/L (ref 135–145)

## 2020-08-06 LAB — URINALYSIS, COMPLETE (UACMP) WITH MICROSCOPIC
Bilirubin Urine: NEGATIVE
Glucose, UA: >=500 mg/dL — AB
Hgb urine dipstick: NEGATIVE
Ketones, ur: 5 mg/dL — AB
Nitrite: NEGATIVE
Protein, ur: 100 mg/dL — AB
Specific Gravity, Urine: 1.03 (ref 1.005–1.030)
pH: 6 (ref 5.0–8.0)

## 2020-08-06 LAB — HEPATIC FUNCTION PANEL
ALT: 18 U/L (ref 0–44)
AST: 17 U/L (ref 15–41)
Albumin: 3.4 g/dL — ABNORMAL LOW (ref 3.5–5.0)
Alkaline Phosphatase: 65 U/L (ref 38–126)
Bilirubin, Direct: 0.2 mg/dL (ref 0.0–0.2)
Indirect Bilirubin: 1.2 mg/dL — ABNORMAL HIGH (ref 0.3–0.9)
Total Bilirubin: 1.4 mg/dL — ABNORMAL HIGH (ref 0.3–1.2)
Total Protein: 7.2 g/dL (ref 6.5–8.1)

## 2020-08-06 LAB — CBC
HCT: 47.2 % (ref 39.0–52.0)
Hemoglobin: 15.9 g/dL (ref 13.0–17.0)
MCH: 31.2 pg (ref 26.0–34.0)
MCHC: 33.7 g/dL (ref 30.0–36.0)
MCV: 92.5 fL (ref 80.0–100.0)
Platelets: 239 10*3/uL (ref 150–400)
RBC: 5.1 MIL/uL (ref 4.22–5.81)
RDW: 13.6 % (ref 11.5–15.5)
WBC: 25.6 10*3/uL — ABNORMAL HIGH (ref 4.0–10.5)
nRBC: 0 % (ref 0.0–0.2)

## 2020-08-06 MED ORDER — SODIUM CHLORIDE 0.9 % IV SOLN
1.0000 g | Freq: Once | INTRAVENOUS | Status: AC
  Administered 2020-08-06: 1 g via INTRAVENOUS
  Filled 2020-08-06: qty 10

## 2020-08-06 MED ORDER — CEPHALEXIN 500 MG PO CAPS: 500.0000 mg | ORAL_CAPSULE | Freq: Four times a day (QID) | ORAL | 0 refills | Status: AC

## 2020-08-06 MED ORDER — LACTATED RINGERS IV BOLUS
500.0000 mL | Freq: Once | INTRAVENOUS | Status: AC
  Administered 2020-08-06: 500 mL via INTRAVENOUS

## 2020-08-06 NOTE — ED Triage Notes (Signed)
Pt in via EMS from home with c/o weakness and not able to sleep lastpm. Pt was covid + on 07/31/20, FSBS 205, 125/78, 96% RA

## 2020-08-06 NOTE — ED Provider Notes (Signed)
[facility] Emergency Department Provider Note  ____________________________________________   Event Date/Time   First MD Initiated Contact with Patient 08/06/20 1744     (approximate)  I have reviewed the triage vital signs and the nursing notes.   HISTORY  Chief Complaint Weakness   HPI George Hensley is a 86 y.o. male with a past medical history of asthma, bronchiectasis, COPD, DM, and recent COVID-19 diagnosis on 12/5 who presents via EMS from home after feeling little bit weak last night and not getting a good night sleep.  Patient denies any other acute symptoms and states he is otherwise been doing quite well over the last couple days and has not had any recent change in his chronic cough, any new shortness of breath, chest pain, fevers, chills, headache, earache, sore throat, abdominal pain, back pain, burning with urination, rash or extremity pain.  He denies any recent injuries or falls.  No clear loosening aggravating factors.  No personal episodes         Past Medical History:  Diagnosis Date  . Asthma   . Bronchiectasis (HCC)   . COPD (chronic obstructive pulmonary disease) (HCC)   . Diabetes mellitus     Patient Active Problem List   Diagnosis Date Noted  . Pneumonia due to COVID-19 virus 07/31/2020  . COPD exacerbation (HCC) 07/31/2020  . Diabetes mellitus without complication (HCC) 07/31/2020  . Sepsis (HCC) 07/31/2020  . Hypokalemia 07/31/2020  . Hypomagnesemia 07/31/2020  . Elevated troponin 07/31/2020  . NSTEMI (non-ST elevated myocardial infarction) (HCC) 07/31/2020  . Bronchiectasis (HCC)   . Acute on chronic respiratory failure with hypoxia and hypercapnia (HCC) 05/29/2018  . CAP (community acquired pneumonia) 10/12/2017  . Dyspnea 06/26/2016  . Asthma exacerbation 06/26/2016  . Hypoxia 06/12/2016  . Acute on chronic respiratory failure with hypoxia (HCC) 08/02/2015  . Asthma 04/04/2011  . Pulmonary nodule 03/06/2011     Past Surgical History:  Procedure Laterality Date  . CHOLECYSTECTOMY  2000  . CORONARY STENT PLACEMENT  1999    Prior to Admission medications   Medication Sig Start Date End Date Taking? Authorizing Provider  ascorbic acid (VITAMIN C) 500 MG tablet Take 1 tablet (500 mg total) by mouth daily. 08/04/20 09/03/20  Darlin Priestly, MD  aspirin EC 81 MG tablet Take 81 mg by mouth daily.  09/22/12   [provider]  dexamethasone (DECADRON) 6 MG tablet Take 1 tablet (6 mg total) by mouth daily for 5 days. 08/04/20 08/09/20  Darlin Priestly, MD  diltiazem (DILACOR XR) 120 MG 24 hr capsule Take 120 mg by mouth daily.     [provider]  empagliflozin (JARDIANCE) 25 MG TABS tablet Take 25 mg by mouth daily.    [provider]  glimepiride (AMARYL) 4 MG tablet Take 4 mg by mouth 2 (two) times daily.    [provider]  hydrOXYzine (ATARAX/VISTARIL) 25 MG tablet Take 25 mg by mouth 2 (two) times daily as needed for itching or sleep. 07/13/20   [provider]  sulfamethoxazole-trimethoprim (BACTRIM) 400-80 MG tablet Take 1 tablet by mouth daily. 03/17/20   [provider]  TRELEGY ELLIPTA 100-62.5-25 MCG/INH AEPB Inhale 1 puff into the lungs daily. 07/30/20   [provider]  VENTOLIN HFA 108 (90 Base) MCG/ACT inhaler Inhale 1-2 puffs into the lungs every 6 (six) hours as needed for wheezing or shortness of breath.     [provider]  zinc sulfate 220 (50 Zn) MG capsule  Take 1 capsule (220 mg total) by mouth daily. 08/04/20 09/03/20  Darlin PriestlyLai, Tina, MD    Allergies Metoprolol  Family History  Problem Relation Age of Onset  . Asthma Brother     Social History Social History   Tobacco Use  . Smoking status: Never Smoker  . Smokeless tobacco: Current User    Types: Chew  Vaping Use  . Vaping Use: Never used  Substance Use Topics  . Alcohol use: Yes    Comment: socially last drink 2 weeks ago  . Drug use: No    Review of  Systems  Review of Systems  Constitutional: Negative for chills and fever.  HENT: Negative for sore throat.   Eyes: Negative for pain.  Respiratory: Positive for cough ( chronic). Negative for stridor.   Cardiovascular: Negative for chest pain.  Gastrointestinal: Negative for vomiting.  Genitourinary: Negative for dysuria.  Musculoskeletal: Negative for myalgias.  Skin: Negative for rash.  Neurological: Negative for seizures, loss of consciousness and headaches.  Psychiatric/Behavioral: Negative for suicidal ideas. The patient has insomnia.   All other systems reviewed and are negative.     ____________________________________________   PHYSICAL EXAM:  VITAL SIGNS: ED Triage Vitals [08/06/20 1435]  Enc Vitals Group     BP 118/74     Pulse Rate 100     Resp 18     Temp 98.6 F (37 C)     Temp Source Oral     SpO2 95 %     Weight 160 lb (72.6 kg)     Height 5\' 9"  (1.753 m)     Head Circumference      Peak Flow      Pain Score 0     Pain Loc      Pain Edu?      Excl. in GC?    Vitals:   08/06/20 1725 08/06/20 1800  BP: 112/73 (!) 154/94  Pulse: 96 88  Resp: 16 (!) 24  Temp:    SpO2: 93% 96%   Physical Exam Vitals and nursing note reviewed.  Constitutional:      Appearance: He is well-developed and well-nourished.  HENT:     Head: Normocephalic and atraumatic.     Right Ear: External ear normal.     Left Ear: External ear normal.     Nose: Nose normal.     Mouth/Throat:     Mouth: Mucous membranes are moist.  Eyes:     Conjunctiva/sclera: Conjunctivae normal.  Cardiovascular:     Rate and Rhythm: Normal rate and regular rhythm.     Heart sounds: No murmur heard.   Pulmonary:     Effort: Pulmonary effort is normal. No respiratory distress.     Breath sounds: Normal breath sounds.  Abdominal:     Palpations: Abdomen is soft.     Tenderness: There is no abdominal tenderness. There is no right CVA tenderness or left CVA tenderness.  Musculoskeletal:         General: No edema.     Cervical back: Neck supple.     Right lower leg: No edema.     Left lower leg: No edema.  Skin:    General: Skin is warm and dry.     Capillary Refill: Capillary refill takes less than 2 seconds.  Neurological:     Mental Status: He is alert and oriented to person, place, and time.  Psychiatric:        Mood and Affect: Mood and affect and  mood normal.      ____________________________________________   LABS (all labs ordered are listed, but only abnormal results are displayed)  Labs Reviewed  BASIC METABOLIC PANEL - Abnormal; Notable for the following components:      Result Value   Glucose, Bld 159 (*)    BUN 33 (*)    Calcium 8.2 (*)    All other components within normal limits  CBC - Abnormal; Notable for the following components:   WBC 25.6 (*)    All other components within normal limits  URINALYSIS, COMPLETE (UACMP) WITH MICROSCOPIC - Abnormal; Notable for the following components:   Color, Urine YELLOW (*)    APPearance HAZY (*)    Glucose, UA >=500 (*)    Ketones, ur 5 (*)    Protein, ur 100 (*)    Leukocytes,Ua SMALL (*)    Bacteria, UA RARE (*)    All other components within normal limits  HEPATIC FUNCTION PANEL - Abnormal; Notable for the following components:   Albumin 3.4 (*)    Total Bilirubin 1.4 (*)    Indirect Bilirubin 1.2 (*)    All other components within normal limits  URINE CULTURE  CBG MONITORING, ED   ____________________________________________  EKG  Sinus rhythm with a left axis deviation, Q waves in anterior and septal leads and a PVC without other clear evidence of acute ischemia. These Q waves are present on ECG obtained on 12/5 and there is no clear evidence of acute changes. ____________________________________________  RADIOLOGY  ED MD interpretation: Improvement in previously noted bilateral pneumonia no evidence of acute volume overload pneumothorax, large effusion or other acute intrathoracic  process per  Official radiology report(s): DG Chest 1 View  Result Date: 08/06/2020 CLINICAL DATA:  Weakness EXAM: CHEST  1 VIEW COMPARISON:  07/31/2020 FINDINGS: The heart size and mediastinal contours are within normal limits. Significant interval improvement in heterogeneous airspace opacity previously most conspicuous in the left upper lobe, now nearly resolved. The visualized skeletal structures are unremarkable. IMPRESSION: Significant interval improvement in heterogeneous airspace opacity previously most conspicuous in the left upper lobe, now nearly resolved. No new airspace opacity. Electronically Signed   By: Lauralyn Primes M.D.   On: 08/06/2020 18:21    ____________________________________________   PROCEDURES  Procedure(s) performed (including Critical Care):  Procedures   ____________________________________________   INITIAL IMPRESSION / ASSESSMENT AND PLAN / ED COURSE        Patient presents with [place] to history exam stating he did not feel quite right and could not sleep well last night after being recently diagnosed with Covid on the fifth.  He is afebrile hemodynamically stable arrival.  His abdomen is soft nontender throughout and there is no CVA tenderness.  Lungs are clear bilaterally.  Overall very low suspicion for ACS given no acute changes on EKG and patient denying any chest pain or shortness of breath.  Chest x-ray shows findings consistent with improving pneumonia from Covid but no other acute thoracic process such as volume overload large effusion.  BMP shows no significant electrolyte or metabolic derangements.  CBC with elevated white count at 25,000 with no other evidence of acute ischemia or significant derangements.  This was noted to be 18,005 days ago and 10,003 days ago somewhat difficult to interpret as patient just finished a course of antibiotics although given UA is concerning for possible infection do not attribute his white count clearly to his  recent steroid use.  Patient denies any urinary symptoms and has no  CVA tenderness or fever and concern about possible urinary source of infection there are no other obvious foci of infection on exam patient's chest x-ray is improving and he has no respiratory symptoms at this time.  Plan to treat for UTI with Rocephin in the ED and prescription for Keflex.  He was given small IV fluid bolus for mild dehydration.  Low suspicion for acute cholecystitis as there is no significant evidence of cholestasis patient is no tenderness in the right upper quadrant or other GI symptoms.  Low suspicion for sepsis or other immediate life anthology at this time.  Given stable vital signs with reassuring exam and patient on my reassessment stated he felt "good" believe he is safe for discharge with plan for close outpatient PCP follow-up.  Discharge stable condition.  Return cautions advised discussed   ____________________________________________   FINAL CLINICAL IMPRESSION(S) / ED DIAGNOSES  Final diagnoses:  COVID-19  Acute cystitis without hematuria  Mild dehydration    Medications  cefTRIAXone (ROCEPHIN) 1 g in sodium chloride 0.9 % 100 mL IVPB (has no administration in time range)  lactated ringers bolus 500 mL (has no administration in time range)     ED Discharge Orders    None       Note:  This document was prepared using Dragon voice recognition software and may include unintentional dictation errors.   Gilles Chiquito, MD 08/06/20 [phone]

## 2021-10-26 DIAGNOSIS — Z20822 Contact with and (suspected) exposure to covid-19: Secondary | ICD-10-CM | POA: Diagnosis not present

## 2021-10-26 DIAGNOSIS — R1110 Vomiting, unspecified: Secondary | ICD-10-CM | POA: Diagnosis not present

## 2021-10-26 DIAGNOSIS — Z5321 Procedure and treatment not carried out due to patient leaving prior to being seen by health care provider: Secondary | ICD-10-CM | POA: Insufficient documentation

## 2021-10-26 DIAGNOSIS — R630 Anorexia: Secondary | ICD-10-CM | POA: Diagnosis not present

## 2021-10-27 ENCOUNTER — Emergency Department
Admission: EM | Admit: 2021-10-27 | Discharge: 2021-10-27 | Discharge status: Against Medical Advice | Payer: No Typology Code available for payment source | Attending: Emergency Medicine | Admitting: Emergency Medicine

## 2021-10-27 ENCOUNTER — Other Ambulatory Visit: Payer: Self-pay

## 2021-10-27 LAB — COMPREHENSIVE METABOLIC PANEL
ALT: 16 U/L (ref 0–44)
AST: 22 U/L (ref 15–41)
Albumin: 3.5 g/dL (ref 3.5–5.0)
Alkaline Phosphatase: 62 U/L (ref 38–126)
Anion gap: 10 (ref 5–15)
BUN: 32 mg/dL — ABNORMAL HIGH (ref 8–23)
CO2: 22 mmol/L (ref 22–32)
Calcium: 8.1 mg/dL — ABNORMAL LOW (ref 8.9–10.3)
Chloride: 106 mmol/L (ref 98–111)
Creatinine, Ser: 1.07 mg/dL (ref 0.61–1.24)
GFR, Estimated: >60 mL/min (ref >60)
Glucose, Bld: 143 mg/dL — ABNORMAL HIGH (ref 70–99)
Potassium: 4.3 mmol/L (ref 3.5–5.1)
Sodium: 138 mmol/L (ref 135–145)
Total Bilirubin: 1.6 mg/dL — ABNORMAL HIGH (ref 0.3–1.2)
Total Protein: 7.1 g/dL (ref 6.5–8.1)

## 2021-10-27 LAB — CBC
HCT: 49 % (ref 39.0–52.0)
Hemoglobin: 15.2 g/dL (ref 13.0–17.0)
MCH: 30.5 pg (ref 26.0–34.0)
MCHC: 31 g/dL (ref 30.0–36.0)
MCV: 98.4 fL (ref 80.0–100.0)
Platelets: 198 10*3/uL (ref 150–400)
RBC: 4.98 MIL/uL (ref 4.22–5.81)
RDW: 13.4 % (ref 11.5–15.5)
WBC: 7.5 10*3/uL (ref 4.0–10.5)
nRBC: 0 % (ref 0.0–0.2)

## 2021-10-27 LAB — RESP PANEL BY RT-PCR (FLU A&B, COVID) ARPGX2
Influenza A by PCR: NEGATIVE
Influenza B by PCR: NEGATIVE
SARS Coronavirus 2 by RT PCR: NEGATIVE

## 2021-10-27 LAB — LIPASE, BLOOD: Lipase: 24 U/L (ref 11–51)

## 2021-10-27 MED ORDER — ONDANSETRON HCL 4 MG/2ML IJ SOLN: 4.0000 mg | Freq: Once | INTRAMUSCULAR | Status: DC

## 2021-10-27 NOTE — ED Notes (Signed)
No answer when called several times from lobby 

## 2021-10-27 NOTE — ED Triage Notes (Signed)
Pt presents to ER c/o emesis and loss of appetite that started on Tuesday.  Pt denies abd pain or diarrhea.  Pt states he has been able to drink water but has been unable to keep meal down.  Pt A&O x4 at this time in NAD in triage.   ?

## 2022-02-18 ENCOUNTER — Emergency Department
Admission: EM | Admit: 2022-02-18 | Discharge: 2022-02-18 | Disposition: A | Discharge status: Home / Self Care | Payer: No Typology Code available for payment source | Attending: Emergency Medicine | Admitting: Emergency Medicine

## 2022-02-18 ENCOUNTER — Other Ambulatory Visit: Payer: Self-pay

## 2022-02-18 DIAGNOSIS — E119 Type 2 diabetes mellitus without complications: Secondary | ICD-10-CM | POA: Insufficient documentation

## 2022-02-18 DIAGNOSIS — I252 Old myocardial infarction: Secondary | ICD-10-CM | POA: Insufficient documentation

## 2022-02-18 DIAGNOSIS — R531 Weakness: Secondary | ICD-10-CM | POA: Diagnosis present

## 2022-02-18 DIAGNOSIS — J449 Chronic obstructive pulmonary disease, unspecified: Secondary | ICD-10-CM | POA: Diagnosis not present

## 2022-02-18 LAB — CBC
HCT: 46.9 % (ref 39.0–52.0)
Hemoglobin: 14.7 g/dL (ref 13.0–17.0)
MCH: 29.7 pg (ref 26.0–34.0)
MCHC: 31.3 g/dL (ref 30.0–36.0)
MCV: 94.7 fL (ref 80.0–100.0)
Platelets: 185 10*3/uL (ref 150–400)
RBC: 4.95 MIL/uL (ref 4.22–5.81)
RDW: 14.8 % (ref 11.5–15.5)
WBC: 6.3 10*3/uL (ref 4.0–10.5)
nRBC: 0 % (ref 0.0–0.2)

## 2022-02-18 LAB — COMPREHENSIVE METABOLIC PANEL
ALT: 14 U/L (ref 0–44)
AST: 17 U/L (ref 15–41)
Albumin: 3.5 g/dL (ref 3.5–5.0)
Alkaline Phosphatase: 61 U/L (ref 38–126)
Anion gap: 6 (ref 5–15)
BUN: 23 mg/dL (ref 8–23)
CO2: 26 mmol/L (ref 22–32)
Calcium: 8.9 mg/dL (ref 8.9–10.3)
Chloride: 105 mmol/L (ref 98–111)
Creatinine, Ser: 1.03 mg/dL (ref 0.61–1.24)
GFR, Estimated: >60 mL/min (ref >60)
Glucose, Bld: 287 mg/dL — ABNORMAL HIGH (ref 70–99)
Potassium: 4.7 mmol/L (ref 3.5–5.1)
Sodium: 137 mmol/L (ref 135–145)
Total Bilirubin: 1.3 mg/dL — ABNORMAL HIGH (ref 0.3–1.2)
Total Protein: 7.5 g/dL (ref 6.5–8.1)

## 2023-04-10 ENCOUNTER — Other Ambulatory Visit: Payer: Self-pay | Admitting: Internal Medicine

## 2023-04-10 DIAGNOSIS — R972 Elevated prostate specific antigen [PSA]: Secondary | ICD-10-CM

## 2023-04-18 ENCOUNTER — Ambulatory Visit: Admission: RE | Admit: 2023-04-18 | Payer: Medicare Other | Source: Ambulatory Visit

## 2023-04-22 ENCOUNTER — Ambulatory Visit
Admission: RE | Admit: 2023-04-22 | Discharge: 2023-04-22 | Disposition: A | Discharge status: Home / Self Care | Payer: Medicare Other | Source: Ambulatory Visit | Attending: Internal Medicine | Admitting: Internal Medicine

## 2023-04-22 DIAGNOSIS — R972 Elevated prostate specific antigen [PSA]: Secondary | ICD-10-CM | POA: Diagnosis not present

## 2023-04-22 MED ORDER — GADOBUTROL 1 MMOL/ML IV SOLN
7.0000 mL | Freq: Once | INTRAVENOUS | Status: AC | PRN
  Administered 2023-04-22: 7 mL via INTRAVENOUS

## 2023-04-25 ENCOUNTER — Emergency Department: Payer: Medicare Other

## 2023-04-25 ENCOUNTER — Other Ambulatory Visit: Payer: Self-pay

## 2023-04-25 DIAGNOSIS — Z7984 Long term (current) use of oral hypoglycemic drugs: Secondary | ICD-10-CM

## 2023-04-25 DIAGNOSIS — Z7951 Long term (current) use of inhaled steroids: Secondary | ICD-10-CM

## 2023-04-25 DIAGNOSIS — I252 Old myocardial infarction: Secondary | ICD-10-CM

## 2023-04-25 DIAGNOSIS — Z720 Tobacco use: Secondary | ICD-10-CM

## 2023-04-25 DIAGNOSIS — I2510 Atherosclerotic heart disease of native coronary artery without angina pectoris: Secondary | ICD-10-CM | POA: Diagnosis present

## 2023-04-25 DIAGNOSIS — Z8616 Personal history of COVID-19: Secondary | ICD-10-CM

## 2023-04-25 DIAGNOSIS — Z79899 Other long term (current) drug therapy: Secondary | ICD-10-CM

## 2023-04-25 DIAGNOSIS — J439 Emphysema, unspecified: Secondary | ICD-10-CM | POA: Diagnosis present

## 2023-04-25 DIAGNOSIS — E119 Type 2 diabetes mellitus without complications: Secondary | ICD-10-CM | POA: Diagnosis present

## 2023-04-25 DIAGNOSIS — J90 Pleural effusion, not elsewhere classified: Secondary | ICD-10-CM | POA: Diagnosis present

## 2023-04-25 DIAGNOSIS — R911 Solitary pulmonary nodule: Secondary | ICD-10-CM | POA: Diagnosis present

## 2023-04-25 DIAGNOSIS — Z9049 Acquired absence of other specified parts of digestive tract: Secondary | ICD-10-CM

## 2023-04-25 DIAGNOSIS — J9601 Acute respiratory failure with hypoxia: Principal | ICD-10-CM | POA: Diagnosis present

## 2023-04-25 DIAGNOSIS — J441 Chronic obstructive pulmonary disease with (acute) exacerbation: Secondary | ICD-10-CM | POA: Diagnosis present

## 2023-04-25 DIAGNOSIS — Z7982 Long term (current) use of aspirin: Secondary | ICD-10-CM

## 2023-04-25 DIAGNOSIS — Z825 Family history of asthma and other chronic lower respiratory diseases: Secondary | ICD-10-CM

## 2023-04-25 DIAGNOSIS — J690 Pneumonitis due to inhalation of food and vomit: Secondary | ICD-10-CM | POA: Diagnosis not present

## 2023-04-25 DIAGNOSIS — Z955 Presence of coronary angioplasty implant and graft: Secondary | ICD-10-CM

## 2023-04-25 DIAGNOSIS — Z1152 Encounter for screening for COVID-19: Secondary | ICD-10-CM

## 2023-04-25 DIAGNOSIS — Z602 Problems related to living alone: Secondary | ICD-10-CM | POA: Diagnosis present

## 2023-04-25 DIAGNOSIS — Z888 Allergy status to other drugs, medicaments and biological substances status: Secondary | ICD-10-CM

## 2023-04-25 DIAGNOSIS — R7989 Other specified abnormal findings of blood chemistry: Secondary | ICD-10-CM | POA: Diagnosis present

## 2023-04-25 LAB — CBC
HCT: 42.6 % (ref 39.0–52.0)
Hemoglobin: 13.7 g/dL (ref 13.0–17.0)
MCH: 30.5 pg (ref 26.0–34.0)
MCHC: 32.2 g/dL (ref 30.0–36.0)
MCV: 94.9 fL (ref 80.0–100.0)
Platelets: 216 10*3/uL (ref 150–400)
RBC: 4.49 MIL/uL (ref 4.22–5.81)
RDW: 14.5 % (ref 11.5–15.5)
WBC: 16.4 10*3/uL — ABNORMAL HIGH (ref 4.0–10.5)
nRBC: 0 % (ref 0.0–0.2)

## 2023-04-25 LAB — BASIC METABOLIC PANEL
Anion gap: 12 (ref 5–15)
BUN: 18 mg/dL (ref 8–23)
CO2: 21 mmol/L — ABNORMAL LOW (ref 22–32)
Calcium: 8.4 mg/dL — ABNORMAL LOW (ref 8.9–10.3)
Chloride: 101 mmol/L (ref 98–111)
Creatinine, Ser: 1.09 mg/dL (ref 0.61–1.24)
GFR, Estimated: >60 mL/min (ref >60)
Glucose, Bld: 159 mg/dL — ABNORMAL HIGH (ref 70–99)
Potassium: 3.8 mmol/L (ref 3.5–5.1)
Sodium: 134 mmol/L — ABNORMAL LOW (ref 135–145)

## 2023-04-25 LAB — TROPONIN I (HIGH SENSITIVITY): Troponin I (High Sensitivity): 30 ng/L — ABNORMAL HIGH (ref <18)

## 2023-04-25 NOTE — ED Triage Notes (Addendum)
Pt to ED via POV c/o weakness today. Pt reports feeling weaker than usual today. Pt was unable to get back up after sitting down. Pt able to move all extremities, no facial droop. Pt a&ox4, answering all questions appropriately. Pt lives alone and had to call neighbor for help. Denies CP, SOB, fevers, dizziness. Pt 89% RA; hx of copd.

## 2023-04-25 NOTE — ED Notes (Signed)
Pt placed on 2L [place]

## 2023-04-26 ENCOUNTER — Inpatient Hospital Stay
Admission: EM | Admit: 2023-04-26 | Discharge: 2023-04-28 | DRG: 189 | Disposition: A | Discharge status: Home Health | Payer: Medicare Other | Attending: Student | Admitting: Student

## 2023-04-26 DIAGNOSIS — Z888 Allergy status to other drugs, medicaments and biological substances status: Secondary | ICD-10-CM | POA: Diagnosis not present

## 2023-04-26 DIAGNOSIS — E1169 Type 2 diabetes mellitus with other specified complication: Secondary | ICD-10-CM

## 2023-04-26 DIAGNOSIS — J441 Chronic obstructive pulmonary disease with (acute) exacerbation: Secondary | ICD-10-CM

## 2023-04-26 DIAGNOSIS — Z7982 Long term (current) use of aspirin: Secondary | ICD-10-CM | POA: Diagnosis not present

## 2023-04-26 DIAGNOSIS — Z1152 Encounter for screening for COVID-19: Secondary | ICD-10-CM | POA: Diagnosis not present

## 2023-04-26 DIAGNOSIS — R7989 Other specified abnormal findings of blood chemistry: Secondary | ICD-10-CM | POA: Diagnosis present

## 2023-04-26 DIAGNOSIS — I2510 Atherosclerotic heart disease of native coronary artery without angina pectoris: Secondary | ICD-10-CM | POA: Diagnosis present

## 2023-04-26 DIAGNOSIS — Z79899 Other long term (current) drug therapy: Secondary | ICD-10-CM | POA: Diagnosis not present

## 2023-04-26 DIAGNOSIS — R531 Weakness: Principal | ICD-10-CM

## 2023-04-26 DIAGNOSIS — Z955 Presence of coronary angioplasty implant and graft: Secondary | ICD-10-CM | POA: Diagnosis not present

## 2023-04-26 DIAGNOSIS — J90 Pleural effusion, not elsewhere classified: Secondary | ICD-10-CM | POA: Diagnosis present

## 2023-04-26 DIAGNOSIS — Z9049 Acquired absence of other specified parts of digestive tract: Secondary | ICD-10-CM | POA: Diagnosis not present

## 2023-04-26 DIAGNOSIS — R911 Solitary pulmonary nodule: Secondary | ICD-10-CM | POA: Diagnosis present

## 2023-04-26 DIAGNOSIS — E119 Type 2 diabetes mellitus without complications: Secondary | ICD-10-CM

## 2023-04-26 DIAGNOSIS — R6510 Systemic inflammatory response syndrome (SIRS) of non-infectious origin without acute organ dysfunction: Secondary | ICD-10-CM

## 2023-04-26 DIAGNOSIS — Z602 Problems related to living alone: Secondary | ICD-10-CM | POA: Diagnosis present

## 2023-04-26 DIAGNOSIS — J449 Chronic obstructive pulmonary disease, unspecified: Secondary | ICD-10-CM

## 2023-04-26 DIAGNOSIS — I252 Old myocardial infarction: Secondary | ICD-10-CM | POA: Diagnosis not present

## 2023-04-26 DIAGNOSIS — Z7984 Long term (current) use of oral hypoglycemic drugs: Secondary | ICD-10-CM | POA: Diagnosis not present

## 2023-04-26 DIAGNOSIS — J9601 Acute respiratory failure with hypoxia: Principal | ICD-10-CM

## 2023-04-26 DIAGNOSIS — Z825 Family history of asthma and other chronic lower respiratory diseases: Secondary | ICD-10-CM | POA: Diagnosis not present

## 2023-04-26 DIAGNOSIS — J690 Pneumonitis due to inhalation of food and vomit: Secondary | ICD-10-CM | POA: Diagnosis present

## 2023-04-26 DIAGNOSIS — R0902 Hypoxemia: Secondary | ICD-10-CM

## 2023-04-26 DIAGNOSIS — Z720 Tobacco use: Secondary | ICD-10-CM | POA: Diagnosis not present

## 2023-04-26 DIAGNOSIS — J439 Emphysema, unspecified: Secondary | ICD-10-CM | POA: Diagnosis present

## 2023-04-26 DIAGNOSIS — Z8616 Personal history of COVID-19: Secondary | ICD-10-CM | POA: Diagnosis not present

## 2023-04-26 DIAGNOSIS — Z7951 Long term (current) use of inhaled steroids: Secondary | ICD-10-CM | POA: Diagnosis not present

## 2023-04-26 LAB — URINALYSIS, ROUTINE W REFLEX MICROSCOPIC
Bacteria, UA: NONE SEEN
Bilirubin Urine: NEGATIVE
Glucose, UA: >=500 mg/dL — AB
Ketones, ur: 5 mg/dL — AB
Leukocytes,Ua: NEGATIVE
Nitrite: NEGATIVE
Protein, ur: 30 mg/dL — AB
Specific Gravity, Urine: 1.028 (ref 1.005–1.030)
pH: 5 (ref 5.0–8.0)

## 2023-04-26 LAB — TROPONIN I (HIGH SENSITIVITY)
Troponin I (High Sensitivity): 43 ng/L — ABNORMAL HIGH (ref <18)
Troponin I (High Sensitivity): 40 ng/L — ABNORMAL HIGH (ref <18)

## 2023-04-26 LAB — LACTIC ACID, PLASMA: Lactic Acid, Venous: 1 mmol/L (ref 0.5–1.9)

## 2023-04-26 LAB — SARS CORONAVIRUS 2 BY RT PCR: SARS Coronavirus 2 by RT PCR: NEGATIVE

## 2023-04-26 MED ORDER — SODIUM CHLORIDE 0.9 % IV SOLN: INTRAVENOUS | Status: DC

## 2023-04-26 MED ORDER — ONDANSETRON HCL 4 MG/2ML IJ SOLN: 4.0000 mg | Freq: Three times a day (TID) | INTRAMUSCULAR | Status: AC | PRN

## 2023-04-26 MED ORDER — METHYLPREDNISOLONE SODIUM SUCC 125 MG IJ SOLR
125.0000 mg | INTRAMUSCULAR | Status: AC
  Administered 2023-04-26: 125 mg via INTRAVENOUS
  Filled 2023-04-26: qty 2

## 2023-04-26 MED ORDER — EMPAGLIFLOZIN 25 MG PO TABS
25.0000 mg | ORAL_TABLET | Freq: Every day | ORAL | Status: DC
  Administered 2023-04-27: 25 mg via ORAL
  Filled 2023-04-26 (×2): qty 1

## 2023-04-26 MED ORDER — SODIUM CHLORIDE 0.9 % IV SOLN
3.0000 g | Freq: Once | INTRAVENOUS | Status: AC
  Administered 2023-04-26: 3 g via INTRAVENOUS
  Filled 2023-04-26: qty 8

## 2023-04-26 MED ORDER — ENOXAPARIN SODIUM 40 MG/0.4ML IJ SOSY
40.0000 mg | PREFILLED_SYRINGE | INTRAMUSCULAR | Status: DC
  Administered 2023-04-26 – 2023-04-27 (×2): 40 mg via SUBCUTANEOUS
  Filled 2023-04-26 (×2): qty 0.4

## 2023-04-26 MED ORDER — IPRATROPIUM-ALBUTEROL 0.5-2.5 (3) MG/3ML IN SOLN
3.0000 mL | Freq: Four times a day (QID) | RESPIRATORY_TRACT | Status: DC
  Administered 2023-04-26 – 2023-04-27 (×3): 3 mL via RESPIRATORY_TRACT
  Filled 2023-04-26 (×3): qty 3

## 2023-04-26 MED ORDER — SODIUM CHLORIDE 0.9 % IV BOLUS
1000.0000 mL | Freq: Once | INTRAVENOUS | Status: AC
  Administered 2023-04-26: 1000 mL via INTRAVENOUS

## 2023-04-26 MED ORDER — ALBUTEROL SULFATE (2.5 MG/3ML) 0.083% IN NEBU: 2.5000 mg | INHALATION_SOLUTION | RESPIRATORY_TRACT | Status: DC | PRN

## 2023-04-26 MED ORDER — ENSURE ENLIVE PO LIQD
237.0000 mL | Freq: Two times a day (BID) | ORAL | Status: DC
  Administered 2023-04-26 – 2023-04-28 (×4): 237 mL via ORAL

## 2023-04-26 MED ORDER — UMECLIDINIUM BROMIDE 62.5 MCG/ACT IN AEPB
1.0000 | INHALATION_SPRAY | Freq: Every day | RESPIRATORY_TRACT | Status: DC
Start: 2023-04-26 — End: 2023-04-26

## 2023-04-26 MED ORDER — HYDROMORPHONE HCL 1 MG/ML IJ SOLN: 0.5000 mg | INTRAMUSCULAR | Status: AC | PRN

## 2023-04-26 MED ORDER — FLUTICASONE FUROATE-VILANTEROL 100-25 MCG/ACT IN AEPB
1.0000 | INHALATION_SPRAY | Freq: Every day | RESPIRATORY_TRACT | Status: DC
  Administered 2023-04-27: 1 via RESPIRATORY_TRACT
  Filled 2023-04-26 (×2): qty 28

## 2023-04-26 MED ORDER — PREDNISONE 20 MG PO TABS
40.0000 mg | ORAL_TABLET | Freq: Every day | ORAL | Status: DC
  Administered 2023-04-27: 40 mg via ORAL
  Filled 2023-04-26: qty 2

## 2023-04-26 MED ORDER — ADULT MULTIVITAMIN W/MINERALS CH
1.0000 | ORAL_TABLET | Freq: Every day | ORAL | Status: DC
  Administered 2023-04-27: 1 via ORAL
  Filled 2023-04-26 (×2): qty 1

## 2023-04-26 MED ORDER — SODIUM CHLORIDE 0.9 % IV SOLN
3.0000 g | Freq: Four times a day (QID) | INTRAVENOUS | Status: DC
  Administered 2023-04-26 – 2023-04-28 (×8): 3 g via INTRAVENOUS
  Filled 2023-04-26 (×10): qty 8

## 2023-04-26 NOTE — Evaluation (Signed)
Physical Therapy Evaluation Patient Details Name: George Hensley MRN: [id number] DOB: Apr 09, 1931 Today's Date: 04/26/2023  History of Present Illness  Pt is an 89 y.o. male with medical history significant of COPD, type 2 diabetes, and CAD who presented with cough and SOB.  MD assessment includes: aspiration pneumonia, COPD exacerbation, elevated troponin, and acute respiratory failure with hypoxia.   Clinical Impression  Pt was pleasant and motivated to participate during the session and put forth good effort throughout. Pt found on room air (RA) with SpO2 87-88%.  Pt required extra time and effort and use of bed rails during sup to sit but no physical assistance.  Upon coming to sitting at EOB pt's SpO2 on RA found to be 84%.  Pt placed on 2L with SpO2 at rest returning to the low 90s.  Pt ambulated 30 feet on 2L with slow cadence and flexed posture with some cues for general sequencing with SpO2 94%.  Pt then ambulated 75 feet on 2L with SpO2 dropping to 86%.  Pt placed on 3L and was able to amb an additional 75 feet with SpO2 dropping to a low of 92%.  Pt left on 2L at end of session with SpO2 in the low 90s, nsg and MD notified.  Pt generally steady with amb with no overt LOB and with no adverse symptoms noted other than desaturation per above but without associated SOB and with HR WNL.  Pt will benefit from continued PT services upon discharge to safely address deficits listed in patient problem list for decreased caregiver assistance and eventual return to PLOF.          If plan is discharge home, recommend the following: A little help with walking and/or transfers;Assist for transportation   Can travel by private vehicle        Equipment Recommendations None recommended by PT  Recommendations for Other Services       Functional Status Assessment Patient has had a recent decline in their functional status and demonstrates the ability to make significant improvements in function in a  reasonable and predictable amount of time.     Precautions / Restrictions Precautions Precautions: Fall Restrictions Weight Bearing Restrictions: No Other Position/Activity Restrictions: watch SpO2      Mobility  Bed Mobility Overal bed mobility: Modified Independent             General bed mobility comments: Min extra time and effort only    Transfers Overall transfer level: Needs assistance Equipment used: Rolling walker (2 wheels) Transfers: Sit to/from Stand Sit to Stand: Supervision           General transfer comment: Good eccentric and concentric control and stability    Ambulation/Gait Ambulation/Gait assistance: Supervision Gait Distance (Feet): 30 Feet x 1, 75 Feet x 2 Assistive device: Rolling walker (2 wheels) Gait Pattern/deviations: Step-through pattern, Decreased step length - right, Decreased step length - left, Trunk flexed Gait velocity: decreased     General Gait Details: Mildly reduced cadence and step length but steady with no overt LOB, min verbal cues for amb closer to the RW with upright posture and for proper positioning during turns  Stairs            Wheelchair Mobility     Tilt Bed    Modified Rankin (Stroke Patients Only)       Balance Overall balance assessment: Needs assistance   Sitting balance-Leahy Scale: Normal     Standing balance support: Bilateral upper extremity supported, During  functional activity Standing balance-Leahy Scale: Good                               Pertinent Vitals/Pain Pain Assessment Pain Assessment: No/denies pain    Home Living Family/patient expects to be discharged to:: Private residence Living Arrangements: Alone (spouse with recent CVA and is at a SNF for rehab) Available Help at Discharge: Friend(s);Available PRN/intermittently Type of Home: House Home Access: Level entry       Home Layout: One level Home Equipment: Rolling Walker (2 wheels);Grab bars -  tub/shower;Grab bars - toilet Additional Comments: spouse with recent CVA and is at a SNF for rehab    Prior Function Prior Level of Function : Independent/Modified Independent             Mobility Comments: Ind amb community distances but prefers to hold a shopping cart or back of a w/c for longer walks, no fall history ADLs Comments: Ind with ADLs     Extremity/Trunk Assessment   Upper Extremity Assessment Upper Extremity Assessment: Generalized weakness    Lower Extremity Assessment Lower Extremity Assessment: Generalized weakness       Communication   Communication Communication: No apparent difficulties  Cognition Arousal: Alert Behavior During Therapy: WFL for tasks assessed/performed Overall Cognitive Status: Within Functional Limits for tasks assessed                                          General Comments      Exercises     Assessment/Plan    PT Assessment Patient needs continued PT services  PT Problem List Decreased strength;Decreased activity tolerance;Decreased balance;Decreased mobility;Decreased knowledge of use of DME       PT Treatment Interventions DME instruction;Gait training;Functional mobility training;Therapeutic activities;Therapeutic exercise;Balance training;Patient/family education    PT Goals (Current goals can be found in the Care Plan section)  Acute Rehab PT Goals Patient Stated Goal: To get stronger PT Goal Formulation: With patient Time For Goal Achievement: 05/09/23 Potential to Achieve Goals: Good    Frequency Min 1X/week     Co-evaluation               AM-PAC PT "6 Clicks" Mobility  Outcome Measure Help needed turning from your back to your side while in a flat bed without using bedrails?: A Little Help needed moving from lying on your back to sitting on the side of a flat bed without using bedrails?: A Little Help needed moving to and from a bed to a chair (including a wheelchair)?: A  Little Help needed standing up from a chair using your arms (e.g., wheelchair or bedside chair)?: A Little Help needed to walk in hospital room?: A Little Help needed climbing 3-5 steps with a railing? : A Little 6 Click Score: 18    End of Session Equipment Utilized During Treatment: Gait belt;Oxygen Activity Tolerance: Patient tolerated treatment well Patient left: in chair;with call bell/phone within reach;with chair alarm set Nurse Communication: Mobility status;Other (comment) (SpO2 with activity per above) PT Visit Diagnosis: Unsteadiness on feet (R26.81);Difficulty in walking, not elsewhere classified (R26.2);Muscle weakness (generalized) (M62.81)    Time: 4403-4742 PT Time Calculation (min) (ACUTE ONLY): 36 min   Charges:   PT Evaluation $PT Eval Moderate Complexity: 1 Mod PT Treatments $Gait Training: 8-22 mins PT General Charges $$ ACUTE PT VISIT: 1  Visit       D. Elly Modena PT, DPT 04/26/23, 5:33 PM

## 2023-04-26 NOTE — Assessment & Plan Note (Signed)
Troponin 30s to 40s in the setting of aspiration pneumonia, COPD, and acute respiratory failure with hypoxia Suspect mild min ischemia in the setting of respiratory disease Monitor

## 2023-04-26 NOTE — H&P (Addendum)
History and Physical    Patient: George Hensley LKG:[id number] DOB: 12-24-1930 DOA: 04/26/2023 DOS: the patient was seen and examined on 04/26/2023 PCP: Danella Penton, MD  Patient coming from: Home  Chief Complaint:  Chief Complaint  Patient presents with   Weakness   HPI: George Hensley is a 89 y.o. male with medical history significant of COPD, type 2 diabetes, CAD presenting with aspiration pneumonia, COPD exacerbation, elevated troponin.  Patient reports increased work of breathing cough shortness of breath of 1 to 2 days.  Positive generalized weakness.  Mild wheezing.  No abdominal pain nausea vomiting.  No hemiparesis or confusion. Presented to the ER afebrile, hemodynamically stable.  Requiring 2 L nasal cannula to keep O2 sats greater 94%.  White count 16.4, hemoglobin 13.7, platelets 216, troponin 30s to 40s. EKG sinus rhythm w/ PVCs.  Chest x-ray with patchy bibasilar airspace disease superimposed on background of edema concerning for infection aspiration. Review of Systems: As mentioned in the history of present illness. All other systems reviewed and are negative. Past Medical History:  Diagnosis Date   Asthma    Bronchiectasis (HCC)    COPD (chronic obstructive pulmonary disease) (HCC)    Diabetes mellitus    Past Surgical History:  Procedure Laterality Date   CHOLECYSTECTOMY  2000   CORONARY STENT PLACEMENT  1999   Social History:  reports that he has never smoked. His smokeless tobacco use includes chew. He reports current alcohol use. He reports that he does not use drugs.  Allergies  Allergen Reactions   Metoprolol     Pt states syncope with lopressor    Family History  Problem Relation Age of Onset   Asthma Brother     Prior to Admission medications   Medication Sig Start Date End Date Taking? Authorizing Provider  albuterol (VENTOLIN HFA) 108 (90 Base) MCG/ACT inhaler Inhale 1-2 puffs into the lungs every 6 (six) hours as needed for wheezing or  shortness of breath.   Yes [provider]  aspirin EC 81 MG tablet Take 81 mg by mouth daily.    Yes [provider]  diltiazem (DILACOR XR) 120 MG 24 hr capsule Take 120 mg by mouth daily.    Yes [provider]  empagliflozin (JARDIANCE) 25 MG TABS tablet Take 25 mg by mouth daily.   Yes [provider]  Fluticasone-Umeclidin-Vilant 100-62.5-25 MCG/ACT AEPB Inhale 1 puff into the lungs daily.   Yes [provider]  glipiZIDE (GLUCOTROL XL) 10 MG 24 hr tablet Take 10 mg by mouth daily.   Yes [provider]  hydrOXYzine (ATARAX) 25 MG tablet Take 25 mg by mouth 2 (two) times daily as needed for itching.   Yes [provider]  montelukast (SINGULAIR) 10 MG tablet Take 1 tablet by mouth daily.   Yes [provider]  tamsulosin (FLOMAX) 0.4 MG CAPS capsule Take 0.4 mg by mouth daily.   Yes [provider]    Physical Exam: Vitals:   04/26/23 0709 04/26/23 0800 04/26/23 0900 04/26/23 1000  BP: 116/73 120/75 127/70 117/66  Pulse: 74 71 72 70  Resp: (!) 23 (!) 21 (!) 27 (!) 23  Temp: (!) 97.4 F (36.3 C)     TempSrc: Oral     SpO2: 98% 97% 100% 100%  Weight:      Height:       Physical Exam Constitutional:      Appearance: He is normal weight.  HENT:  Head: Normocephalic and atraumatic.     Nose: Nose normal.     Mouth/Throat:     Mouth: Mucous membranes are moist.  Cardiovascular:     Rate and Rhythm: Normal rate and regular rhythm.  Pulmonary:     Breath sounds: Wheezing and rales present.  Abdominal:     General: Bowel sounds are normal.  Musculoskeletal:        General: Normal range of motion.     Cervical back: Normal range of motion.  Skin:    General: Skin is warm.  Neurological:     General: No focal deficit present.  Psychiatric:        Mood and Affect: Mood normal.     Data Reviewed:  There are no new results to review at this time.  DG Chest 2 View CLINICAL DATA:  Hypoxia,  weakness  EXAM: CHEST - 2 VIEW  COMPARISON:  08/06/2020  FINDINGS: Frontal and lateral views of the chest demonstrate an unremarkable cardiac silhouette. Continued background emphysema. There is patchy bibasilar airspace disease, greatest in the left lower lobe. Trace bilateral effusions. No pneumothorax. No acute bony abnormalities.  IMPRESSION: 1. Patchy bibasilar airspace disease superimposed upon background emphysema. This could reflect infection or aspiration. 2. Trace bilateral pleural effusions.  Electronically Signed   By: Sharlet Salina M.D.   On: 04/25/2023 21:50  Lab Results  Component Value Date   WBC 16.4 (H) 04/25/2023   HGB 13.7 04/25/2023   HCT 42.6 04/25/2023   MCV 94.9 04/25/2023   PLT 216 04/25/2023   Last metabolic panel Lab Results  Component Value Date   GLUCOSE 159 (H) 04/25/2023   NA 134 (L) 04/25/2023   K 3.8 04/25/2023   CL 101 04/25/2023   CO2 21 (L) 04/25/2023   BUN 18 04/25/2023   CREATININE 1.09 04/25/2023   GFRNONAA >60 04/25/2023   CALCIUM 8.4 (L) 04/25/2023   PHOS 2.7 07/31/2020   PROT 7.5 02/18/2022   ALBUMIN 3.5 02/18/2022   BILITOT 1.3 (H) 02/18/2022   ALKPHOS 61 02/18/2022   AST 17 02/18/2022   ALT 14 02/18/2022   ANIONGAP 12 04/25/2023    Assessment and Plan: * Aspiration pneumonia (HCC) Decompensated respiratory status now requiring 2 L nasal cannula with noted cough and?  Aspiration event Chest x-ray with p atchy bibasilar airspace disease superimposed upon background Emphysema IV Unasyn started in the ER Will continue Blood and respiratory cultures SLP consult Team supplemental oxygen as needed Follow   COPD (chronic obstructive pulmonary disease) (HCC) Baseline COPD Noted concurrent aspiration pneumonia Does appear to have some degree of wheezing associated with presentation Will start on treatment for overlapping mild COPD exacerbation Continue IV Unasyn DuoNebs Continue supplemental  oxygen Follow   Elevated troponin Troponin 30s to 40s in the setting of aspiration pneumonia, COPD, and acute respiratory failure with hypoxia Suspect mild min ischemia in the setting of respiratory disease Monitor  Diabetes mellitus without complication (HCC) SSI A1c  Acute respiratory failure with hypoxia (HCC) Decompensated respiratory status now requiring 2L nasal cannula in the setting of aspiration pneumonia and mild COPD exacerbation Chest x-ray concerning for aspiration event IV Unasyn IV Solu-Medrol DuoNebs Supplemental oxygen        Advance Care Planning:   Code Status: Prior   Consults: None   Family Communication: No family at the bedside   Severity of Illness: The appropriate patient status for this patient is INPATIENT. Inpatient status is judged to be reasonable and necessary in order to  provide the required intensity of service to ensure the patient's safety. The patient's presenting symptoms, physical exam findings, and initial radiographic and laboratory data in the context of their chronic comorbidities is felt to place them at high risk for further clinical deterioration. Furthermore, it is not anticipated that the patient will be medically stable for discharge from the hospital within 2 midnights of admission.   * I certify that at the point of admission it is my clinical judgment that the patient will require inpatient hospital care spanning beyond 2 midnights from the point of admission due to high intensity of service, high risk for further deterioration and high frequency of surveillance required.*  Author: Floydene Flock, MD 04/26/2023 11:58 AM  For on call review [email or web address].

## 2023-04-26 NOTE — ED Provider Notes (Signed)
[facility] Provider Note    Event Date/Time   First MD Initiated Contact with Patient 04/26/23 0400     (approximate)   History   Weakness   HPI  George Hensley is a 89 y.o. male who presents to the ED from home with a chief complaint of generalized weakness.  History of COPD not on home oxygen, diabetes who states he was unable to get up after sitting down today.  Patient lives alone and had to call a neighbor for help.  Room air saturation 89% on arrival, placed on 2 L nasal cannula oxygen.  Endorses loose cough and shortness of breath.  Denies fever/chills, chest pain, abdominal pain, nausea, vomiting or dizziness.     Past Medical History   Past Medical History:  Diagnosis Date   Asthma    Bronchiectasis (HCC)    COPD (chronic obstructive pulmonary disease) (HCC)    Diabetes mellitus      Active Problem List   Patient Active Problem List   Diagnosis Date Noted   Pneumonia due to COVID-19 virus 07/31/2020   COPD exacerbation (HCC) 07/31/2020   Diabetes mellitus without complication (HCC) 07/31/2020   Sepsis (HCC) 07/31/2020   Hypokalemia 07/31/2020   Hypomagnesemia 07/31/2020   Elevated troponin 07/31/2020   NSTEMI (non-ST elevated myocardial infarction) (HCC) 07/31/2020   Bronchiectasis (HCC)    Acute on chronic respiratory failure with hypoxia and hypercapnia (HCC) 05/29/2018   CAP (community acquired pneumonia) 10/12/2017   Dyspnea 06/26/2016   Asthma exacerbation 06/26/2016   Hypoxia 06/12/2016   Acute on chronic respiratory failure with hypoxia (HCC) 08/02/2015   Asthma 04/04/2011   Pulmonary nodule 03/06/2011     Past Surgical History   Past Surgical History:  Procedure Laterality Date   CHOLECYSTECTOMY  2000   CORONARY STENT PLACEMENT  1999     Home Medications   Prior to Admission medications   Medication Sig Start Date End Date Taking? Authorizing Provider  aspirin EC 81 MG tablet Take 81 mg by mouth daily.   09/22/12   [provider]  diltiazem (DILACOR XR) 120 MG 24 hr capsule Take 120 mg by mouth daily.     [provider]  empagliflozin (JARDIANCE) 25 MG TABS tablet Take 25 mg by mouth daily.    [provider]  glimepiride (AMARYL) 4 MG tablet Take 4 mg by mouth 2 (two) times daily.    [provider]  hydrOXYzine (ATARAX/VISTARIL) 25 MG tablet Take 25 mg by mouth 2 (two) times daily as needed for itching or sleep. 07/13/20   [provider]  sulfamethoxazole-trimethoprim (BACTRIM) 400-80 MG tablet Take 1 tablet by mouth daily. 03/17/20   [provider]  TRELEGY ELLIPTA 100-62.5-25 MCG/INH AEPB Inhale 1 puff into the lungs daily. 07/30/20   [provider]  VENTOLIN HFA 108 (90 Base) MCG/ACT inhaler Inhale 1-2 puffs into the lungs every 6 (six) hours as needed for wheezing or shortness of breath.     [provider]     Allergies  Metoprolol   Family History   Family History  Problem Relation Age of Onset   Asthma Brother      Physical Exam  Triage Vital Signs: ED Triage Vitals  Encounter Vitals Group     BP 04/25/23 2103 130/68     Systolic BP Percentile --      Diastolic BP Percentile --      Pulse Rate 04/25/23 2103 100  Resp 04/25/23 2103 17     Temp 04/25/23 2103 99.2 F (37.3 C)     Temp Source 04/25/23 2103 Oral     SpO2 04/25/23 2103 (!) 89 %     Weight 04/25/23 2106 155 lb (70.3 kg)     Height 04/25/23 2106 5\' 10"  (1.778 m)     Head Circumference --      Peak Flow --      Pain Score --      Pain Loc --      Pain Education --      Exclude from Growth Chart --     Updated Vital Signs: BP 128/78 (BP Location: Right Arm)   Pulse 89   Temp 98.3 F (36.8 C) (Oral)   Resp 18   Ht 5\' 10"  (1.778 m)   Wt 70.3 kg   SpO2 96%   BMI 22.24 kg/m    General: Awake, mild distress.  CV:  RRR.  Good peripheral perfusion.  Resp:  Normal effort.  Loose rattley cough, scattered  rhonchi. Abd:  Nontender.  No distention.  Other:  No pedal edema.   ED Results / Procedures / Treatments  Labs (all labs ordered are listed, but only abnormal results are displayed) Labs Reviewed  BASIC METABOLIC PANEL - Abnormal; Notable for the following components:      Result Value   Sodium 134 (*)    CO2 21 (*)    Glucose, Bld 159 (*)    Calcium 8.4 (*)    All other components within normal limits  CBC - Abnormal; Notable for the following components:   WBC 16.4 (*)    All other components within normal limits  TROPONIN I (HIGH SENSITIVITY) - Abnormal; Notable for the following components:   Troponin I (High Sensitivity) 30 (*)    All other components within normal limits  TROPONIN I (HIGH SENSITIVITY) - Abnormal; Notable for the following components:   Troponin I (High Sensitivity) 43 (*)    All other components within normal limits  SARS CORONAVIRUS 2 BY RT PCR  CULTURE, BLOOD (ROUTINE X 2)  CULTURE, BLOOD (ROUTINE X 2)  LACTIC ACID, PLASMA  URINALYSIS, ROUTINE W REFLEX MICROSCOPIC  CBG MONITORING, ED     EKG  ED ECG REPORT I, Anapaola Kinsel J, the attending physician, personally viewed and interpreted this ECG.   Date: 04/26/2023  EKG Time: 2104  Rate: 100  Rhythm: sinus tachycardia  Axis: LAD  Intervals:left anterior fascicular block and nonspecific intraventricular conduction delayLeft bundle branch block  ST&T Change: Nonspecific No significant change compared to EKG dated 02/19/2022   RADIOLOGY I have independently visualized and interpreted patient's x-ray as well as noted the radiology interpretation:  Chest x-ray: Patchy bibasilar airspace disease, infection versus aspiration  Official radiology report(s): DG Chest 2 View  Result Date: 04/25/2023 CLINICAL DATA:  Hypoxia, weakness EXAM: CHEST - 2 VIEW COMPARISON:  08/06/2020 FINDINGS: Frontal and lateral views of the chest demonstrate an unremarkable cardiac silhouette. Continued background emphysema.  There is patchy bibasilar airspace disease, greatest in the left lower lobe. Trace bilateral effusions. No pneumothorax. No acute bony abnormalities. IMPRESSION: 1. Patchy bibasilar airspace disease superimposed upon background emphysema. This could reflect infection or aspiration. 2. Trace bilateral pleural effusions. Electronically Signed   By: Sharlet Salina M.D.   On: 04/25/2023 21:50     PROCEDURES:  Critical Care performed: Yes, see critical care procedure note(s)  CRITICAL CARE Performed by: Irean Hong   Total critical care time:  30 minutes  Critical care time was exclusive of separately billable procedures and treating other patients.  Critical care was necessary to treat or prevent imminent or life-threatening deterioration.  Critical care was time spent personally by me on the following activities: development of treatment plan with patient and/or surrogate as well as nursing, discussions with consultants, evaluation of patient's response to treatment, examination of patient, obtaining history from patient or surrogate, ordering and performing treatments and interventions, ordering and review of laboratory studies, ordering and review of radiographic studies, pulse oximetry and re-evaluation of patient's condition.   Marland Kitchen1-3 Lead EKG Interpretation  Performed by: Irean Hong, MD Authorized by: Irean Hong, MD     Interpretation: normal     ECG rate:  85   ECG rate assessment: normal     Rhythm: sinus rhythm     Ectopy: none     Conduction: normal   Comments:     Patient placed on cardiac monitor to evaluate for arrhythmias    MEDICATIONS ORDERED IN ED: Medications  Ampicillin-Sulbactam (UNASYN) 3 g in sodium chloride 0.9 % 100 mL IVPB (has no administration in time range)  sodium chloride 0.9 % bolus 1,000 mL (has no administration in time range)     IMPRESSION / MDM / ASSESSMENT AND PLAN / ED COURSE  I reviewed the triage vital signs and the nursing notes.                              89 year old male presenting with generalized weakness, found to be hypoxic. Differential includes, but is not limited to, viral syndrome, bronchitis including COPD exacerbation, pneumonia, reactive airway disease including asthma, CHF including exacerbation with or without pulmonary/interstitial edema, pneumothorax, ACS, thoracic trauma, and pulmonary embolism.  I personally reviewed patient's records and note a PCP office visit on 04/10/2023 for routine maintenance exam.  Patient's presentation is most consistent with acute presentation with potential threat to life or bodily function.  The patient is on the cardiac monitor to evaluate for evidence of arrhythmia and/or significant heart rate changes.  Laboratory results demonstrate moderate leukocytosis with WBC 16, unremarkable electrolytes, increasing troponins likely secondary to demand ischemia.  Added lactic acid which is negative.  Will administer IV fluids, Unasyn and consult hospitalist services for evaluation and admission.      FINAL CLINICAL IMPRESSION(S) / ED DIAGNOSES   Final diagnoses:  Generalized weakness  Hypoxia  Chronic obstructive pulmonary disease, unspecified COPD type (HCC)  Aspiration pneumonia, unspecified aspiration pneumonia type, unspecified laterality, unspecified part of lung (HCC)  Elevated troponin  SIRS (systemic inflammatory response syndrome) (HCC)     Rx / DC Orders   ED Discharge Orders     None        Note:  This document was prepared using Dragon voice recognition software and may include unintentional dictation errors.   Irean Hong, MD 04/26/23 [phone]

## 2023-04-26 NOTE — Assessment & Plan Note (Signed)
Decompensated respiratory status now requiring 2 L nasal cannula with noted cough and?  Aspiration event Chest x-ray with p atchy bibasilar airspace disease superimposed upon background Emphysema IV Unasyn started in the ER Will continue Blood and respiratory cultures SLP consult Team supplemental oxygen as needed Follow

## 2023-04-26 NOTE — Progress Notes (Signed)
Initial Nutrition Assessment  DOCUMENTATION CODES:   Not applicable  INTERVENTION:   -Ensure Enlive po BID, each supplement provides 350 kcal and 20 grams of protein.  -MVI with minerals daily  NUTRITION DIAGNOSIS:   Increased nutrient needs related to chronic illness (COPD) as evidenced by estimated needs.  GOAL:   Patient will meet greater than or equal to 90% of their needs  MONITOR:   PO intake, Supplement acceptance  REASON FOR ASSESSMENT:   Consult Assessment of nutrition requirement/status  ASSESSMENT:   Pt with medical history significant of COPD, type 2 diabetes, CAD presenting with aspiration pneumonia, COPD exacerbation, elevated troponin.  Patient reports increased work of breathing cough shortness of breath of 1 to 2 days PTA.  Pt admitted with aspiration pneumonia and COPD.   Pt unavailable at time of visit. Attempted to speak with pt via call to hospital room phone, however, unable to reach. RD unable to obtain further nutrition-related history or complete nutrition-focused physical exam at this time.    Pt currently on a regular diet. No meal completion data available to assess at this time.   Reviewed wt hx; wt has been stable over the past year.   Pt with increased nutritional needs and would benefit from addition of nutritional supplements.    Medications and reviewed and include prednisone and 0.9% sodium chloride infusion @ 75 ml/hr.   Lab Results  Component Value Date   HGBA1C 7.7 (H) 07/31/2020   PTA DM medications are 10 mg glipizide daily and 25 mg empagliflozin daily.   Labs reviewed: Na: 134, CBGS: 208 (inpatient orders for glycemic control are none).    Diet Order:   Diet Order             Diet regular Room service appropriate? Yes with Assist; Fluid consistency: Thin  Diet effective now                   EDUCATION NEEDS:   No education needs have been identified at this time  Skin:  Skin Assessment: Reviewed RN  Assessment  Last BM:  Unknown  Height:   Ht Readings from Last 1 Encounters:  04/25/23 5\' 10"  (1.778 m)    Weight:   Wt Readings from Last 1 Encounters:  04/25/23 70.3 kg    Ideal Body Weight:  75.5 kg  BMI:  Body mass index is 22.24 kg/m.  Estimated Nutritional Needs:   Kcal:  1700-1900  Protein:  90-105 grams  Fluid:  > 1.7 L    Levada Schilling, RD, LDN, CDCES Registered Dietitian II Certified Diabetes Care and Education Specialist Please refer to [facility] for RD and/or RD on-call/weekend/after hours pager

## 2023-04-26 NOTE — Assessment & Plan Note (Signed)
Baseline COPD Noted concurrent aspiration pneumonia Does appear to have some degree of wheezing associated with presentation Will start on treatment for overlapping mild COPD exacerbation Continue IV Unasyn DuoNebs Continue supplemental oxygen Follow

## 2023-04-26 NOTE — Assessment & Plan Note (Signed)
SSI A1c 

## 2023-04-26 NOTE — Evaluation (Addendum)
Clinical/Bedside Swallow Evaluation Patient Details  Name: George Hensley MRN: [id number] Date of Birth: 04-13-1931  Today's Date: 04/26/2023 Time: SLP Start Time (ACUTE ONLY): 1245 SLP Stop Time (ACUTE ONLY): 1345 SLP Time Calculation (min) (ACUTE ONLY): 60 min  Past Medical History:  Past Medical History:  Diagnosis Date   Asthma    Bronchiectasis (HCC)    COPD (chronic obstructive pulmonary disease) (HCC)    Diabetes mellitus    Past Surgical History:  Past Surgical History:  Procedure Laterality Date   CHOLECYSTECTOMY  2000   CORONARY STENT PLACEMENT  1999   HPI:  Pt is a 89 y.o. male with medical history significant of asthma, COPD, type 2 diabetes, bronchiectasis, CAD presenting w/ COPD exacerbation, elevated troponin.  Patient reports increased work of breathing cough shortness of breath of 1 to 2 days.  Positive generalized weakness.  Mild wheezing.  No abdominal pain nausea vomiting.  No hemiparesis or confusion.  Chest Imaging: Patchy bibasilar airspace disease superimposed upon background  emphysema. This could reflect infection or aspiration.  2. Trace bilateral pleural effusions.  Previous chest imaging was in 2021: "Emphysema with patchy airspace disease in the left lung, most  prominently in the left upper lobe. Features suggest pneumonia."; no chest imaging since. Pt denied any swallowing issues at home.    Assessment / Plan / Recommendation  Clinical Impression   Pt seen for BSE today. Pt awake, verbal and engaged appropriately in conversation w/ this SLP. Requested a Advertising account planner d/t his phone having low battery. Pt denied any swallowing difficulty now or at home prior.  On [place] 2L O2 support, afebrile. WBC elevated.  Pt appears to present w/ functional oropharyngeal phase swallow in setting of declined Pulmonary function/status at Baseline(see H&P) w/ No overt oropharyngeal phase dysphagia noted, No neuromuscular deficits noted. Pt consumed po trials w/ No immediate,  overt clinical s/s of aspiration during po trials. No decline in ANS.  Pt appears at reduced risk for aspiration when following general aspiration precautions.   However, pt does have challenging factors that could impact oropharyngeal swallowing to include Acute illness and Baseline Pulmonary decline, and advanced age. ANY Pulmonary decline/changes can impact the timing of the apnea moment when swallowing. These factors can increase risk for aspiration, dysphagia as well as decreased oral intake overall.    During po trials, pt consumed all consistencies w/ no overt coughing, decline in vocal quality, or change in respiratory presentation during/post trials(except a delayed cough was noted x1 post multiple swallows of thin liquids via Straw -- a hack cough sound). O2 sats remained 98-99% during/post assessment. Oral phase appeared [facility] w/ timely bolus management, mastication of softened solids, and control of bolus propulsion for A-P transfer for swallowing. Oral clearing achieved w/ all trial consistencies -- moistened, soft foods given.  OM Exam appeared [facility] w/ no unilateral labial/lingual weakness noted; Speech Clear, intelligible. Pt fed self w/ setup support.    Recommend a fairly Regular consistency diet w/ well-Cut meats, moistened foods; Thin liquids via Cup - NO STRAWS. Recommend general aspiration precautions, tray setup and support at meals as needed. Rest Breaks during meals to lessen any WOB. Pills WHOLE in Puree for safer, easier swallowing -- pt has been taking w/ a banana at home prior as his Baseline. This was encouraged.    Education given on Pills in Puree; food consistencies and easy to eat options; general aspiration precautions to pt. No further skilled ST services indicated. NSG to reconsult if any new  needs arise during admit. Pt agreed. MD/NSG updated, agreed. SLP Visit Diagnosis: Dysphagia, unspecified (R13.10) (pulmonary decline at baseline)    Aspiration Risk   (reduced  following general precautions)    Diet Recommendation   Thin;Age appropriate regular (cut, moist foods) = a fairly Regular consistency diet w/ well-Cut meats, moistened foods; Thin liquids via Cup - NO STRAWS. Recommend general aspiration precautions, tray setup and support at meals as needed. Rest Breaks during meals to lessen any WOB.   Medication Administration: Whole meds with puree (baseline at home)    Other  Recommendations Oral Care Recommendations: Oral care BID;Patient independent with oral care    Recommendations for follow up therapy are one component of a multi-disciplinary discharge planning process, led by the attending physician.  Recommendations may be updated based on patient status, additional functional criteria and insurance authorization.  Follow up Recommendations No SLP follow up      Assistance Recommended at Discharge  PRN  Functional Status Assessment Patient has had a recent decline in their functional status and demonstrates the ability to make significant improvements in function in a reasonable and predictable amount of time.  Frequency and Duration  (n/a)   (n/a)       Prognosis Prognosis for improved oropharyngeal function: Fair (-Good) Barriers to Reach Goals: Time post onset;Severity of deficits Barriers/Prognosis Comment: baseline pulmonary issues; advanced age      Swallow Study   General Date of Onset: 04/25/23 HPI: Pt is a 89 y.o. male with medical history significant of asthma, COPD, type 2 diabetes, bronchiectasis, CAD presenting w/ COPD exacerbation, elevated troponin.  Patient reports increased work of breathing cough shortness of breath of 1 to 2 days.  Positive generalized weakness.  Mild wheezing.  No abdominal pain nausea vomiting.  No hemiparesis or confusion.  Chest Imaging: Patchy bibasilar airspace disease superimposed upon background  emphysema. This could reflect infection or aspiration.  2. Trace bilateral pleural effusions.  Previous  chest imaging was in 2021: "Emphysema with patchy airspace disease in the left lung, most  prominently in the left upper lobe. Features suggest pneumonia.". Type of Study: Bedside Swallow Evaluation Previous Swallow Assessment: none Diet Prior to this Study: NPO (regular diet at home) Temperature Spikes Noted: No (wbc elevated) Respiratory Status: Nasal cannula (2L) History of Recent Intubation: No Behavior/Cognition: Alert;Cooperative;Pleasant mood Oral Cavity Assessment: Within Functional Limits Oral Care Completed by SLP: Yes Oral Cavity - Dentition: Adequate natural dentition (partial) Vision: Functional for self-feeding Self-Feeding Abilities: Able to feed self (setup) Patient Positioning: Upright in bed (min assist) Baseline Vocal Quality: Normal Volitional Cough: Strong Volitional Swallow: Able to elicit    Oral/Motor/Sensory Function Overall Oral Motor/Sensory Function: Within functional limits   Ice Chips Ice chips: Within functional limits Presentation: Spoon (fed; 2 trials)   Thin Liquid Thin Liquid: Within functional limits Presentation: Cup;Self Fed (10+ trials) Other Comments: mild cough noted post 2 consecutive sips via straw -- does not use straws at home per pt report    Nectar Thick Nectar Thick Liquid: Not tested   Honey Thick Honey Thick Liquid: Not tested   Puree Puree: Within functional limits Presentation: Self Fed;Spoon (~4 ozs)   Solid     Solid: Within functional limits Presentation: Self Fed (8 trials) Other Comments: graham crackers moistened        Jerilynn Som, MS, CCC-SLP Speech Language Pathologist Rehab Services; [facility] [phone] (ascom) Jenni Thew 04/26/2023,3:05 PM

## 2023-04-26 NOTE — Assessment & Plan Note (Signed)
Decompensated respiratory status now requiring 2L nasal cannula in the setting of aspiration pneumonia and mild COPD exacerbation Chest x-ray concerning for aspiration event IV Unasyn IV Solu-Medrol DuoNebs Supplemental oxygen

## 2023-04-27 DIAGNOSIS — J690 Pneumonitis due to inhalation of food and vomit: Secondary | ICD-10-CM | POA: Diagnosis not present

## 2023-04-27 LAB — CBC
HCT: 41.4 % (ref 39.0–52.0)
Hemoglobin: 13.2 g/dL (ref 13.0–17.0)
MCH: 30.8 pg (ref 26.0–34.0)
MCHC: 31.9 g/dL (ref 30.0–36.0)
MCV: 96.5 fL (ref 80.0–100.0)
Platelets: 237 10*3/uL (ref 150–400)
RBC: 4.29 MIL/uL (ref 4.22–5.81)
RDW: 14.6 % (ref 11.5–15.5)
WBC: 8.9 10*3/uL (ref 4.0–10.5)
nRBC: 0 % (ref 0.0–0.2)

## 2023-04-27 LAB — BASIC METABOLIC PANEL
Anion gap: 11 (ref 5–15)
BUN: 35 mg/dL — ABNORMAL HIGH (ref 8–23)
CO2: 20 mmol/L — ABNORMAL LOW (ref 22–32)
Calcium: 7.8 mg/dL — ABNORMAL LOW (ref 8.9–10.3)
Chloride: 106 mmol/L (ref 98–111)
Creatinine, Ser: 1.15 mg/dL (ref 0.61–1.24)
GFR, Estimated: >60 mL/min (ref >60)
Glucose, Bld: 249 mg/dL — ABNORMAL HIGH (ref 70–99)
Potassium: 4.4 mmol/L (ref 3.5–5.1)
Sodium: 137 mmol/L (ref 135–145)

## 2023-04-27 LAB — MAGNESIUM: Magnesium: 2.3 mg/dL (ref 1.7–2.4)

## 2023-04-27 LAB — PHOSPHORUS: Phosphorus: 3.6 mg/dL (ref 2.5–4.6)

## 2023-04-27 LAB — HEMOGLOBIN A1C
Hgb A1c MFr Bld: 8.2 % — ABNORMAL HIGH (ref 4.8–5.6)
Mean Plasma Glucose: 188.64 mg/dL

## 2023-04-27 LAB — HIV ANTIBODY (ROUTINE TESTING W REFLEX): HIV Screen 4th Generation wRfx: NONREACTIVE

## 2023-04-27 MED ORDER — IPRATROPIUM-ALBUTEROL 0.5-2.5 (3) MG/3ML IN SOLN
3.0000 mL | Freq: Two times a day (BID) | RESPIRATORY_TRACT | Status: DC
  Filled 2023-04-27 (×2): qty 3

## 2023-04-27 MED ORDER — INSULIN ASPART 100 UNIT/ML IJ SOLN: 0.0000 [IU] | Freq: Three times a day (TID) | INTRAMUSCULAR | Status: DC

## 2023-04-27 MED ORDER — UMECLIDINIUM BROMIDE 62.5 MCG/ACT IN AEPB
1.0000 | INHALATION_SPRAY | Freq: Every day | RESPIRATORY_TRACT | Status: DC
  Administered 2023-04-27: 1 via RESPIRATORY_TRACT
  Filled 2023-04-27: qty 7

## 2023-04-27 MED ORDER — TAMSULOSIN HCL 0.4 MG PO CAPS
0.4000 mg | ORAL_CAPSULE | Freq: Every day | ORAL | Status: DC
  Administered 2023-04-27: 0.4 mg via ORAL
  Filled 2023-04-27: qty 1

## 2023-04-27 NOTE — Progress Notes (Signed)
Speech Language Pathology Treatment: Dysphagia  Patient Details Name: George Hensley MRN: [id number] DOB: [birth year] Today's Date: 04/27/2023 Time: 0855-0920 SLP Time Calculation (min) (ACUTE ONLY): 25 min  Assessment / Plan / Recommendation Clinical Impression  Pt seen for ongoing education and toleration of diet today. Pt awake, verbal and engaged appropriately in conversation w/ this SLP. Pt again denied any swallowing difficulty now or at home prior.  On [place] 2L O2 support, afebrile. WBC WNL.   Pt appears to present w/ functional oropharyngeal phase swallow in setting of declined Pulmonary function/status at Baseline(see H&P) w/ No overt oropharyngeal phase dysphagia noted, No neuromuscular deficits noted. Pt consumed po trials w/ No immediate, overt clinical s/s of aspiration during po trials. Pt appears at reduced risk for aspiration when following general aspiration precautions in setting of his Baseline Pulmonary decline.   During po trials of foods, liquids and Pill swallowing w/ Puree(NSG present), pt consumed all consistencies w/ no immediate, overt coughing, decline in vocal quality, or change in respiratory presentation during/post trials. Oral phase appeared [facility] w/ timely bolus management, mastication of softened solids, and control of bolus propulsion for A-P transfer for swallowing and oral clearing. Pt fed self.  Recommend continue a fairly Regular consistency diet w/ well-Cut meats, moistened foods for ease of eating/conservation of energy in setting of Pulmonary status Baseline; Thin liquids via Cup - NO STRAWS. Recommend general aspiration precautions, tray setup and support at meals as needed. Rest Breaks during meals to lessen any WOB. Pills WHOLE in Puree for safer, easier swallowing -- pt has been taking w/ a banana at home prior as his Baseline. This or w/ a Puree was encouraged.    Education given/practiced on Pills in Puree; food consistencies and easy to eat options;  general aspiration precautions to pt. No further skilled ST services indicated. NSG to reconsult if any new needs arise during admit. Pt agreed. MD/NSG updated, agreed.    HPI HPI: Pt is a 89 y.o. male with medical history significant of asthma, COPD, type 2 diabetes, bronchiectasis, CAD presenting w/ COPD exacerbation, elevated troponin.  Patient reports increased work of breathing cough shortness of breath of 1 to 2 days.  Positive generalized weakness.  Mild wheezing.  No abdominal pain nausea vomiting.  No hemiparesis or confusion.  Chest Imaging: Patchy bibasilar airspace disease superimposed upon background  emphysema. This could reflect infection or aspiration.  2. Trace bilateral pleural effusions.  Previous chest imaging was in 2021: "Emphysema with patchy airspace disease in the left lung, most  prominently in the left upper lobe. Features suggest pneumonia.".      SLP Plan  All goals met      Recommendations for follow up therapy are one component of a multi-disciplinary discharge planning process, led by the attending physician.  Recommendations may be updated based on patient status, additional functional criteria and insurance authorization.    Recommendations  Diet recommendations: Regular;Thin liquid (cut, moistened foods) Liquids provided via: Cup;No straw Medication Administration: Whole meds with puree (baseline at home) Supervision: Patient able to self feed Compensations: Minimize environmental distractions;Slow rate;Small sips/bites;Lingual sweep for clearance of pocketing Postural Changes and/or Swallow Maneuvers: Out of bed for meals;Seated upright 90 degrees;Upright 30-60 min after meal                 (Dietician if needed) Oral care BID;Patient independent with oral care   PRN Dysphagia, unspecified (R13.10) (pulmonary decline at baseline)     All goals met  Jerilynn Som, MS, CCC-SLP Speech Language Pathologist Rehab Services; [facility] [phone] (ascom) George Hensley  04/27/2023, 11:56 AM

## 2023-04-27 NOTE — TOC Initial Note (Signed)
Transition of Care [facility]) - Initial/Assessment Note    Patient Details  Name: George Hensley MRN: [id number] Date of Birth: 1931/03/26  [facility]) CM/SW Contact:    Liliana Cline, LCSW Phone Number: 04/27/2023, 11:53 AM  Clinical Narrative:                 Patient is on airborne isolation. CSW spoke with patient by phone. Patient is from home alone as his spouse is at [facility]. PCP is Dr. Hyacinth Meeker. Has DME at home. Patient states he is agreeable to Home Health. Address is [address] in [place]. Kandee Keen with [facility] the [facility] referral.   Expected Discharge Plan: Home w Home Health Services Barriers to Discharge: Continued Medical Work up   Patient Goals and CMS Choice Patient states their goals for this hospitalization and ongoing recovery are:: home with home health CMS Medicare.gov Compare Post Acute Care list provided to:: Patient Choice offered to / list presented to : Patient      Expected Discharge Plan and Services       Living arrangements for the past 2 months: Single Family Home                                      Prior Living Arrangements/Services Living arrangements for the past 2 months: Single Family Home Lives with:: Spouse Patient language and need for interpreter reviewed:: Yes Do you feel safe going back to the place where you live?: Yes      Need for Family Participation in Patient Care: Yes (Comment) Care giver support system in place?: Yes (comment) Current home services: DME Criminal Activity/Legal Involvement Pertinent to Current Situation/Hospitalization: No - Comment as needed  Activities of Daily Living Home Assistive Devices/Equipment: Walker (specify type) ADL Screening (condition at time of admission) Patient's cognitive ability adequate to safely complete daily activities?: Yes Is the patient deaf or have difficulty hearing?: No Does the patient have difficulty seeing, even when wearing  glasses/contacts?: No Does the patient have difficulty concentrating, remembering, or making decisions?: No Patient able to express need for assistance with ADLs?: Yes Does the patient have difficulty dressing or bathing?: Yes Independently performs ADLs?: No Communication: Independent Dressing (OT): Needs assistance Is this a change from baseline?: Change from baseline, expected to last <3days Grooming: Needs assistance Is this a change from baseline?: Change from baseline, expected to last <3 days Feeding: Independent Bathing: Needs assistance Is this a change from baseline?: Change from baseline, expected to last <3 days Toileting: Needs assistance Is this a change from baseline?: Change from baseline, expected to last <3 days In/Out Bed: Needs assistance Is this a change from baseline?: Change from baseline, expected to last <3 days Walks in Home: Needs assistance Is this a change from baseline?: Change from baseline, expected to last <3 days Does the patient have difficulty walking or climbing stairs?: Yes Weakness of Legs: Both Weakness of Arms/Hands: None  Permission Sought/Granted Permission sought to share information with : Oceanographer granted to share information with : Yes, Verbal Permission Granted     Permission granted to share info w AGENCY: Home Health agencies        Emotional Assessment       Orientation: : Oriented to Self, Oriented to Situation, Oriented to Place, Oriented to  Time Alcohol / Substance Use: Not Applicable Psych Involvement: No (comment)  Admission diagnosis:  Aspiration pneumonia (HCC) [J69.0] Hypoxia [R09.02] Elevated troponin [R79.89] SIRS (systemic inflammatory response syndrome) (HCC) [R65.10] Generalized weakness [R53.1] Aspiration pneumonia, unspecified aspiration pneumonia type, unspecified laterality, unspecified part of lung (HCC) [J69.0] Chronic obstructive pulmonary disease, unspecified COPD  type (HCC) [J44.9] Patient Active Problem List   Diagnosis Date Noted   Aspiration pneumonia (HCC) 04/26/2023   COPD (chronic obstructive pulmonary disease) (HCC) 04/26/2023   Pneumonia due to COVID-19 virus 07/31/2020   COPD exacerbation (HCC) 07/31/2020   Diabetes mellitus without complication (HCC) 07/31/2020   Sepsis (HCC) 07/31/2020   Hypokalemia 07/31/2020   Hypomagnesemia 07/31/2020   Elevated troponin 07/31/2020   NSTEMI (non-ST elevated myocardial infarction) (HCC) 07/31/2020   Bronchiectasis (HCC)    Acute on chronic respiratory failure with hypoxia and hypercapnia (HCC) 05/29/2018   CAP (community acquired pneumonia) 10/12/2017   Dyspnea 06/26/2016   Asthma exacerbation 06/26/2016   Hypoxia 06/12/2016   Acute respiratory failure with hypoxia (HCC) 08/02/2015   Asthma 04/04/2011   Pulmonary nodule 03/06/2011   PCP:  Danella Penton, MD Pharmacy:   [facility] - [place], [place] - [address] [place] [address] Phone: [phone] Fax: [phone]     Social Determinants of Health (SDOH) Social History: SDOH Screenings   Food Insecurity: No Food Insecurity (04/26/2023)  Housing: Low Risk  (04/26/2023)  Transportation Needs: No Transportation Needs (04/26/2023)  Utilities: Not At Risk (04/26/2023)  Financial Resource Strain: Low Risk  (04/10/2023)   Received from [facility]  Tobacco Use: High Risk (04/25/2023)   SDOH Interventions:     Readmission Risk Interventions     No data to display

## 2023-04-27 NOTE — Plan of Care (Signed)

## 2023-04-27 NOTE — Evaluation (Signed)
Occupational Therapy Evaluation Patient Details Name: George Hensley MRN: [id number] DOB: 08/27/1931 Today's Date: 04/27/2023   History of Present Illness Pt is an 89 y.o. male with medical history significant of COPD, type 2 diabetes, and CAD who presented with cough and SOB.  MD assessment includes: aspiration pneumonia, COPD exacerbation, elevated troponin, and acute respiratory failure with hypoxia.   Clinical Impression   George Hensley was able to perform bed mobility, transfers, dressing, toileting, and ambulating in hallway with RW, with Mod I or SUPV. He denied pain today, had no overt LOB, and kept O2 levels at 92% or above on RA. Prior to admission, pt had been IND in B/IADL. He reports he drives; does his own shopping, cooking, cleaning; goes out to eat frequently with friends; and works out a gym 3x/week. Pt is not yet at his baseline level of fxl mobility, but is progressing well. Pt states he would like to participate in PT post DC to help him regain strength and endurance so he can return to his gym ASAP; however, he states he is not interested in any additional OT at present. Will sign off.      If plan is discharge home, recommend the following:     Functional Status Assessment  Patient has had a recent decline in their functional status and demonstrates the ability to make significant improvements in function in a reasonable and predictable amount of time.  Equipment Recommendations  None recommended by OT    Recommendations for Other Services       Precautions / Restrictions Precautions Precautions: Fall Restrictions Weight Bearing Restrictions: No Other Position/Activity Restrictions: watch SpO2      Mobility Bed Mobility Overal bed mobility: Modified Independent                  Transfers Overall transfer level: Needs assistance Equipment used: Rolling walker (2 wheels) Transfers: Sit to/from Stand             General transfer comment: Good  eccentric and concentric control and stability      Balance Overall balance assessment: Needs assistance   Sitting balance-Leahy Scale: Normal     Standing balance support: Bilateral upper extremity supported, During functional activity Standing balance-Leahy Scale: Good                             ADL either performed or assessed with clinical judgement   ADL Overall ADL's : Modified independent                                             Vision         Perception         Praxis         Pertinent Vitals/Pain Pain Assessment Pain Assessment: No/denies pain     Extremity/Trunk Assessment Upper Extremity Assessment Upper Extremity Assessment: Generalized weakness   Lower Extremity Assessment Lower Extremity Assessment: Generalized weakness       Communication Communication Communication: No apparent difficulties   Cognition Arousal: Alert Behavior During Therapy: WFL for tasks assessed/performed Overall Cognitive Status: Within Functional Limits for tasks assessed  General Comments       Exercises     Shoulder Instructions      Home Living Family/patient expects to be discharged to:: Private residence Living Arrangements: Alone Available Help at Discharge: Friend(s);Available PRN/intermittently Type of Home: House Home Access: Level entry     Home Layout: One level     Bathroom Shower/Tub: Producer, television/film/video: Handicapped height     Home Equipment: Agricultural consultant (2 wheels);Grab bars - tub/shower;Grab bars - toilet   Additional Comments: spouse with recent CVA and is at a SNF for rehab      Prior Functioning/Environment Prior Level of Function : Independent/Modified Independent             Mobility Comments: Ind amb community distances but prefers to hold a shopping cart or back of a w/c for longer walks, no fall history ADLs  Comments: Ind with ADLs        OT Problem List: Decreased strength;Decreased activity tolerance;Impaired balance (sitting and/or standing)      OT Treatment/Interventions:      OT Goals(Current goals can be found in the care plan section) Acute Rehab OT Goals Patient Stated Goal: to go home OT Goal Formulation: With patient Time For Goal Achievement: 05/11/23 Potential to Achieve Goals: Good  OT Frequency:      Co-evaluation              AM-PAC OT "6 Clicks" Daily Activity     Outcome Measure Help from another person eating meals?: None Help from another person taking care of personal grooming?: None Help from another person toileting, which includes using toliet, bedpan, or urinal?: A Little Help from another person bathing (including washing, rinsing, drying)?: A Little Help from another person to put on and taking off regular upper body clothing?: None Help from another person to put on and taking off regular lower body clothing?: A Little 6 Click Score: 21   End of Session Equipment Utilized During Treatment: Rolling walker (2 wheels)  Activity Tolerance: Patient tolerated treatment well Patient left: in chair;with chair alarm set;with call bell/phone within reach  OT Visit Diagnosis: Muscle weakness (generalized) (M62.81);Unsteadiness on feet (R26.81)                Time: 9147-8295 OT Time Calculation (min): 17 min Charges:  OT General Charges $OT Visit: 1 Visit OT Evaluation $OT Eval Low Complexity: 1 Low OT Treatments $Self Care/Home Management : 8-22 mins Latina Craver, PhD, MS, OTR/L 04/27/23, 12:27 PM

## 2023-04-27 NOTE — Progress Notes (Signed)
[facility] Progress Note  Patient: George Hensley    VOZ:[id number]  DOA: 04/26/2023     Date of Service: the patient was seen and examined on 04/27/2023  Chief Complaint  Patient presents with   Weakness   Brief hospital course: George Hensley is a 89 y.o. male with medical history significant of COPD, type 2 diabetes, CAD presenting with aspiration pneumonia, COPD exacerbation, elevated troponin.  Patient reports increased work of breathing cough shortness of breath of 1 to 2 days.  Positive generalized weakness.  Mild wheezing.  No abdominal pain nausea vomiting.  No hemiparesis or confusion. Presented to the ER afebrile, hemodynamically stable.  Requiring 2 L nasal cannula to keep O2 sats greater 94%.  White count 16.4, hemoglobin 13.7, platelets 216, troponin 30s to 40s. EKG sinus rhythm w/ PVCs.  Chest x-ray with patchy bibasilar airspace disease superimposed on background of edema concerning for infection aspiration.  Assessment and Plan:  # Aspiration pneumonia Acute upper respiratory failure, required 2 L oxygen via nasal cannula.  Respiratory failure resolved, currently saturating well on room air. Chest x-ray with p atchy bibasilar airspace disease superimposed upon background Emphysema IV Unasyn started in the ER, continue Unasyn for now Blood and respiratory cultures SLP consult Team supplemental oxygen as needed Follow   Acute respiratory failure with hypoxia, resolved   COPD (chronic obstructive pulmonary disease)  Baseline COPD Noted concurrent aspiration pneumonia Does appear to have some degree of wheezing associated with presentation Will start on treatment for overlapping mild COPD exacerbation S/p Solu-Medrol 125 mg IV x 1 dose, followed by prednisone 40 mg p.o. daily for 4 days. Continue IV Unasyn DuoNebs Continue supplemental oxygen Follow     Elevated troponin Troponin 30s to 40s in the setting of aspiration pneumonia, COPD, and acute respiratory  failure with hypoxia Suspect mild min ischemia in the setting of respiratory disease Patient denies any chest pain Monitor   NIDDM type II Held home medications for now Continue NovoLog sliding scale, monitor CBG Continue diabetic diet    Body mass index is 22.24 kg/m.  Nutrition Problem: Increased nutrient needs Etiology: chronic illness (COPD) Interventions: Interventions: Ensure Enlive (each supplement provides 350kcal and 20 grams of protein), MVI   Diet: Carb modified diet DVT Prophylaxis: Subcutaneous Lovenox   Advance goals of care discussion: Full code  Family Communication: family was not present at bedside, at the time of interview.  The pt provided permission to discuss medical plan with the family. Opportunity was given to ask question and all questions were answered satisfactorily.   Disposition:  Pt is from Home, admitted with acute respiratory failure, aspiration pneumonia, COPD exacerbation, still on IV antibiotics, which precludes a safe discharge. Discharge to home, when stable, most likely tomorrow a.m.  Subjective: No significant events overnight, patient's breathing is improving, still does not feel back to his baseline.  Would be more comfortable to go home tomorrow.  Physical Exam: General: NAD, lying comfortably Appear in no distress, affect appropriate Eyes: PERRLA ENT: Oral Mucosa Clear, moist  Neck: no JVD,  Cardiovascular: S1 and S2 Present, no Murmur,  Respiratory: good respiratory effort, Bilateral Air entry equal and Decreased, mild crackles, no significant wheezes Abdomen: Bowel Sound present, Soft and no tenderness,  Skin: no rashes Extremities: no Pedal edema, no calf tenderness Neurologic: without any new focal findings Gait not checked due to patient safety concerns  Vitals:   04/26/23 2335 04/27/23 0335 04/27/23 0810 04/27/23 1206  BP: 128/82 114/67  133/79  Pulse: 83 73  91  Resp: 18 17  18   Temp: 97.6 F (36.4 C) 98 F (36.7  C)  97.7 F (36.5 C)  TempSrc: Oral     SpO2: 91% 95% 99% 97%  Weight:      Height:       No intake or output data in the 24 hours ending 04/27/23 1530 Filed Weights   04/25/23 2106  Weight: 70.3 kg    Data Reviewed: I have personally reviewed and interpreted daily labs, tele strips, imagings as discussed above. I reviewed all nursing notes, pharmacy notes, vitals, pertinent old records I have discussed plan of care as described above with RN and patient/family.  CBC: Recent Labs  Lab 04/25/23 2109 04/27/23 0944  WBC 16.4* 8.9  HGB 13.7 13.2  HCT 42.6 41.4  MCV 94.9 96.5  PLT 216 237   Basic Metabolic Panel: Recent Labs  Lab 04/25/23 2109 04/27/23 0944  NA 134* 137  K 3.8 4.4  CL 101 106  CO2 21* 20*  GLUCOSE 159* 249*  BUN 18 35*  CREATININE 1.09 1.15  CALCIUM 8.4* 7.8*  MG  --  2.3  PHOS  --  3.6    Studies: No results found.  Scheduled Meds:  empagliflozin  25 mg Oral Daily   enoxaparin (LOVENOX) injection  40 mg Subcutaneous Q24H   feeding supplement  237 mL Oral BID BM   fluticasone furoate-vilanterol  1 puff Inhalation Daily   insulin aspart  0-15 Units Subcutaneous TID WC   ipratropium-albuterol  3 mL Nebulization BID   multivitamin with minerals  1 tablet Oral Daily   predniSONE  40 mg Oral Q breakfast   tamsulosin  0.4 mg Oral QPC supper   umeclidinium bromide  1 puff Inhalation Daily   Continuous Infusions:  sodium chloride 75 mL/hr at 04/26/23 1315   ampicillin-sulbactam (UNASYN) IV 3 g (04/27/23 0920)   PRN Meds: albuterol  Time spent: 35 minutes  Author: Gillis Santa. MD [facility] 04/27/2023 3:30 PM  To reach On-call, see care teams to locate the attending and reach out to them via [email or web address]. If 7PM-7AM, please contact night-coverage If you still have difficulty reaching the attending provider, please page the [facility] (Director on Call) for [facility] on amion for assistance.

## 2023-04-28 DIAGNOSIS — J690 Pneumonitis due to inhalation of food and vomit: Secondary | ICD-10-CM | POA: Diagnosis not present

## 2023-04-28 LAB — BASIC METABOLIC PANEL
Anion gap: 7 (ref 5–15)
BUN: 36 mg/dL — ABNORMAL HIGH (ref 8–23)
CO2: 22 mmol/L (ref 22–32)
Calcium: 8 mg/dL — ABNORMAL LOW (ref 8.9–10.3)
Chloride: 108 mmol/L (ref 98–111)
Creatinine, Ser: 1.09 mg/dL (ref 0.61–1.24)
GFR, Estimated: >60 mL/min (ref >60)
Glucose, Bld: 242 mg/dL — ABNORMAL HIGH (ref 70–99)
Potassium: 4.1 mmol/L (ref 3.5–5.1)
Sodium: 137 mmol/L (ref 135–145)

## 2023-04-28 LAB — CBC
HCT: 38.3 % — ABNORMAL LOW (ref 39.0–52.0)
Hemoglobin: 12.4 g/dL — ABNORMAL LOW (ref 13.0–17.0)
MCH: 30.6 pg (ref 26.0–34.0)
MCHC: 32.4 g/dL (ref 30.0–36.0)
MCV: 94.6 fL (ref 80.0–100.0)
Platelets: 252 10*3/uL (ref 150–400)
RBC: 4.05 MIL/uL — ABNORMAL LOW (ref 4.22–5.81)
RDW: 14.5 % (ref 11.5–15.5)
WBC: 12.5 10*3/uL — ABNORMAL HIGH (ref 4.0–10.5)
nRBC: 0 % (ref 0.0–0.2)

## 2023-04-28 LAB — PHOSPHORUS: Phosphorus: 2.4 mg/dL — ABNORMAL LOW (ref 2.5–4.6)

## 2023-04-28 LAB — MAGNESIUM: Magnesium: 2.6 mg/dL — ABNORMAL HIGH (ref 1.7–2.4)

## 2023-04-28 MED ORDER — K PHOS MONO-SOD PHOS DI & MONO 155-852-130 MG PO TABS
500.0000 mg | ORAL_TABLET | Freq: Once | ORAL | Status: DC
  Filled 2023-04-28: qty 2

## 2023-04-28 MED ORDER — AMOXICILLIN-POT CLAVULANATE 875-125 MG PO TABS: 1.0000 | ORAL_TABLET | Freq: Two times a day (BID) | ORAL | 0 refills | Status: AC

## 2023-04-28 MED ORDER — PREDNISONE 20 MG PO TABS: 40.0000 mg | ORAL_TABLET | Freq: Every day | ORAL | 0 refills | Status: AC

## 2023-04-28 NOTE — Plan of Care (Signed)

## 2023-04-28 NOTE — Discharge Summary (Signed)
[facility] Discharge Summary   Patient: George Hensley ZOX:[id number]  PCP: Danella Penton, MD  Date of admission: 04/26/2023   Date of discharge:  04/28/2023     Discharge Diagnoses:  Principal Problem:   Aspiration pneumonia (HCC) Active Problems:   COPD (chronic obstructive pulmonary disease) (HCC)   Acute respiratory failure with hypoxia (HCC)   Diabetes mellitus without complication (HCC)   Elevated troponin   Admitted From: Home Disposition:  Home with HH  Recommendations for Outpatient Follow-up:  PCP: in 1 wk Follow up LABS/TEST:     Diet recommendation: Cardiac and Carb modified diet  Activity: The patient is advised to gradually reintroduce usual activities, as tolerated  Discharge Condition: stable  Code Status: Full code   History of present illness: As per the H and P dictated on admission Hospital Course:  [name] is a 89 y.o. male with medical history significant of COPD, type 2 diabetes, CAD presenting with aspiration pneumonia, COPD exacerbation, elevated troponin.  Patient reports increased work of breathing cough shortness of breath of 1 to 2 days.  Positive generalized weakness.  Mild wheezing.  No abdominal pain nausea vomiting.  No hemiparesis or confusion. Presented to the ER afebrile, hemodynamically stable.  Requiring 2 L nasal cannula to keep O2 sats greater 94%.  White count 16.4, hemoglobin 13.7, platelets 216, troponin 30s to 40s. EKG sinus rhythm w/ PVCs.  Chest x-ray with patchy bibasilar airspace disease superimposed on background of edema concerning for infection aspiration.  Assessment and Plan: # Aspiration pneumonia Acute hypoxic respiratory failure, required 2 L oxygen via nasal cannula.  Respiratory failure resolved, currently saturating well on room air. Chest x-ray with p atchy bibasilar airspace disease superimposed upon background Emphysema. S/p IV Unasyn started in the ER, which was continued during hospital stay.  Blood  culture NGTD.  Patient remained afebrile, symptoms improved.  Patient was discharged on Augmentin twice daily for 5 days.  Follow-up with PCP and repeat chest x-ray after 4 weeks. # Acute respiratory failure with hypoxia, resolved   COPD (chronic obstructive pulmonary disease)  Baseline COPD, Noted concurrent aspiration pneumonia. Does appear to have some degree of wheezing associated with presentation. S/p Solu-Medrol 125 mg IV x 1 dose, followed by prednisone 40 mg p.o. daily.  Patient was discharged on prednisone 40 mg p.o. daily for 3 days.  Continued home inhalers.  Saturating well on room air.  Recommended to follow with PCP in 1 week. # Elevated troponin: Troponin 30s to 40s in the setting of aspiration pneumonia, COPD, and acute respiratory failure with hypoxia. Suspect mild min ischemia in the setting of respiratory disease Patient denies any chest pain. # NIDDM type II, s/p NovoLog sliding scale during hospital stay.  Resumed home medications on discharge.  Advised to continue diabetic diet, monitor CBG at home.   Body mass index is 22.24 kg/m.  Nutrition Problem: Increased nutrient needs Etiology: chronic illness (COPD) Nutrition Interventions: Interventions: Ensure Enlive (each supplement provides 350kcal and 20 grams of protein), MVI  Patient was ambulatory without any assistance. On the day of the discharge the patient's vitals were stable, and no other acute medical condition were reported by patient. the patient was felt safe to be discharge at Home with Home health.  Consultants: none Procedures: none  Discharge Exam: General: Appear in no distress, no Rash; Oral Mucosa Clear, moist. Cardiovascular: S1 and S2 Present, no Murmur, Respiratory: normal respiratory effort, Bilateral Air entry present and no Crackles, no wheezes Abdomen:  Bowel Sound present, Soft and no tenderness, no hernia Extremities: no Pedal edema, no calf tenderness Neurology: alert and oriented to time,  place, and person affect appropriate.  Filed Weights   04/25/23 2106  Weight: 70.3 kg   Vitals:   04/27/23 2000 04/27/23 2328  BP: 134/77 115/63  Pulse: 81 72  Resp: 16 17  Temp: 97.8 F (36.6 C) 97.8 F (36.6 C)  SpO2: 93% 90%    DISCHARGE MEDICATION: Allergies as of 04/28/2023       Reactions   Metoprolol    Pt states syncope with lopressor        Medication List     TAKE these medications    albuterol 108 (90 Base) MCG/ACT inhaler Commonly known as: VENTOLIN HFA Inhale 1-2 puffs into the lungs every 6 (six) hours as needed for wheezing or shortness of breath.   amoxicillin-clavulanate 875-125 MG tablet Commonly known as: AUGMENTIN Take 1 tablet by mouth 2 (two) times daily for 5 days.   aspirin EC 81 MG tablet Take 81 mg by mouth daily.   diltiazem 120 MG 24 hr capsule Commonly known as: DILACOR XR Take 120 mg by mouth daily.   Fluticasone-Umeclidin-Vilant 100-62.5-25 MCG/ACT Aepb Inhale 1 puff into the lungs daily.   glipiZIDE 10 MG 24 hr tablet Commonly known as: GLUCOTROL XL Take 10 mg by mouth daily.   hydrOXYzine 25 MG tablet Commonly known as: ATARAX Take 25 mg by mouth 2 (two) times daily as needed for itching.   Jardiance 25 MG Tabs tablet Generic drug: empagliflozin Take 25 mg by mouth daily.   montelukast 10 MG tablet Commonly known as: SINGULAIR Take 1 tablet by mouth daily.   predniSONE 20 MG tablet Commonly known as: DELTASONE Take 2 tablets (40 mg total) by mouth daily with breakfast for 3 days. Start taking on: April 29, 2023   tamsulosin 0.4 MG Caps capsule Commonly known as: FLOMAX Take 0.4 mg by mouth daily.       Allergies  Allergen Reactions   Metoprolol     Pt states syncope with lopressor   Discharge Instructions     Call MD for:  difficulty breathing, headache or visual disturbances   Complete by: As directed    Call MD for:  extreme fatigue   Complete by: As directed    Call MD for:  persistant  dizziness or light-headedness   Complete by: As directed    Call MD for:  temperature >100.4   Complete by: As directed    Diet - low sodium heart healthy   Complete by: As directed    Discharge instructions   Complete by: As directed    F/u PCP in 1 wk, CXR in 4 wks   Increase activity slowly   Complete by: As directed        The results of significant diagnostics from this hospitalization (including imaging, microbiology, ancillary and laboratory) are listed below for reference.    Significant Diagnostic Studies: DG Chest 2 View  Result Date: 04/25/2023 CLINICAL DATA:  Hypoxia, weakness EXAM: CHEST - 2 VIEW COMPARISON:  08/06/2020 FINDINGS: Frontal and lateral views of the chest demonstrate an unremarkable cardiac silhouette. Continued background emphysema. There is patchy bibasilar airspace disease, greatest in the left lower lobe. Trace bilateral effusions. No pneumothorax. No acute bony abnormalities. IMPRESSION: 1. Patchy bibasilar airspace disease superimposed upon background emphysema. This could reflect infection or aspiration. 2. Trace bilateral pleural effusions. Electronically Signed   By: Maxwell Caul.D.  On: 04/25/2023 21:50   MR PROSTATE W WO CONTRAST  Result Date: 04/23/2023 CLINICAL DATA:  Elevated PSA level of 10.65.  R97.20 EXAM: MR PROSTATE WITHOUT AND WITH CONTRAST TECHNIQUE: Multiplanar multisequence MRI images were obtained of the pelvis centered about the prostate. Pre and post contrast images were obtained. CONTRAST:  7mL GADAVIST GADOBUTROL 1 MMOL/ML IV SOLN COMPARISON:  CT pelvis 08/08/2018 FINDINGS: Prostate: Encapsulated nodularity in the transition zone compatible with benign prostatic hypertrophy. Region of interest # 1:a PI-RADS category 4 lesion of the right anterior transition zone in the mid gland with somewhat ill-defined focal reduced T2 signal (image 46 of series 9) corresponding to reduced ADC map activity and restricted diffusion (image 13 of  series 7 and 8). This measures 0.38 cc (1.4 by 0.9 by 0.5 cm). Region of interest # 2: PI-RADS category 3 lesion of the right posterolateral peripheral zone with focally reduced T2 signal (image 56 of series 9) corresponding to focally reduced ADC map activity (image 16, series 7). This measures 0.37 cc (0.9 by 0.4 by 1.0 cm). Volume: 3D volumetric analysis: Prostate volume 44.86 cc (5.2 by 4.7 by 4.1 cm). Transcapsular spread:  Absent Seminal vesicle involvement: Absent Neurovascular bundle involvement: Absent Pelvic adenopathy: Absent Bone metastasis: Absent Other findings: Mild sigmoid colon diverticulosis. Suspected chronic bilateral pars defects at L5. IMPRESSION: 1. PI-RADS category 4 lesion of the right anterior transition zone in the mid gland. PI-RADS category 3 lesion of the right posterolateral peripheral zone. 2. Mild prostatomegaly and benign prostatic hypertrophy. 3. Mild sigmoid colon diverticulosis. 4. Suspected chronic bilateral pars defects at L5. Electronically Signed   By: Gaylyn Rong M.D.   On: 04/23/2023 07:22    Microbiology: Recent Results (from the past 240 hour(s))  SARS Coronavirus 2 by RT PCR (hospital order, performed in [facility] hospital lab) *cepheid single result test* Anterior Nasal Swab     Status: None   Collection Time: 04/25/23  9:20 PM   Specimen: Anterior Nasal Swab  Result Value Ref Range Status   SARS Coronavirus 2 by RT PCR NEGATIVE NEGATIVE Final    Comment: Performed at [facility], [address] [place], [place] [address]  Culture, blood (routine x 2)     Status: None (Preliminary result)   Collection Time: 04/26/23  1:02 AM   Specimen: BLOOD  Result Value Ref Range Status   Specimen Description BLOOD RIGHT ASSIST CONTROL  Final   Special Requests   Final    BOTTLES DRAWN AEROBIC AND ANAEROBIC Blood Culture results may not be optimal due to an excessive volume of blood received in culture bottles   Culture   Final    NO GROWTH 2  DAYS Performed at [facility], [address], [place], [place] [address]    Report Status PENDING  Incomplete  Culture, blood (routine x 2)     Status: None (Preliminary result)   Collection Time: 04/26/23  5:17 AM   Specimen: BLOOD LEFT ARM  Result Value Ref Range Status   Specimen Description BLOOD LEFT ARM  Final   Special Requests   Final    BOTTLES DRAWN AEROBIC AND ANAEROBIC Blood Culture adequate volume   Culture   Final    NO GROWTH 2 DAYS Performed at [facility], [address], [place], [place] [address]    Report Status PENDING  Incomplete     Labs: CBC: Recent Labs  Lab 04/25/23 2109 04/27/23 0944 04/28/23 0613  WBC 16.4* 8.9 12.5*  HGB 13.7 13.2 12.4*  HCT 42.6 41.4 38.3*  MCV 94.9 96.5 94.6  PLT 216 237 252   Basic Metabolic Panel: Recent Labs  Lab 04/25/23 2109 04/27/23 0944 04/28/23 0613  NA 134* 137 137  K 3.8 4.4 4.1  CL 101 106 108  CO2 21* 20* 22  GLUCOSE 159* 249* 242*  BUN 18 35* 36*  CREATININE 1.09 1.15 1.09  CALCIUM 8.4* 7.8* 8.0*  MG  --  2.3 2.6*  PHOS  --  3.6 2.4*   Liver Function Tests: No results for input(s): "AST", "ALT", "ALKPHOS", "BILITOT", "PROT", "ALBUMIN" in the last 168 hours. No results for input(s): "LIPASE", "AMYLASE" in the last 168 hours. No results for input(s): "AMMONIA" in the last 168 hours. Cardiac Enzymes: No results for input(s): "CKTOTAL", "CKMB", "CKMBINDEX", "TROPONINI" in the last 168 hours. BNP (last 3 results) No results for input(s): "BNP" in the last 8760 hours. CBG: No results for input(s): "GLUCAP" in the last 168 hours.  Time spent: 35 minutes  Signed:  Gillis Santa  [facility] 04/28/2023 10:55 AM

## 2023-05-01 LAB — CULTURE, BLOOD (ROUTINE X 2)
Culture: NO GROWTH
Culture: NO GROWTH
Special Requests: ADEQUATE

## 2023-09-29 ENCOUNTER — Other Ambulatory Visit: Payer: Self-pay

## 2023-09-29 ENCOUNTER — Emergency Department: Payer: No Typology Code available for payment source

## 2023-09-29 DIAGNOSIS — I4510 Unspecified right bundle-branch block: Secondary | ICD-10-CM | POA: Diagnosis present

## 2023-09-29 DIAGNOSIS — J219 Acute bronchiolitis, unspecified: Secondary | ICD-10-CM | POA: Diagnosis not present

## 2023-09-29 DIAGNOSIS — J189 Pneumonia, unspecified organism: Secondary | ICD-10-CM | POA: Diagnosis present

## 2023-09-29 DIAGNOSIS — Z1152 Encounter for screening for COVID-19: Secondary | ICD-10-CM

## 2023-09-29 DIAGNOSIS — I2510 Atherosclerotic heart disease of native coronary artery without angina pectoris: Secondary | ICD-10-CM | POA: Diagnosis present

## 2023-09-29 DIAGNOSIS — Z79899 Other long term (current) drug therapy: Secondary | ICD-10-CM

## 2023-09-29 DIAGNOSIS — J690 Pneumonitis due to inhalation of food and vomit: Secondary | ICD-10-CM | POA: Diagnosis not present

## 2023-09-29 DIAGNOSIS — Z7982 Long term (current) use of aspirin: Secondary | ICD-10-CM

## 2023-09-29 DIAGNOSIS — Z955 Presence of coronary angioplasty implant and graft: Secondary | ICD-10-CM

## 2023-09-29 DIAGNOSIS — J440 Chronic obstructive pulmonary disease with acute lower respiratory infection: Secondary | ICD-10-CM | POA: Diagnosis present

## 2023-09-29 DIAGNOSIS — J9601 Acute respiratory failure with hypoxia: Secondary | ICD-10-CM | POA: Diagnosis present

## 2023-09-29 DIAGNOSIS — Z9049 Acquired absence of other specified parts of digestive tract: Secondary | ICD-10-CM

## 2023-09-29 DIAGNOSIS — E119 Type 2 diabetes mellitus without complications: Secondary | ICD-10-CM | POA: Diagnosis present

## 2023-09-29 DIAGNOSIS — Z87891 Personal history of nicotine dependence: Secondary | ICD-10-CM

## 2023-09-29 DIAGNOSIS — Z7984 Long term (current) use of oral hypoglycemic drugs: Secondary | ICD-10-CM

## 2023-09-29 DIAGNOSIS — Z825 Family history of asthma and other chronic lower respiratory diseases: Secondary | ICD-10-CM

## 2023-09-29 DIAGNOSIS — J470 Bronchiectasis with acute lower respiratory infection: Secondary | ICD-10-CM | POA: Diagnosis present

## 2023-09-29 DIAGNOSIS — I5022 Chronic systolic (congestive) heart failure: Secondary | ICD-10-CM | POA: Diagnosis present

## 2023-09-29 LAB — BASIC METABOLIC PANEL
Anion gap: 11 (ref 5–15)
BUN: 24 mg/dL — ABNORMAL HIGH (ref 8–23)
CO2: 23 mmol/L (ref 22–32)
Calcium: 8.4 mg/dL — ABNORMAL LOW (ref 8.9–10.3)
Chloride: 103 mmol/L (ref 98–111)
Creatinine, Ser: 1.03 mg/dL (ref 0.61–1.24)
GFR, Estimated: >60 mL/min (ref >60)
Glucose, Bld: 171 mg/dL — ABNORMAL HIGH (ref 70–99)
Potassium: 4.4 mmol/L (ref 3.5–5.1)
Sodium: 137 mmol/L (ref 135–145)

## 2023-09-29 LAB — RESP PANEL BY RT-PCR (RSV, FLU A&B, COVID)  RVPGX2
Influenza A by PCR: NEGATIVE
Influenza B by PCR: NEGATIVE
Resp Syncytial Virus by PCR: NEGATIVE
SARS Coronavirus 2 by RT PCR: NEGATIVE

## 2023-09-29 LAB — CBC
HCT: 41.1 % (ref 39.0–52.0)
Hemoglobin: 13.4 g/dL (ref 13.0–17.0)
MCH: 30.5 pg (ref 26.0–34.0)
MCHC: 32.6 g/dL (ref 30.0–36.0)
MCV: 93.4 fL (ref 80.0–100.0)
Platelets: 197 10*3/uL (ref 150–400)
RBC: 4.4 MIL/uL (ref 4.22–5.81)
RDW: 14.6 % (ref 11.5–15.5)
WBC: 17.4 10*3/uL — ABNORMAL HIGH (ref 4.0–10.5)
nRBC: 0 % (ref 0.0–0.2)

## 2023-09-29 NOTE — ED Triage Notes (Signed)
Pt sts that he has been feeling unwell with fatigue and undescribable feelings.

## 2023-09-29 NOTE — ED Triage Notes (Signed)
Pt presents via EMS from home c/o nausea and vomiting starting today with some weakness and noproductive cough. EMS reports pt denies pain.   CBG 204 temp 100.4 97% on RA. 146/72   18G left FA. 4mg  IV Zofran given by EMS.

## 2023-09-30 ENCOUNTER — Inpatient Hospital Stay
Admission: EM | Admit: 2023-09-30 | Discharge: 2023-10-01 | DRG: 177 | Disposition: A | Discharge status: Home Health | Payer: No Typology Code available for payment source | Attending: Internal Medicine | Admitting: Internal Medicine

## 2023-09-30 DIAGNOSIS — A419 Sepsis, unspecified organism: Principal | ICD-10-CM

## 2023-09-30 DIAGNOSIS — J219 Acute bronchiolitis, unspecified: Secondary | ICD-10-CM | POA: Diagnosis present

## 2023-09-30 DIAGNOSIS — J440 Chronic obstructive pulmonary disease with acute lower respiratory infection: Secondary | ICD-10-CM | POA: Diagnosis present

## 2023-09-30 DIAGNOSIS — J690 Pneumonitis due to inhalation of food and vomit: Secondary | ICD-10-CM | POA: Diagnosis present

## 2023-09-30 DIAGNOSIS — I2510 Atherosclerotic heart disease of native coronary artery without angina pectoris: Secondary | ICD-10-CM | POA: Diagnosis present

## 2023-09-30 DIAGNOSIS — Z7982 Long term (current) use of aspirin: Secondary | ICD-10-CM | POA: Diagnosis not present

## 2023-09-30 DIAGNOSIS — J189 Pneumonia, unspecified organism: Secondary | ICD-10-CM | POA: Diagnosis present

## 2023-09-30 DIAGNOSIS — J470 Bronchiectasis with acute lower respiratory infection: Secondary | ICD-10-CM | POA: Diagnosis present

## 2023-09-30 DIAGNOSIS — I5020 Unspecified systolic (congestive) heart failure: Secondary | ICD-10-CM | POA: Diagnosis not present

## 2023-09-30 DIAGNOSIS — Z9049 Acquired absence of other specified parts of digestive tract: Secondary | ICD-10-CM | POA: Diagnosis not present

## 2023-09-30 DIAGNOSIS — Z825 Family history of asthma and other chronic lower respiratory diseases: Secondary | ICD-10-CM | POA: Diagnosis not present

## 2023-09-30 DIAGNOSIS — Z7984 Long term (current) use of oral hypoglycemic drugs: Secondary | ICD-10-CM | POA: Diagnosis not present

## 2023-09-30 DIAGNOSIS — E119 Type 2 diabetes mellitus without complications: Secondary | ICD-10-CM | POA: Diagnosis present

## 2023-09-30 DIAGNOSIS — J449 Chronic obstructive pulmonary disease, unspecified: Secondary | ICD-10-CM | POA: Diagnosis present

## 2023-09-30 DIAGNOSIS — Z955 Presence of coronary angioplasty implant and graft: Secondary | ICD-10-CM | POA: Diagnosis not present

## 2023-09-30 DIAGNOSIS — I4510 Unspecified right bundle-branch block: Secondary | ICD-10-CM | POA: Diagnosis present

## 2023-09-30 DIAGNOSIS — I5022 Chronic systolic (congestive) heart failure: Secondary | ICD-10-CM | POA: Diagnosis present

## 2023-09-30 DIAGNOSIS — Z79899 Other long term (current) drug therapy: Secondary | ICD-10-CM | POA: Diagnosis not present

## 2023-09-30 DIAGNOSIS — Z87891 Personal history of nicotine dependence: Secondary | ICD-10-CM | POA: Diagnosis not present

## 2023-09-30 DIAGNOSIS — J9601 Acute respiratory failure with hypoxia: Secondary | ICD-10-CM | POA: Diagnosis present

## 2023-09-30 DIAGNOSIS — Z1152 Encounter for screening for COVID-19: Secondary | ICD-10-CM | POA: Diagnosis not present

## 2023-09-30 LAB — CBC
HCT: 37.2 % — ABNORMAL LOW (ref 39.0–52.0)
Hemoglobin: 11.6 g/dL — ABNORMAL LOW (ref 13.0–17.0)
MCH: 30.8 pg (ref 26.0–34.0)
MCHC: 31.2 g/dL (ref 30.0–36.0)
MCV: 98.7 fL (ref 80.0–100.0)
Platelets: 146 10*3/uL — ABNORMAL LOW (ref 150–400)
RBC: 3.77 MIL/uL — ABNORMAL LOW (ref 4.22–5.81)
RDW: 14.7 % (ref 11.5–15.5)
WBC: 17.6 10*3/uL — ABNORMAL HIGH (ref 4.0–10.5)
nRBC: 0 % (ref 0.0–0.2)

## 2023-09-30 LAB — BASIC METABOLIC PANEL
Anion gap: 6 (ref 5–15)
BUN: 24 mg/dL — ABNORMAL HIGH (ref 8–23)
CO2: 22 mmol/L (ref 22–32)
Calcium: 7.1 mg/dL — ABNORMAL LOW (ref 8.9–10.3)
Chloride: 109 mmol/L (ref 98–111)
Creatinine, Ser: 0.97 mg/dL (ref 0.61–1.24)
GFR, Estimated: >60 mL/min (ref >60)
Glucose, Bld: 145 mg/dL — ABNORMAL HIGH (ref 70–99)
Potassium: 4.2 mmol/L (ref 3.5–5.1)
Sodium: 137 mmol/L (ref 135–145)

## 2023-09-30 LAB — CBG MONITORING, ED
Glucose-Capillary: 158 mg/dL — ABNORMAL HIGH (ref 70–99)
Glucose-Capillary: 101 mg/dL — ABNORMAL HIGH (ref 70–99)
Glucose-Capillary: 159 mg/dL — ABNORMAL HIGH (ref 70–99)
Glucose-Capillary: 272 mg/dL — ABNORMAL HIGH (ref 70–99)
Glucose-Capillary: 175 mg/dL — ABNORMAL HIGH (ref 70–99)

## 2023-09-30 LAB — LACTIC ACID, PLASMA: Lactic Acid, Venous: 0.9 mmol/L (ref 0.5–1.9)

## 2023-09-30 MED ORDER — ENOXAPARIN SODIUM 40 MG/0.4ML IJ SOSY
40.0000 mg | PREFILLED_SYRINGE | INTRAMUSCULAR | Status: DC
  Administered 2023-09-30 – 2023-10-01 (×2): 40 mg via SUBCUTANEOUS
  Filled 2023-09-30 (×2): qty 0.4

## 2023-09-30 MED ORDER — SODIUM CHLORIDE 0.9 % IV SOLN
500.0000 mg | Freq: Once | INTRAVENOUS | Status: AC
  Administered 2023-09-30: 500 mg via INTRAVENOUS
  Filled 2023-09-30: qty 5

## 2023-09-30 MED ORDER — TAMSULOSIN HCL 0.4 MG PO CAPS
0.4000 mg | ORAL_CAPSULE | Freq: Every day | ORAL | Status: DC
  Administered 2023-09-30 – 2023-10-01 (×2): 0.4 mg via ORAL
  Filled 2023-09-30 (×2): qty 1

## 2023-09-30 MED ORDER — ALBUTEROL SULFATE (2.5 MG/3ML) 0.083% IN NEBU: 2.5000 mg | INHALATION_SOLUTION | RESPIRATORY_TRACT | Status: DC | PRN

## 2023-09-30 MED ORDER — ACETAMINOPHEN 325 MG PO TABS: 650.0000 mg | ORAL_TABLET | Freq: Four times a day (QID) | ORAL | Status: DC | PRN

## 2023-09-30 MED ORDER — ONDANSETRON HCL 4 MG/2ML IJ SOLN: 4.0000 mg | Freq: Four times a day (QID) | INTRAMUSCULAR | Status: DC | PRN

## 2023-09-30 MED ORDER — IPRATROPIUM-ALBUTEROL 0.5-2.5 (3) MG/3ML IN SOLN
3.0000 mL | Freq: Four times a day (QID) | RESPIRATORY_TRACT | Status: DC
  Administered 2023-09-30 – 2023-10-01 (×7): 3 mL via RESPIRATORY_TRACT
  Filled 2023-09-30 (×7): qty 3

## 2023-09-30 MED ORDER — ONDANSETRON HCL 4 MG PO TABS: 4.0000 mg | ORAL_TABLET | Freq: Four times a day (QID) | ORAL | Status: DC | PRN

## 2023-09-30 MED ORDER — ASPIRIN 81 MG PO TBEC
81.0000 mg | DELAYED_RELEASE_TABLET | Freq: Every day | ORAL | Status: DC
  Administered 2023-09-30 – 2023-10-01 (×2): 81 mg via ORAL
  Filled 2023-09-30 (×2): qty 1

## 2023-09-30 MED ORDER — SODIUM CHLORIDE 0.9 % IV SOLN
2.0000 g | INTRAVENOUS | Status: DC
  Administered 2023-10-01: 2 g via INTRAVENOUS
  Filled 2023-09-30 (×2): qty 20

## 2023-09-30 MED ORDER — INSULIN ASPART 100 UNIT/ML IJ SOLN
0.0000 [IU] | INTRAMUSCULAR | Status: DC
  Administered 2023-09-30: 3 [IU] via SUBCUTANEOUS
  Administered 2023-09-30: 8 [IU] via SUBCUTANEOUS
  Administered 2023-10-01: 5 [IU] via SUBCUTANEOUS
  Filled 2023-09-30 (×2): qty 1

## 2023-09-30 MED ORDER — PREDNISONE 20 MG PO TABS
40.0000 mg | ORAL_TABLET | Freq: Every day | ORAL | Status: DC
  Administered 2023-09-30 – 2023-10-01 (×2): 40 mg via ORAL
  Filled 2023-09-30 (×2): qty 2

## 2023-09-30 MED ORDER — ACETAMINOPHEN 650 MG RE SUPP
650.0000 mg | Freq: Four times a day (QID) | RECTAL | Status: DC | PRN
Start: 2023-09-30 — End: 2023-10-01

## 2023-09-30 MED ORDER — SODIUM CHLORIDE 0.9 % IV SOLN
500.0000 mg | INTRAVENOUS | Status: DC
  Administered 2023-10-01: 500 mg via INTRAVENOUS
  Filled 2023-09-30 (×2): qty 5

## 2023-09-30 MED ORDER — LACTATED RINGERS IV BOLUS (SEPSIS)
1000.0000 mL | Freq: Once | INTRAVENOUS | Status: AC
  Administered 2023-09-30: 1000 mL via INTRAVENOUS

## 2023-09-30 MED ORDER — SODIUM CHLORIDE 0.9 % IV SOLN
2.0000 g | Freq: Once | INTRAVENOUS | Status: AC
  Administered 2023-09-30: 2 g via INTRAVENOUS
  Filled 2023-09-30: qty 20

## 2023-09-30 NOTE — Progress Notes (Signed)
CODE SEPSIS - PHARMACY COMMUNICATION  **Broad Spectrum Antibiotics should be administered within 1 hour of Sepsis diagnosis**  Time Code Sepsis Called/Page Received: 2/3 @ 0109   Antibiotics Ordered: Ceftriaxone, Azithromycin   Time of 1st antibiotic administration: Ceftriaxone 2 gm IV X 1 on 2/3 @ 0140  Additional action taken by pharmacy:   If necessary, Name of Provider/Nurse Contacted:     Chaniah Cisse D ,PharmD Clinical Pharmacist  09/30/2023  2:46 AM

## 2023-09-30 NOTE — Assessment & Plan Note (Signed)
 Sliding scale insulin coverage

## 2023-09-30 NOTE — Sepsis Progress Note (Signed)
 Following for sepsis monitoring ?

## 2023-09-30 NOTE — ED Notes (Signed)
 SLP at bedside.

## 2023-09-30 NOTE — H&P (Signed)
History and Physical    Patient: George Hensley:[id number] DOB: [birth year] DOA: 09/30/2023 DOS: the patient was seen and examined on 09/30/2023 PCP: Danella Penton, MD  Patient coming from: Home  Chief Complaint:  Chief Complaint  Patient presents with   Fatigue    HPI: George Hensley is a 89 y.o. male with medical history significant for COPD on as needed oxygen, bronchiectasis, type 2 diabetes, HFrEF, EF 30 to 35%, CAD with prior stenting, hospitalized September 2024 with aspiration pneumonia being admitted with possible aspiration pneumonia after presenting with cough congestion and shortness of breath that started on the day of arrival.  He was febrile with EMS to 100.4.  He denies chest pain ED course: Temperature on arrival 99.4 with pulse 99 O2 sat 95% on room air.  Workup reveals WBC 17,000, respiratory viral panel negative, BMP unremarkable, lactic acid pending. EKG, personally viewed and interpreted showing NSR at 83 and RBBB and no ischemic changes Chest x-ray showing findings compatible with bronchiolitis or aspiration. Patient started on Rocephin and azithromycin and given an LR bolus Hospitalist consulted for admission.   Review of Systems: As mentioned in the history of present illness. All other systems reviewed and are negative.  Past Medical History:  Diagnosis Date   Asthma    Bronchiectasis (HCC)    COPD (chronic obstructive pulmonary disease) (HCC)    Diabetes mellitus    Past Surgical History:  Procedure Laterality Date   CHOLECYSTECTOMY  2000   CORONARY STENT PLACEMENT  1999   Social History:  reports that he has never smoked. He has quit using smokeless tobacco.  His smokeless tobacco use included chew. He reports current alcohol use. He reports that he does not use drugs.  Allergies  Allergen Reactions   Metoprolol     Pt states syncope with lopressor    Family History  Problem Relation Age of Onset   Asthma Brother     Prior to Admission  medications   Medication Sig Start Date End Date Taking? Authorizing Provider  albuterol (VENTOLIN HFA) 108 (90 Base) MCG/ACT inhaler Inhale 1-2 puffs into the lungs every 6 (six) hours as needed for wheezing or shortness of breath.    [provider]  aspirin EC 81 MG tablet Take 81 mg by mouth daily.     [provider]  diltiazem (DILACOR XR) 120 MG 24 hr capsule Take 120 mg by mouth daily.     [provider]  empagliflozin (JARDIANCE) 25 MG TABS tablet Take 25 mg by mouth daily.    [provider]  Fluticasone-Umeclidin-Vilant 100-62.5-25 MCG/ACT AEPB Inhale 1 puff into the lungs daily.    [provider]  glipiZIDE (GLUCOTROL XL) 10 MG 24 hr tablet Take 10 mg by mouth daily.    [provider]  hydrOXYzine (ATARAX) 25 MG tablet Take 25 mg by mouth 2 (two) times daily as needed for itching.    [provider]  montelukast (SINGULAIR) 10 MG tablet Take 1 tablet by mouth daily.    [provider]  tamsulosin (FLOMAX) 0.4 MG CAPS capsule Take 0.4 mg by mouth daily.    [provider]    Physical Exam: Vitals:   09/29/23 2043 09/29/23 2047  BP: 124/67   Pulse: 99   Resp: 18   Temp: 99.4 F (37.4 C)   TempSrc: Oral   SpO2: 95%   Weight:  72.6 kg  Height:  5\' 8"  (1.727 m)   Physical Exam  Vitals and nursing note reviewed.  Constitutional:      General: He is not in acute distress. HENT:     Head: Normocephalic and atraumatic.  Cardiovascular:     Rate and Rhythm: Normal rate and regular rhythm.     Heart sounds: Normal heart sounds.  Pulmonary:     Effort: Pulmonary effort is normal.     Comments: Coarse wheezes Abdominal:     Palpations: Abdomen is soft.     Tenderness: There is no abdominal tenderness.     Labs on Admission: I have personally reviewed following labs and imaging studies  CBC: Recent Labs  Lab 09/29/23 2049  WBC 17.4*  HGB 13.4  HCT 41.1  MCV 93.4  PLT 197   Basic  Metabolic Panel: Recent Labs  Lab 09/29/23 2049  NA 137  K 4.4  CL 103  CO2 23  GLUCOSE 171*  BUN 24*  CREATININE 1.03  CALCIUM 8.4*   GFR: Estimated Creatinine Clearance: 46.1 mL/min (by C-G formula based on SCr of 1.03 mg/dL). Liver Function Tests: No results for input(s): "AST", "ALT", "ALKPHOS", "BILITOT", "PROT", "ALBUMIN" in the last 168 hours. No results for input(s): "LIPASE", "AMYLASE" in the last 168 hours. No results for input(s): "AMMONIA" in the last 168 hours. Coagulation Profile: No results for input(s): "INR", "PROTIME" in the last 168 hours. Cardiac Enzymes: No results for input(s): "CKTOTAL", "CKMB", "CKMBINDEX", "TROPONINI" in the last 168 hours. BNP (last 3 results) No results for input(s): "PROBNP" in the last 8760 hours. HbA1C: No results for input(s): "HGBA1C" in the last 72 hours. CBG: No results for input(s): "GLUCAP" in the last 168 hours. Lipid Profile: No results for input(s): "CHOL", "HDL", "LDLCALC", "TRIG", "CHOLHDL", "LDLDIRECT" in the last 72 hours. Thyroid Function Tests: No results for input(s): "TSH", "T4TOTAL", "FREET4", "T3FREE", "THYROIDAB" in the last 72 hours. Anemia Panel: No results for input(s): "VITAMINB12", "FOLATE", "FERRITIN", "TIBC", "IRON", "RETICCTPCT" in the last 72 hours. Urine analysis:    Component Value Date/Time   COLORURINE YELLOW (A) 04/26/2023 0517   APPEARANCEUR HAZY (A) 04/26/2023 0517   APPEARANCEUR Cloudy 05/16/2014 0851   LABSPEC 1.028 04/26/2023 0517   LABSPEC 1.020 05/16/2014 0851   PHURINE 5.0 04/26/2023 0517   GLUCOSEU >=500 (A) 04/26/2023 0517   GLUCOSEU 150 mg/dL 16/05/9603 5409   HGBUR SMALL (A) 04/26/2023 0517   BILIRUBINUR NEGATIVE 04/26/2023 0517   BILIRUBINUR Negative 05/16/2014 0851   KETONESUR 5 (A) 04/26/2023 0517   PROTEINUR 30 (A) 04/26/2023 0517   NITRITE NEGATIVE 04/26/2023 0517   LEUKOCYTESUR NEGATIVE 04/26/2023 0517   LEUKOCYTESUR 3+ 05/16/2014 0851    Radiological Exams on  Admission: DG Chest 2 View Result Date: 09/29/2023 CLINICAL DATA:  Nausea, vomiting, cough EXAM: CHEST - 2 VIEW COMPARISON:  04/25/2023 FINDINGS: Stable cardiomediastinal silhouette. Reticulonodular opacities in the lower lungs. No pleural effusion or pneumothorax. No displaced rib fractures. IMPRESSION: Reticulonodular opacities in the lower lungs compatible with bronchiolitis and/or aspiration. Electronically Signed   By: Minerva Fester M.D.   On: 09/29/2023 21:15     Data Reviewed: Relevant notes from primary care and specialist visits, past discharge summaries as available in EHR, including Care Everywhere. Prior diagnostic testing as pertinent to current admission diagnoses Updated medications and problem lists for reconciliation ED course, including vitals, labs, imaging, treatment and response to treatment Triage notes, nursing and pharmacy notes and ED provider's notes Notable results as noted in HPI   Assessment and Plan: COPD (chronic obstructive pulmonary disease) (HCC) Bronchiectasis Bronchiolitis on  chest x-ray DuoNebs Q6 and as needed Guaifenesin Flutter valve  Aspiration pneumonia (HCC) Rocephin and azithromycin Antitussives, flutter valve incentive spirometer Will keep n.p.o. SLP consult Supplemental oxygen if needed   CAD S/P percutaneous coronary angioplasty No complaints of chest pain Continue aspirin  Heart failure with reduced ejection fraction (HFrEF, <= 40%) (HCC) Echo 2021 with EF 35 to 40% Clinically euvolemic On no related meds  Diabetes mellitus without complication (HCC) Sliding scale insulin coverage     DVT prophylaxis: Lovenox  Consults: none  Advance Care Planning:   Code Status: Prior   Family Communication: none  Disposition Plan: Back to previous home environment  Severity of Illness: The appropriate patient status for this patient is INPATIENT. Inpatient status is judged to be reasonable and necessary in order to provide the  required intensity of service to ensure the patient's safety. The patient's presenting symptoms, physical exam findings, and initial radiographic and laboratory data in the context of their chronic comorbidities is felt to place them at high risk for further clinical deterioration. Furthermore, it is not anticipated that the patient will be medically stable for discharge from the hospital within 2 midnights of admission.   * I certify that at the point of admission it is my clinical judgment that the patient will require inpatient hospital care spanning beyond 2 midnights from the point of admission due to high intensity of service, high risk for further deterioration and high frequency of surveillance required.*  Author: Andris Baumann, MD 09/30/2023 2:18 AM  For on call review [email or web address].

## 2023-09-30 NOTE — Progress Notes (Addendum)
  Progress Note   Patient: George Hensley ZOX:[id number] DOB: [birth year] DOA: 09/30/2023     0 DOS: the patient was seen and examined on 09/30/2023   Brief hospital course: Taken from H&P.  BENJIMEN KELLEY is a 89 y.o. male with medical history significant for COPD on as needed oxygen, bronchiectasis, type 2 diabetes, HFrEF, EF 30 to 35%, CAD with prior stenting, hospitalized September 2024 with aspiration pneumonia being admitted with possible aspiration pneumonia after presenting with cough congestion and shortness of breath that started on the day of arrival.  He was febrile with EMS to 100.4.   Patient was afebrile on presentation, labs with leukocytosis at 17.4, respiratory viral panel negative.  Chest x-ray compatible with bronchiolitis or aspiration. Patient was started on Rocephin and azithromycin.  2/3: Vital stable, remained afebrile, stable leukocytosis at 17.6, no lactic acidosis, preliminary blood cultures negative.  Swallow evaluation with moderate risk, they are advising regular diet with aspiration precautions.  They were also recommending barium meal studies for further evaluation but he wants to discuss with his own pulmonologist at [facility] before making any decision.  Patient never met sepsis criteria so sepsis ruled out.   Assessment and Plan: * Aspiration pneumonia [facility]) Patient with concern of recurrent aspiration pneumonia, swallow evaluation with moderate risk, they are recommending barium swallow studies for further evaluation but patient would like to discuss with his own pulmonologist at [facility]. No baseline oxygen use, currently on 2 L of oxygen -Continue Rocephin and azithromycin -Antitussives, flutter valve incentive spirometer Supplemental oxygen -wean as tolerated  COPD (chronic obstructive pulmonary disease) (HCC) Bronchiectasis Bronchiolitis on chest x-ray DuoNebs Q6 and as needed Guaifenesin Flutter valve  CAD S/P percutaneous coronary angioplasty No  complaints of chest pain Continue aspirin  Heart failure with reduced ejection fraction (HFrEF, <= 40%) (HCC) Echo 2021 with EF 35 to 40% Clinically euvolemic On no related meds  Diabetes mellitus without complication (HCC) Sliding scale insulin coverage   Subjective: Patient was resting comfortably when seen today.  Not much upper respiratory symptoms.  He wants to discuss with his own pulmonologist regarding further investigation with barium swallow.  Physical Exam: Vitals:   09/30/23 0724 09/30/23 1000 09/30/23 1100 09/30/23 1117  BP:  105/60 107/61   Pulse:  72 78   Resp:  (!) 21 (!) 23   Temp: 97.8 F (36.6 C)   98.2 F (36.8 C)  TempSrc: Oral   Oral  SpO2:  100% 97%   Weight:      Height:       General.  Frail elderly man, in no acute distress. Pulmonary.  Harsh breath sounds bilaterally, normal respiratory effort. CV.  Regular rate and rhythm, no JVD, rub or murmur. Abdomen.  Soft, nontender, nondistended, BS positive. CNS.  Alert and oriented .  No focal neurologic deficit. Extremities.  No edema, no cyanosis, pulses intact and symmetrical. Psychiatry.  Judgment and insight appears normal.   Data Reviewed: Prior data reviewed  Family Communication: Called son with no response  Disposition: Status is: Inpatient Remains inpatient appropriate because: Severity of illness  Planned Discharge Destination: Home  DVT prophylaxis.  Lovenox Time spent:  minutes  This record has been created using Conservation officer, historic buildings. Errors have been sought and corrected,but may not always be located. Such creation errors do not reflect on the standard of care.   Author: Arnetha Courser, MD 09/30/2023 12:15 PM  For on call review [email or web address].

## 2023-09-30 NOTE — Hospital Course (Addendum)
Taken from H&P.  George Hensley is a 89 y.o. male with medical history significant for COPD on as needed oxygen, bronchiectasis, type 2 diabetes, HFrEF, EF 30 to 35%, CAD with prior stenting, hospitalized September 2024 with aspiration pneumonia being admitted with possible aspiration pneumonia after presenting with cough congestion and shortness of breath that started on the day of arrival.  He was febrile with EMS to 100.4.   Patient was afebrile on presentation, labs with leukocytosis at 17.4, respiratory viral panel negative.  Chest x-ray compatible with bronchiolitis or aspiration. Patient was started on Rocephin and azithromycin.  2/3: Vital stable, remained afebrile, stable leukocytosis at 17.6, no lactic acidosis, preliminary blood cultures negative.  Swallow evaluation with moderate risk, they are advising regular diet with aspiration precautions.  They were also recommending barium meal studies for further evaluation but he wants to discuss with his own pulmonologist at [facility] before making any decision.  Patient never met sepsis criteria so sepsis ruled out.  2/4: Remained hemodynamically stable.  Leukocytosis improving.  Per patient he has an appointment with his pulmonologist and he will discuss regarding getting further investigation with barium swallow studies for this concerning recurrent aspiration.  He was provided with aspiration precautions and 7 more doses of Augmentin to complete the course of antibiotic.  Patient also need a repeat chest imaging to see the resolution of current abnormalities.  He has a pulmonologist at [facility] and would like to continue following up with him for further assistance.  Patient will continue on his current medications and need to have a close follow-up with his providers for further management.

## 2023-09-30 NOTE — Assessment & Plan Note (Addendum)
Patient with concern of recurrent aspiration pneumonia, swallow evaluation with moderate risk, they are recommending barium swallow studies for further evaluation but patient would like to discuss with his own pulmonologist at [facility]. No baseline oxygen use, currently on 2 L of oxygen -Continue Rocephin and azithromycin -Antitussives, flutter valve incentive spirometer Supplemental oxygen -wean as tolerated

## 2023-09-30 NOTE — Assessment & Plan Note (Addendum)
Echo 2021 with EF 35 to 40% Clinically euvolemic On no related meds

## 2023-09-30 NOTE — ED Notes (Signed)
Assumed patient care at approximately 1500 and received report from the previous nurse.

## 2023-09-30 NOTE — Assessment & Plan Note (Signed)
No complaints of chest pain Continue aspirin

## 2023-09-30 NOTE — ED Notes (Signed)
Pt requesting to speak with MD at this time d/t wanting his home medications. MD notified.

## 2023-09-30 NOTE — Assessment & Plan Note (Addendum)
Bronchiectasis Bronchiolitis on chest x-ray DuoNebs Q6 and as needed Guaifenesin Flutter valve

## 2023-09-30 NOTE — ED Notes (Addendum)
This Rn took pt to bathroom via wheelchair. Pt was able to stand and pivot without incident. Pt had one episode of small stool and urine. Pt returned to bed, NAD, Call bell within reach

## 2023-09-30 NOTE — Evaluation (Signed)
Clinical/Bedside Swallow Evaluation Patient Details  Name: George Hensley MRN: [id number] Date of Birth: June 07, 1931  Today's Date: 09/30/2023 Time: SLP Start Time (ACUTE ONLY): 4098 SLP Stop Time (ACUTE ONLY): 0940 SLP Time Calculation (min) (ACUTE ONLY): 18 min  Past Medical History:  Past Medical History:  Diagnosis Date   Asthma    Bronchiectasis (HCC)    COPD (chronic obstructive pulmonary disease) (HCC)    Diabetes mellitus    Past Surgical History:  Past Surgical History:  Procedure Laterality Date   CHOLECYSTECTOMY  2000   CORONARY STENT PLACEMENT  1999   HPI:  89yo male admitted 09/30/23 with possible aspiration PNA, cough, congestion, SOB, fatigue since 09/29/23. PMH: asthma, COPD, O2 PRN, bronchiectasis, DM2, HFrEF, CAD, aspiration PNA (04/2023). CXR = bronchiolitis vs aspiration.    Assessment / Plan / Recommendation  Clinical Impression  Pt continues to present with functional oropharyngeal swallow at bedside (Previous BSE 03/2023), in setting of declined pulmonary status, hx COPD, and advanced age at baseline, all of which increase aspiration risk.  CN exam unremarkable. Pt with adequate dentition, and strong congested nonproductive volitional cough. Pt reports no difficulty swallowing prior to admit. Cough noted intermittently during this assessment in the absence of PO trials. Pt accepted trials of thin liquid via cup and straw, puree, and solid textures. No overt or immediate s/s aspiration observed following PO trials, including Yale 3oz water challenge.   Given medical history including 2 hospitalizations since August 2024 with suspected aspiration PNA, consideration of instrumental swallow assessment is recommended to objectively assess swallow function and aspiration risk. Purpose and rationale for MBS was explained to pt, who reported he would "think about it".   Will begin regular diet with thin liquids, meds as tolerated. SLP will follow up for pt decision re:  objective study. RN and MD informed.  SLP Visit Diagnosis: Dysphagia, unspecified (R13.10)    Aspiration Risk  Moderate aspiration risk    Diet Recommendation Regular;Thin liquid    Liquid Administration via: Cup;Straw Medication Administration: Other (Comment) (as tolerated) Supervision: Patient able to self feed Compensations: Minimize environmental distractions;Small sips/bites;Slow rate Postural Changes: Seated upright at 90 degrees;Remain upright for at least 30 minutes after po intake    Other  Recommendations Oral Care Recommendations: Oral care BID    Recommendations for follow up therapy are one component of a multi-disciplinary discharge planning process, led by the attending physician.  Recommendations may be updated based on patient status, additional functional criteria and insurance authorization.  Follow up Recommendations Follow physician's recommendations for discharge plan and follow up therapies      Assistance Recommended at Discharge  Per MD  Functional Status Assessment Patient has had a recent decline in their functional status and demonstrates the ability to make significant improvements in function in a reasonable and predictable amount of time.  Frequency and Duration min 1 x/week  1 week       Prognosis Prognosis for improved oropharyngeal function: Fair      Swallow Study   General Date of Onset: 09/30/23 HPI: 89yo male admitted 09/30/23 with possible aspiration PNA, cough, congestion, SOB, fatigue since 09/29/23. PMH: asthma, COPD, O2 PRN, bronchiectasis, DM2, HFrEF, CAD, aspiration PNA (04/2023). CXR = bronchiolitis vs aspiration. Type of Study: Bedside Swallow Evaluation Previous Swallow Assessment: BSE 04/26/23 = soft reg/thin no straws, meds whole in puree. Diet Prior to this Study: NPO Respiratory Status: Nasal cannula History of Recent Intubation: No Behavior/Cognition: Alert;Cooperative;Pleasant mood Oral Cavity Assessment: Within Functional  Limits  Oral Care Completed by SLP: No Oral Cavity - Dentition: Adequate natural dentition Vision: Functional for self-feeding Self-Feeding Abilities: Able to feed self Patient Positioning: Upright in bed Baseline Vocal Quality: Normal Volitional Cough: Strong;Congested;Other (Comment) (nonproductive) Volitional Swallow: Able to elicit    Oral/Motor/Sensory Function Overall Oral Motor/Sensory Function: Within functional limits   Ice Chips Ice chips: Not tested   Thin Liquid Thin Liquid: Within functional limits Presentation: Cup;Straw    Nectar Thick Nectar Thick Liquid: Not tested   Honey Thick Honey Thick Liquid: Not tested   Puree Puree: Within functional limits Presentation: Self Fed;Spoon   Solid     Solid: Within functional limits Presentation: Self Fed     Nanna Ertle B. Murvin Natal, MSP, CCC-SLP Speech Language Pathologist  [facility] 09/30/2023,9:50 AM

## 2023-09-30 NOTE — ED Provider Notes (Signed)
[facility] Provider Note    Event Date/Time   First MD Initiated Contact with Patient 09/30/23 0102     (approximate)   History   Fatigue   HPI  George Hensley is a 89 y.o. male with history of diabetes, CAD, COPD, asthma, bronchiectasis who presents to the emergency department with cough, congestion and shortness of breath that started today.  No known fevers at home but was febrile with EMS 200.4 and given a gram of Tylenol.  He denies any vomiting, diarrhea.  No chest pain.  States he lives at home alone.   History provided by patient.    Past Medical History:  Diagnosis Date   Asthma    Bronchiectasis (HCC)    COPD (chronic obstructive pulmonary disease) (HCC)    Diabetes mellitus     Past Surgical History:  Procedure Laterality Date   CHOLECYSTECTOMY  2000   CORONARY STENT PLACEMENT  1999    MEDICATIONS:  Prior to Admission medications   Medication Sig Start Date End Date Taking? Authorizing Provider  albuterol (VENTOLIN HFA) 108 (90 Base) MCG/ACT inhaler Inhale 1-2 puffs into the lungs every 6 (six) hours as needed for wheezing or shortness of breath.    [provider]  aspirin EC 81 MG tablet Take 81 mg by mouth daily.     [provider]  diltiazem (DILACOR XR) 120 MG 24 hr capsule Take 120 mg by mouth daily.     [provider]  empagliflozin (JARDIANCE) 25 MG TABS tablet Take 25 mg by mouth daily.    [provider]  Fluticasone-Umeclidin-Vilant 100-62.5-25 MCG/ACT AEPB Inhale 1 puff into the lungs daily.    [provider]  glipiZIDE (GLUCOTROL XL) 10 MG 24 hr tablet Take 10 mg by mouth daily.    [provider]  hydrOXYzine (ATARAX) 25 MG tablet Take 25 mg by mouth 2 (two) times daily as needed for itching.    [provider]  montelukast (SINGULAIR) 10 MG tablet Take 1 tablet by mouth daily.    [provider]  tamsulosin (FLOMAX) 0.4 MG CAPS capsule Take  0.4 mg by mouth daily.    [provider]    Physical Exam   Triage Vital Signs: ED Triage Vitals  Encounter Vitals Group     BP 09/29/23 2043 124/67     Systolic BP Percentile --      Diastolic BP Percentile --      Pulse Rate 09/29/23 2043 99     Resp 09/29/23 2043 18     Temp 09/29/23 2043 99.4 F (37.4 C)     Temp Source 09/29/23 2043 Oral     SpO2 09/29/23 2043 95 %     Weight 09/29/23 2047 160 lb (72.6 kg)     Height 09/29/23 2047 5\' 8"  (1.727 m)     Head Circumference --      Peak Flow --      Pain Score 09/29/23 2047 0     Pain Loc --      Pain Education --      Exclude from Growth Chart --     Most recent vital signs: Vitals:   09/29/23 2043  BP: 124/67  Pulse: 99  Resp: 18  Temp: 99.4 F (37.4 C)  SpO2: 95%    CONSTITUTIONAL: Alert, responds appropriately to questions.  Elderly, in no distress HEAD: Normocephalic, atraumatic EYES: Conjunctivae clear, pupils appear equal, sclera nonicteric ENT: normal nose; moist  mucous membranes NECK: Supple, normal ROM CARD: RRR; S1 and S2 appreciated RESP: Normal chest excursion without splinting or tachypnea; breath sounds clear and equal bilaterally; no wheezes, no rhonchi, no rales, no hypoxia or respiratory distress, speaking full sentences ABD/GI: Non-distended; soft, non-tender, no rebound, no guarding, no peritoneal signs BACK: The back appears normal EXT: Normal ROM in all joints; no deformity noted, no edema SKIN: Normal color for age and race; warm; no rash on exposed skin NEURO: Moves all extremities equally, normal speech PSYCH: The patient's mood and manner are appropriate.   ED Results / Procedures / Treatments   LABS: (all labs ordered are listed, but only abnormal results are displayed) Labs Reviewed  CBC - Abnormal; Notable for the following components:      Result Value   WBC 17.4 (*)    All other components within normal limits  BASIC METABOLIC PANEL - Abnormal; Notable for the  following components:   Glucose, Bld 171 (*)    BUN 24 (*)    Calcium 8.4 (*)    All other components within normal limits  RESP PANEL BY RT-PCR (RSV, FLU A&B, COVID)  RVPGX2  CULTURE, BLOOD (ROUTINE X 2)  CULTURE, BLOOD (ROUTINE X 2)  LACTIC ACID, PLASMA  LACTIC ACID, PLASMA  CBC  BASIC METABOLIC PANEL     EKG:  EKG Interpretation Date/Time:  Monday September 30 2023 02:01:06 EST Ventricular Rate:  85 PR Interval:  128 QRS Duration:  128 QT Interval:  402 QTC Calculation: 478 R Axis:   264  Text Interpretation: Normal sinus rhythm Right bundle branch block Anteroseptal infarct (cited on or before 25-Apr-2023) Abnormal ECG When compared with ECG of 25-Apr-2023 21:03, Premature ventricular complexes are no longer Present Right bundle branch block has replaced Non-specific intra-ventricular conduction block Confirmed by Rochele Raring [phone]) on 09/30/2023 2:12:44 AM         RADIOLOGY: My personal review and interpretation of imaging: Chest x-ray shows bibasilar opacities.  I have personally reviewed all radiology reports.   DG Chest 2 View Result Date: 09/29/2023 CLINICAL DATA:  Nausea, vomiting, cough EXAM: CHEST - 2 VIEW COMPARISON:  04/25/2023 FINDINGS: Stable cardiomediastinal silhouette. Reticulonodular opacities in the lower lungs. No pleural effusion or pneumothorax. No displaced rib fractures. IMPRESSION: Reticulonodular opacities in the lower lungs compatible with bronchiolitis and/or aspiration. Electronically Signed   By: Minerva Fester M.D.   On: 09/29/2023 21:15     PROCEDURES:  Critical Care performed: Yes, see critical care procedure note(s)   CRITICAL CARE Performed by: Baxter Hire Zelig Gacek   Total critical care time: 40 minutes  Critical care time was exclusive of separately billable procedures and treating other patients.  Critical care was necessary to treat or prevent imminent or life-threatening deterioration.  Critical care was time spent personally by  me on the following activities: development of treatment plan with patient and/or surrogate as well as nursing, discussions with consultants, evaluation of patient's response to treatment, examination of patient, obtaining history from patient or surrogate, ordering and performing treatments and interventions, ordering and review of laboratory studies, ordering and review of radiographic studies, pulse oximetry and re-evaluation of patient's condition.   Marland Kitchen1-3 Lead EKG Interpretation  Performed by: Ahad Colarusso, Layla Maw, DO Authorized by: Imunique Samad, Layla Maw, DO     Interpretation: normal     ECG rate:  99   ECG rate assessment: normal     Rhythm: sinus rhythm     Ectopy: none     Conduction: normal  IMPRESSION / MDM / ASSESSMENT AND PLAN / ED COURSE  I reviewed the triage vital signs and the nursing notes.    Patient here with shortness of breath, cough, fever.  The patient is on the cardiac monitor to evaluate for evidence of arrhythmia and/or significant heart rate changes.   DIFFERENTIAL DIAGNOSIS (includes but not limited to):   Pneumonia, viral URI, bronchiectasis, COPD, asthma exacerbation, ACS, CHF   Patient's presentation is most consistent with acute presentation with potential threat to life or bodily function.   PLAN: Patient febrile with EMS.  Heart rate in the upper 90s.  Labs obtained from triage show leukocytosis of 17,000.  Patient meets sepsis criteria.  Chest x-ray reviewed and interpreted by myself and the radiologist and shows bibasilar opacities.  Will start him on antibiotics for community-acquired pneumonia.  He denies any vomiting, choking on food, aspiration.  Doubt aspiration pneumonia/pneumonitis.  COVID, flu and RSV negative.  Lungs clear to auscultation currently.  No current signs of asthma or COPD exacerbation.  No hypoxia currently.   MEDICATIONS GIVEN IN ED: Medications  aspirin EC tablet 81 mg (has no administration in time range)  tamsulosin  (FLOMAX) capsule 0.4 mg (has no administration in time range)  enoxaparin (LOVENOX) injection 40 mg (has no administration in time range)  acetaminophen (TYLENOL) tablet 650 mg (has no administration in time range)    Or  acetaminophen (TYLENOL) suppository 650 mg (has no administration in time range)  ondansetron (ZOFRAN) tablet 4 mg (has no administration in time range)    Or  ondansetron (ZOFRAN) injection 4 mg (has no administration in time range)  cefTRIAXone (ROCEPHIN) 2 g in sodium chloride 0.9 % 100 mL IVPB (has no administration in time range)  azithromycin (ZITHROMAX) 500 mg in sodium chloride 0.9 % 250 mL IVPB (has no administration in time range)  predniSONE (DELTASONE) tablet 40 mg (has no administration in time range)  ipratropium-albuterol (DUONEB) 0.5-2.5 (3) MG/3ML nebulizer solution 3 mL (has no administration in time range)  albuterol (PROVENTIL) (2.5 MG/3ML) 0.083% nebulizer solution 2.5 mg (has no administration in time range)  insulin aspart (novoLOG) injection 0-15 Units (has no administration in time range)  lactated ringers bolus 1,000 mL (1,000 mLs Intravenous New Bag/Given 09/30/23 0156)  cefTRIAXone (ROCEPHIN) 2 g in sodium chloride 0.9 % 100 mL IVPB (0 g Intravenous Stopped 09/30/23 0208)  azithromycin (ZITHROMAX) 500 mg in sodium chloride 0.9 % 250 mL IVPB (500 mg Intravenous New Bag/Given 09/30/23 0156)     ED COURSE: Patient's oxygen saturation dropped to 86% on room air.  Doing well on nasal cannula.  Lactic acid 0.9.   CONSULTS:  Consulted and discussed patient's case with hospitalist, Dr. Para March.  I have recommended admission and consulting physician agrees and will place admission orders.  Patient (and family if present) agree with this plan.   I reviewed all nursing notes, vitals, pertinent previous records.  All labs, EKGs, imaging ordered have been independently reviewed and interpreted by myself.    OUTSIDE RECORDS REVIEWED: [facility]ed [facility]  notes.       FINAL CLINICAL IMPRESSION(S) / ED DIAGNOSES   Final diagnoses:  Acute sepsis (HCC)  Bronchiolitis  Acute respiratory failure with hypoxia (HCC)     Rx / DC Orders   ED Discharge Orders     None        Note:  This document was prepared using Dragon voice recognition software and may include unintentional dictation errors.   Bera Pinela, Layla Maw,  DO 09/30/23 3762

## 2023-10-01 DIAGNOSIS — I5020 Unspecified systolic (congestive) heart failure: Secondary | ICD-10-CM

## 2023-10-01 DIAGNOSIS — J449 Chronic obstructive pulmonary disease, unspecified: Secondary | ICD-10-CM

## 2023-10-01 DIAGNOSIS — J219 Acute bronchiolitis, unspecified: Secondary | ICD-10-CM

## 2023-10-01 DIAGNOSIS — E119 Type 2 diabetes mellitus without complications: Secondary | ICD-10-CM

## 2023-10-01 DIAGNOSIS — J690 Pneumonitis due to inhalation of food and vomit: Secondary | ICD-10-CM

## 2023-10-01 LAB — GLUCOSE, CAPILLARY
Glucose-Capillary: 117 mg/dL — ABNORMAL HIGH (ref 70–99)
Glucose-Capillary: 145 mg/dL — ABNORMAL HIGH (ref 70–99)
Glucose-Capillary: 231 mg/dL — ABNORMAL HIGH (ref 70–99)
Glucose-Capillary: 47 mg/dL — ABNORMAL LOW (ref 70–99)
Glucose-Capillary: 72 mg/dL (ref 70–99)

## 2023-10-01 LAB — CBC
HCT: 38.1 % — ABNORMAL LOW (ref 39.0–52.0)
Hemoglobin: 12.1 g/dL — ABNORMAL LOW (ref 13.0–17.0)
MCH: 30.3 pg (ref 26.0–34.0)
MCHC: 31.8 g/dL (ref 30.0–36.0)
MCV: 95.5 fL (ref 80.0–100.0)
Platelets: 172 10*3/uL (ref 150–400)
RBC: 3.99 MIL/uL — ABNORMAL LOW (ref 4.22–5.81)
RDW: 14.9 % (ref 11.5–15.5)
WBC: 13.3 10*3/uL — ABNORMAL HIGH (ref 4.0–10.5)
nRBC: 0 % (ref 0.0–0.2)

## 2023-10-01 MED ORDER — PHENOL 1.4 % MT LIQD: 1.0000 | OROMUCOSAL | Status: DC | PRN

## 2023-10-01 MED ORDER — INSULIN ASPART 100 UNIT/ML IJ SOLN
0.0000 [IU] | Freq: Three times a day (TID) | INTRAMUSCULAR | Status: DC
  Administered 2023-10-01: 1 [IU] via SUBCUTANEOUS

## 2023-10-01 MED ORDER — INSULIN ASPART 100 UNIT/ML IJ SOLN: 0.0000 [IU] | Freq: Three times a day (TID) | INTRAMUSCULAR | Status: DC

## 2023-10-01 MED ORDER — AMOXICILLIN-POT CLAVULANATE 875-125 MG PO TABS: 1.0000 | ORAL_TABLET | Freq: Two times a day (BID) | ORAL | 0 refills | Status: AC

## 2023-10-01 MED ORDER — SALINE SPRAY 0.65 % NA SOLN: 1.0000 | NASAL | Status: DC | PRN

## 2023-10-01 MED ORDER — BLISTEX MEDICATED EX OINT: 1.0000 | TOPICAL_OINTMENT | CUTANEOUS | Status: DC | PRN

## 2023-10-01 MED ORDER — POLYVINYL ALCOHOL 1.4 % OP SOLN: 1.0000 [drp] | OPHTHALMIC | Status: DC | PRN

## 2023-10-01 MED ORDER — LORATADINE 10 MG PO TABS: 10.0000 mg | ORAL_TABLET | Freq: Every day | ORAL | Status: DC | PRN

## 2023-10-01 MED ORDER — ALUM & MAG HYDROXIDE-SIMETH 200-200-20 MG/5ML PO SUSP: 30.0000 mL | ORAL | Status: DC | PRN

## 2023-10-01 MED ORDER — AMOXICILLIN-POT CLAVULANATE 875-125 MG PO TABS: 1.0000 | ORAL_TABLET | Freq: Two times a day (BID) | ORAL | Status: DC

## 2023-10-01 MED ORDER — INSULIN ASPART 100 UNIT/ML IJ SOLN: 0.0000 [IU] | Freq: Every day | INTRAMUSCULAR | Status: DC

## 2023-10-01 MED ORDER — GUAIFENESIN-DM 100-10 MG/5ML PO SYRP: 5.0000 mL | ORAL_SOLUTION | ORAL | Status: DC | PRN

## 2023-10-01 MED ORDER — GUAIFENESIN-DM 100-10 MG/5ML PO SYRP: 5.0000 mL | ORAL_SOLUTION | ORAL | 0 refills | Status: DC | PRN

## 2023-10-01 NOTE — Inpatient Diabetes Management (Signed)
Inpatient Diabetes Program Recommendations  AACE/ADA: New Consensus Statement on Inpatient Glycemic Control   Target Ranges:  Prepandial:   less than 140 mg/dL      Peak postprandial:   less than 180 mg/dL (1-2 hours)      Critically ill patients:  140 - 180 mg/dL    Latest Reference Range & Units 10/01/23 00:11 10/01/23 04:44 10/01/23 07:55  Glucose-Capillary 70 - 99 mg/dL 161 (H) 47 (L) 096 (H)    Latest Reference Range & Units 09/30/23 04:29 09/30/23 08:19 09/30/23 11:15 09/30/23 16:01 09/30/23 20:16  Glucose-Capillary 70 - 99 mg/dL 045 (H) 409 (H) 811 (H) 272 (H) 175 (H)   Review of Glycemic Control  Diabetes history: DM2 Outpatient Diabetes medications: Jardiance 25 mg daily (not taking per home med list), Amaryl 4 mg BID (not taking per home med list) Current orders for Inpatient glycemic control: Novolog 0-15 units Q4H; Prednisone 40 mg QAM  Inpatient Diabetes Program Recommendations:    Insulin: CBG down to 47 mg/dl at 9:14 am today (after getting Novolog 5 units for correction of CBG 231 mg/dl). Please consider decreasing Novolog correction to 0-9 units and change frequency to TID with meals if patient is eating.  Thanks, Orlando Penner, RN, MSN, CDCES Diabetes Coordinator Inpatient Diabetes Program [phone] (Team Pager from 8am to 5pm)

## 2023-10-01 NOTE — Progress Notes (Signed)
  Chaplain On-Call responded to Spiritual Care Consult Order from Amaryllis Dare, MD.  The request was for Advance Directives information for the patient.  Chaplain met the patient as he was sitting in a chair beside the bed. Chaplain provided the Advance Directives documents and education to the patient.  Chaplain offered spiritual and emotional support to the patient.  Chaplain Bebe Ardean EMERSON Hershal., [facility]

## 2023-10-01 NOTE — Discharge Summary (Signed)
 Physician Discharge Summary   Patient: George Hensley MRN: [id number] DOB: July 10, 1931  Admit date:     09/30/2023  Discharge date: 10/01/23  Discharge Physician: Amaryllis Dare   PCP: Cleotilde Oneil JULIANNA, MD   Recommendations at discharge:  Please obtain CBC and BMP on follow-up Patient need further investigation for recurrent aspiration, however swallow team recommended barium studies but he wants to discuss with his pulmonologist before proceeding.  His primary care provider or pulmonologist can order as appropriate. Follow-up with primary care provider Follow-up with pulmonology  Discharge Diagnoses: Principal Problem:   Aspiration pneumonia (HCC) Active Problems:   COPD (chronic obstructive pulmonary disease) (HCC)   CAP (community acquired pneumonia)   Diabetes mellitus without complication (HCC)   Heart failure with reduced ejection fraction (HFrEF, <= 40%) (HCC)   CAD S/P percutaneous coronary angioplasty   Bronchiolitis   Hospital Course: Taken from H&P.  George Hensley is a 89 y.o. male with medical history significant for COPD on as needed oxygen , bronchiectasis, type 2 diabetes, HFrEF, EF 30 to 35%, CAD with prior stenting, hospitalized September 2024 with aspiration pneumonia being admitted with possible aspiration pneumonia after presenting with cough congestion and shortness of breath that started on the day of arrival.  He was febrile with EMS to 100.4.   Patient was afebrile on presentation, labs with leukocytosis at 17.4, respiratory viral panel negative.  Chest x-ray compatible with bronchiolitis or aspiration. Patient was started on Rocephin  and azithromycin .  2/3: Vital stable, remained afebrile, stable leukocytosis at 17.6, no lactic acidosis, preliminary blood cultures negative.  Swallow evaluation with moderate risk, they are advising regular diet with aspiration precautions.  They were also recommending barium meal studies for further evaluation but he wants to  discuss with his own pulmonologist at [facility] before making any decision.  Patient never met sepsis criteria so sepsis ruled out.  2/4: Remained hemodynamically stable.  Leukocytosis improving.  Per patient he has an appointment with his pulmonologist and he will discuss regarding getting further investigation with barium swallow studies for this concerning recurrent aspiration.  He was provided with aspiration precautions and 7 more doses of Augmentin  to complete the course of antibiotic.  Patient also need a repeat chest imaging to see the resolution of current abnormalities.  He has a pulmonologist at [facility] and would like to continue following up with him for further assistance.  Patient will continue on his current medications and need to have a close follow-up with his providers for further management.  Assessment and Plan: * Aspiration pneumonia [facility]) Patient with concern of recurrent aspiration pneumonia, swallow evaluation with moderate risk, they are recommending barium swallow studies for further evaluation but patient would like to discuss with his own pulmonologist at [facility]. No baseline oxygen  use, now on room air -Continue Rocephin  and azithromycin -being discharged on Augmentin  to complete the course -Antitussives, flutter valve incentive spirometer Supplemental oxygen  -wean as tolerated  COPD (chronic obstructive pulmonary disease) (HCC) Bronchiectasis Bronchiolitis on chest x-ray DuoNebs Q6 and as needed Guaifenesin  Flutter valve  CAD S/P percutaneous coronary angioplasty No complaints of chest pain Continue aspirin   Heart failure with reduced ejection fraction (HFrEF, <= 40%) (HCC) Echo 2021 with EF 35 to 40% Clinically euvolemic On no related meds  Diabetes mellitus without complication (HCC) Sliding scale insulin  coverage   Consultants: None Procedures performed: None Disposition: Home health Diet recommendation:  Discharge Diet Orders (From admission, onward)      Start     Ordered   10/01/23  0000  Diet - low sodium heart healthy        10/01/23 1156           Cardiac diet DISCHARGE MEDICATION: Allergies as of 10/01/2023       Reactions   Metoprolol    Pt states syncope with lopressor        Medication List     STOP taking these medications    diltiazem  120 MG 24 hr capsule Commonly known as: DILACOR XR    glimepiride  4 MG tablet Commonly known as: AMARYL    hydrOXYzine  25 MG tablet Commonly known as: ATARAX    Jardiance  25 MG Tabs tablet Generic drug: empagliflozin    umeclidinium-vilanterol 62.5-25 MCG/ACT Aepb Commonly known as: ANORO ELLIPTA       TAKE these medications    albuterol  108 (90 Base) MCG/ACT inhaler Commonly known as: VENTOLIN  HFA Inhale 1-2 puffs into the lungs every 6 (six) hours as needed for wheezing or shortness of breath.   amoxicillin -clavulanate 875-125 MG tablet Commonly known as: AUGMENTIN  Take 1 tablet by mouth every 12 (twelve) hours for 7 doses.   aspirin  EC 81 MG tablet Take 81 mg by mouth daily.   camphor-menthol lotion Commonly known as: SARNA Apply 1 Application topically every 6 (six) hours as needed for itching.   ciclopirox 8 % solution Commonly known as: PENLAC Apply 1 Application topically daily as needed (nail fungus).   diltiazem  120 MG 24 hr capsule Commonly known as: CARDIZEM  CD Take 120 mg by mouth daily.   finasteride 5 MG tablet Commonly known as: PROSCAR Take 5 mg by mouth daily.   fluticasone  50 MCG/ACT nasal spray Commonly known as: FLONASE Place 1 spray into both nostrils daily.   Fluticasone -Umeclidin-Vilant 100-62.5-25 MCG/ACT Aepb Inhale 1 puff into the lungs daily.   glipiZIDE 10 MG 24 hr tablet Commonly known as: GLUCOTROL XL Take 10 mg by mouth daily.   guaiFENesin -dextromethorphan 100-10 MG/5ML syrup Commonly known as: ROBITUSSIN DM Take 5 mLs by mouth every 4 (four) hours as needed for cough (chest congestion).   montelukast 10 MG  tablet Commonly known as: SINGULAIR Take 1 tablet by mouth daily.   nystatin cream Commonly known as: MYCOSTATIN Apply 1 Application topically 2 (two) times daily.   tamsulosin  0.4 MG Caps capsule Commonly known as: FLOMAX  Take 0.4 mg by mouth daily.        Follow-up Information     Cleotilde Oneil FALCON, MD. Schedule an appointment as soon as possible for a visit in 1 week(s).   Specialty: Internal Medicine Contact information: [address] [place] [address] [phone]                Discharge Exam: George Hensley   09/29/23 2047 09/30/23 2209  Weight: 72.6 kg 70.7 kg   General.  Frail elderly man, in no acute distress. Pulmonary.  Lungs clear bilaterally, normal respiratory effort. CV.  Regular rate and rhythm, no JVD, rub or murmur. Abdomen.  Soft, nontender, nondistended, BS positive. CNS.  Alert and oriented .  No focal neurologic deficit. Extremities.  No edema, no cyanosis, pulses intact and symmetrical. Psychiatry.  Judgment and insight appears normal.   Condition at discharge: stable  The results of significant diagnostics from this hospitalization (including imaging, microbiology, ancillary and laboratory) are listed below for reference.   Imaging Studies: DG Chest 2 View Result Date: 09/29/2023 CLINICAL DATA:  Nausea, vomiting, cough EXAM: CHEST - 2 VIEW COMPARISON:  04/25/2023 FINDINGS: Stable cardiomediastinal silhouette. Reticulonodular opacities in  the lower lungs. No pleural effusion or pneumothorax. No displaced rib fractures. IMPRESSION: Reticulonodular opacities in the lower lungs compatible with bronchiolitis and/or aspiration. Electronically Signed   By: Norman Gatlin M.D.   On: 09/29/2023 21:15    Microbiology: Results for orders placed or performed during the hospital encounter of 09/30/23  Resp panel by RT-PCR (RSV, Flu A&B, Covid) Anterior Nasal Swab     Status: None   Collection Time: 09/29/23  8:48 PM   Specimen: Anterior  Nasal Swab  Result Value Ref Range Status   SARS Coronavirus 2 by RT PCR NEGATIVE NEGATIVE Final    Comment: (NOTE) SARS-CoV-2 target nucleic acids are NOT DETECTED.  The SARS-CoV-2 RNA is generally detectable in upper respiratory specimens during the acute phase of infection. The lowest concentration of SARS-CoV-2 viral copies this assay can detect is 138 copies/mL. A negative result does not preclude SARS-Cov-2 infection and should not be used as the sole basis for treatment or other patient management decisions. A negative result may occur with  improper specimen collection/handling, submission of specimen other than nasopharyngeal swab, presence of viral mutation(s) within the areas targeted by this assay, and inadequate number of viral copies(<138 copies/mL). A negative result must be combined with clinical observations, patient history, and epidemiological information. The expected result is Negative.  Fact Sheet for Patients:  bloggercourse.com  Fact Sheet for Healthcare Providers:  seriousbroker.it  This test is no t yet approved or cleared by the [place]  FDA and  has been authorized for detection and/or diagnosis of SARS-CoV-2 by FDA under an Emergency Use Authorization (EUA). This EUA will remain  in effect (meaning this test can be used) for the duration of the COVID-19 declaration under Section 564(b)(1) of the Act, 21 U.S.C.section 360bbb-3(b)(1), unless the authorization is terminated  or revoked sooner.       Influenza A by PCR NEGATIVE NEGATIVE Final   Influenza B by PCR NEGATIVE NEGATIVE Final    Comment: (NOTE) The Xpert Xpress SARS-CoV-2/FLU/RSV plus assay is intended as an aid in the diagnosis of influenza from Nasopharyngeal swab specimens and should not be used as a sole basis for treatment. Nasal washings and aspirates are unacceptable for Xpert Xpress SARS-CoV-2/FLU/RSV testing.  Fact Sheet for  Patients: bloggercourse.com  Fact Sheet for Healthcare Providers: seriousbroker.it  This test is not yet approved or cleared by the [place]  FDA and has been authorized for detection and/or diagnosis of SARS-CoV-2 by FDA under an Emergency Use Authorization (EUA). This EUA will remain in effect (meaning this test can be used) for the duration of the COVID-19 declaration under Section 564(b)(1) of the Act, 21 U.S.C. section 360bbb-3(b)(1), unless the authorization is terminated or revoked.     Resp Syncytial Virus by PCR NEGATIVE NEGATIVE Final    Comment: (NOTE) Fact Sheet for Patients: bloggercourse.com  Fact Sheet for Healthcare Providers: seriousbroker.it  This test is not yet approved or cleared by the [place]  FDA and has been authorized for detection and/or diagnosis of SARS-CoV-2 by FDA under an Emergency Use Authorization (EUA). This EUA will remain in effect (meaning this test can be used) for the duration of the COVID-19 declaration under Section 564(b)(1) of the Act, 21 U.S.C. section 360bbb-3(b)(1), unless the authorization is terminated or revoked.  Performed at [facility], [address] [place], [place] [address]   Blood culture (routine x 2)     Status: None (Preliminary result)   Collection Time: 09/30/23  1:45 AM   Specimen: BLOOD  Result Value Ref Range Status   Specimen Description BLOOD BLOOD LEFT ARM  Final   Special Requests   Final    BOTTLES DRAWN AEROBIC AND ANAEROBIC Blood Culture adequate volume   Culture   Final    NO GROWTH 1 DAY Performed at [facility], [address], [place], [place] [address]    Report Status PENDING  Incomplete  Blood culture (routine x 2)     Status: None (Preliminary result)   Collection Time: 09/30/23  1:45 AM   Specimen: BLOOD  Result Value Ref Range Status   Specimen Description  BLOOD BLOOD RIGHT HAND  Final   Special Requests   Final    BOTTLES DRAWN AEROBIC AND ANAEROBIC Blood Culture results may not be optimal due to an inadequate volume of blood received in culture bottles   Culture   Final    NO GROWTH 1 DAY Performed at [facility], [address] [place], [place] [address]    Report Status PENDING  Incomplete    Labs: CBC: Recent Labs  Lab 09/29/23 2049 09/30/23 0430 10/01/23 0952  WBC 17.4* 17.6* 13.3*  HGB 13.4 11.6* 12.1*  HCT 41.1 37.2* 38.1*  MCV 93.4 98.7 95.5  PLT 197 146* 172   Basic Metabolic Panel: Recent Labs  Lab 09/29/23 2049 09/30/23 0430  NA 137 137  K 4.4 4.2  CL 103 109  CO2 23 22  GLUCOSE 171* 145*  BUN 24* 24*  CREATININE 1.03 0.97  CALCIUM  8.4* 7.1*   Liver Function Tests: No results for input(s): AST, ALT, ALKPHOS, BILITOT, PROT, ALBUMIN in the last 168 hours. CBG: Recent Labs  Lab 09/30/23 2016 10/01/23 0011 10/01/23 0444 10/01/23 0755 10/01/23 1145  GLUCAP 175* 231* 47* 145* 117*    Discharge time spent: greater than 30 minutes.  This record has been created using Conservation officer, historic buildings. Errors have been sought and corrected,but may not always be located. Such creation errors do not reflect on the standard of care.   Signed: Amaryllis Dare, MD [facility] 10/01/2023

## 2023-10-01 NOTE — TOC Progression Note (Addendum)
 Transition of Care [facility]) - Progression Note    Patient Details  Name: George Hensley MRN: [id number] Date of Birth: Feb 24, 1931  [facility] CM/SW Contact  Royanne JINNY Bernheim, RN Phone Number: 10/01/2023, 11:51 AM  Clinical Narrative:     The patient was set up with [facility] last admission, Darleene with [facility] asked to continue service The patient has DME at home where he lives alone, but will need a RW, he has not gotten one with insurance he stated,   The patient has Oxygen  at home for PRN use supplied by [employer] He will need safe transport to go home    Expected Discharge Plan: Home w Home Health Services Barriers to Discharge: Barriers Resolved  Expected Discharge Plan and Services   Discharge Planning Services: CM Consult   Living arrangements for the past 2 months: Single Family Home                 DME Arranged: N/A DME Agency: NA       HH Arranged: PT HH Agency: [facility] Date [facility] Agency Contacted: 09/24/23 Time HH Agency Contacted: 1149 Representative spoke with at [facility] Agency: Darleene   Social Determinants of Health (SDOH) Interventions SDOH Screenings   Food Insecurity: No Food Insecurity (09/30/2023)  Housing: Low Risk  (09/30/2023)  Transportation Needs: No Transportation Needs (09/30/2023)  Utilities: Not At Risk (09/30/2023)  Financial Resource Strain: Low Risk  (05/03/2023)   Received from [facility]  Social Connections: Socially Integrated (09/30/2023)  Tobacco Use: Medium Risk (09/29/2023)    Readmission Risk Interventions     No data to display

## 2023-10-01 NOTE — Plan of Care (Signed)
  Problem: Coping: Goal: Ability to adjust to condition or change in health will improve Outcome: Progressing   Problem: Fluid Volume: Goal: Ability to maintain a balanced intake and output will improve Outcome: Progressing   Problem: Nutritional: Goal: Maintenance of adequate nutrition will improve Outcome: Progressing

## 2023-10-01 NOTE — TOC Progression Note (Signed)
 Transition of Care [facility]) - Progression Note    Patient Details  Name: George Hensley MRN: [id number] Date of Birth: 12/24/1930  Transition of Care [facility]) CM/SW Contact  Royanne JINNY Bernheim, RN Phone Number: 10/01/2023, 1:21 PM  Clinical Narrative:     Caqlled safe transport to arrange transport  they will call the nburses desk upon arrival  Expected Discharge Plan: Home w Home Health Services Barriers to Discharge: Barriers Resolved  Expected Discharge Plan and Services   Discharge Planning Services: CM Consult   Living arrangements for the past 2 months: Single Family Home Expected Discharge Date: 10/01/23               DME Arranged: N/A DME Agency: NA       HH Arranged: PT HH Agency: [facility] Date [facility] Agency Contacted: 09/24/23 Time HH Agency Contacted: 1149 Representative spoke with at [facility] Agency: Darleene   Social Determinants of Health (SDOH) Interventions SDOH Screenings   Food Insecurity: No Food Insecurity (09/30/2023)  Housing: Low Risk  (09/30/2023)  Transportation Needs: No Transportation Needs (09/30/2023)  Utilities: Not At Risk (09/30/2023)  Financial Resource Strain: Low Risk  (05/03/2023)   Received from [facility]  Social Connections: Socially Integrated (09/30/2023)  Tobacco Use: Medium Risk (09/29/2023)    Readmission Risk Interventions     No data to display

## 2023-10-01 NOTE — Progress Notes (Addendum)
 Speech Language Pathology Treatment: Dysphagia  Patient Details Name: George Hensley MRN: [id number] DOB: [birth year] Today's Date: 10/01/2023 Time: 1040-1100 SLP Time Calculation (min) (ACUTE ONLY): 20 min  Assessment / Plan / Recommendation Clinical Impression  Pt seen at bedside for skilled ST intervention targeting assessment of diet tolerance and continued education. Pt continues to exhibit no overt s/s aspiration with PO intake, so if aspiration is occurring, it is silent. Silent aspiration cannot be determined at bedside. Pt is at increased risk for silent aspiration given diagnosis of COPD. MBS is recommended to rule out silent aspiration, given admissions in August 2024 and February 2025 with diagnosis of aspiration pneumonia. Chart review reveals pt is afebrile, breath sounds are harsh bilaterally, WBC elevated at 13.3. Cough is congested and nonproductive.  Pt reports he would like to discuss MBS with his [facility] pulmonologist. SLP explained MBS procedure and rationale for recommendation. This assessment could be done while pt is an inpatient, or as an outpatient. Pt requested SLP write down recommendations for his use when speaking to his pulmonologist, as well as contact information for primary SLP. This information was left with pt.  Respecting pt wishes to contact his personal physician, SLP will sign off at this time. Please reconsult for MBS if pt decides to proceed with this instrumental study. RN/hospital MD informed.    HPI HPI: 89yo male admitted 09/30/23 with possible aspiration PNA, cough, congestion, SOB, fatigue since 09/29/23. PMH: asthma, COPD, O2 PRN, bronchiectasis, DM2, HFrEF, CAD, aspiration PNA (04/2023). CXR = bronchiolitis vs aspiration.      SLP Plan  Discharge SLP treatment due to goals met . Request reconsult if pt decides to proceed with MBS      Recommendations for follow up therapy are one component of a multi-disciplinary discharge planning process, led by the  attending physician.  Recommendations may be updated based on patient status, additional functional criteria and insurance authorization.    Recommendations  Diet recommendations: Regular;Thin liquid Liquids provided via: Cup;Straw Medication Administration: Other (Comment) (as tolerated) Supervision: Patient able to self feed Compensations: Minimize environmental distractions;Small sips/bites;Slow rate Postural Changes and/or Swallow Maneuvers: Seated upright 90 degrees;Upright 30-60 min after meal                  Oral care BID   Intermittent Supervision/Assistance Dysphagia, unspecified (R13.10)     Discharge SLP treatment due to goals met. Request reconsult if pt decides to proceed with MBS    George Hensley B. George, MSP, CCC-SLP Speech Language Pathologist  George Hensley 10/01/2023, 11:08 AM

## 2023-10-01 NOTE — Progress Notes (Signed)
Pt arrived to unit in a bed from the ED. Pt oriented to room and unit. Call bell placed with patient and oriented on how to call the nurse.Notified patient's son, Rosanne Ashing, of patient admission.

## 2023-10-01 NOTE — Evaluation (Signed)
 Physical Therapy Evaluation Patient Details Name: TRIP CAVANAGH MRN: [id number] DOB: 04-Sep-1930 Today's Date: 10/01/2023  History of Present Illness  Pt admitted for aspiration pnuemonia with complaints of N/V and weakness. Pt with history of COPD, DM, heart failure, and CAD.  Clinical Impression  Pt is a pleasant 89 year old male who was admitted for aspiration pneumonia. Pt performs bed mobility with indep, transfers with mod I, and ambulation with supervision and RW. Pt demonstrates deficits with endurance. Recommend referral to HHPT for endurance training and monitoring breathing. Will sign off at this time. SaO2 on room air at rest = 95% SaO2 on room air while ambulating = 88% SaO2 on n/a liters of O2 while ambulating = n/a%          If plan is discharge home, recommend the following: A little help with walking and/or transfers;Help with stairs or ramp for entrance   Can travel by private vehicle        Equipment Recommendations None recommended by PT  Recommendations for Other Services       Functional Status Assessment Patient has had a recent decline in their functional status and demonstrates the ability to make significant improvements in function in a reasonable and predictable amount of time.     Precautions / Restrictions Precautions Precautions: Fall Restrictions Weight Bearing Restrictions Per Provider Order: No      Mobility  Bed Mobility Overal bed mobility: Independent             General bed mobility comments: safe technique    Transfers Overall transfer level: Modified independent Equipment used: Rolling walker (2 wheels)               General transfer comment: safe technique    Ambulation/Gait Ambulation/Gait assistance: Supervision Gait Distance (Feet): 80 Feet Assistive device: Rolling walker (2 wheels) Gait Pattern/deviations: Step-through pattern       General Gait Details: ambulated 2 laps in room with RW. No SOB  symptoms noted, however O2 sats dip to 88% with exertino  Stairs            Wheelchair Mobility     Tilt Bed    Modified Rankin (Stroke Patients Only)       Balance Overall balance assessment: Mild deficits observed, not formally tested                                           Pertinent Vitals/Pain Pain Assessment Pain Assessment: No/denies pain    Home Living Family/patient expects to be discharged to:: Private residence Living Arrangements: Alone Available Help at Discharge: Available PRN/intermittently;Neighbor Type of Home: House Home Access: Level entry       Home Layout: One level Home Equipment: [employer]ant (2 wheels);Grab bars - tub/shower;Grab bars - toilet;Cane - single point      Prior Function Prior Level of Function : Independent/Modified Independent             Mobility Comments: indep prior and reports no falls. Recently began using RW again for safety ADLs Comments: indep     Extremity/Trunk Assessment   Upper Extremity Assessment Upper Extremity Assessment: Overall WFL for tasks assessed    Lower Extremity Assessment Lower Extremity Assessment: Overall WFL for tasks assessed       Communication   Communication Communication: No apparent difficulties  Cognition Arousal: Alert Behavior During Therapy: [facility] for  tasks assessed/performed Overall Cognitive Status: Within Functional Limits for tasks assessed                                 General Comments: pleasant and agreeable to session        General Comments      Exercises     Assessment/Plan    PT Assessment All further PT needs can be met in the next venue of care  PT Problem List Decreased activity tolerance;Decreased balance;Decreased mobility;Cardiopulmonary status limiting activity       PT Treatment Interventions      PT Goals (Current goals can be found in the Care Plan section)  Acute Rehab PT Goals Patient Stated  Goal: to go home PT Goal Formulation: With patient Time For Goal Achievement: 10/01/23 Potential to Achieve Goals: Good    Frequency       Co-evaluation               AM-PAC PT 6 Clicks Mobility  Outcome Measure Help needed turning from your back to your side while in a flat bed without using bedrails?: None Help needed moving from lying on your back to sitting on the side of a flat bed without using bedrails?: None Help needed moving to and from a bed to a chair (including a wheelchair)?: None Help needed standing up from a chair using your arms (e.g., wheelchair or bedside chair)?: A Little Help needed to walk in hospital room?: A Little Help needed climbing 3-5 steps with a railing? : A Little 6 Click Score: 21    End of Session   Activity Tolerance: Patient tolerated treatment well Patient left: in bed;with bed alarm set Nurse Communication: Mobility status PT Visit Diagnosis: Difficulty in walking, not elsewhere classified (R26.2);Unsteadiness on feet (R26.81)    Time: 8848-8791 PT Time Calculation (min) (ACUTE ONLY): 17 min   Charges:   PT Evaluation $PT Eval Low Complexity: 1 Low PT Treatments $Gait Training: 8-22 mins PT General Charges $$ ACUTE PT VISIT: 1 Visit         Corean Dade, PT, DPT, GCS [phone]   Orva Riles 10/01/2023, 1:55 PM

## 2023-10-01 NOTE — Plan of Care (Signed)
  Problem: Education: Goal: Ability to describe self-care measures that may prevent or decrease complications (Diabetes Survival Skills Education) will improve Outcome: Adequate for Discharge Goal: Individualized Educational Video(s) Outcome: Adequate for Discharge   Problem: Coping: Goal: Ability to adjust to condition or change in health will improve Outcome: Adequate for Discharge   Problem: Fluid Volume: Goal: Ability to maintain a balanced intake and output will improve Outcome: Adequate for Discharge   Problem: Health Behavior/Discharge Planning: Goal: Ability to identify and utilize available resources and services will improve Outcome: Adequate for Discharge Goal: Ability to manage health-related needs will improve Outcome: Adequate for Discharge   Problem: Metabolic: Goal: Ability to maintain appropriate glucose levels will improve Outcome: Adequate for Discharge   Problem: Nutritional: Goal: Maintenance of adequate nutrition will improve Outcome: Adequate for Discharge Goal: Progress toward achieving an optimal weight will improve Outcome: Adequate for Discharge   Problem: Skin Integrity: Goal: Risk for impaired skin integrity will decrease Outcome: Adequate for Discharge   Problem: Tissue Perfusion: Goal: Adequacy of tissue perfusion will improve Outcome: Adequate for Discharge   Problem: Education: Goal: Knowledge of General Education information will improve Description: Including pain rating scale, medication(s)/side effects and non-pharmacologic comfort measures Outcome: Adequate for Discharge   Problem: Health Behavior/Discharge Planning: Goal: Ability to manage health-related needs will improve Outcome: Adequate for Discharge   Problem: Clinical Measurements: Goal: Ability to maintain clinical measurements within normal limits will improve Outcome: Adequate for Discharge Goal: Will remain free from infection Outcome: Adequate for Discharge Goal:  Diagnostic test results will improve Outcome: Adequate for Discharge Goal: Respiratory complications will improve Outcome: Adequate for Discharge Goal: Cardiovascular complication will be avoided Outcome: Adequate for Discharge   Problem: Activity: Goal: Risk for activity intolerance will decrease Outcome: Adequate for Discharge   Problem: Nutrition: Goal: Adequate nutrition will be maintained Outcome: Adequate for Discharge   Problem: Coping: Goal: Level of anxiety will decrease Outcome: Adequate for Discharge   Problem: Elimination: Goal: Will not experience complications related to bowel motility Outcome: Adequate for Discharge Goal: Will not experience complications related to urinary retention Outcome: Adequate for Discharge   Problem: Pain Managment: Goal: General experience of comfort will improve and/or be controlled Outcome: Adequate for Discharge   Problem: Safety: Goal: Ability to remain free from injury will improve Outcome: Adequate for Discharge   Problem: Skin Integrity: Goal: Risk for impaired skin integrity will decrease Outcome: Adequate for Discharge   Problem: Education: Goal: Knowledge of disease or condition will improve Outcome: Adequate for Discharge Goal: Knowledge of the prescribed therapeutic regimen will improve Outcome: Adequate for Discharge Goal: Individualized Educational Video(s) Outcome: Adequate for Discharge   Problem: Activity: Goal: Ability to tolerate increased activity will improve Outcome: Adequate for Discharge Goal: Will verbalize the importance of balancing activity with adequate rest periods Outcome: Adequate for Discharge   Problem: Respiratory: Goal: Ability to maintain a clear airway will improve Outcome: Adequate for Discharge Goal: Levels of oxygenation will improve Outcome: Adequate for Discharge Goal: Ability to maintain adequate ventilation will improve Outcome: Adequate for Discharge   Problem:  Activity: Goal: Ability to tolerate increased activity will improve Outcome: Adequate for Discharge   Problem: Clinical Measurements: Goal: Ability to maintain a body temperature in the normal range will improve Outcome: Adequate for Discharge   Problem: Respiratory: Goal: Ability to maintain adequate ventilation will improve Outcome: Adequate for Discharge Goal: Ability to maintain a clear airway will improve Outcome: Adequate for Discharge

## 2023-10-05 LAB — CULTURE, BLOOD (ROUTINE X 2)
Culture: NO GROWTH
Culture: NO GROWTH
Special Requests: ADEQUATE

## 2024-03-16 ENCOUNTER — Encounter: Payer: Self-pay | Admitting: Emergency Medicine

## 2024-03-16 ENCOUNTER — Other Ambulatory Visit: Payer: Self-pay

## 2024-03-16 DIAGNOSIS — E119 Type 2 diabetes mellitus without complications: Secondary | ICD-10-CM | POA: Diagnosis not present

## 2024-03-16 DIAGNOSIS — I5030 Unspecified diastolic (congestive) heart failure: Secondary | ICD-10-CM | POA: Insufficient documentation

## 2024-03-16 DIAGNOSIS — N179 Acute kidney failure, unspecified: Secondary | ICD-10-CM | POA: Diagnosis not present

## 2024-03-16 DIAGNOSIS — R197 Diarrhea, unspecified: Secondary | ICD-10-CM | POA: Insufficient documentation

## 2024-03-16 DIAGNOSIS — J4489 Other specified chronic obstructive pulmonary disease: Secondary | ICD-10-CM | POA: Diagnosis not present

## 2024-03-16 DIAGNOSIS — R110 Nausea: Secondary | ICD-10-CM | POA: Diagnosis present

## 2024-03-16 DIAGNOSIS — Z7982 Long term (current) use of aspirin: Secondary | ICD-10-CM | POA: Insufficient documentation

## 2024-03-16 DIAGNOSIS — R1110 Vomiting, unspecified: Secondary | ICD-10-CM | POA: Insufficient documentation

## 2024-03-16 LAB — COMPREHENSIVE METABOLIC PANEL WITH GFR
ALT: 16 U/L (ref 0–44)
AST: 24 U/L (ref 15–41)
Albumin: 3.5 g/dL (ref 3.5–5.0)
Alkaline Phosphatase: 72 U/L (ref 38–126)
Anion gap: 14 (ref 5–15)
BUN: 36 mg/dL — ABNORMAL HIGH (ref 8–23)
CO2: 24 mmol/L (ref 22–32)
Calcium: 8.7 mg/dL — ABNORMAL LOW (ref 8.9–10.3)
Chloride: 99 mmol/L (ref 98–111)
Creatinine, Ser: 1.57 mg/dL — ABNORMAL HIGH (ref 0.61–1.24)
GFR, Estimated: 42 mL/min — ABNORMAL LOW (ref >60)
Glucose, Bld: 264 mg/dL — ABNORMAL HIGH (ref 70–99)
Potassium: 3.7 mmol/L (ref 3.5–5.1)
Sodium: 137 mmol/L (ref 135–145)
Total Bilirubin: 1.2 mg/dL (ref 0.0–1.2)
Total Protein: 7.3 g/dL (ref 6.5–8.1)

## 2024-03-16 LAB — CBC
HCT: 46.2 % (ref 39.0–52.0)
Hemoglobin: 15.1 g/dL (ref 13.0–17.0)
MCH: 30.8 pg (ref 26.0–34.0)
MCHC: 32.7 g/dL (ref 30.0–36.0)
MCV: 94.1 fL (ref 80.0–100.0)
Platelets: 188 K/uL (ref 150–400)
RBC: 4.91 MIL/uL (ref 4.22–5.81)
RDW: 14 % (ref 11.5–15.5)
WBC: 8.8 K/uL (ref 4.0–10.5)
nRBC: 0 % (ref 0.0–0.2)

## 2024-03-16 LAB — LIPASE, BLOOD: Lipase: 24 U/L (ref 11–51)

## 2024-03-16 MED ORDER — ONDANSETRON HCL 4 MG/2ML IJ SOLN
4.0000 mg | Freq: Once | INTRAMUSCULAR | Status: AC
  Administered 2024-03-17: 4 mg via INTRAVENOUS
  Filled 2024-03-16: qty 2

## 2024-03-16 NOTE — ED Triage Notes (Signed)
 Pt presents to the ED via POV with complaints of emesis that started today with associated abdominal pain. Pt states he has had several episodes of emesis today. A&Ox4 at this time. Denies hematemesis, urinary sx, CP or SOB.

## 2024-03-17 ENCOUNTER — Emergency Department

## 2024-03-17 ENCOUNTER — Emergency Department
Admission: EM | Admit: 2024-03-17 | Discharge: 2024-03-17 | Disposition: A | Discharge status: Home / Self Care | Attending: Emergency Medicine | Admitting: Emergency Medicine

## 2024-03-17 DIAGNOSIS — R112 Nausea with vomiting, unspecified: Secondary | ICD-10-CM

## 2024-03-17 DIAGNOSIS — N179 Acute kidney failure, unspecified: Secondary | ICD-10-CM

## 2024-03-17 LAB — URINALYSIS, ROUTINE W REFLEX MICROSCOPIC
Bacteria, UA: NONE SEEN
Bilirubin Urine: NEGATIVE
Glucose, UA: >=500 mg/dL — AB
Hgb urine dipstick: NEGATIVE
Ketones, ur: NEGATIVE mg/dL
Leukocytes,Ua: NEGATIVE
Nitrite: NEGATIVE
Protein, ur: NEGATIVE mg/dL
Specific Gravity, Urine: 1.031 — ABNORMAL HIGH (ref 1.005–1.030)
pH: 5 (ref 5.0–8.0)

## 2024-03-17 MED ORDER — ONDANSETRON 4 MG PO TBDP: 4.0000 mg | ORAL_TABLET | Freq: Four times a day (QID) | ORAL | 0 refills | Status: DC | PRN

## 2024-03-17 MED ORDER — IOHEXOL 300 MG/ML  SOLN
80.0000 mL | Freq: Once | INTRAMUSCULAR | Status: AC | PRN
  Administered 2024-03-17: 80 mL via INTRAVENOUS

## 2024-03-17 MED ORDER — SODIUM CHLORIDE 0.9 % IV BOLUS (SEPSIS)
1000.0000 mL | Freq: Once | INTRAVENOUS | Status: AC
  Administered 2024-03-17: 1000 mL via INTRAVENOUS

## 2024-03-17 MED ORDER — ONDANSETRON 4 MG PO TBDP: 4.0000 mg | ORAL_TABLET | Freq: Three times a day (TID) | ORAL | Status: DC | PRN

## 2024-03-17 NOTE — ED Provider Notes (Signed)
 [facility] Provider Note    Event Date/Time   First MD Initiated Contact with Patient 03/17/24 [phone]     (approximate)   History   Emesis   HPI  George Hensley is a 89 y.o. male with history of diastolic heart failure, asthma, chronic bronchitis/COPD, diabetes who presents to the emergency department with nausea, vomiting and diarrhea that started today.  No abdominal pain.  No chest pain.  No increased shortness of breath.  Has chronic cough which she states is unchanged.  Has had previous cholecystectomy.  No sick contacts, recent travel.  Lives at home alone.  Reports his wife lives at [facility].   History provided by patient.    Past Medical History:  Diagnosis Date   Asthma    Bronchiectasis (HCC)    COPD (chronic obstructive pulmonary disease) (HCC)    Diabetes mellitus     Past Surgical History:  Procedure Laterality Date   CHOLECYSTECTOMY  2000   CORONARY STENT PLACEMENT  1999    MEDICATIONS:  Prior to Admission medications   Medication Sig Start Date End Date Taking? Authorizing Provider  albuterol  (VENTOLIN  HFA) 108 (90 Base) MCG/ACT inhaler Inhale 1-2 puffs into the lungs every 6 (six) hours as needed for wheezing or shortness of breath. Patient not taking: Reported on 09/30/2023    [provider]  aspirin  EC 81 MG tablet Take 81 mg by mouth daily.     [provider]  camphor-menthol VIKKI) lotion Apply 1 Application topically every 6 (six) hours as needed for itching. 09/25/23   [provider]  ciclopirox (PENLAC) 8 % solution Apply 1 Application topically daily as needed (nail fungus). 10/08/22   [provider]  diltiazem  (CARDIZEM  CD) 120 MG 24 hr capsule Take 120 mg by mouth daily. 07/16/23   [provider]  finasteride (PROSCAR) 5 MG tablet Take 5 mg by mouth daily.    [provider]  fluticasone  (FLONASE) 50 MCG/ACT nasal spray Place 1 spray into both nostrils daily.  08/13/23   [provider]  Fluticasone -Umeclidin-Vilant 100-62.5-25 MCG/ACT AEPB Inhale 1 puff into the lungs daily.    [provider]  glipiZIDE (GLUCOTROL XL) 10 MG 24 hr tablet Take 10 mg by mouth daily.    [provider]  guaiFENesin -dextromethorphan (ROBITUSSIN DM) 100-10 MG/5ML syrup Take 5 mLs by mouth every 4 (four) hours as needed for cough (chest congestion). 10/01/23   Amin, Sumayya, MD  montelukast (SINGULAIR) 10 MG tablet Take 1 tablet by mouth daily.    [provider]  nystatin cream (MYCOSTATIN) Apply 1 Application topically 2 (two) times daily. 09/04/23   [provider]  tamsulosin  (FLOMAX ) 0.4 MG CAPS capsule Take 0.4 mg by mouth daily.    [provider]    Physical Exam   Triage Vital Signs: ED Triage Vitals  Encounter Vitals Group     BP 03/16/24 2138 121/62     Girls Systolic BP Percentile --      Girls Diastolic BP Percentile --      Boys Systolic BP Percentile --      Boys Diastolic BP Percentile --      Pulse Rate 03/16/24 2138 91     Resp 03/16/24 2138 18     Temp 03/16/24 2138 97.9 F (36.6 C)     Temp src --      SpO2 03/16/24 2138 93 %     Weight 03/16/24 2136 150 lb (68 kg)  Height 03/16/24 2136 5' 8 (1.727 m)     Head Circumference --      Peak Flow --      Pain Score 03/16/24 2136 3     Pain Loc --      Pain Education --      Exclude from Growth Chart --     Most recent vital signs: Vitals:   03/17/24 0319 03/17/24 0620  BP: (!) 118/47 122/80  Pulse: 77 87  Resp: 18 18  Temp:  99.8 F (37.7 C)  SpO2: 95% 92%    CONSTITUTIONAL: Alert, responds appropriately to questions. Well-appearing; well-nourished, elderly HEAD: Normocephalic, atraumatic EYES: Conjunctivae clear, pupils appear equal, sclera nonicteric ENT: normal nose; moist mucous membranes NECK: Supple, normal ROM CARD: RRR; S1 and S2 appreciated RESP: Normal chest excursion without splinting or tachypnea; breath sounds  clear and equal bilaterally; no wheezes, no rhonchi, no rales, no hypoxia or respiratory distress, speaking full sentences ABD/GI: Non-distended; soft, non-tender, no rebound, no guarding, no peritoneal signs BACK: The back appears normal EXT: Normal ROM in all joints; no deformity noted, no edema SKIN: Normal color for age and race; warm; no rash on exposed skin NEURO: Moves all extremities equally, normal speech PSYCH: The patient's mood and manner are appropriate.   ED Results / Procedures / Treatments   LABS: (all labs ordered are listed, but only abnormal results are displayed) Labs Reviewed  COMPREHENSIVE METABOLIC PANEL WITH GFR - Abnormal; Notable for the following components:      Result Value   Glucose, Bld 264 (*)    BUN 36 (*)    Creatinine, Ser 1.57 (*)    Calcium  8.7 (*)    GFR, Estimated 42 (*)    All other components within normal limits  URINALYSIS, ROUTINE W REFLEX MICROSCOPIC - Abnormal; Notable for the following components:   Color, Urine YELLOW (*)    APPearance HAZY (*)    Specific Gravity, Urine 1.031 (*)    Glucose, UA >=500 (*)    All other components within normal limits  LIPASE, BLOOD  CBC     EKG:   RADIOLOGY: My personal review and interpretation of imaging: CT of the abdomen pelvis shows no acute abnormality.  I have personally reviewed all radiology reports.   CT ABDOMEN PELVIS W CONTRAST Result Date: 03/17/2024 EXAM: CT ABDOMEN AND PELVIS WITH CONTRAST 03/17/2024 03:03:52 AM TECHNIQUE: CT of the abdomen and pelvis was performed with the administration of intravenous contrast. Multiplanar reformatted images are provided for review. Automated exposure control, iterative reconstruction, and/or weight based adjustment of the mA/kV was utilized to reduce the radiation dose to as low as reasonably achievable. COMPARISON: 08/08/2018 CLINICAL HISTORY: Abdominal pain, acute, nonlocalized. Pt complaints of emesis that started today with associated  abdominal pain. Pt states he has had several episodes of emesis today. Denies hematemesis. FINDINGS: LOWER CHEST: Subpleural scarring with bronchiectasis in the bilateral lower lobes, postinfectious/inflammatory. LIVER: Two right hepatic cysts measuring up to 14 mm, benign. Status post cholecystectomy. GALLBLADDER AND BILE DUCTS: Status post cholecystectomy. Mild pneumobilia. SPLEEN: No acute abnormality. PANCREAS: No acute abnormality. ADRENAL GLANDS: No acute abnormality. KIDNEYS, URETERS AND BLADDER: Bilateral simple lower pole renal cysts measuring up to 2.8 cm on the right (image 87), benign (Bosniak 1). No follow up is recommended. No stones in the kidneys or ureters. No hydronephrosis. No perinephric or periureteral stranding. Moderately distended bladder. GI AND BOWEL: Tiny hiatal hernia. Appendix is not discretely visualized. No bowel obstruction. No bowel wall  thickening. PERITONEUM AND RETROPERITONEUM: No ascites. No free air. VASCULATURE: Atherosclerotic calcifications of the abdominal aorta and branch vessels, although patent. LYMPH NODES: No lymphadenopathy. REPRODUCTIVE ORGANS: Prostatomegaly, with enlargement of the central gland indenting the base of the bladder, suggesting BPH. BONES AND SOFT TISSUES: Degenerative changes of the lumbar spine. No acute osseous abnormality. No focal soft tissue abnormality. IMPRESSION: 1. No acute findings in the abdomen or pelvis. 2. Stable ancillary findings, as above. Electronically signed by: Pinkie Pebbles MD 03/17/2024 03:14 AM EDT RP Workstation: [id number]     PROCEDURES:  Critical Care performed: No     Procedures    IMPRESSION / MDM / ASSESSMENT AND PLAN / ED COURSE  I reviewed the triage vital signs and the nursing notes.    Patient here with vomiting, diarrhea.  No abdominal pain, chest pain, fever.     DIFFERENTIAL DIAGNOSIS (includes but not limited to):   Viral gastroenteritis, colitis, bowel obstruction, diverticulitis,  appendicitis, UTI, kidney stone, pyelonephritis, dehydration   Patient's presentation is most consistent with acute presentation with potential threat to life or bodily function.   PLAN: Patient's labs show no leukocytosis.  Normal electrolytes.  Mild elevation of creatinine at 1.57.  Was 0.97 in February.  Likely from dehydration from GI loss.  Urine shows no sign of infection.  Will obtain CT of the abdomen pelvis given patient's age.  Will give IV fluids, antiemetics.   MEDICATIONS GIVEN IN ED: Medications  ondansetron  (ZOFRAN -ODT) disintegrating tablet 4 mg (has no administration in time range)  ondansetron  (ZOFRAN ) injection 4 mg (4 mg Intravenous Given 03/17/24 0224)  sodium chloride  0.9 % bolus 1,000 mL (0 mLs Intravenous Stopped 03/17/24 0600)  iohexol  (OMNIPAQUE ) 300 MG/ML solution 80 mL (80 mLs Intravenous Contrast Given 03/17/24 0258)     ED COURSE: CT scan reviewed and interpreted by myself and the radiologist and shows no acute abnormality.  Patient feeling better.  Does have a low-grade temperature here of 99.8 but no other signs of symptoms of sepsis.  Suspect viral gastroenteritis.  Discussed supportive care instructions, return precautions.  He is tolerating p.o. here.  Will discharge with prescription of Zofran .  Recommended follow-up with his doctor to have renal function rechecked in 1 week.   At this time, I do not feel there is any life-threatening condition present. I reviewed all nursing notes, vitals, pertinent previous records.  All lab and urine results, EKGs, imaging ordered have been independently reviewed and interpreted by myself.  I reviewed all available radiology reports from any imaging ordered this visit.  Based on my assessment, I feel the patient is safe to be discharged home without further emergent workup and can continue workup as an outpatient as needed. Discussed all findings, treatment plan as well as usual and customary return precautions.  They  verbalize understanding and are comfortable with this plan.  Outpatient follow-up has been provided as needed.  All questions have been answered.    CONSULTS:  none   OUTSIDE RECORDS REVIEWED: Reviewed last internal medicine visit on 02/26/2024.       FINAL CLINICAL IMPRESSION(S) / ED DIAGNOSES   Final diagnoses:  Nausea vomiting and diarrhea  AKI (acute kidney injury) (HCC)     Rx / DC Orders   ED Discharge Orders          Ordered    ondansetron  (ZOFRAN -ODT) 4 MG disintegrating tablet  Every 6 hours PRN        03/17/24 0342  Note:  This document was prepared using Dragon voice recognition software and may include unintentional dictation errors.   Arneta Mahmood, Josette SAILOR, DO 03/17/24 [phone]

## 2024-03-17 NOTE — Discharge Instructions (Addendum)
 Your blood work today showed mildly elevated renal function which is likely from dehydration from vomiting and diarrhea.  I suspect your symptoms are due to a viral illness.  CT scan of your abdomen and pelvis showed no acute abnormality.  The rest of your blood work was reassuring.  Urine showed no sign of infection.  I recommend a bland diet for the next 2 to 3 days and increase fluid intake.  Please follow-up with your primary care doctor in 1 week to have blood work rechecked or sooner if symptoms persist.  Please return the emergency department if you begin having vomiting that does not stop, severe abdominal pain, black and tarry stools, blood in your stools, any other symptom concerning to you.  You may use over-the-counter Imodium as needed for diarrhea.

## 2024-03-17 NOTE — ED Notes (Signed)
 Pt doesn't have a ride until in the morning. Unk time

## 2024-03-30 ENCOUNTER — Other Ambulatory Visit: Payer: Self-pay

## 2024-03-30 ENCOUNTER — Inpatient Hospital Stay (HOSPITAL_COMMUNITY)
Admit: 2024-03-30 | Discharge: 2024-03-30 | Disposition: A | Discharge status: Home / Self Care | Attending: Student in an Organized Health Care Education/Training Program | Admitting: Student in an Organized Health Care Education/Training Program

## 2024-03-30 ENCOUNTER — Emergency Department

## 2024-03-30 ENCOUNTER — Inpatient Hospital Stay
Admission: EM | Admit: 2024-03-30 | Discharge: 2024-03-31 | DRG: 871 | Disposition: A | Discharge status: Home / Self Care | Attending: Internal Medicine | Admitting: Internal Medicine

## 2024-03-30 DIAGNOSIS — J470 Bronchiectasis with acute lower respiratory infection: Secondary | ICD-10-CM | POA: Diagnosis present

## 2024-03-30 DIAGNOSIS — Z7984 Long term (current) use of oral hypoglycemic drugs: Secondary | ICD-10-CM

## 2024-03-30 DIAGNOSIS — Z87891 Personal history of nicotine dependence: Secondary | ICD-10-CM | POA: Diagnosis not present

## 2024-03-30 DIAGNOSIS — E876 Hypokalemia: Secondary | ICD-10-CM | POA: Diagnosis present

## 2024-03-30 DIAGNOSIS — J471 Bronchiectasis with (acute) exacerbation: Secondary | ICD-10-CM | POA: Diagnosis present

## 2024-03-30 DIAGNOSIS — N179 Acute kidney failure, unspecified: Secondary | ICD-10-CM | POA: Diagnosis present

## 2024-03-30 DIAGNOSIS — J479 Bronchiectasis, uncomplicated: Secondary | ICD-10-CM | POA: Diagnosis not present

## 2024-03-30 DIAGNOSIS — I2510 Atherosclerotic heart disease of native coronary artery without angina pectoris: Secondary | ICD-10-CM | POA: Diagnosis present

## 2024-03-30 DIAGNOSIS — R197 Diarrhea, unspecified: Secondary | ICD-10-CM | POA: Diagnosis present

## 2024-03-30 DIAGNOSIS — E43 Unspecified severe protein-calorie malnutrition: Secondary | ICD-10-CM | POA: Diagnosis present

## 2024-03-30 DIAGNOSIS — J440 Chronic obstructive pulmonary disease with acute lower respiratory infection: Secondary | ICD-10-CM | POA: Diagnosis present

## 2024-03-30 DIAGNOSIS — R0609 Other forms of dyspnea: Secondary | ICD-10-CM

## 2024-03-30 DIAGNOSIS — E1165 Type 2 diabetes mellitus with hyperglycemia: Secondary | ICD-10-CM | POA: Diagnosis present

## 2024-03-30 DIAGNOSIS — R6521 Severe sepsis with septic shock: Secondary | ICD-10-CM | POA: Diagnosis present

## 2024-03-30 DIAGNOSIS — E119 Type 2 diabetes mellitus without complications: Secondary | ICD-10-CM | POA: Diagnosis not present

## 2024-03-30 DIAGNOSIS — Z9049 Acquired absence of other specified parts of digestive tract: Secondary | ICD-10-CM

## 2024-03-30 DIAGNOSIS — R1111 Vomiting without nausea: Secondary | ICD-10-CM | POA: Diagnosis present

## 2024-03-30 DIAGNOSIS — Z681 Body mass index (BMI) 19 or less, adult: Secondary | ICD-10-CM

## 2024-03-30 DIAGNOSIS — Z79899 Other long term (current) drug therapy: Secondary | ICD-10-CM

## 2024-03-30 DIAGNOSIS — I5022 Chronic systolic (congestive) heart failure: Secondary | ICD-10-CM | POA: Diagnosis present

## 2024-03-30 DIAGNOSIS — J9601 Acute respiratory failure with hypoxia: Secondary | ICD-10-CM | POA: Diagnosis present

## 2024-03-30 DIAGNOSIS — Z888 Allergy status to other drugs, medicaments and biological substances status: Secondary | ICD-10-CM

## 2024-03-30 DIAGNOSIS — J441 Chronic obstructive pulmonary disease with (acute) exacerbation: Secondary | ICD-10-CM | POA: Diagnosis present

## 2024-03-30 DIAGNOSIS — Z825 Family history of asthma and other chronic lower respiratory diseases: Secondary | ICD-10-CM

## 2024-03-30 DIAGNOSIS — A419 Sepsis, unspecified organism: Principal | ICD-10-CM | POA: Diagnosis present

## 2024-03-30 DIAGNOSIS — Z66 Do not resuscitate: Secondary | ICD-10-CM | POA: Diagnosis present

## 2024-03-30 DIAGNOSIS — J189 Pneumonia, unspecified organism: Secondary | ICD-10-CM | POA: Diagnosis present

## 2024-03-30 DIAGNOSIS — Z955 Presence of coronary angioplasty implant and graft: Secondary | ICD-10-CM | POA: Diagnosis not present

## 2024-03-30 LAB — URINALYSIS, W/ REFLEX TO CULTURE (INFECTION SUSPECTED)
Bilirubin Urine: NEGATIVE
Glucose, UA: >=500 mg/dL — AB
Hgb urine dipstick: NEGATIVE
Ketones, ur: 5 mg/dL — AB
Leukocytes,Ua: NEGATIVE
Nitrite: NEGATIVE
Protein, ur: NEGATIVE mg/dL
RBC / HPF: 0 RBC/hpf (ref 0–5)
Specific Gravity, Urine: 1.016 (ref 1.005–1.030)
pH: 5 (ref 5.0–8.0)

## 2024-03-30 LAB — BASIC METABOLIC PANEL WITH GFR
Anion gap: 15 (ref 5–15)
Anion gap: 13 (ref 5–15)
Anion gap: 9 (ref 5–15)
BUN: 32 mg/dL — ABNORMAL HIGH (ref 8–23)
BUN: 28 mg/dL — ABNORMAL HIGH (ref 8–23)
BUN: 25 mg/dL — ABNORMAL HIGH (ref 8–23)
CO2: 23 mmol/L (ref 22–32)
CO2: 22 mmol/L (ref 22–32)
CO2: 26 mmol/L (ref 22–32)
Calcium: 8.4 mg/dL — ABNORMAL LOW (ref 8.9–10.3)
Calcium: 7.9 mg/dL — ABNORMAL LOW (ref 8.9–10.3)
Calcium: 8.1 mg/dL — ABNORMAL LOW (ref 8.9–10.3)
Chloride: 101 mmol/L (ref 98–111)
Chloride: 104 mmol/L (ref 98–111)
Chloride: 103 mmol/L (ref 98–111)
Creatinine, Ser: 1.61 mg/dL — ABNORMAL HIGH (ref 0.61–1.24)
Creatinine, Ser: 1.32 mg/dL — ABNORMAL HIGH (ref 0.61–1.24)
Creatinine, Ser: 1.21 mg/dL (ref 0.61–1.24)
GFR, Estimated: 40 mL/min — ABNORMAL LOW (ref >60)
GFR, Estimated: 51 mL/min — ABNORMAL LOW (ref >60)
GFR, Estimated: 57 mL/min — ABNORMAL LOW (ref >60)
Glucose, Bld: 243 mg/dL — ABNORMAL HIGH (ref 70–99)
Glucose, Bld: 231 mg/dL — ABNORMAL HIGH (ref 70–99)
Glucose, Bld: 229 mg/dL — ABNORMAL HIGH (ref 70–99)
Potassium: 3.1 mmol/L — ABNORMAL LOW (ref 3.5–5.1)
Potassium: 3.5 mmol/L (ref 3.5–5.1)
Potassium: 4.2 mmol/L (ref 3.5–5.1)
Sodium: 139 mmol/L (ref 135–145)
Sodium: 139 mmol/L (ref 135–145)
Sodium: 138 mmol/L (ref 135–145)

## 2024-03-30 LAB — CBC WITH DIFFERENTIAL/PLATELET
Abs Immature Granulocytes: 0.07 K/uL (ref 0.00–0.07)
Basophils Absolute: 0 K/uL (ref 0.0–0.1)
Basophils Relative: 0 %
Eosinophils Absolute: 0.2 K/uL (ref 0.0–0.5)
Eosinophils Relative: 1 %
HCT: 40.4 % (ref 39.0–52.0)
Hemoglobin: 13.3 g/dL (ref 13.0–17.0)
Immature Granulocytes: 1 %
Lymphocytes Relative: 9 %
Lymphs Abs: 1.3 K/uL (ref 0.7–4.0)
MCH: 30.8 pg (ref 26.0–34.0)
MCHC: 32.9 g/dL (ref 30.0–36.0)
MCV: 93.5 fL (ref 80.0–100.0)
Monocytes Absolute: 0.8 K/uL (ref 0.1–1.0)
Monocytes Relative: 5 %
Neutro Abs: 12.9 K/uL — ABNORMAL HIGH (ref 1.7–7.7)
Neutrophils Relative %: 84 %
Platelets: 242 K/uL (ref 150–400)
RBC: 4.32 MIL/uL (ref 4.22–5.81)
RDW: 13.8 % (ref 11.5–15.5)
WBC: 15.4 K/uL — ABNORMAL HIGH (ref 4.0–10.5)
nRBC: 0 % (ref 0.0–0.2)

## 2024-03-30 LAB — CBC
HCT: 39.6 % (ref 39.0–52.0)
Hemoglobin: 13 g/dL (ref 13.0–17.0)
MCH: 31 pg (ref 26.0–34.0)
MCHC: 32.8 g/dL (ref 30.0–36.0)
MCV: 94.5 fL (ref 80.0–100.0)
Platelets: 231 K/uL (ref 150–400)
RBC: 4.19 MIL/uL — ABNORMAL LOW (ref 4.22–5.81)
RDW: 13.8 % (ref 11.5–15.5)
WBC: 17.2 K/uL — ABNORMAL HIGH (ref 4.0–10.5)
nRBC: 0 % (ref 0.0–0.2)

## 2024-03-30 LAB — RESPIRATORY PANEL BY PCR

## 2024-03-30 LAB — BRAIN NATRIURETIC PEPTIDE: B Natriuretic Peptide: 171.2 pg/mL — ABNORMAL HIGH (ref 0.0–100.0)

## 2024-03-30 LAB — MAGNESIUM: Magnesium: 1.9 mg/dL (ref 1.7–2.4)

## 2024-03-30 LAB — RESP PANEL BY RT-PCR (RSV, FLU A&B, COVID)  RVPGX2
Influenza A by PCR: NEGATIVE
Influenza B by PCR: NEGATIVE
Resp Syncytial Virus by PCR: NEGATIVE
SARS Coronavirus 2 by RT PCR: NEGATIVE

## 2024-03-30 LAB — STREP PNEUMONIAE URINARY ANTIGEN: Strep Pneumo Urinary Antigen: NEGATIVE

## 2024-03-30 LAB — GLUCOSE, CAPILLARY
Glucose-Capillary: 207 mg/dL — ABNORMAL HIGH (ref 70–99)
Glucose-Capillary: 210 mg/dL — ABNORMAL HIGH (ref 70–99)
Glucose-Capillary: 196 mg/dL — ABNORMAL HIGH (ref 70–99)
Glucose-Capillary: 250 mg/dL — ABNORMAL HIGH (ref 70–99)
Glucose-Capillary: 269 mg/dL — ABNORMAL HIGH (ref 70–99)
Glucose-Capillary: 192 mg/dL — ABNORMAL HIGH (ref 70–99)
Glucose-Capillary: 94 mg/dL (ref 70–99)

## 2024-03-30 LAB — HEMOGLOBIN A1C
Hgb A1c MFr Bld: 9.6 % — ABNORMAL HIGH (ref 4.8–5.6)
Mean Plasma Glucose: 229 mg/dL

## 2024-03-30 LAB — TROPONIN I (HIGH SENSITIVITY)
Troponin I (High Sensitivity): 25 ng/L — ABNORMAL HIGH
Troponin I (High Sensitivity): 30 ng/L — ABNORMAL HIGH (ref <18)

## 2024-03-30 LAB — PHOSPHORUS: Phosphorus: 3.6 mg/dL (ref 2.5–4.6)

## 2024-03-30 LAB — MRSA NEXT GEN BY PCR, NASAL: MRSA by PCR Next Gen: NOT DETECTED

## 2024-03-30 LAB — LACTIC ACID, PLASMA: Lactic Acid, Venous: 1.3 mmol/L (ref 0.5–1.9)

## 2024-03-30 LAB — PROCALCITONIN: Procalcitonin: 0.27 ng/mL

## 2024-03-30 MED ORDER — ADULT MULTIVITAMIN W/MINERALS CH
1.0000 | ORAL_TABLET | Freq: Every day | ORAL | Status: DC
  Administered 2024-03-31: 1 via ORAL
  Filled 2024-03-30: qty 1

## 2024-03-30 MED ORDER — PANTOPRAZOLE SODIUM 40 MG IV SOLR
40.0000 mg | INTRAVENOUS | Status: DC
  Administered 2024-03-30: 40 mg via INTRAVENOUS
  Filled 2024-03-30: qty 10

## 2024-03-30 MED ORDER — DOCUSATE SODIUM 100 MG PO CAPS: 100.0000 mg | ORAL_CAPSULE | Freq: Two times a day (BID) | ORAL | Status: DC | PRN

## 2024-03-30 MED ORDER — CHLORHEXIDINE GLUCONATE CLOTH 2 % EX PADS
6.0000 | MEDICATED_PAD | Freq: Every day | CUTANEOUS | Status: DC
  Administered 2024-03-30: 6 via TOPICAL

## 2024-03-30 MED ORDER — IPRATROPIUM-ALBUTEROL 0.5-2.5 (3) MG/3ML IN SOLN
3.0000 mL | Freq: Once | RESPIRATORY_TRACT | Status: AC
  Administered 2024-03-30: 3 mL via RESPIRATORY_TRACT
  Filled 2024-03-30: qty 3

## 2024-03-30 MED ORDER — SODIUM CHLORIDE 0.9 % IV SOLN
2.0000 g | Freq: Every day | INTRAVENOUS | Status: DC
  Administered 2024-03-30 – 2024-03-31 (×2): 2 g via INTRAVENOUS
  Filled 2024-03-30 (×3): qty 2

## 2024-03-30 MED ORDER — SODIUM CHLORIDE 0.9 % IV SOLN
2.0000 g | Freq: Once | INTRAVENOUS | Status: AC
  Administered 2024-03-30: 2 g via INTRAVENOUS
  Filled 2024-03-30: qty 20

## 2024-03-30 MED ORDER — IPRATROPIUM-ALBUTEROL 0.5-2.5 (3) MG/3ML IN SOLN: 3.0000 mL | Freq: Four times a day (QID) | RESPIRATORY_TRACT | Status: DC | PRN

## 2024-03-30 MED ORDER — SODIUM CHLORIDE 0.9 % IV SOLN
500.0000 mg | INTRAVENOUS | Status: DC
  Administered 2024-03-31: 500 mg via INTRAVENOUS
  Filled 2024-03-30 (×2): qty 5

## 2024-03-30 MED ORDER — CHLORHEXIDINE GLUCONATE CLOTH 2 % EX PADS
6.0000 | MEDICATED_PAD | Freq: Every day | CUTANEOUS | Status: DC
  Administered 2024-03-30: 6 via TOPICAL
  Filled 2024-03-30: qty 6

## 2024-03-30 MED ORDER — ORAL CARE MOUTH RINSE: 15.0000 mL | OROMUCOSAL | Status: DC | PRN

## 2024-03-30 MED ORDER — IPRATROPIUM-ALBUTEROL 0.5-2.5 (3) MG/3ML IN SOLN
3.0000 mL | Freq: Four times a day (QID) | RESPIRATORY_TRACT | Status: DC
  Administered 2024-03-30: 3 mL via RESPIRATORY_TRACT
  Filled 2024-03-30: qty 3

## 2024-03-30 MED ORDER — POLYETHYLENE GLYCOL 3350 17 G PO PACK: 17.0000 g | PACK | Freq: Every day | ORAL | Status: DC | PRN

## 2024-03-30 MED ORDER — HEPARIN SODIUM (PORCINE) 5000 UNIT/ML IJ SOLN
5000.0000 [IU] | Freq: Three times a day (TID) | INTRAMUSCULAR | Status: DC
  Administered 2024-03-30 – 2024-03-31 (×5): 5000 [IU] via SUBCUTANEOUS
  Filled 2024-03-30 (×5): qty 1

## 2024-03-30 MED ORDER — ENSURE PLUS HIGH PROTEIN PO LIQD
237.0000 mL | Freq: Three times a day (TID) | ORAL | Status: DC
  Administered 2024-03-30 – 2024-03-31 (×3): 237 mL via ORAL

## 2024-03-30 MED ORDER — POTASSIUM CHLORIDE 10 MEQ/100ML IV SOLN
10.0000 meq | INTRAVENOUS | Status: AC
  Administered 2024-03-30 (×4): 10 meq via INTRAVENOUS
  Filled 2024-03-30 (×4): qty 100

## 2024-03-30 MED ORDER — NOREPINEPHRINE 4 MG/250ML-% IV SOLN
0.0000 ug/min | INTRAVENOUS | Status: DC
  Administered 2024-03-30: 2 ug/min via INTRAVENOUS
  Filled 2024-03-30: qty 250

## 2024-03-30 MED ORDER — SODIUM CHLORIDE 0.9 % IV SOLN
500.0000 mg | Freq: Once | INTRAVENOUS | Status: AC
  Administered 2024-03-30: 500 mg via INTRAVENOUS
  Filled 2024-03-30: qty 5

## 2024-03-30 MED ORDER — CEFTAZIDIME 2 G IV SOLR: 2.0000 g | Freq: Every day | INTRAVENOUS | Status: DC

## 2024-03-30 MED ORDER — SODIUM CHLORIDE 0.9 % IV SOLN: 1.0000 g | INTRAVENOUS | Status: DC

## 2024-03-30 MED ORDER — IPRATROPIUM-ALBUTEROL 0.5-2.5 (3) MG/3ML IN SOLN
3.0000 mL | Freq: Three times a day (TID) | RESPIRATORY_TRACT | Status: DC
  Administered 2024-03-30 – 2024-03-31 (×3): 3 mL via RESPIRATORY_TRACT
  Filled 2024-03-30 (×4): qty 3

## 2024-03-30 MED ORDER — LACTATED RINGERS IV SOLN: INTRAVENOUS | Status: AC

## 2024-03-30 MED ORDER — INSULIN ASPART 100 UNIT/ML IJ SOLN
0.0000 [IU] | INTRAMUSCULAR | Status: DC
  Administered 2024-03-30: 4 [IU] via SUBCUTANEOUS
  Administered 2024-03-30: 7 [IU] via SUBCUTANEOUS
  Administered 2024-03-30: 4 [IU] via SUBCUTANEOUS
  Administered 2024-03-30: 7 [IU] via SUBCUTANEOUS
  Administered 2024-03-31: 3 [IU] via SUBCUTANEOUS
  Administered 2024-03-31 (×2): 4 [IU] via SUBCUTANEOUS
  Filled 2024-03-30 (×7): qty 1

## 2024-03-30 MED ORDER — METHYLPREDNISOLONE SODIUM SUCC 40 MG IJ SOLR
40.0000 mg | Freq: Two times a day (BID) | INTRAMUSCULAR | Status: DC
  Administered 2024-03-30: 40 mg via INTRAVENOUS
  Filled 2024-03-30: qty 1

## 2024-03-30 MED ORDER — LACTATED RINGERS IV BOLUS (SEPSIS)
1000.0000 mL | Freq: Once | INTRAVENOUS | Status: AC
  Administered 2024-03-30: 1000 mL via INTRAVENOUS

## 2024-03-30 MED ORDER — METHYLPREDNISOLONE SODIUM SUCC 40 MG IJ SOLR
40.0000 mg | INTRAMUSCULAR | Status: DC
  Administered 2024-03-31: 40 mg via INTRAVENOUS
  Filled 2024-03-30: qty 1

## 2024-03-30 MED ORDER — MAGNESIUM SULFATE 2 GM/50ML IV SOLN
2.0000 g | Freq: Once | INTRAVENOUS | Status: AC
  Administered 2024-03-30: 2 g via INTRAVENOUS
  Filled 2024-03-30: qty 50

## 2024-03-30 MED ORDER — ONDANSETRON HCL 4 MG/2ML IJ SOLN
4.0000 mg | Freq: Once | INTRAMUSCULAR | Status: AC
  Administered 2024-03-30: 4 mg via INTRAVENOUS
  Filled 2024-03-30: qty 2

## 2024-03-30 MED ORDER — SODIUM CHLORIDE 3 % IN NEBU
4.0000 mL | INHALATION_SOLUTION | Freq: Two times a day (BID) | RESPIRATORY_TRACT | Status: DC
  Administered 2024-03-30 – 2024-03-31 (×2): 4 mL via RESPIRATORY_TRACT
  Filled 2024-03-30 (×4): qty 4

## 2024-03-30 MED ORDER — MIDODRINE HCL 5 MG PO TABS
10.0000 mg | ORAL_TABLET | Freq: Once | ORAL | Status: AC
  Administered 2024-03-30: 10 mg via ORAL
  Filled 2024-03-30: qty 2

## 2024-03-30 MED ORDER — SODIUM CHLORIDE 0.9 % IV BOLUS (SEPSIS)
1000.0000 mL | Freq: Once | INTRAVENOUS | Status: AC
  Administered 2024-03-30: 1000 mL via INTRAVENOUS

## 2024-03-30 NOTE — Hospital Course (Signed)
 COPD on as needed oxygen , bronchiectasis, type 2 diabetes, HFrEF, EF 30 to 35%, CAD with prior stenting, who was hospitalized in February with aspiration pneumonia, presents by EMS with shortness of breath, nausea and vomiting, requiring 6 L O2 via nasal cannula for transport due to O2 sats in the 80s.

## 2024-03-30 NOTE — Progress Notes (Signed)
 PHARMACY CONSULT NOTE - FOLLOW UP  Pharmacy Consult for Electrolyte Monitoring and Replacement   Recent Labs: Potassium (mmol/L)  Date Value  03/30/2024 3.1 (L)  05/18/2014 3.6   Magnesium  (mg/dL)  Date Value  90/98/7975 2.6 (H)   Calcium  (mg/dL)  Date Value  91/95/7974 8.4 (L)   Calcium , Total (mg/dL)  Date Value  90/77/7984 8.3 (L)   Albumin (g/dL)  Date Value  92/78/7974 3.5  09/21/2013 3.2 (L)   Phosphorus (mg/dL)  Date Value  90/98/7975 2.4 (L)   Sodium (mmol/L)  Date Value  03/30/2024 139  05/18/2014 135 (L)     Assessment: 8/4 @ 0150:  K = 3.1                      Ca = 8.4, Alb = 3.5, Corrected Ca = 8.8   Goal of Therapy:  Electrolytes WNL   Plan:  Will order KCl 10 mEq IV X 4 and recheck electrolytes @ 1800.   Silver Selinda BIRCH ,PharmD Clinical Pharmacist 03/30/2024 6:16 AM

## 2024-03-30 NOTE — ED Notes (Signed)
 Notified EDP Ward of pt low MP readings post bolus

## 2024-03-30 NOTE — Progress Notes (Signed)
 Patient is A&O X's 4, admitted to ICU-03 with sepsis d/t PNA. Patient is from home, alone and baseline on room air. Levophed  is being titrated to maintain a MAP of 65. Patient is currently on oxygen  at 5 L/min. Patient denies pain since admit. Voids using urinal per patient.  High fall risk, call alarm within reach. Patient instructed to call for assistance and verbalized understanding.

## 2024-03-30 NOTE — Progress Notes (Signed)
 Pt transferred. VSS. 2 L . AxOx4. All patient belongings kept at bedside transferred with patient.

## 2024-03-30 NOTE — Sepsis Progress Note (Signed)
 Elink following code sepsis

## 2024-03-30 NOTE — H&P (Signed)
 NAME:  George Hensley, MRN:  [id number], DOB:  Nov 29, 1930, LOS: 0 ADMISSION DATE:  03/30/2024, CONSULTATION DATE: 03/30/2024 REFERRING FI:Xmpduzw, Ward, CHIEF COMPLAINT: Shortness of breath, nausea and vomiting and vomiting  HPI  89 y.o male with significant PMH of COPD, T2DM, CAD, and HFrEF, EF 30 to 35% who presented to the ED with chief complaints of shortness of breath and N/V.  Patient called EMS for worsening shortness of breath, on EMS arrival patient was 88% on room air with notable wheezing.  He was treated with DuoNebs, Solu-Medrol  with improvement in sats to 98% on 6 L nasal cannula.   ED Course: Initial vital signs showed HR of 86 beats/minute, BP 109/50 mm Hg, the RR 22 breaths/minute, and the oxygen  saturation 98 % on 6 L and a temperature of 98.20F (36.7C). Pertinent Labs/Diagnostics Findings: Na+/ K+: 139/3.1 glucose: 243 BUN/Cr.:82/1.61  WBC: 15.4 K/L PCT: 0.27 Lactic acid: 1.3 COVID PCR: Negative,  troponin:25   BNP: 171 CXR>Findings consistent with acute infectious or inflammatory process, possibly atypical organism.  Patient given 30 cc/kg of fluids and started on broad-spectrum antibiotics ceftriaxone  and azithromycin  for sepsis with septic shock due to pneumonia. Patient remained hypotensive despite IVF boluses therefore was started on Levophed . PCCM consulted.  Past Medical History  COPD, T2DM, CAD, and HFrEF, EF 30 to 35%  Significant Hospital Events   8/4: Admitted to ICU with sepsis with septic shock secondary to pneumonia  Consults:  None  Procedures:  None  Interim History / Subjective:      Micro Data:  8/4: SARS-CoV-2 PCR> negative 8/4: Influenza PCR> negative 8/4: RVP> 8/4: Blood culture x2> 8/4: MRSA PCR>>  8/4: Strep pneumo urinary antigen> 8/4: Legionella urinary antigen>  Antimicrobials:  Azithromycin  8/3>> Ceftriaxone  8/3>>  OBJECTIVE  Blood pressure 127/66, pulse 77, temperature 98 F (36.7 C), temperature source Oral, resp.  rate (!) 28, height 5' 8 (1.727 m), weight 50.1 kg, SpO2 93%.        Intake/Output Summary (Last 24 hours) at 03/30/2024 0601 Last data filed at 03/30/2024 0550 Gross per 24 hour  Intake 2578.26 ml  Output --  Net 2578.26 ml   Filed Weights   03/30/24 0139  Weight: 50.1 kg   Physical Examination  GENERAL:89 year-old critically ill patient lying in the bed in no acute distress EYES: PEERLA. No scleral icterus. Extraocular muscles intact.  HEENT: Head atraumatic, normocephalic. Oropharynx and nasopharynx clear.  CARDIOVASCULAR:S1, S2 normal. No murmurs, rubs, or gallops PULMONARY: Lungs sounds moderate wheezing ABDOMINAL: Soft, NTND MUSCULOSKELETAL: No swelling. Normal range of motion. 5/5 strength in all extremities.   NEUROLOGICAL: General: No focal deficit present. A&O x4 PSYCHIATRIC:  Mood and Affect: Mood normal.  SKIN:Skin is warm and dry. No obvious rash, lesion, or ulcer. Capillary Refill:< 2sec   Labs/imaging that I havepersonally reviewed  (right click and Reselect all SmartList Selections daily)   DG Chest Portable 1 View Result Date: 03/30/2024 CLINICAL DATA:  Shortness of breath EXAM: PORTABLE CHEST 1 VIEW COMPARISON:  09/29/2023, 08/08/2018, 04/25/2023 FINDINGS: Patchy interstitial opacities in the mid to lower lungs, slightly increased compared to 09/29/2023 and suggestive of acute infectious or inflammatory process, possibly atypical organism. Normal cardiac size. No pneumothorax IMPRESSION: Patchy interstitial opacities in the mid to lower lungs, slightly increased compared to 09/29/2023 and suggestive of acute infectious or inflammatory process, possibly atypical organism. Electronically Signed   By: Luke Bun M.D.   On: 03/30/2024 02:11     Labs   CBC: Recent  Labs  Lab 03/30/24 0150  WBC 15.4*  NEUTROABS 12.9*  HGB 13.3  HCT 40.4  MCV 93.5  PLT 242    Basic Metabolic Panel: Recent Labs  Lab 03/30/24 0150  NA 139  K 3.1*  CL 101  CO2 23   GLUCOSE 243*  BUN 32*  CREATININE 1.61*  CALCIUM  8.4*   GFR: Estimated Creatinine Clearance: 21.6 mL/min (A) (by C-G formula based on SCr of 1.61 mg/dL (H)). Recent Labs  Lab 03/30/24 0150 03/30/24 0237  PROCALCITON 0.27  --   WBC 15.4*  --   LATICACIDVEN  --  1.3    Liver Function Tests: No results for input(s): AST, ALT, ALKPHOS, BILITOT, PROT, ALBUMIN in the last 168 hours. No results for input(s): LIPASE, AMYLASE in the last 168 hours. No results for input(s): AMMONIA in the last 168 hours.  ABG    Component Value Date/Time   PHART 7.37 05/29/2018 0338   PCO2ART 50 (H) 05/29/2018 0338   PO2ART 106 05/29/2018 0338   HCO3 23.0 07/31/2020 1040   TCO2 30 06/14/2008 0944   ACIDBASEDEF 4.2 (H) 07/31/2020 1040   O2SAT 66.4 07/31/2020 1040     Coagulation Profile: No results for input(s): INR, PROTIME in the last 168 hours.  Cardiac Enzymes: No results for input(s): CKTOTAL, CKMB, CKMBINDEX, TROPONINI in the last 168 hours.  HbA1C: Hgb A1c MFr Bld  Date/Time Value Ref Range Status  04/26/2023 01:02 AM 8.2 (H) 4.8 - 5.6 % Final    Comment:    (NOTE) Pre diabetes:          5.7%-6.4%  Diabetes:              >6.4%  Glycemic control for   <7.0% adults with diabetes   07/31/2020 10:40 AM 7.7 (H) 4.8 - 5.6 % Final    Comment:    (NOTE) Pre diabetes:          5.7%-6.4%  Diabetes:              >6.4%  Glycemic control for   <7.0% adults with diabetes     CBG: No results for input(s): GLUCAP in the last 168 hours.  Review of Systems:   Review of Systems  Constitutional:  Positive for chills and fever.  HENT:  Positive for congestion and hearing loss.   Eyes: Negative.   Respiratory:  Positive for cough, sputum production, shortness of breath and wheezing.   Cardiovascular: Negative.   Gastrointestinal:  Positive for abdominal pain, nausea and vomiting.  Musculoskeletal: Negative.   Skin: Negative.   Neurological:  Negative.   Endo/Heme/Allergies: Negative.   Psychiatric/Behavioral: Negative.     Past Medical History  He,  has a past medical history of Asthma, Bronchiectasis (HCC), COPD (chronic obstructive pulmonary disease) (HCC), and Diabetes mellitus.   Surgical History    Past Surgical History:  Procedure Laterality Date   CHOLECYSTECTOMY  2000   CORONARY STENT PLACEMENT  1999     Social History   reports that he has never smoked. He has quit using smokeless tobacco.  His smokeless tobacco use included chew. He reports current alcohol  use. He reports that he does not use drugs.   Family History   His family history includes Asthma in his brother.   Allergies Allergies  Allergen Reactions   Metoprolol     Pt states syncope with lopressor     Home Medications  Prior to Admission medications   Medication Sig Start Date End Date Taking?  Authorizing Provider  albuterol  (VENTOLIN  HFA) 108 (90 Base) MCG/ACT inhaler Inhale 1-2 puffs into the lungs every 6 (six) hours as needed for wheezing or shortness of breath. Patient not taking: Reported on 09/30/2023    [provider]  aspirin  EC 81 MG tablet Take 81 mg by mouth daily.     [provider]  camphor-menthol VIKKI) lotion Apply 1 Application topically every 6 (six) hours as needed for itching. 09/25/23   [provider]  ciclopirox (PENLAC) 8 % solution Apply 1 Application topically daily as needed (nail fungus). 10/08/22   [provider]  diltiazem  (CARDIZEM  CD) 120 MG 24 hr capsule Take 120 mg by mouth daily. 07/16/23   [provider]  finasteride (PROSCAR) 5 MG tablet Take 5 mg by mouth daily.    [provider]  fluticasone  (FLONASE) 50 MCG/ACT nasal spray Place 1 spray into both nostrils daily. 08/13/23   [provider]  Fluticasone -Umeclidin-Vilant 100-62.5-25 MCG/ACT AEPB Inhale 1 puff into the lungs daily.    [provider]  glipiZIDE (GLUCOTROL XL) 10 MG 24  hr tablet Take 10 mg by mouth daily.    [provider]  guaiFENesin -dextromethorphan (ROBITUSSIN DM) 100-10 MG/5ML syrup Take 5 mLs by mouth every 4 (four) hours as needed for cough (chest congestion). 10/01/23   Amin, Sumayya, MD  montelukast (SINGULAIR) 10 MG tablet Take 1 tablet by mouth daily.    [provider]  nystatin cream (MYCOSTATIN) Apply 1 Application topically 2 (two) times daily. 09/04/23   [provider]  ondansetron  (ZOFRAN -ODT) 4 MG disintegrating tablet Take 1 tablet (4 mg total) by mouth every 6 (six) hours as needed for nausea or vomiting. 03/17/24   Ward, Josette SAILOR, DO  tamsulosin  (FLOMAX ) 0.4 MG CAPS capsule Take 0.4 mg by mouth daily.    [provider]  Scheduled Meds:  Chlorhexidine  Gluconate Cloth  6 each Topical QHS   heparin   5,000 Units Subcutaneous Q8H   ipratropium-albuterol   3 mL Nebulization Q6H   Continuous Infusions:  lactated ringers  150 mL/hr at 03/30/24 0550   norepinephrine  (LEVOPHED ) Adult infusion 8 mcg/min (03/30/24 0550)   potassium chloride      PRN Meds:.docusate sodium , ipratropium-albuterol , polyethylene glycol  Active Hospital Problem list   See systems below  Assessment & Plan:  #Acute Hypoxic and Respiratory Failure #CAP #Acute Exacerbation of COPD -Supplemental O2 as needed titrate to goal 88-92% -Follow intermittent Chest X-ray & ABG as needed -Bronchodilators and Pulmicort nebs -IV methylpred 60-125mg  q6-12 for 3 days transition to prednisone   -Antibiotics as below  #Sepsis due to Suspected Pneumonia Initial interventions/workup included: 2 L of NS/LR & Ceftriaxone /Azithromycin  -F/u cultures, trend lactic/ PCT -Monitor WBC/ fever curve -Obtain MRSA nasal swab, RVP, legionella, strep urine Ag -IV antibiotics Ceftriaxone  AND Azithromycin  -Gentle IVF hydration as needed -Pressors PRN for MAP goal >65 -Strict I/O's    #AKI  #Hypokalemia -F/u BMP+mag -promote renal perfusion -Avoid  nephrotoxic agents  -Replete/correct lytes -strict I&Os  #Type II Diabetes Mellitus  -Check A1c -CBG's q4hrs  -SSI -Target CBG readings 140 to 180 -Follow hypo/hyperglycemic protocol   #Chronic HFrEF -Euvolemic on exam.  -BNP (171):  -Chest XR (8/4): No findings of edema -Last ECHO (12/21): LVEF 30-35% -Home GDMT of: Diltiazem  -Strict I/Os -Obtain 2Decho   Best practice:  Diet:  Oral Pain/Anxiety/Delirium protocol (if indicated): No VAP protocol (if indicated): Not indicated DVT prophylaxis: Subcutaneous Heparin  GI prophylaxis: PPI Glucose control:  SSI Yes Central venous access:  N/A Arterial  line:  N/A Foley:  N/A Mobility:  bed rest  PT consulted: N/A Last date of multidisciplinary goals of care discussion []  Code Status:  DNR Disposition: ICU   = Goals of Care = Code Status Order: DNR/DNI  Primary Emergency Contact: Vicci Mickey Agent Patient wishes to pursue ongoing treatment, but concurred that if deteriorated to pulselessness, patient would prefer a natural death as opposed to invasive measures such as CPR and intubation.  Family should be immediately contacted in such situations.  Critical care time: 45 minutes        Almarie Nose DNP, CCRN, FNP-C, AGACNP-BC Acute Care & Family Nurse Practitioner [facility] PCCM on call pager [phone]

## 2024-03-30 NOTE — Progress Notes (Signed)
 Pharmacy Antibiotic Note  George Hensley is a 89 y.o. male admitted on 03/30/2024 with septic shock/pneumonia.  Pharmacy has been consulted for ceftazidime  dosing. Renal function impaired with Scr 1.32 mg/dL (CrCl 73.5 mL/min) that is trending down. WBC elevated at 17.2 K/uL and patient is afebrile (Temp 98 F). Currently on nasal canula 2 L/min. Vitals stable with last MAP of 73 and HR 78 in NSR without vasopressors. Notable history of pseudomonal infections per [facility] chart review. MRSA PCR negative. Currently receiving ceftriaxone  and azithromycin  (started 8/4).   Plan: Give ceftazidime  2g q24h as standard 30 minute infusion. Discontinue ceftriaxone .   Height: 5' 8 (172.7 cm) Weight: 50.1 kg (110 lb 7.2 oz) IBW/kg (Calculated) : 68.4  Temp (24hrs), Avg:98 F (36.7 C), Min:98 F (36.7 C), Max:98 F (36.7 C)  Recent Labs  Lab 03/30/24 0150 03/30/24 0237 03/30/24 0810  WBC 15.4*  --  17.2*  CREATININE 1.61*  --  1.32*  LATICACIDVEN  --  1.3  --     Estimated Creatinine Clearance: 26.4 mL/min (A) (by C-G formula based on SCr of 1.32 mg/dL (H)).    Allergies  Allergen Reactions   Metoprolol     Pt states syncope with lopressor    Antimicrobials this admission: Azithromycin  500 mg IV q24h 8/4 >>  Ceftriaxone  2g IV x 1 (8/4)   Dose adjustments this admission: Discontinued ceftriaxone  and initiated ceftazidime .   Microbiology results: 8/4 BCx: (pending) no growth to date  8/4 MRSA PCR: Negative   Thank you for allowing pharmacy to be a part of this patient's care.  Sam Wunschel [place] - PharmD Candidate  03/30/2024 10:46 AM

## 2024-03-30 NOTE — Progress Notes (Signed)
 Initial Nutrition Assessment  DOCUMENTATION CODES:   Severe malnutrition in context of chronic illness  INTERVENTION:   Ensure Plus High Protein po TID, each supplement provides 350 kcal and 20 grams of protein  MVI po daily   Liberalize diet   Pt at high refeed risk; recommend monitor potassium, magnesium  and phosphorus labs daily until stable  Daily weights   NUTRITION DIAGNOSIS:   Severe Malnutrition related to chronic illness as evidenced by percent weight loss, severe fat depletion, severe muscle depletion.  GOAL:   Patient will meet greater than or equal to 90% of their needs  MONITOR:   PO intake, Supplement acceptance, Labs, Weight trends, Skin, I & O's  REASON FOR ASSESSMENT:   Rounds    ASSESSMENT:   89 y/o male with h/o bronchiectasis, COPD, asthma, pulmonary nodule, CHF, CAD and DM who is admitted with PNA, septic shock, COPD exacerbation and AKI.  Met with pt in room today. Pt reports good appetite and oral intake at baseline. Pt reports that he ate 100% of his breakfast this morning. Pt reports that he drinks chocolate Ensure at home; RD will order this for in hospital. Pt reports weight loss pta but is unsure of how much. Per chart, pt is down 45lbs(29%) over the past 6 months if his admission weight is correct; this is severe weight loss. RD will add supplements and MVI to help pt meet his estimated needs. Pt is at high refeed risk.   Medications reviewed and include: heparin , insulin , solu-medrol , MVI, azithromycin , fortaz , LRS @100ml /hr  Labs reviewed: K 3.5 wnl, BUN 28(H), creat 1.32(H), P 3.6 wnl, Mg 1.9 wnl  Wbc- 17.2(H) Cbgs- 196, 210, 207 x 24 hrs  AIC 8.2(H)- 8/30  NUTRITION - FOCUSED PHYSICAL EXAM:  Flowsheet Row Most Recent Value  Orbital Region Moderate depletion  Upper Arm Region Severe depletion  Thoracic and Lumbar Region Severe depletion  Buccal Region Severe depletion  Temple Region Severe depletion  Clavicle Bone Region Severe  depletion  Clavicle and Acromion Bone Region Severe depletion  Scapular Bone Region Moderate depletion  Dorsal Hand Severe depletion  Patellar Region Severe depletion  Anterior Thigh Region Severe depletion  Posterior Calf Region Severe depletion  Edema (RD Assessment) None  Hair Reviewed  Eyes Reviewed  Mouth Reviewed  Skin Reviewed  Nails Reviewed   Diet Order:   Diet Order             Diet Carb Modified Fluid consistency: Thin  Diet effective now                  EDUCATION NEEDS:   Education needs have been addressed  Skin:  Skin Assessment: Reviewed RN Assessment (ecchymosis)  Last BM:  8/3  Height:   Ht Readings from Last 1 Encounters:  03/30/24 5' 8 (1.727 m)    Weight:   Wt Readings from Last 1 Encounters:  03/30/24 50.1 kg    Ideal Body Weight:  70 kg  BMI:  Body mass index is 16.79 kg/m.  Estimated Nutritional Needs:   Kcal:  1600-1800kcal/day  Protein:  80-90g/day  Fluid:  1.4-1.6L/day  Augustin Shams MS, RD, LDN If unable to be reached, please send secure chat to RD inpatient available from 8:00a-4:00p daily

## 2024-03-30 NOTE — Progress Notes (Signed)
 eLink Physician-Brief Progress Note Patient Name: George Hensley DOB: 04/29/1931 MRN: [id number]   Date of Service  03/30/2024  HPI/Events of Note  89 y.o. male with history of COPD, asthma, bronchiectasis, CAD status post stent, diabetes   In ICU for  Septic shock from AHRF-Pneumonia. COPD exacerbation. AKI, low potassium. . Watch for low EF CHF exacerbation.   Camera: On nasal o2. Able to protect his airways. HR 78 Sast 94%  MAP 71. He is awake and alert.  Levophed  at 6mcg/min On IV fluids.   Data: Reviewed Hypokalemia  Cr 1.61 Trop falt rise from 25 to 30 LA 1.3 Wbc 15 K Covid/flu neg CxR image seen: multifocal bronchopneumonia. No effusion. COPD lung. Narrow heart.  EKG reviewed. PVC's. Qtc 480  2021: EF 35 %.   eICU Interventions  DNR/DNI NPO Asp precautions CAP coverage VTE sq heparin  On ssi goals < 180.  Nebs Wean levo as tolerated.       Intervention Category Major Interventions: Sepsis - evaluation and management;Respiratory failure - evaluation and management Evaluation Type: New Patient Evaluation  Jodelle ONEIDA Hutching 03/30/2024, 6:10 AM

## 2024-03-30 NOTE — ED Triage Notes (Signed)
 Pt arrives from home via [facility], C/O shortness of breath and N/V.  COPD at baseline, pt states this level of SHOB has not happened before.  Per EMS,  89% on RA, junky and wheezy w/bronchospasms albuterol , duonebs, solumedrol given, sats up to 98% on 6LNC.  Pt Aox4 on arrival and speaking in complete sentences. Zofran  given by EMS, pt denies nausea at this time. RA at baseline.

## 2024-03-30 NOTE — Progress Notes (Signed)
*  PRELIMINARY RESULTS* Echocardiogram 2D Echocardiogram has been performed.  Floydene Harder 03/30/2024, 4:40 PM

## 2024-03-30 NOTE — Progress Notes (Signed)
 Patient has requested that his wallet with 3 credit cards, $113.00 cash and 3-keys be locked in the hospital safe. Belongings counted by Charge, RN and this RN then placed in valuables envelope. Security notified by charge RN that valuables need to be locked up. At shift change, report given to day shift RN with belongings on the patients bedside table. Will ask for security to come and pick up valuables envelope.

## 2024-03-30 NOTE — Progress Notes (Signed)
 PHARMACY CONSULT NOTE - ELECTROLYTES  Pharmacy Consult for Electrolyte Monitoring and Replacement   Recent Labs: Potassium (mmol/L)  Date Value  03/30/2024 3.5  05/18/2014 3.6   Magnesium  (mg/dL)  Date Value  91/95/7974 1.9   Calcium  (mg/dL)  Date Value  91/95/7974 7.9 (L)   Calcium , Total (mg/dL)  Date Value  90/77/7984 8.3 (L)   Albumin (g/dL)  Date Value  92/78/7974 3.5  09/21/2013 3.2 (L)   Phosphorus (mg/dL)  Date Value  91/95/7974 3.6   Sodium (mmol/L)  Date Value  03/30/2024 139  05/18/2014 135 (L)   Corrected Ca: Unable to calculate, no recent albumin level.   Height: 5' 8 (172.7 cm) Weight: 50.1 kg (110 lb 7.2 oz) IBW/kg (Calculated) : 68.4 Estimated Creatinine Clearance: 26.4 mL/min (A) (by C-G formula based on SCr of 1.32 mg/dL (H)).  Assessment  George Hensley is a 89 y.o. male presenting with septic shock/pneumonia. PMH significant for COPD, bronchiectasis, NSTEMI, HFrEF. Pharmacy has been consulted to monitor and replace electrolytes.  Diet: Yes, heart healthy  MIVF: LR @ 100 mL/hr Pertinent medications: DuoNeb TID, insulin  aspart sliding scale q4h, ceftazidime  2g IV q24h, azithromycin  500 mg IV daily   Goal of Therapy: Electrolytes within normal limits  Plan:  Give Magnesium  sulfate 2 g IV x 1 Give Potassium chloride  10 mEq IV x 4   Thank you for allowing pharmacy to be a part of this patient's care.  Jandiel Magallanes [place], PharmD Candidate  03/30/2024 11:15 AM

## 2024-03-30 NOTE — ED Provider Notes (Addendum)
 [facility] Provider Note    Event Date/Time   First MD Initiated Contact with Patient 03/30/24 0126     (approximate)   History   Respiratory Distress and Shortness of Breath   HPI  George Hensley is a 89 y.o. male with history of COPD, asthma, bronchiectasis, CAD status post stent, diabetes who presents to the emergency department with complaints of shortness of breath, chest pain that started today.  Was hypoxic to the 80s with EMS on room air.  Does not wear oxygen  chronically.  Did have vomiting tonight and has had diarrhea for 1 week.  No abdominal pain.  No fever.  No lower extremity swelling or discomfort.  Lives at home alone.  Patient is a DNR/DNI.  Patient received DuoNeb, albuterol , Solu-Medrol  with EMS.  Patient initially was hypotensive with EMS.   History provided by patient, EMS.    Past Medical History:  Diagnosis Date   Asthma    Bronchiectasis (HCC)    COPD (chronic obstructive pulmonary disease) (HCC)    Diabetes mellitus     Past Surgical History:  Procedure Laterality Date   CHOLECYSTECTOMY  2000   CORONARY STENT PLACEMENT  1999    MEDICATIONS:  Prior to Admission medications   Medication Sig Start Date End Date Taking? Authorizing Provider  albuterol  (VENTOLIN  HFA) 108 (90 Base) MCG/ACT inhaler Inhale 1-2 puffs into the lungs every 6 (six) hours as needed for wheezing or shortness of breath. Patient not taking: Reported on 09/30/2023    [provider]  aspirin  EC 81 MG tablet Take 81 mg by mouth daily.     [provider]  camphor-menthol VIKKI) lotion Apply 1 Application topically every 6 (six) hours as needed for itching. 09/25/23   [provider]  ciclopirox (PENLAC) 8 % solution Apply 1 Application topically daily as needed (nail fungus). 10/08/22   [provider]  diltiazem  (CARDIZEM  CD) 120 MG 24 hr capsule Take 120 mg by mouth daily. 07/16/23   [provider]   finasteride (PROSCAR) 5 MG tablet Take 5 mg by mouth daily.    [provider]  fluticasone  (FLONASE) 50 MCG/ACT nasal spray Place 1 spray into both nostrils daily. 08/13/23   [provider]  Fluticasone -Umeclidin-Vilant 100-62.5-25 MCG/ACT AEPB Inhale 1 puff into the lungs daily.    [provider]  glipiZIDE (GLUCOTROL XL) 10 MG 24 hr tablet Take 10 mg by mouth daily.    [provider]  guaiFENesin -dextromethorphan (ROBITUSSIN DM) 100-10 MG/5ML syrup Take 5 mLs by mouth every 4 (four) hours as needed for cough (chest congestion). 10/01/23   Amin, Sumayya, MD  montelukast (SINGULAIR) 10 MG tablet Take 1 tablet by mouth daily.    [provider]  nystatin cream (MYCOSTATIN) Apply 1 Application topically 2 (two) times daily. 09/04/23   [provider]  ondansetron  (ZOFRAN -ODT) 4 MG disintegrating tablet Take 1 tablet (4 mg total) by mouth every 6 (six) hours as needed for nausea or vomiting. 03/17/24   Deonte Otting, Josette SAILOR, DO  tamsulosin  (FLOMAX ) 0.4 MG CAPS capsule Take 0.4 mg by mouth daily.    [provider]    Physical Exam   Triage Vital Signs: ED Triage Vitals  Encounter Vitals Group     BP 03/30/24 0137 (!) 109/59     Girls Systolic BP Percentile --      Girls Diastolic BP Percentile --      Boys Systolic BP Percentile --  Boys Diastolic BP Percentile --      Pulse Rate 03/30/24 0137 86     Resp 03/30/24 0137 (!) 22     Temp 03/30/24 0137 98 F (36.7 C)     Temp Source 03/30/24 0137 Oral     SpO2 03/30/24 0127 98 %     Weight 03/30/24 0139 110 lb 7.2 oz (50.1 kg)     Height 03/30/24 0139 5' 8 (1.727 m)     Head Circumference --      Peak Flow --      Pain Score 03/30/24 0139 0     Pain Loc --      Pain Education --      Exclude from Growth Chart --     Most recent vital signs: Vitals:   03/30/24 0353 03/30/24 0430  BP: (!) 87/50 (!) 93/43  Pulse: 81 74  Resp: (!) 21 (!) 21  Temp:    SpO2: 93% 92%     CONSTITUTIONAL: Alert, responds appropriately to questions.  Elderly HEAD: Normocephalic, atraumatic EYES: Conjunctivae clear, pupils appear equal, sclera nonicteric ENT: normal nose; moist mucous membranes NECK: Supple, normal ROM CARD: RRR; S1 and S2 appreciated RESP: Patient is mildly tachypneic.  Speaking full sentences.  Sats in the mid 90s on 6 L nasal cannula.  Rhonchorous breath sounds diffusely.  No wheezing, rales. ABD/GI: Non-distended; soft, non-tender, no rebound, no guarding, no peritoneal signs BACK: The back appears normal EXT: Normal ROM in all joints; no deformity noted, no edema, no calf tenderness or calf swelling SKIN: Normal color for age and race; warm; no rash on exposed skin NEURO: Moves all extremities equally, normal speech PSYCH: The patient's mood and manner are appropriate.   ED Results / Procedures / Treatments   LABS: (all labs ordered are listed, but only abnormal results are displayed) Labs Reviewed  CBC WITH DIFFERENTIAL/PLATELET - Abnormal; Notable for the following components:      Result Value   WBC 15.4 (*)    Neutro Abs 12.9 (*)    All other components within normal limits  BASIC METABOLIC PANEL WITH GFR - Abnormal; Notable for the following components:   Potassium 3.1 (*)    Glucose, Bld 243 (*)    BUN 32 (*)    Creatinine, Ser 1.61 (*)    Calcium  8.4 (*)    GFR, Estimated 40 (*)    All other components within normal limits  BRAIN NATRIURETIC PEPTIDE - Abnormal; Notable for the following components:   B Natriuretic Peptide 171.2 (*)    All other components within normal limits  TROPONIN I (HIGH SENSITIVITY) - Abnormal; Notable for the following components:   Troponin I (High Sensitivity) 25 (*)    All other components within normal limits  RESP PANEL BY RT-PCR (RSV, FLU A&B, COVID)  RVPGX2  CULTURE, BLOOD (ROUTINE X 2)  CULTURE, BLOOD (ROUTINE X 2)  LACTIC ACID, PLASMA  PROCALCITONIN  URINALYSIS, W/ REFLEX TO CULTURE  (INFECTION SUSPECTED)  TROPONIN I (HIGH SENSITIVITY)     EKG:  EKG Interpretation Date/Time:  Monday March 30 2024 01:29:45 EDT Ventricular Rate:  86 PR Interval:  132 QRS Duration:  150 QT Interval:  401 QTC Calculation: 480 R Axis:   -81  Text Interpretation: Ectopic atrial rhythm Ventricular premature complex Nonspecific IVCD with LAD Left ventricular hypertrophy Anterior infarct, old Confirmed by Neomi Neptune [phone]) on 03/30/2024 1:38:15 AM         RADIOLOGY: My personal review and interpretation of  imaging: Chest x-ray shows pneumonia.  I have personally reviewed all radiology reports.   DG Chest Portable 1 View Result Date: 03/30/2024 CLINICAL DATA:  Shortness of breath EXAM: PORTABLE CHEST 1 VIEW COMPARISON:  09/29/2023, 08/08/2018, 04/25/2023 FINDINGS: Patchy interstitial opacities in the mid to lower lungs, slightly increased compared to 09/29/2023 and suggestive of acute infectious or inflammatory process, possibly atypical organism. Normal cardiac size. No pneumothorax IMPRESSION: Patchy interstitial opacities in the mid to lower lungs, slightly increased compared to 09/29/2023 and suggestive of acute infectious or inflammatory process, possibly atypical organism. Electronically Signed   By: Luke Bun M.D.   On: 03/30/2024 02:11     PROCEDURES:  Critical Care performed: Yes, see critical care procedure note(s)   CRITICAL CARE Performed by: Josette Tuyen Uncapher   Total critical care time: 30 minutes  Critical care time was exclusive of separately billable procedures and treating other patients.  Critical care was necessary to treat or prevent imminent or life-threatening deterioration.  Critical care was time spent personally by me on the following activities: development of treatment plan with patient and/or surrogate as well as nursing, discussions with consultants, evaluation of patient's response to treatment, examination of patient, obtaining history from  patient or surrogate, ordering and performing treatments and interventions, ordering and review of laboratory studies, ordering and review of radiographic studies, pulse oximetry and re-evaluation of patient's condition.   SABRA1-3 Lead EKG Interpretation  Performed by: Laronn Devonshire, Josette SAILOR, DO Authorized by: Pauletta Pickney, Josette SAILOR, DO     Interpretation: normal     ECG rate:  86   ECG rate assessment: normal     Rhythm: sinus rhythm     Ectopy: none     Conduction: normal       IMPRESSION / MDM / ASSESSMENT AND PLAN / ED COURSE  I reviewed the triage vital signs and the nursing notes.    Patient here with shortness of breath, hypoxia.  The patient is on the cardiac monitor to evaluate for evidence of arrhythmia and/or significant heart rate changes.   DIFFERENTIAL DIAGNOSIS (includes but not limited to):   Pneumonia, viral URI, COPD exacerbation, CHF exacerbation, pneumothorax, PE, ACS   Patient's presentation is most consistent with acute presentation with potential threat to life or bodily function.   PLAN: Will obtain labs, chest x-ray, COVID and flu swab.  Will give breathing treatments in the ED.  Patient also reports vomiting, diarrhea.  Abdominal exam benign.  Was seen in the emergency department 2 weeks ago for the same and had negative CT of the abdomen pelvis.   MEDICATIONS GIVEN IN ED: Medications  lactated ringers  infusion ( Intravenous New Bag/Given 03/30/24 0418)  lactated ringers  bolus 1,000 mL (1,000 mLs Intravenous New Bag/Given 03/30/24 0416)  norepinephrine  (LEVOPHED ) 4mg  in (0.016 mg/mL) premix infusion (2 mcg/min Intravenous New Bag/Given 03/30/24 0440)  ipratropium-albuterol  (DUONEB) 0.5-2.5 (3) MG/3ML nebulizer solution 3 mL (3 mLs Nebulization Given 03/30/24 0151)  ondansetron  (ZOFRAN ) injection 4 mg (4 mg Intravenous Given 03/30/24 0156)  sodium chloride  0.9 % bolus 1,000 mL (0 mLs Intravenous Stopped 03/30/24 0351)  cefTRIAXone  (ROCEPHIN ) 2 g in sodium chloride  0.9 %  100 mL IVPB (0 g Intravenous Stopped 03/30/24 0322)  azithromycin  (ZITHROMAX ) 500 mg in sodium chloride  0.9 % 250 mL IVPB (0 mg Intravenous Stopped 03/30/24 0349)  midodrine  (PROAMATINE ) tablet 10 mg (10 mg Oral Given 03/30/24 0411)     ED COURSE: Patient's labs show leukocytosis of 15,000 with left shift.  Elevated creatinine today  1.6.  Getting IV fluids.  Troponin elevated at 25 which appears to be around his baseline.  COVID, flu and RSV negative.  Chest x-ray reviewed and interpreted by myself and the radiologist and shows multifocal pneumonia.  Patient getting antibiotics for atypical pneumonia.  Initially reached out to the hospitalist for admission however patient now again hypotensive despite IV fluids.  Will continue IV hydration and start vasopressors.  Patient confirms he is a DNR/DNI but would like treatment otherwise including IV fluids, antibiotics, medical admission, central line if needed, vasopressors.  Will discuss with ICU.   CONSULTS: Discussed with Almarie Nose, NP with ICU who will see patient in the ED for admission.   OUTSIDE RECORDS REVIEWED: Reviewed last admission in February 2024.  Patient admitted at that time for aspiration pneumonia.       FINAL CLINICAL IMPRESSION(S) / ED DIAGNOSES   Final diagnoses:  Acute respiratory failure with hypoxia (HCC)  Septic shock (HCC)  Community acquired pneumonia, unspecified laterality     Rx / DC Orders   ED Discharge Orders     None        Note:  This document was prepared using Dragon voice recognition software and may include unintentional dictation errors.   Daysean Tinkham, Josette SAILOR, DO 03/30/24 0424    Aniken Monestime, Josette SAILOR, DO 03/30/24 [phone]

## 2024-03-30 NOTE — Progress Notes (Signed)
 CODE SEPSIS - PHARMACY COMMUNICATION  **Broad Spectrum Antibiotics should be administered within 1 hour of Sepsis diagnosis**  Time Code Sepsis Called/Page Received:  8/4 @ 0348  Antibiotics Ordered: Azithromycin  , Ceftriaxone    Time of 1st antibiotic administration: Ceftriaxone  2 gm IV X 1 on 8/4 @ 0243   Additional action taken by pharmacy:   If necessary, Name of Provider/Nurse Contacted:     Nasia Cannan D ,PharmD Clinical Pharmacist  03/30/2024  4:05 AM

## 2024-03-31 ENCOUNTER — Other Ambulatory Visit: Payer: Self-pay

## 2024-03-31 DIAGNOSIS — E43 Unspecified severe protein-calorie malnutrition: Secondary | ICD-10-CM | POA: Diagnosis not present

## 2024-03-31 DIAGNOSIS — J189 Pneumonia, unspecified organism: Secondary | ICD-10-CM | POA: Diagnosis not present

## 2024-03-31 DIAGNOSIS — J471 Bronchiectasis with (acute) exacerbation: Secondary | ICD-10-CM

## 2024-03-31 DIAGNOSIS — R6521 Severe sepsis with septic shock: Secondary | ICD-10-CM

## 2024-03-31 DIAGNOSIS — A419 Sepsis, unspecified organism: Secondary | ICD-10-CM | POA: Diagnosis not present

## 2024-03-31 LAB — BASIC METABOLIC PANEL WITH GFR
Anion gap: 7 (ref 5–15)
BUN: 25 mg/dL — ABNORMAL HIGH (ref 8–23)
CO2: 25 mmol/L (ref 22–32)
Calcium: 8.2 mg/dL — ABNORMAL LOW (ref 8.9–10.3)
Chloride: 107 mmol/L (ref 98–111)
Creatinine, Ser: 1.03 mg/dL (ref 0.61–1.24)
GFR, Estimated: >60 mL/min (ref >60)
Glucose, Bld: 164 mg/dL — ABNORMAL HIGH (ref 70–99)
Potassium: 4.5 mmol/L (ref 3.5–5.1)
Sodium: 139 mmol/L (ref 135–145)

## 2024-03-31 LAB — GLUCOSE, CAPILLARY
Glucose-Capillary: 153 mg/dL — ABNORMAL HIGH (ref 70–99)
Glucose-Capillary: 143 mg/dL — ABNORMAL HIGH (ref 70–99)
Glucose-Capillary: 195 mg/dL — ABNORMAL HIGH (ref 70–99)

## 2024-03-31 LAB — ECHOCARDIOGRAM COMPLETE
Height: 68 in
S' Lateral: 5 cm
Weight: 1767.21 [oz_av]

## 2024-03-31 LAB — MAGNESIUM: Magnesium: 2.5 mg/dL — ABNORMAL HIGH (ref 1.7–2.4)

## 2024-03-31 LAB — PHOSPHORUS: Phosphorus: 2.9 mg/dL (ref 2.5–4.6)

## 2024-03-31 MED ORDER — AZITHROMYCIN 500 MG PO TABS
500.0000 mg | ORAL_TABLET | Freq: Every day | ORAL | 0 refills | Status: AC
  Filled 2024-03-31: qty 3, 3d supply, fill #0

## 2024-03-31 MED ORDER — AZITHROMYCIN 500 MG PO TABS: 500.0000 mg | ORAL_TABLET | Freq: Every day | ORAL | 0 refills | Status: DC

## 2024-03-31 MED ORDER — PREDNISONE 10 MG PO TABS
ORAL_TABLET | ORAL | 0 refills | Status: AC
  Filled 2024-03-31: qty 30, 12d supply, fill #0

## 2024-03-31 MED ORDER — PREDNISONE 10 MG PO TABS: ORAL_TABLET | ORAL | 0 refills | Status: DC

## 2024-03-31 MED ORDER — ADULT MULTIVITAMIN W/MINERALS CH: 1.0000 | ORAL_TABLET | Freq: Every day | ORAL | 0 refills | Status: DC

## 2024-03-31 MED ORDER — AMOXICILLIN-POT CLAVULANATE 875-125 MG PO TABS
1.0000 | ORAL_TABLET | Freq: Two times a day (BID) | ORAL | 0 refills | Status: DC
  Filled 2024-03-31: qty 10, 5d supply, fill #0

## 2024-03-31 MED ORDER — SODIUM CHLORIDE 0.9 % IV SOLN
2.0000 g | Freq: Two times a day (BID) | INTRAVENOUS | Status: DC
  Filled 2024-03-31: qty 2

## 2024-03-31 MED ORDER — AMOXICILLIN-POT CLAVULANATE 875-125 MG PO TABS: 1.0000 | ORAL_TABLET | Freq: Two times a day (BID) | ORAL | 0 refills | Status: DC

## 2024-03-31 NOTE — Plan of Care (Signed)
 IV's removed, discharge instructions reviewed, Rx's delivered to room, and patient discharged to home via [facility]

## 2024-03-31 NOTE — Progress Notes (Signed)
 03/31/2024  [name] DOB: Feb 23, 1931 MRN: [id number]   RIDER WAIVER AND RELEASE OF LIABILITY  For the purposes of helping with transportation needs, [facility] partners with outside transportation providers (taxi companies, [place], Catering manager.) to give [facility] patients or other approved people the choice of on-demand rides Public librarian) to our buildings for non-emergency visits.  By using [employer], I, the person signing this document, on behalf of myself and/or any legal minors (in my care using the [employer]), agree:  Science writer given to me are supplied by independent, outside transportation providers who do not work for, or have any affiliation with, [employer]. [place] is not a transportation company. [place] has no control over the quality or safety of the rides I get using [employer]. [place] has no control over whether any outside ride will happen on time or not. [place] gives no guarantee on the reliability, quality, safety, or availability on any rides, or that no mistakes will happen. I know and accept that traveling by vehicle (car, truck, SVU, fleeta, bus, taxi, etc.) has risks of serious injuries such as disability, being paralyzed, and death. I know and agree the risk of using [employer] is mine alone, and not [employer]. [employer] are provided as is and as are available. The transportation providers are in charge for all inspections and care of the vehicles used to provide these rides. I agree not to take legal action against [place], its agents, employees, officers, directors, representatives, insurers, attorneys, assigns, successors, subsidiaries, and affiliates at any time for any reasons related directly or indirectly to using [employer]. I also agree not to take legal action against [place] or its affiliates for any injury, death, or damage to property caused by or related to using  [employer]. I have read this Waiver and Release of Liability, and I understand the terms used in it and their legal meaning. This Waiver is freely and voluntarily given with the understanding that my right (or any legal minors) to legal action against [place] relating to [employer] is knowingly given up to use these services.   I attest that I read the Ride Waiver and Release of Liability to [name], gave Mr. Ly the opportunity to ask questions and answered the questions asked (if any). I affirm that [name] then provided consent for assistance with transportation.       Waiver Signed by patient placed in chart

## 2024-03-31 NOTE — Progress Notes (Signed)
 PHARMACY CONSULT NOTE - ELECTROLYTES  Pharmacy Consult for Electrolyte Monitoring and Replacement   Recent Labs: Potassium (mmol/L)  Date Value  03/31/2024 4.5  05/18/2014 3.6   Magnesium  (mg/dL)  Date Value  91/94/7974 2.5 (H)   Calcium  (mg/dL)  Date Value  91/94/7974 8.2 (L)   Calcium , Total (mg/dL)  Date Value  90/77/7984 8.3 (L)   Albumin (g/dL)  Date Value  92/78/7974 3.5  09/21/2013 3.2 (L)   Phosphorus (mg/dL)  Date Value  91/94/7974 2.9   Sodium (mmol/L)  Date Value  03/31/2024 139  05/18/2014 135 (L)   Corrected Ca: Unable to calculate, no recent albumin level.   Height: 5' 8 (172.7 cm) Weight: 50.1 kg (110 lb 7.2 oz) IBW/kg (Calculated) : 68.4 Estimated Creatinine Clearance: 33.8 mL/min (by C-G formula based on SCr of 1.03 mg/dL).  Assessment  George Hensley is a 89 y.o. male presenting with septic shock/pneumonia. PMH significant for COPD, bronchiectasis, NSTEMI, HFrEF. Pharmacy has been consulted to monitor and replace electrolytes.  Diet: Yes, carb modified MIVF: none Pertinent medications: DuoNeb TID, insulin  aspart sliding scale q4h, ceftazidime  2g IV q24h, azithromycin  500 mg IV daily   Goal of Therapy: Electrolytes within normal limits  Plan:  No electrolyte replacement at this time Patient has transferred from CCU- pharmacy will sign off CCM electrolyte consult. May reconsult if desired  Thank you for allowing pharmacy to be a part of this patient's care.  Allean Haas PharmD Clinical Pharmacist 03/31/2024

## 2024-03-31 NOTE — Care Management Important Message (Signed)
 Important Message  Patient Details  Name: George Hensley MRN: [id number] Date of Birth: [birth year]   Important Message Given:  Yes - Medicare IM     Rojelio [name] 03/31/2024, 12:03 PM

## 2024-03-31 NOTE — Inpatient Diabetes Management (Signed)
 Inpatient Diabetes Program Recommendations  AACE/ADA: New Consensus Statement on Inpatient Glycemic Control   Target Ranges:  Prepandial:   less than 140 mg/dL      Peak postprandial:   less than 180 mg/dL (1-2 hours)      Critically ill patients:  140 - 180 mg/dL    Latest Reference Range & Units 03/30/24 07:35 03/30/24 11:55 03/30/24 15:15 03/30/24 17:30 03/30/24 19:58 03/30/24 23:27 03/31/24 03:57  Glucose-Capillary 70 - 99 mg/dL 789 (H) 803 (H) 749 (H) 269 (H) 192 (H) 94 153 (H)   Review of Glycemic Control  Diabetes history: DM2 Outpatient Diabetes medications: Glipizide XL 10 mg daily Current orders for Inpatient glycemic control: Novolog  0-20 units Q4H; Solumedrol 40 mg Q24H  Inpatient Diabetes Program Recommendations:    Insulin : If steroids are continued, please consider ordering Novolog  3 units TID with meals for meal coverage if patient eats at least 50% of meals.  Thanks, Earnie Gainer, RN, MSN, CDCES Diabetes Coordinator Inpatient Diabetes Program [phone] (Team Pager from 8am to 5pm)

## 2024-03-31 NOTE — Discharge Summary (Signed)
 Physician Discharge Summary   Patient: George Hensley MRN: [id number] DOB: 05/22/1931  Admit date:     03/30/2024  Discharge date: 03/31/24  Discharge Physician: George Hensley   PCP: George Oneil JULIANNA, MD   Recommendations at discharge:    Pt to be discharged home.   If you experience worsening fever, chills, chest pain, shortness of breath, or other concerning symptoms, please call your PCP or go to the emergency department immediately.  Discharge Diagnoses: Principal Problem:   Sepsis due to pneumonia [facility]) Active Problems:   Septic shock (HCC)   Protein-calorie malnutrition, severe  Resolved Problems:   * No resolved hospital problems. [facility] Behavioral Course:  COPD on as needed oxygen , bronchiectasis, type 2 diabetes, HFrEF, EF 30 to 35%, CAD with prior stenting, who was hospitalized in February with aspiration pneumonia, presents by EMS with shortness of breath, nausea and vomiting, requiring 6 L O2 via nasal cannula for transport due to O2 sats in the 80s.  Assessment and Plan:  Concern for septic shock - Initially hypotensive with dyspnea, tachypnea, tachycardia, initially requiring vasopressor support.  However was quickly weaned off of vasopressors.  Blood pressure well-controlled.  Blood cultures, empiric IV fluid bolus and antibiotics given.  Sepsis appears resolved.  Acute hypoxic respiratory failure - Initially requiring up to 6 L nasal cannula.  Throughout the course hospital stay was able to be weaned down to room air.  Patient ambulatory, eager for discharge home.  Acute exacerbation of chronic bronchiectasis - Followed by pulmonary at [facility].  Worsening cough.  Was placed on broad-spectrum antibiotics empirically.  Appears to be back at baseline.  Will transition patient to p.o. Augmentin  plus azithromycin  to take as directed.  Will also give prescription for prednisone  to take as directed upon discharge.  Resume home Trelegy and inhaler/nebulizer regimen.  Follow-up  with outpatient pulmonology in 1 to 2 weeks.  Acute kidney injury - Marked improvement after IV fluid hydration.  Resolved.  Severe protein calorie malnutrition - Encourage p.o. protein calorie supplement.  Uncontrolled diabetes mellitus -A1c 9.6 suggesting poor control.  Resume home regimen.  Would recommend patient follow closely with primary care physician to titrate diabetic regiment.   Consultants: PCCM Procedures performed: None Disposition: Home Diet recommendation:  Discharge Diet Orders (From admission, onward)     Start     Ordered   03/31/24 0000  Diet - low sodium heart healthy        03/31/24 1244           Carb modified diet  DISCHARGE MEDICATION: Allergies as of 03/31/2024       Reactions   Metoprolol    Pt states syncope with lopressor        Medication List     TAKE these medications    albuterol  108 (90 Base) MCG/ACT inhaler Commonly known as: VENTOLIN  HFA Inhale 1-2 puffs into the lungs every 6 (six) hours as needed for wheezing or shortness of breath.   amoxicillin -clavulanate 875-125 MG tablet Commonly known as: AUGMENTIN  Take 1 tablet by mouth 2 (two) times daily.   aspirin  EC 81 MG tablet Take 81 mg by mouth daily.   azithromycin  500 MG tablet Commonly known as: Zithromax  Take 1 tablet (500 mg total) by mouth daily for 3 days.   camphor-menthol lotion Commonly known as: SARNA Apply 1 Application topically every 6 (six) hours as needed for itching.   ciclopirox 8 % solution Commonly known as: PENLAC Apply 1 Application topically daily  as needed (nail fungus).   diltiazem  120 MG 24 hr capsule Commonly known as: CARDIZEM  CD Take 120 mg by mouth daily.   finasteride 5 MG tablet Commonly known as: PROSCAR Take 5 mg by mouth daily.   fluticasone  50 MCG/ACT nasal spray Commonly known as: FLONASE Place 1 spray into both nostrils daily.   Fluticasone -Umeclidin-Vilant 100-62.5-25 MCG/ACT Aepb Inhale 1 puff into the lungs  daily.   glipiZIDE 10 MG 24 hr tablet Commonly known as: GLUCOTROL XL Take 10 mg by mouth daily.   guaiFENesin -dextromethorphan 100-10 MG/5ML syrup Commonly known as: ROBITUSSIN DM Take 5 mLs by mouth every 4 (four) hours as needed for cough (chest congestion).   montelukast 10 MG tablet Commonly known as: SINGULAIR Take 1 tablet by mouth daily.   multivitamin with minerals Tabs tablet Take 1 tablet by mouth daily. Start taking on: April 01, 2024   nystatin cream Commonly known as: MYCOSTATIN Apply 1 Application topically 2 (two) times daily.   ondansetron  4 MG disintegrating tablet Commonly known as: ZOFRAN -ODT Take 1 tablet (4 mg total) by mouth every 6 (six) hours as needed for nausea or vomiting.   predniSONE  10 MG tablet Commonly known as: DELTASONE  Take 4 tablets (40 mg total) by mouth daily for 3 days, THEN 3 tablets (30 mg total) daily for 3 days, THEN 2 tablets (20 mg total) daily for 3 days, THEN 1 tablet (10 mg total) daily for 3 days. Start taking on: March 31, 2024   tamsulosin  0.4 MG Caps capsule Commonly known as: FLOMAX  Take 0.4 mg by mouth daily.         Discharge Exam: Filed Weights   03/30/24 0139  Weight: 50.1 kg    GENERAL:  Alert, pleasant, no acute distress, frail HEENT:  EOMI CARDIOVASCULAR:  RRR, no murmurs appreciated RESPIRATORY: Normal air movement bilaterally, coarse cough GASTROINTESTINAL:  Soft, nontender, nondistended EXTREMITIES:  No LE edema bilaterally NEURO:  No new focal deficits appreciated SKIN:  No rashes noted PSYCH:  Appropriate mood and affect     Condition at discharge: good  The results of significant diagnostics from this hospitalization (including imaging, microbiology, ancillary and laboratory) are listed below for reference.   Imaging Studies: ECHOCARDIOGRAM COMPLETE Result Date: 03/31/2024    ECHOCARDIOGRAM REPORT   Patient Name:   George Hensley Date of Exam: 03/30/2024 Medical Rec #:  [id number]        Height:       68.0 in Accession #:    [id number]      Weight:       110.4 lb Date of Birth:  12/05/1930      BSA:          1.588 m Patient Age:    89 years        BP:           112/61 mmHg Patient Gender: M               HR:           70 bpm. Exam Location:  [facility] Procedure: 2D Echo, Cardiac Doppler and Color Doppler (Both Spectral and Color            Flow Doppler were utilized during procedure). STAT ECHO Indications:     Dyspnea R06.00                  Congestive heart failure I50.9  History:         Patient has prior history of  Echocardiogram examinations, most                  recent 08/01/2020. COPD; Risk Factors:Diabetes.  Sonographer:     Christopher Furnace Referring Phys:  [id number] KHABIB DGAYLI Diagnosing Phys: Lonni End MD  Sonographer Comments: Technically challenging study due to limited acoustic windows and no apical window. Image acquisition challenging due to COPD. IMPRESSIONS  1. Left ventricular ejection fraction, by estimation, is 30 to 35%. The left ventricle has moderately decreased function. Left ventricular endocardial border not optimally defined to evaluate regional wall motion. The left ventricular internal cavity size was mildly dilated. Left ventricular diastolic function could not be evaluated.  2. Right ventricular systolic function is normal. The right ventricular size is normal.  3. The mitral valve is abnormal. Trivial mitral valve regurgitation. Mitral valve gradient could not be assessed mitral stenosis.  4. The aortic valve is tricuspid. There is mild calcification of the aortic valve. There is mild thickening of the aortic valve. Aortic valve regurgitation is not visualized. Aortic valve gradient could not be assessed.  5. The inferior vena cava is normal in size with greater than 50% respiratory variability, suggesting right atrial pressure of 3 mmHg. FINDINGS  Left Ventricle: Left ventricular ejection fraction, by estimation, is 30 to 35%. The left ventricle has moderately  decreased function. Left ventricular endocardial border not optimally defined to evaluate regional wall motion. The left ventricular internal cavity size was mildly dilated. There is borderline left ventricular hypertrophy. Left ventricular diastolic function could not be evaluated. Right Ventricle: The right ventricular size is normal. No increase in right ventricular wall thickness. Right ventricular systolic function is normal. Left Atrium: Left atrial size was not well visualized. Right Atrium: Right atrial size was not well visualized. Pericardium: There is no evidence of pericardial effusion. Mitral Valve: The mitral valve is abnormal. There is mild thickening of the mitral valve leaflet(s). Trivial mitral valve regurgitation. Mitral valve gradient could not be assessed mitral valve stenosis. Tricuspid Valve: The tricuspid valve is normal in structure. Tricuspid valve regurgitation is trivial. Aortic Valve: The aortic valve is tricuspid. There is mild calcification of the aortic valve. There is mild thickening of the aortic valve. Aortic valve regurgitation is not visualized. Aortic valve gradient could not be assessed. Pulmonic Valve: The pulmonic valve was not well visualized. Pulmonic valve regurgitation is not visualized. No evidence of pulmonic stenosis. Aorta: The aortic root is normal in size and structure. Pulmonary Artery: The pulmonary artery is not well seen. Venous: The inferior vena cava is normal in size with greater than 50% respiratory variability, suggesting right atrial pressure of 3 mmHg. IAS/Shunts: The interatrial septum was not well visualized.  LEFT VENTRICLE PLAX 2D LVIDd:         5.90 cm LVIDs:         5.00 cm LV PW:         1.00 cm LV IVS:        1.00 cm LVOT diam:     2.00 cm LVOT Area:     3.14 cm  LEFT ATRIUM         Index LA diam:    2.80 cm 1.76 cm/m   AORTA Ao Root diam: 3.20 cm  SHUNTS Systemic Diam: 2.00 cm Lonni Hanson MD Electronically signed by Lonni Hanson MD  Signature Date/Time: 03/31/2024/6:53:03 AM    Final    DG Chest Portable 1 View Result Date: 03/30/2024 CLINICAL DATA:  Shortness of breath EXAM: PORTABLE CHEST 1  VIEW COMPARISON:  09/29/2023, 08/08/2018, 04/25/2023 FINDINGS: Patchy interstitial opacities in the mid to lower lungs, slightly increased compared to 09/29/2023 and suggestive of acute infectious or inflammatory process, possibly atypical organism. Normal cardiac size. No pneumothorax IMPRESSION: Patchy interstitial opacities in the mid to lower lungs, slightly increased compared to 09/29/2023 and suggestive of acute infectious or inflammatory process, possibly atypical organism. Electronically Signed   By: Luke Bun M.D.   On: 03/30/2024 02:11   CT ABDOMEN PELVIS W CONTRAST Result Date: 03/17/2024 EXAM: CT ABDOMEN AND PELVIS WITH CONTRAST 03/17/2024 03:03:52 AM TECHNIQUE: CT of the abdomen and pelvis was performed with the administration of intravenous contrast. Multiplanar reformatted images are provided for review. Automated exposure control, iterative reconstruction, and/or weight based adjustment of the mA/kV was utilized to reduce the radiation dose to as low as reasonably achievable. COMPARISON: 08/08/2018 CLINICAL HISTORY: Abdominal pain, acute, nonlocalized. Pt complaints of emesis that started today with associated abdominal pain. Pt states he has had several episodes of emesis today. Denies hematemesis. FINDINGS: LOWER CHEST: Subpleural scarring with bronchiectasis in the bilateral lower lobes, postinfectious/inflammatory. LIVER: Two right hepatic cysts measuring up to 14 mm, benign. Status post cholecystectomy. GALLBLADDER AND BILE DUCTS: Status post cholecystectomy. Mild pneumobilia. SPLEEN: No acute abnormality. PANCREAS: No acute abnormality. ADRENAL GLANDS: No acute abnormality. KIDNEYS, URETERS AND BLADDER: Bilateral simple lower pole renal cysts measuring up to 2.8 cm on the right (image 87), benign (Bosniak 1). No follow up is  recommended. No stones in the kidneys or ureters. No hydronephrosis. No perinephric or periureteral stranding. Moderately distended bladder. GI AND BOWEL: Tiny hiatal hernia. Appendix is not discretely visualized. No bowel obstruction. No bowel wall thickening. PERITONEUM AND RETROPERITONEUM: No ascites. No free air. VASCULATURE: Atherosclerotic calcifications of the abdominal aorta and branch vessels, although patent. LYMPH NODES: No lymphadenopathy. REPRODUCTIVE ORGANS: Prostatomegaly, with enlargement of the central gland indenting the base of the bladder, suggesting BPH. BONES AND SOFT TISSUES: Degenerative changes of the lumbar spine. No acute osseous abnormality. No focal soft tissue abnormality. IMPRESSION: 1. No acute findings in the abdomen or pelvis. 2. Stable ancillary findings, as above. Electronically signed by: Pinkie Pebbles MD 03/17/2024 03:14 AM EDT RP Workstation: [id number]    Microbiology: Results for orders placed or performed during the hospital encounter of 03/30/24  Resp panel by RT-PCR (RSV, Flu A&B, Covid) Anterior Nasal Swab     Status: None   Collection Time: 03/30/24  1:50 AM   Specimen: Anterior Nasal Swab  Result Value Ref Range Status   SARS Coronavirus 2 by RT PCR NEGATIVE NEGATIVE Final    Comment: (NOTE) SARS-CoV-2 target nucleic acids are NOT DETECTED.  The SARS-CoV-2 RNA is generally detectable in upper respiratory specimens during the acute phase of infection. The lowest concentration of SARS-CoV-2 viral copies this assay can detect is 138 copies/mL. A negative result does not preclude SARS-Cov-2 infection and should not be used as the sole basis for treatment or other patient management decisions. A negative result may occur with  improper specimen collection/handling, submission of specimen other than nasopharyngeal swab, presence of viral mutation(s) within the areas targeted by this assay, and inadequate number of viral copies(<138 copies/mL). A  negative result must be combined with clinical observations, patient history, and epidemiological information. The expected result is Negative.  Fact Sheet for Patients:  BloggerCourse.com  Fact Sheet for Healthcare Providers:  SeriousBroker.it  This test is no t yet approved or cleared by the [place]  FDA and  has been authorized for  detection and/or diagnosis of SARS-CoV-2 by FDA under an Emergency Use Authorization (EUA). This EUA will remain  in effect (meaning this test can be used) for the duration of the COVID-19 declaration under Section 564(b)(1) of the Act, 21 U.S.C.section 360bbb-3(b)(1), unless the authorization is terminated  or revoked sooner.       Influenza A by PCR NEGATIVE NEGATIVE Final   Influenza B by PCR NEGATIVE NEGATIVE Final    Comment: (NOTE) The Xpert Xpress SARS-CoV-2/FLU/RSV plus assay is intended as an aid in the diagnosis of influenza from Nasopharyngeal swab specimens and should not be used as a sole basis for treatment. Nasal washings and aspirates are unacceptable for Xpert Xpress SARS-CoV-2/FLU/RSV testing.  Fact Sheet for Patients: BloggerCourse.com  Fact Sheet for Healthcare Providers: SeriousBroker.it  This test is not yet approved or cleared by the [place]  FDA and has been authorized for detection and/or diagnosis of SARS-CoV-2 by FDA under an Emergency Use Authorization (EUA). This EUA will remain in effect (meaning this test can be used) for the duration of the COVID-19 declaration under Section 564(b)(1) of the Act, 21 U.S.C. section 360bbb-3(b)(1), unless the authorization is terminated or revoked.     Resp Syncytial Virus by PCR NEGATIVE NEGATIVE Final    Comment: (NOTE) Fact Sheet for Patients: BloggerCourse.com  Fact Sheet for Healthcare  Providers: SeriousBroker.it  This test is not yet approved or cleared by the [place]  FDA and has been authorized for detection and/or diagnosis of SARS-CoV-2 by FDA under an Emergency Use Authorization (EUA). This EUA will remain in effect (meaning this test can be used) for the duration of the COVID-19 declaration under Section 564(b)(1) of the Act, 21 U.S.C. section 360bbb-3(b)(1), unless the authorization is terminated or revoked.  Performed at [facility], [address] [place], [place] [address]   Blood culture (routine x 2)     Status: None (Preliminary result)   Collection Time: 03/30/24  2:37 AM   Specimen: BLOOD  Result Value Ref Range Status   Specimen Description BLOOD BLOOD RIGHT ARM  Final   Special Requests   Final    BOTTLES DRAWN AEROBIC AND ANAEROBIC Blood Culture results may not be optimal due to an inadequate volume of blood received in culture bottles   Culture   Final    NO GROWTH 1 DAY Performed at [facility], [address], [place], [place] [address]    Report Status PENDING  Incomplete  Blood culture (routine x 2)     Status: None (Preliminary result)   Collection Time: 03/30/24  2:37 AM   Specimen: BLOOD  Result Value Ref Range Status   Specimen Description BLOOD BLOOD LEFT ARM  Final   Special Requests   Final    BOTTLES DRAWN AEROBIC AND ANAEROBIC Blood Culture adequate volume   Culture   Final    NO GROWTH 1 DAY Performed at [facility], [address], [place], [place] [address]    Report Status PENDING  Incomplete  MRSA Next Gen by PCR, Nasal     Status: None   Collection Time: 03/30/24  5:49 AM   Specimen: Nasal Mucosa; Nasal Swab  Result Value Ref Range Status   MRSA by PCR Next Gen NOT DETECTED NOT DETECTED Final    Comment: (NOTE) The GeneXpert MRSA Assay (FDA approved for NASAL specimens only), is one component of a comprehensive MRSA colonization surveillance program.  It is not intended to diagnose MRSA infection nor to guide or monitor  treatment for MRSA infections. Test performance is not FDA approved in patients less than 34 years old. Performed at [facility], [address] [place], [place] [address]   Respiratory (~20 pathogens) panel by PCR     Status: None   Collection Time: 03/30/24  8:10 AM   Specimen: Nasopharyngeal Swab; Respiratory  Result Value Ref Range Status   Adenovirus NOT DETECTED NOT DETECTED Final   Coronavirus 229E NOT DETECTED NOT DETECTED Final    Comment: (NOTE) The Coronavirus on the Respiratory Panel, DOES NOT test for the novel  Coronavirus (2019 nCoV)    Coronavirus HKU1 NOT DETECTED NOT DETECTED Final   Coronavirus NL63 NOT DETECTED NOT DETECTED Final   Coronavirus OC43 NOT DETECTED NOT DETECTED Final   Metapneumovirus NOT DETECTED NOT DETECTED Final   Rhinovirus / Enterovirus NOT DETECTED NOT DETECTED Final   Influenza A NOT DETECTED NOT DETECTED Final   Influenza B NOT DETECTED NOT DETECTED Final   Parainfluenza Virus 1 NOT DETECTED NOT DETECTED Final   Parainfluenza Virus 2 NOT DETECTED NOT DETECTED Final   Parainfluenza Virus 3 NOT DETECTED NOT DETECTED Final   Parainfluenza Virus 4 NOT DETECTED NOT DETECTED Final   Respiratory Syncytial Virus NOT DETECTED NOT DETECTED Final   Bordetella pertussis NOT DETECTED NOT DETECTED Final   Bordetella Parapertussis NOT DETECTED NOT DETECTED Final   Chlamydophila pneumoniae NOT DETECTED NOT DETECTED Final   Mycoplasma pneumoniae NOT DETECTED NOT DETECTED Final    Comment: Performed at [facility], [address] [place], [place] [address]    Labs: CBC: Recent Labs  Lab 03/30/24 0150 03/30/24 0810  WBC 15.4* 17.2*  NEUTROABS 12.9*  --   HGB 13.3 13.0  HCT 40.4 39.6  MCV 93.5 94.5  PLT 242 231   Basic Metabolic Panel: Recent Labs  Lab 03/30/24 0150 03/30/24 0810 03/30/24 1752 03/31/24 0439  NA 139 139 138 139  K 3.1* 3.5 4.2 4.5  CL  101 104 103 107  CO2 23 22 26 25   GLUCOSE 243* 231* 229* 164*  BUN 32* 28* 25* 25*  CREATININE 1.61* 1.32* 1.21 1.03  CALCIUM  8.4* 7.9* 8.1* 8.2*  MG  --  1.9  --  2.5*  PHOS  --  3.6  --  2.9   Liver Function Tests: No results for input(s): AST, ALT, ALKPHOS, BILITOT, PROT, ALBUMIN in the last 168 hours. CBG: Recent Labs  Lab 03/30/24 1958 03/30/24 2327 03/31/24 0357 03/31/24 0804 03/31/24 1149  GLUCAP 192* 94 153* 143* 195*    Discharge time spent: 38 minutes.  Length of inpatient stay: 1 days  Signed: Carliss LELON Canales, DO [facility] 03/31/2024

## 2024-03-31 NOTE — Progress Notes (Signed)
 Needs taxi but doesn't have walker with him... TOC [place]) working on it

## 2024-03-31 NOTE — TOC CM/SW Note (Signed)
 Transition of Care [facility]) - Inpatient Brief Assessment   Patient Details  Name: THUNDER BRIDGEWATER MRN: [id number] Date of Birth: 1931/05/09  Transition of Care [facility]) CM/SW Contact:    Corean ONEIDA Haddock, RN Phone Number: 03/31/2024, 2:07 PM   Clinical Narrative:  Transition of Care [facility]) Screening Note   Patient Details  Name: CLEMENT DENEAULT Date of Birth: 09/13/1930   Transition of Care [facility]) CM/SW Contact:    Corean ONEIDA Haddock, RN Phone Number: 03/31/2024, 2:07 PM    Transition of Care Department [facility]) has reviewed patient and no TOC needs have been identified at this time.  If new patient transition needs arise, please place a TOC consult.  Taxi voucher placed on chart   Transition of Care Asessment: Insurance and Status: Insurance coverage has been reviewed Patient has primary care physician: Yes     Prior/Current Home Services: No current home services Social Drivers of Health Review: SDOH reviewed no interventions necessary Readmission risk has been reviewed: Yes Transition of care needs: no transition of care needs at this time

## 2024-03-31 NOTE — Progress Notes (Signed)
 Pharmacy Antibiotic Note  George Hensley is a 89 y.o. male admitted on 03/30/2024 with septic shock/pneumonia.  Pharmacy has been consulted for ceftazidime  dosing. Renal function impaired with Scr 1.32 mg/dL (CrCl 73.5 mL/min) that is trending down. WBC elevated at 17.2 K/uL and patient is afebrile (Temp 98 F). Currently on nasal canula 2 L/min. Vitals stable with last MAP of 73 and HR 78 in NSR without vasopressors. Notable history of pseudomonal infections per [facility] chart review. MRSA PCR negative. Currently receiving ceftriaxone  and azithromycin  (started 8/4).   Scr improved 1.32>>1.21>>1.03  Plan: Will adjust ceftazidime  from 2 gm q24h to q12h,  as standard 30 minute infusion for Crcl 33.8 ml/min  Continue to assess renal fxn, cultures, length of therapy, etc  Height: 5' 8 (172.7 cm) Weight: 50.1 kg (110 lb 7.2 oz) IBW/kg (Calculated) : 68.4  Temp (24hrs), Avg:97.7 F (36.5 C), Min:97.3 F (36.3 C), Max:98 F (36.7 C)  Recent Labs  Lab 03/30/24 0150 03/30/24 0237 03/30/24 0810 03/30/24 1752 03/31/24 0439  WBC 15.4*  --  17.2*  --   --   CREATININE 1.61*  --  1.32* 1.21 1.03  LATICACIDVEN  --  1.3  --   --   --     Estimated Creatinine Clearance: 33.8 mL/min (by C-G formula based on SCr of 1.03 mg/dL).    Allergies  Allergen Reactions   Metoprolol     Pt states syncope with lopressor    Antimicrobials this admission: Azithromycin  500 mg IV q24h 8/4 >>  Ceftriaxone  2g IV x 1 (8/4)  Ceftazidime   8/4>>  Dose adjustments this admission: Discontinued ceftriaxone  and initiated ceftazidime .   Microbiology results: 8/4 BCx: (pending) no growth to date  8/4 MRSA PCR: Negative   Thank you for allowing pharmacy to be a part of this patient's care.  Allean Haas PharmD Clinical Pharmacist 03/31/2024

## 2024-04-01 LAB — LEGIONELLA PNEUMOPHILA SEROGP 1 UR AG: L. pneumophila Serogp 1 Ur Ag: NEGATIVE

## 2024-04-04 LAB — CULTURE, BLOOD (ROUTINE X 2)
Culture: NO GROWTH
Culture: NO GROWTH
Special Requests: ADEQUATE

## 2024-04-27 DEATH — deceased
